# Patient Record
Sex: Male | Born: 1945
Health system: Southern US, Community
[De-identification: ages and names within clinical notes are randomized; demographics above are authoritative.]

## PROBLEM LIST (undated history)

## (undated) DIAGNOSIS — K5790 Diverticulosis of intestine, part unspecified, without perforation or abscess without bleeding: Secondary | ICD-10-CM

## (undated) DIAGNOSIS — K219 Gastro-esophageal reflux disease without esophagitis: Secondary | ICD-10-CM

## (undated) DIAGNOSIS — T7840XA Allergy, unspecified, initial encounter: Secondary | ICD-10-CM

## (undated) DIAGNOSIS — I639 Cerebral infarction, unspecified: Secondary | ICD-10-CM

## (undated) DIAGNOSIS — M720 Palmar fascial fibromatosis [Dupuytren]: Secondary | ICD-10-CM

## (undated) DIAGNOSIS — I471 Supraventricular tachycardia: Secondary | ICD-10-CM

## (undated) DIAGNOSIS — I4719 Other supraventricular tachycardia: Secondary | ICD-10-CM

## (undated) DIAGNOSIS — D369 Benign neoplasm, unspecified site: Secondary | ICD-10-CM

## (undated) DIAGNOSIS — I10 Essential (primary) hypertension: Secondary | ICD-10-CM

## (undated) DIAGNOSIS — M1990 Unspecified osteoarthritis, unspecified site: Secondary | ICD-10-CM

## (undated) HISTORY — DX: Palmar fascial fibromatosis (dupuytren): M72.0

## (undated) HISTORY — DX: Other supraventricular tachycardia: I47.19

## (undated) HISTORY — DX: Allergy, unspecified, initial encounter: T78.40XA

## (undated) HISTORY — DX: Diverticulosis of intestine, part unspecified, without perforation or abscess without bleeding: K57.90

## (undated) HISTORY — PX: POLYPECTOMY: SHX149

## (undated) HISTORY — DX: Supraventricular tachycardia: I47.1

## (undated) HISTORY — DX: Benign neoplasm, unspecified site: D36.9

## (undated) HISTORY — PX: COLON SURGERY: SHX602

## (undated) HISTORY — DX: Unspecified osteoarthritis, unspecified site: M19.90

## (undated) HISTORY — DX: Cerebral infarction, unspecified: I63.9

## (undated) HISTORY — PX: CARDIAC CATHETERIZATION: SHX172

## (undated) HISTORY — PX: BACK SURGERY: SHX140

## (undated) HISTORY — DX: Gastro-esophageal reflux disease without esophagitis: K21.9

## (undated) HISTORY — DX: Essential (primary) hypertension: I10

---

## 2000-09-04 HISTORY — PX: LASIK: SHX215

## 2003-09-24 ENCOUNTER — Encounter: Admission: RE | Admit: 2003-09-24 | Discharge: 2003-09-24 | Payer: Self-pay | Admitting: Family Medicine

## 2004-08-23 ENCOUNTER — Ambulatory Visit: Payer: Self-pay | Admitting: Family Medicine

## 2004-10-19 ENCOUNTER — Ambulatory Visit: Payer: Self-pay | Admitting: Family Medicine

## 2005-07-11 ENCOUNTER — Ambulatory Visit: Payer: Self-pay | Admitting: Family Medicine

## 2005-07-12 ENCOUNTER — Ambulatory Visit: Payer: Self-pay | Admitting: Family Medicine

## 2005-09-19 ENCOUNTER — Ambulatory Visit: Payer: Self-pay | Admitting: Family Medicine

## 2005-10-03 ENCOUNTER — Ambulatory Visit: Payer: Self-pay | Admitting: Family Medicine

## 2006-07-18 ENCOUNTER — Ambulatory Visit: Payer: Self-pay | Admitting: Family Medicine

## 2006-07-18 LAB — CONVERTED CEMR LAB: PSA: 0.68 ng/mL

## 2006-08-02 ENCOUNTER — Ambulatory Visit: Payer: Self-pay | Admitting: Family Medicine

## 2006-08-15 LAB — FECAL OCCULT BLOOD, GUAIAC: Fecal Occult Blood: NEGATIVE

## 2006-08-17 ENCOUNTER — Ambulatory Visit: Payer: Self-pay | Admitting: Family Medicine

## 2006-12-26 ENCOUNTER — Encounter: Payer: Self-pay | Admitting: Family Medicine

## 2007-01-01 DIAGNOSIS — K5790 Diverticulosis of intestine, part unspecified, without perforation or abscess without bleeding: Secondary | ICD-10-CM | POA: Insufficient documentation

## 2007-01-01 DIAGNOSIS — Z8719 Personal history of other diseases of the digestive system: Secondary | ICD-10-CM

## 2007-01-01 DIAGNOSIS — I10 Essential (primary) hypertension: Secondary | ICD-10-CM | POA: Insufficient documentation

## 2007-01-01 DIAGNOSIS — K5730 Diverticulosis of large intestine without perforation or abscess without bleeding: Secondary | ICD-10-CM | POA: Insufficient documentation

## 2007-01-01 DIAGNOSIS — J309 Allergic rhinitis, unspecified: Secondary | ICD-10-CM | POA: Insufficient documentation

## 2007-07-23 ENCOUNTER — Ambulatory Visit: Payer: Self-pay | Admitting: Family Medicine

## 2007-07-24 LAB — CONVERTED CEMR LAB
ALT: 29 units/L (ref 0–53)
AST: 30 units/L (ref 0–37)
Albumin: 4 g/dL (ref 3.5–5.2)
BUN: 13 mg/dL (ref 6–23)
Basophils Relative: 0.9 % (ref 0.0–1.0)
Bilirubin, Direct: 0.1 mg/dL (ref 0.0–0.3)
CO2: 27 meq/L (ref 19–32)
Calcium: 9.2 mg/dL (ref 8.4–10.5)
Chloride: 104 meq/L (ref 96–112)
Cholesterol: 175 mg/dL (ref 0–200)
Creatinine, Ser: 1 mg/dL (ref 0.4–1.5)
Eosinophils Absolute: 0.2 10*3/uL (ref 0.0–0.6)
Eosinophils Relative: 3.2 % (ref 0.0–5.0)
GFR calc Af Amer: 98 mL/min
GFR calc non Af Amer: 81 mL/min
Glucose, Bld: 103 mg/dL — ABNORMAL HIGH (ref 70–99)
HCT: 43.4 % (ref 39.0–52.0)
Hemoglobin: 15.3 g/dL (ref 13.0–17.0)
LDL Cholesterol: 115 mg/dL — ABNORMAL HIGH (ref 0–99)
Lymphocytes Relative: 22.9 % (ref 12.0–46.0)
MCV: 87.9 fL (ref 78.0–100.0)
Monocytes Relative: 10.1 % (ref 3.0–11.0)
Neutro Abs: 3.5 10*3/uL (ref 1.4–7.7)
Neutrophils Relative %: 62.9 % (ref 43.0–77.0)
PSA: 0.64 ng/mL (ref 0.10–4.00)
Platelets: 244 10*3/uL (ref 150–400)
Potassium: 4.4 meq/L (ref 3.5–5.1)
RBC: 4.93 M/uL (ref 4.22–5.81)
RDW: 11.8 % (ref 11.5–14.6)
Sodium: 140 meq/L (ref 135–145)
TSH: 0.52 microintl units/mL (ref 0.35–5.50)
Total Bilirubin: 0.8 mg/dL (ref 0.3–1.2)
Total CHOL/HDL Ratio: 3.5
Total Protein: 6.8 g/dL (ref 6.0–8.3)
Triglycerides: 51 mg/dL (ref 0–149)
VLDL: 10 mg/dL (ref 0–40)
WBC: 5.4 10*3/uL (ref 4.5–10.5)

## 2008-03-02 ENCOUNTER — Ambulatory Visit: Payer: Self-pay | Admitting: Gastroenterology

## 2008-03-16 ENCOUNTER — Ambulatory Visit: Payer: Self-pay | Admitting: Gastroenterology

## 2008-03-16 ENCOUNTER — Encounter: Payer: Self-pay | Admitting: Gastroenterology

## 2008-03-18 ENCOUNTER — Encounter: Payer: Self-pay | Admitting: Gastroenterology

## 2008-04-30 ENCOUNTER — Encounter: Payer: Self-pay | Admitting: Family Medicine

## 2008-07-11 ENCOUNTER — Encounter: Payer: Self-pay | Admitting: Physician Assistant

## 2008-09-04 HISTORY — PX: COLECTOMY: SHX59

## 2008-09-23 ENCOUNTER — Ambulatory Visit: Payer: Self-pay | Admitting: Family Medicine

## 2008-09-23 DIAGNOSIS — K644 Residual hemorrhoidal skin tags: Secondary | ICD-10-CM | POA: Insufficient documentation

## 2008-09-24 LAB — CONVERTED CEMR LAB
ALT: 26 units/L (ref 0–53)
AST: 23 units/L (ref 0–37)
Albumin: 4.2 g/dL (ref 3.5–5.2)
Alkaline Phosphatase: 63 units/L (ref 39–117)
BUN: 15 mg/dL (ref 6–23)
Basophils Relative: 0.8 % (ref 0.0–3.0)
Bilirubin, Direct: 0.1 mg/dL (ref 0.0–0.3)
CO2: 31 meq/L (ref 19–32)
Calcium: 9.4 mg/dL (ref 8.4–10.5)
Chloride: 106 meq/L (ref 96–112)
Cholesterol: 180 mg/dL (ref 0–200)
Creatinine, Ser: 0.9 mg/dL (ref 0.4–1.5)
Eosinophils Relative: 5 % (ref 0.0–5.0)
GFR calc Af Amer: 110 mL/min
GFR calc non Af Amer: 91 mL/min
Glucose, Bld: 97 mg/dL (ref 70–99)
HCT: 44.3 % (ref 39.0–52.0)
HDL: 48.2 mg/dL (ref >39.0)
Hemoglobin: 15.5 g/dL (ref 13.0–17.0)
LDL Cholesterol: 118 mg/dL — ABNORMAL HIGH (ref 0–99)
Lymphocytes Relative: 26.4 % (ref 12.0–46.0)
Monocytes Absolute: 0.6 10*3/uL (ref 0.1–1.0)
Monocytes Relative: 10.3 % (ref 3.0–12.0)
PSA: 0.63 ng/mL (ref 0.10–4.00)
Platelets: 229 10*3/uL (ref 150–400)
Potassium: 4.3 meq/L (ref 3.5–5.1)
RDW: 12.2 % (ref 11.5–14.6)
Sodium: 140 meq/L (ref 135–145)
Total Bilirubin: 1 mg/dL (ref 0.3–1.2)
Total CHOL/HDL Ratio: 3.7
Total Protein: 7.2 g/dL (ref 6.0–8.3)
Triglycerides: 67 mg/dL (ref 0–149)
VLDL: 13 mg/dL (ref 0–40)
WBC: 5.6 10*3/uL (ref 4.5–10.5)

## 2008-10-26 ENCOUNTER — Ambulatory Visit: Payer: Self-pay | Admitting: Gastroenterology

## 2008-10-26 ENCOUNTER — Encounter (INDEPENDENT_AMBULATORY_CARE_PROVIDER_SITE_OTHER): Payer: Self-pay | Admitting: *Deleted

## 2008-10-26 ENCOUNTER — Telehealth: Payer: Self-pay | Admitting: Gastroenterology

## 2008-10-26 DIAGNOSIS — Z860100 Personal history of colon polyps, unspecified: Secondary | ICD-10-CM | POA: Insufficient documentation

## 2008-10-26 LAB — CONVERTED CEMR LAB
Basophils Absolute: 0.3 10*3/uL — ABNORMAL HIGH (ref 0.0–0.1)
Basophils Relative: 3.2 % — ABNORMAL HIGH (ref 0.0–3.0)
Eosinophils Absolute: 0.2 10*3/uL (ref 0.0–0.7)
Eosinophils Relative: 3 % (ref 0.0–5.0)
HCT: 45.6 % (ref 39.0–52.0)
Hemoglobin: 15.9 g/dL (ref 13.0–17.0)
Lymphocytes Relative: 20.2 % (ref 12.0–46.0)
MCHC: 34.8 g/dL (ref 30.0–36.0)
MCV: 90.4 fL (ref 78.0–100.0)
Monocytes Absolute: 0.7 10*3/uL (ref 0.1–1.0)
Neutro Abs: 5.2 10*3/uL (ref 1.4–7.7)
Platelets: 225 10*3/uL (ref 150–400)
RBC: 5.04 M/uL (ref 4.22–5.81)
RDW: 12.4 % (ref 11.5–14.6)

## 2008-11-16 ENCOUNTER — Ambulatory Visit: Payer: Self-pay | Admitting: Gastroenterology

## 2009-01-04 ENCOUNTER — Ambulatory Visit: Payer: Self-pay | Admitting: Gastroenterology

## 2009-01-04 ENCOUNTER — Ambulatory Visit: Payer: Self-pay | Admitting: Cardiology

## 2009-01-04 ENCOUNTER — Telehealth: Payer: Self-pay | Admitting: Gastroenterology

## 2009-01-04 LAB — CONVERTED CEMR LAB
BUN: 12 mg/dL (ref 6–23)
Creatinine, Ser: 1 mg/dL (ref 0.4–1.5)

## 2009-01-08 ENCOUNTER — Encounter: Payer: Self-pay | Admitting: Family Medicine

## 2009-01-14 ENCOUNTER — Telehealth: Payer: Self-pay | Admitting: Gastroenterology

## 2009-02-03 ENCOUNTER — Encounter: Admission: RE | Admit: 2009-02-03 | Discharge: 2009-02-03 | Payer: Self-pay | Admitting: Family Medicine

## 2009-02-25 ENCOUNTER — Ambulatory Visit: Payer: Self-pay | Admitting: Gastroenterology

## 2009-03-31 ENCOUNTER — Encounter: Payer: Self-pay | Admitting: Gastroenterology

## 2009-04-06 ENCOUNTER — Encounter: Admission: RE | Admit: 2009-04-06 | Discharge: 2009-04-06 | Payer: Self-pay | Admitting: General Surgery

## 2009-04-20 ENCOUNTER — Encounter: Payer: Self-pay | Admitting: Family Medicine

## 2009-06-07 ENCOUNTER — Telehealth: Payer: Self-pay | Admitting: Gastroenterology

## 2009-07-06 ENCOUNTER — Encounter: Payer: Self-pay | Admitting: Family Medicine

## 2009-07-07 ENCOUNTER — Encounter: Admission: RE | Admit: 2009-07-07 | Discharge: 2009-07-07 | Payer: Self-pay | Admitting: General Surgery

## 2009-07-16 ENCOUNTER — Encounter: Payer: Self-pay | Admitting: Family Medicine

## 2009-08-03 ENCOUNTER — Encounter: Payer: Self-pay | Admitting: Gastroenterology

## 2009-08-03 ENCOUNTER — Inpatient Hospital Stay (HOSPITAL_COMMUNITY): Admission: RE | Admit: 2009-08-03 | Discharge: 2009-08-08 | Payer: Self-pay | Admitting: General Surgery

## 2009-08-03 ENCOUNTER — Encounter (INDEPENDENT_AMBULATORY_CARE_PROVIDER_SITE_OTHER): Payer: Self-pay | Admitting: General Surgery

## 2009-08-11 ENCOUNTER — Encounter: Payer: Self-pay | Admitting: Gastroenterology

## 2009-09-06 ENCOUNTER — Encounter: Payer: Self-pay | Admitting: Family Medicine

## 2009-09-24 ENCOUNTER — Ambulatory Visit: Admission: RE | Admit: 2009-09-24 | Discharge: 2009-09-24 | Payer: Self-pay | Admitting: Family Medicine

## 2009-09-24 ENCOUNTER — Ambulatory Visit: Payer: Self-pay | Admitting: Vascular Surgery

## 2009-09-24 ENCOUNTER — Encounter: Payer: Self-pay | Admitting: Family Medicine

## 2009-10-13 ENCOUNTER — Inpatient Hospital Stay (HOSPITAL_COMMUNITY): Admission: EM | Admit: 2009-10-13 | Discharge: 2009-10-17 | Payer: Self-pay | Admitting: Emergency Medicine

## 2009-10-28 ENCOUNTER — Ambulatory Visit: Payer: Self-pay | Admitting: Family Medicine

## 2009-10-28 DIAGNOSIS — N509 Disorder of male genital organs, unspecified: Secondary | ICD-10-CM | POA: Insufficient documentation

## 2009-11-04 ENCOUNTER — Encounter: Payer: Self-pay | Admitting: Family Medicine

## 2009-11-26 ENCOUNTER — Ambulatory Visit: Payer: Self-pay | Admitting: Family Medicine

## 2009-11-26 DIAGNOSIS — M79609 Pain in unspecified limb: Secondary | ICD-10-CM | POA: Insufficient documentation

## 2009-11-26 DIAGNOSIS — M25519 Pain in unspecified shoulder: Secondary | ICD-10-CM | POA: Insufficient documentation

## 2009-11-29 ENCOUNTER — Encounter: Admission: RE | Admit: 2009-11-29 | Discharge: 2009-11-29 | Payer: Self-pay | Admitting: Family Medicine

## 2009-11-29 LAB — CONVERTED CEMR LAB
ALT: 21 units/L (ref 0–53)
AST: 20 units/L (ref 0–37)
Albumin: 4.1 g/dL (ref 3.5–5.2)
Alkaline Phosphatase: 66 units/L (ref 39–117)
BUN: 14 mg/dL (ref 6–23)
Basophils Absolute: 0.1 10*3/uL (ref 0.0–0.1)
Basophils Relative: 1.3 % (ref 0.0–3.0)
Bilirubin, Direct: 0.1 mg/dL (ref 0.0–0.3)
CO2: 30 meq/L (ref 19–32)
Calcium: 9.4 mg/dL (ref 8.4–10.5)
Chloride: 108 meq/L (ref 96–112)
Cholesterol: 195 mg/dL (ref 0–200)
Creatinine, Ser: 0.9 mg/dL (ref 0.4–1.5)
Eosinophils Absolute: 0.2 10*3/uL (ref 0.0–0.7)
Eosinophils Relative: 3.9 % (ref 0.0–5.0)
GFR calc non Af Amer: 90.85 mL/min (ref >60)
Glucose, Bld: 91 mg/dL (ref 70–99)
HCT: 44.8 % (ref 39.0–52.0)
HDL: 58 mg/dL (ref >39.00)
Hemoglobin: 14.7 g/dL (ref 13.0–17.0)
LDL Cholesterol: 121 mg/dL — ABNORMAL HIGH (ref 0–99)
Lymphocytes Relative: 28.5 % (ref 12.0–46.0)
Lymphs Abs: 1.5 10*3/uL (ref 0.7–4.0)
MCHC: 32.9 g/dL (ref 30.0–36.0)
MCV: 91.6 fL (ref 78.0–100.0)
Monocytes Absolute: 0.4 10*3/uL (ref 0.1–1.0)
Monocytes Relative: 8.3 % (ref 3.0–12.0)
Neutro Abs: 3 10*3/uL (ref 1.4–7.7)
Neutrophils Relative %: 58 % (ref 43.0–77.0)
PSA: 0.91 ng/mL (ref 0.10–4.00)
Platelets: 300 10*3/uL (ref 150.0–400.0)
Potassium: 4.6 meq/L (ref 3.5–5.1)
RBC: 4.89 M/uL (ref 4.22–5.81)
RDW: 11.8 % (ref 11.5–14.6)
Sodium: 141 meq/L (ref 135–145)
TSH: 0.66 microintl units/mL (ref 0.35–5.50)
Total Bilirubin: 0.3 mg/dL (ref 0.3–1.2)
Total CHOL/HDL Ratio: 3
Total Protein: 7.1 g/dL (ref 6.0–8.3)
Triglycerides: 82 mg/dL (ref 0.0–149.0)
VLDL: 16.4 mg/dL (ref 0.0–40.0)
WBC: 5.2 10*3/uL (ref 4.5–10.5)

## 2009-11-30 ENCOUNTER — Telehealth: Payer: Self-pay | Admitting: Family Medicine

## 2009-12-06 ENCOUNTER — Encounter: Payer: Self-pay | Admitting: Family Medicine

## 2009-12-30 ENCOUNTER — Encounter: Payer: Self-pay | Admitting: Gastroenterology

## 2010-02-03 ENCOUNTER — Telehealth: Payer: Self-pay | Admitting: Family Medicine

## 2010-02-03 ENCOUNTER — Encounter: Payer: Self-pay | Admitting: Family Medicine

## 2010-02-11 ENCOUNTER — Telehealth: Payer: Self-pay | Admitting: Family Medicine

## 2010-03-09 ENCOUNTER — Ambulatory Visit: Payer: Self-pay | Admitting: Family Medicine

## 2010-03-18 ENCOUNTER — Ambulatory Visit: Payer: Self-pay | Admitting: Family Medicine

## 2010-03-18 DIAGNOSIS — L259 Unspecified contact dermatitis, unspecified cause: Secondary | ICD-10-CM | POA: Insufficient documentation

## 2010-03-23 ENCOUNTER — Telehealth: Payer: Self-pay | Admitting: Family Medicine

## 2010-10-06 NOTE — Progress Notes (Signed)
Summary: Blood pressure issues..wants  to change meds   Phone Note Call from Patient   Caller: Patient Call For: Robert Page Summary of Call: Pt called, Says he is presently on Losartan for his blood pressure. Before changing he was on Benicar. He needs a precription for Benicare, because the Losartan  is no longer  working. Pt had no b/p reading , says he has not taken his blood pressure since his last visit in the office.  Told pt the nurse would be in contact w/ him. Call back # [phone].Daine Gip  March 23, 2010 12:48 PM  Initial call taken by: Daine Gip,  March 23, 2010 12:48 PM  Follow-up for Phone Call        will go back to benicar px written on EMR for call in  Follow-up by: Robert Page,  March 23, 2010 1:36 PM  Additional Follow-up for Phone Call Additional follow up Details #1::        Advised pt, med sent to pharmacy. Additional Follow-up by: Lowella Petties CMA,  March 23, 2010 4:17 PM   New Allergies: ! * LOSARTAN New Allergies: ! * LOSARTANPrescriptions: BENICAR 20 MG TABS (OLMESARTAN MEDOXOMIL) Take one by mouth daily  #30 x 11   Entered by:   Lowella Petties CMA   Authorized by:   Robert Page   Signed by:   Lowella Petties CMA on 03/23/2010   Method used:   [facility]        [id number]       [address]       [place], [place]  [address]       Ph: [phone]       Fax: [phone]   RxID:   [id number]

## 2010-10-06 NOTE — Miscellaneous (Signed)
Summary: Losartan50mg  updated instructions on med list  Medications Added LOSARTAN POTASSIUM 50 MG TABS (LOSARTAN POTASSIUM) Take 2  tablets  by mouth once a day       Clinical Lists Changes  Medications: Changed medication from LOSARTAN POTASSIUM 50 MG TABS (LOSARTAN POTASSIUM) Take 1 tablet by mouth once a day to LOSARTAN POTASSIUM 50 MG TABS (LOSARTAN POTASSIUM) Take 2  tablets  by mouth once a day     Current Allergies: ! SULFA ! MOTRIN ! * ALTASE

## 2010-10-06 NOTE — Progress Notes (Signed)
Summary: Lower Cost Alternative Rx.  Phone Note From Pharmacy   [id number] Phone  [phone] Call For: Dr. Milinda Antis  Summary of Call: Form received asking for a lower cost alternative to Benicar 20 mg. once daily.  Pharmacy recommends Losartan Potassium 25, 50 or 100 mg.  Form in your in box.  [facility] [address]  Fax:   [phone] Initial call taken by: Delilah Shan CMA Duncan Dull),  February 03, 2010 9:02 AM  Follow-up for Phone Call        I am fine with change to losartan 50 -- only if pt wants to do it  sched nurse visit for bp check 2 wk after starting it to make sure it is working  if he is agreeable --please change this on EMR form done and in nurse in box  Follow-up by: Judith Part MD,  February 03, 2010 12:33 PM  Additional Follow-up for Phone Call Additional follow up Details #1::        Patient notified as instructed by telephone. Pt wants to try since will save money. Pt scheduled nurse visit BP check on 03/09/10 at 8:30. Pt still has 2 weeks of Benicar he will take before starting new med. completed form faxed to [phone]. Med list updated as instructed.Lewanda Rife LPN  February 04, 8656 12:49 PM

## 2010-10-06 NOTE — Assessment & Plan Note (Signed)
Summary: BP CHECK PER DR Malina Geers/RI   Nurse Visit   Vital Signs:  Patient profile:   66 year old male Weight:      202 pounds Pulse rate:   64 / minute Pulse rhythm:   regular BP sitting:   150 / 100  (right arm)  Vitals Entered By: Lowella Petties CMA (March 09, 2010 9:01 AM) CC: Nurse visit-  BP check.  Pt has been on benicar for 3 weeks.   Allergies: 1)  ! Sulfa 2)  ! Motrin 3)  ! * Altase  Orders Added: 1)  Est. Patient Level I [[address]]  Appended Document: BP CHECK PER DR Mahaley Schwering/RI noted that pt had been on benicar for 3 weeks -- did you mean off benecar and on cozaar- this visit was to check bp after switching to cozaar which is less expensive-- please update me   Appended Document: BP CHECK PER DR Rachelann Enloe/[place] Pt states he started losartan about 3 weeks ago, when he stopped benicar.  Appended Document: BP CHECK PER DR Malaquias Lenker/RI it is not working as well so up the dose to 100 mg (2 pills once daily) and re check bp in nurse visit in 1-2 weeks -- if not improved may need to go with something else (sadly sometimes the cheaper alternative just does not cut it) please note dose change on EMR thanks for the update  Appended Document: BP CHECK PER DR Oza Oberle/RI Patient notified as instructed by telephone. EMR updated with Losartan directions as instructed. Pt is outside and will call back to schedule nurse visit for BP.

## 2010-10-06 NOTE — Assessment & Plan Note (Signed)
Summary: CPX/CLE   Vital Signs:  Patient profile:   65 year old male Height:      68 inches Weight:      201 pounds BMI:     30.67 Temp:     97.9 degrees F oral Pulse rate:   64 / minute Pulse rhythm:   regular BP sitting:   120 / 74  (left arm) Cuff size:   regular  Vitals Entered By: Lewanda Rife LPN (October 28, 2009 9:24 AM)  History of Present Illness: here for health mt exam and to rev chronic med problems   wt is down 2 lb with bmi 30 started back eating - had lost 20 lb in the hospital  he missed his yearly trip to [place]   had recent hosp - colectomy for diverticulosis and then infarcted  appendix epiploica unlikely diverticulitis  is doing better now  goes back to Dr Derrell Lolling on march 4th -- may still go ahead and re image is generally feeling good today   sinuses act up if he stops his allegra - needs refil   had zoster in past  TD 05   colonosc 7/09- polyp rec f/u 5 y   psa and other labs are due  bp is good today 120/74  prostate-- no problems  some testicular pain ever since his surgery  is generally getting better  no bulge or hernia known  no trouble urinating at all  nocturia once per night maximum - sometimes none  does not want dre yet today until he is healed a bit more from surgery- rectum is still sore   is getting more active   derm visit this summer - all moles removed were ok   Allergies: 1)  ! Sulfa 2)  ! Motrin 3)  ! Lucila Maine  Past History:  Past Medical History: Last updated: 08/03/2009 Allergic rhinitis Diverticulitis, hx of Diverticulosis, colon Hypertension RLL infiltrate Dupuytren's dz- palms and soles ADENOMATOUS POLYP   derm- [facility]-- surg -  Dr Derrell Lolling  Past Surgical History: Last updated: 08/03/2009 00 flex sig 02 lasik eye sx 04 colonosc diverticulosis 1/07 zoster 7/09 colonoscopy polyp/diverticulosis  11/10 L colectomy for diverticulitis   Family History: Last updated: 10/28/2009 sister with  melanoma parents HTN mother macular degeneration uncle with CVA uncle prostate ca uncle colon ca  uncle lung ca GM colon ca cousin stomach ca Family History of Kidney Disease: Grandmother, Mother Family History of Heart Disease: Cousin, Aunt, Uncle Family History of Liver Cancer: Uncle (mets) sister's grandaughter has sarcoma   Social History: Last updated: 02/25/2009 Former Smoker-stopped 6 months ago occ cigars- not often goes on mission trips to [place] Alcohol Use - no Daily Caffeine Use-2 cups daily Illicit Drug Use - no Patient gets regular exercise.  Risk Factors: Exercise: yes (02/25/2009)  Risk Factors: Smoking Status: quit (12/26/2006)  Family History: sister with melanoma parents HTN mother macular degeneration uncle with CVA uncle prostate ca uncle colon ca  uncle lung ca GM colon ca cousin stomach ca Family History of Kidney Disease: Grandmother, Mother Family History of Heart Disease: Cousin, Aunt, Uncle Family History of Liver Cancer: Uncle (mets) sister's grandaughter has sarcoma   Review of Systems General:  Denies fatigue, fever, loss of appetite, and malaise. Eyes:  Denies blurring and eye irritation. CV:  Denies chest pain or discomfort and lightheadness. Resp:  Denies cough, shortness of breath, and wheezing. GI:  Denies abdominal pain, bloody stools, change in bowel habits, indigestion, nausea, and  vomiting. GU:  Denies discharge, dysuria, hematuria, and urinary frequency. MS:  Denies joint pain. Derm:  Denies itching, lesion(s), poor wound healing, and rash. Neuro:  Denies numbness and tingling. Psych:  mood is generally ok . Endo:  Denies cold intolerance, excessive thirst, excessive urination, and heat intolerance. Heme:  Denies abnormal bruising and bleeding.  Physical Exam  General:  Well-developed,well-nourished,in no acute distress; alert,appropriate and cooperative throughout examination Head:  normocephalic, atraumatic, and  no abnormalities observed.   Eyes:  vision grossly intact, pupils equal, pupils round, and pupils reactive to light.  no conjunctival pallor, injection or icterus  Ears:  R ear normal and L ear normal.  - scant cerumen Nose:  no nasal discharge.   Mouth:  pharynx pink and moist.   Neck:  supple with full rom and no masses or thyromegally, no JVD or carotid bruit  Chest Wall:  No deformities, masses, tenderness or gynecomastia noted. Lungs:  Normal respiratory effort, chest expands symmetrically. Lungs are clear to auscultation, no crackles or wheezes. Heart:  Normal rate and regular rhythm. S1 and S2 normal without gallop, murmur, click, rub or other extra sounds. Abdomen:  midline vertical incision - slt tender overall soft no rebound or gaurding  Rectal:  def today due to recent healing rectal incision  Genitalia:  Testes bilaterally descended without nodularity, tenderness or masses. No scrotal masses or lesions. No penis lesions or urethral discharge. no hernias noted  Skin:  Intact without suspicious lesions or rashes Cervical Nodes:  No lymphadenopathy noted Inguinal Nodes:  No significant adenopathy Psych:  normal affect, talkative and pleasant    Impression & Recommendations:  Problem # 1:  HEALTH MAINTENANCE EXAM (ICD-V70.0) Assessment Comment Only reviewed health habits including diet, exercise and skin cancer prevention reviewed health maintenance list and family history labs today  def rectal today due to recent surgery and healing Orders: Venipuncture ([address]) TLB-Lipid Panel (80061-LIPID) TLB-BMP (Basic Metabolic Panel-BMET) (80048-METABOL) TLB-CBC Platelet - w/Differential (85025-CBCD) TLB-Hepatic/Liver Function Pnl (80076-HEPATIC) TLB-TSH (Thyroid Stimulating Hormone) (84443-TSH) TLB-PSA (Prostate Specific Antigen) (84153-PSA)  Problem # 2:  HYPERTENSION (ICD-401.9) Assessment: Unchanged  bp is in good control with benicar  gradually adv exercise as tol no  change  lab today His updated medication list for this problem includes:    Benicar 20 Mg Tabs (Olmesartan medoxomil) .Marland Kitchen... Take one by mouth daily  Orders: Venipuncture ([address]) TLB-Lipid Panel (80061-LIPID) TLB-BMP (Basic Metabolic Panel-BMET) (80048-METABOL) TLB-CBC Platelet - w/Differential (85025-CBCD) TLB-Hepatic/Liver Function Pnl (80076-HEPATIC) TLB-TSH (Thyroid Stimulating Hormone) (84443-TSH) TLB-PSA (Prostate Specific Antigen) (84153-PSA)  BP today: 120/74 Prior BP: 114/68 (02/25/2009)  Labs Reviewed: K+: 4.3 (09/23/2008) Creat: : 1.0 (01/04/2009)   Chol: 180 (09/23/2008)   HDL: 48.2 (09/23/2008)   LDL: 118 (09/23/2008)   TG: 67 (09/23/2008)  Problem # 3:  DIVERTICULOSIS, COLON (ICD-562.10) Assessment: Comment Only imp now s/p colectomy - for surg f/u soon  Problem # 4:  TESTICULAR PAIN (ICD-608.9) Assessment: New ever since his GI surg -now gradually improved  no hernia noted - nl exam  suspect some referred pain - but will watch carefully  Problem # 5:  SPECIAL SCREENING MALIGNANT NEOPLASM OF PROSTATE (ICD-V76.44) Assessment: Comment Only psa today  will put off dre Orders: Venipuncture (09811) TLB-Lipid Panel (80061-LIPID) TLB-BMP (Basic Metabolic Panel-BMET) (80048-METABOL) TLB-CBC Platelet - w/Differential (85025-CBCD) TLB-Hepatic/Liver Function Pnl (80076-HEPATIC) TLB-TSH (Thyroid Stimulating Hormone) (84443-TSH) TLB-PSA (Prostate Specific Antigen) (84153-PSA)  Complete Medication List: 1)  Benicar 20 Mg Tabs (Olmesartan medoxomil) .... Take one by mouth daily 2)  Allegra 180 Mg Tabs (Fexofenadine hcl) .... Take one by mouth daily as needed 3)  Anusol-hc 2.5 % Crea (Hydrocortisone) .... Use as needed 4)  Aleve 220 Mg Tabs (Naproxen sodium) .... Otc as directed.  Patient Instructions: 1)  gradually get back to regular activity and high fiber diet and good water intake  2)  no change in medicines 3)  labs today  4)  update me if testicular pain does  not further improve in a month  Prescriptions: ALLEGRA 180 MG TABS (FEXOFENADINE HCL) Take one by mouth daily as needed  #90 x 3   Entered and Authorized by:   Judith Part MD   Signed by:   Judith Part MD on 10/28/2009   Method used:   Print then Give to Patient   RxID:   [id number] BENICAR 20 MG TABS (OLMESARTAN MEDOXOMIL) Take one by mouth daily  #90 x 3   Entered and Authorized by:   Judith Part MD   Signed by:   Judith Part MD on 10/28/2009   Method used:   Print then Give to Patient   RxID:   [id number]   Current Allergies (reviewed today): ! SULFA ! MOTRIN ! * ALTASE

## 2010-10-06 NOTE — Letter (Signed)
Summary: [facility]   Imported By: Lanelle Bal 11/16/2009 08:17:10  _____________________________________________________________________  External Attachment:    Type:   Image     Comment:   External Document

## 2010-10-06 NOTE — Letter (Signed)
Summary: [facility]   Imported By: Sherian Rein 01/11/2010 09:39:42  _____________________________________________________________________  External Attachment:    Type:   Image     Comment:   External Document

## 2010-10-06 NOTE — Assessment & Plan Note (Signed)
Summary: hand pain/nt   Vital Signs:  Patient profile:   65 year old male Height:      68 inches Weight:      205.75 pounds BMI:     31.40 Temp:     97.8 degrees F oral Pulse rate:   72 / minute Pulse rhythm:   regular BP sitting:   132 / 90  (left arm) Cuff size:   regular  Vitals Entered By: Linde Gillis CMA Duncan Dull) (November 26, 2009 11:21 AM) CC: hand pain   History of Present Illness: has always had trouble gripping with L hand  now much worse -- cannot close in ams  R is getting worse too  joints in it really hurt and are very stiff worse after driving or inactivity (and in the ams)  is also having pain in L shoulder -- after swinging over his head -- is sore and tender to the touch   no other arthritis known - but suspects it  runs in family no rhrum dz in family   has taken some aleve- does not help a whole lot -- 2 pills daily    Allergies: 1)  ! Sulfa 2)  ! Motrin 3)  ! Lucila Maine  Past History:  Past Medical History: Last updated: 08/03/2009 Allergic rhinitis Diverticulitis, hx of Diverticulosis, colon Hypertension RLL infiltrate Dupuytren's dz- palms and soles ADENOMATOUS POLYP   derm- [facility]-- surg -  Dr Derrell Lolling  Past Surgical History: Last updated: 08/03/2009 00 flex sig 02 lasik eye sx 04 colonosc diverticulosis 1/07 zoster 7/09 colonoscopy polyp/diverticulosis  11/10 L colectomy for diverticulitis   Family History: Last updated: 11/26/2009 sister with melanoma parents HTN mother macular degeneration brother - inflammatory lung and eye condtion  uncle with CVA uncle prostate ca uncle colon ca  uncle lung ca GM colon ca cousin stomach ca Family History of Kidney Disease: Grandmother, Mother Family History of Heart Disease: Cousin, Aunt, Uncle Family History of Liver Cancer: Uncle (mets) sister's grandaughter has sarcoma   Social History: Last updated: 02/25/2009 Former Smoker-stopped 6 months ago occ cigars- not  often goes on mission trips to [place] Alcohol Use - no Daily Caffeine Use-2 cups daily Illicit Drug Use - no Patient gets regular exercise.  Risk Factors: Exercise: yes (02/25/2009)  Risk Factors: Smoking Status: quit (12/26/2006)  Family History: sister with melanoma parents HTN mother macular degeneration brother - inflammatory lung and eye condtion  uncle with CVA uncle prostate ca uncle colon ca  uncle lung ca GM colon ca cousin stomach ca Family History of Kidney Disease: Grandmother, Mother Family History of Heart Disease: Cousin, Aunt, Uncle Family History of Liver Cancer: Uncle (mets) sister's grandaughter has sarcoma   Review of Systems General:  Denies chills, fatigue, fever, and malaise. Eyes:  Denies blurring and eye irritation. CV:  Denies chest pain or discomfort, lightheadness, palpitations, and shortness of breath with exertion. Resp:  Denies cough and wheezing. GI:  Denies abdominal pain and change in bowel habits. GU:  Denies discharge and dysuria. MS:  Complains of joint pain and stiffness; denies joint redness, joint swelling, muscle aches, cramps, and muscle weakness. Derm:  Denies itching, lesion(s), poor wound healing, and rash. Neuro:  Denies numbness and tingling. Heme:  Denies abnormal bruising and bleeding.  Physical Exam  General:  Well-developed,well-nourished,in no acute distress; alert,appropriate and cooperative throughout examination Head:  normocephalic, atraumatic, and no abnormalities observed.   Eyes:  vision grossly intact, pupils equal, pupils round, and pupils reactive to  light.   Mouth:  pharynx pink and moist.   Neck:  No deformities, masses, or tenderness noted. nl rom , no bony tenderness or crepitice  Chest Wall:  mildly tender L ant upper chest wall  Lungs:  Normal respiratory effort, chest expands symmetrically. Lungs are clear to auscultation, no crackles or wheezes. Heart:  Normal rate and regular rhythm. S1 and S2  normal without gallop, murmur, click, rub or other extra sounds. Msk:  L shoulder - full rom  some ant pain on hawking's and neer tests  tender acromion and deltoid areas  no swelling or deformity   hands- no deformity some tederness of mcps on L hand only nl grip with pain  no locking or triggering nl perfusion and sensation Extremities:  No clubbing, cyanosis, edema, or deformity noted with normal full range of motion of all joints.   Neurologic:  sensation intact to light touch, gait normal, and DTRs symmetrical and normal.   Skin:  Intact without suspicious lesions or rashes ruddy complexion Cervical Nodes:  No lymphadenopathy noted Psych:  normal affect, talkative and pleasant    Impression & Recommendations:  Problem # 1:  HAND PAIN, BILATERAL (ICD-729.5) Assessment New suspect osteoarthritis will try mobic 15 mg - watching closely for GI upset or bp elevation adv to take with food  disc use of warm water/ biofreeze and hand stretching  sent for x rays and then will make further plan  Orders: Radiology Referral (Radiology) Prescription Created Electronically [phone])  Problem # 2:  SHOULDER PAIN, LEFT (ICD-719.41) Assessment: New  acute after swinging an axe no impingement symptoms  recommend warm compress/ passive rom and mobic update 1 week if not imp relative rest- avoid over the head activities and heavy lifting with that arm  The following medications were removed from the medication list:    Aleve 220 Mg Tabs (Naproxen sodium) ..... Otc as directed. His updated medication list for this problem includes:    Mobic 15 Mg Tabs (Meloxicam) .Marland Kitchen... 1 by mouth once daily with food ( a meal ) as needed hand pain  Orders: Prescription Created Electronically [id number])  Complete Medication List: 1)  Benicar 20 Mg Tabs (Olmesartan medoxomil) .... Take one by mouth daily 2)  Allegra 180 Mg Tabs (Fexofenadine hcl) .... Take one by mouth daily as needed 3)  Mobic 15 Mg Tabs  (Meloxicam) .Marland Kitchen.. 1 by mouth once daily with food ( a meal ) as needed hand pain  Patient Instructions: 1)  we will send you for hand x rays at check out  2)  start mobic 15 mg daily with meal -- if any stomach upset stop it and call me  3)  use warm compress on shoulder area  4)  try warm or hot water soaks on hands when stiff 5)  biofreeze is ok  Prescriptions: MOBIC 15 MG TABS (MELOXICAM) 1 by mouth once daily with food ( a meal ) as needed hand pain  #30 x 3   Entered and Authorized by:   Judith Part MD   Signed by:   Judith Part MD on 11/26/2009   Method used:   [facility]        [facility] [address] [id number]       [address]       [place], [place]  [address]       Ph: [phone]  Fax: [phone]   RxID:   [id number]   Current Allergies (reviewed today): ! SULFA ! MOTRIN ! * ALTASE

## 2010-10-06 NOTE — Miscellaneous (Signed)
Summary: Losartan Potassium 50mg  rx updated med list  Medications Added LOSARTAN POTASSIUM 50 MG TABS (LOSARTAN POTASSIUM) Take 1 tablet by mouth once a day       Clinical Lists Changes  Medications: Added new medication of LOSARTAN POTASSIUM 50 MG TABS (LOSARTAN POTASSIUM) Take 1 tablet by mouth once a day     Current Allergies: ! SULFA ! MOTRIN ! * ALTASE

## 2010-10-06 NOTE — Assessment & Plan Note (Signed)
Summary: CONGESTION/COUGHING/DLO   Vital Signs:  Patient profile:   65 year old male Height:      68 inches Weight:      204.25 pounds BMI:     31.17 Temp:     97.9 degrees F oral Pulse rate:   64 / minute Pulse rhythm:   regular BP sitting:   154 / 94  (left arm) Cuff size:   regular  Vitals Entered By: Lewanda Rife LPN (March 18, 2010 11:46 AM) CC: BP check and head congested with pain in lt upper jaw, has sinus problems non productive cough and rash on stomach.   History of Present Illness: started getting respiratory symptoms - about 10 days ago - was in the mts  ever since then has had congestion  last week L side of teeth hurt (did see dentist )  coughing and congested  non prod  no colored nasal d/c   tried allegra D for allergies  has always taken the D and never had any problems with that with bp  changed to losartan for cost - bp is high was good on benicar   rash on his abdomen -- is better than it was itches like crazy  putting calamine on it  rash is not hurting      Allergies: 1)  ! Sulfa 2)  ! Motrin 3)  ! Lucila Maine  Past History:  Past Medical History: Last updated: 08/03/2009 Allergic rhinitis Diverticulitis, hx of Diverticulosis, colon Hypertension RLL infiltrate Dupuytren's dz- palms and soles ADENOMATOUS POLYP   derm- [facility]-- surg -  Dr Derrell Lolling  Past Surgical History: Last updated: 08/03/2009 00 flex sig 02 lasik eye sx 04 colonosc diverticulosis 1/07 zoster 7/09 colonoscopy polyp/diverticulosis  11/10 L colectomy for diverticulitis   Family History: Last updated: 11/26/2009 sister with melanoma parents HTN mother macular degeneration brother - inflammatory lung and eye condtion  uncle with CVA uncle prostate ca uncle colon ca  uncle lung ca GM colon ca cousin stomach ca Family History of Kidney Disease: Grandmother, Mother Family History of Heart Disease: Cousin, Aunt, Uncle Family History of Liver Cancer: Uncle  (mets) sister's grandaughter has sarcoma   Social History: Last updated: 02/25/2009 Former Smoker-stopped 6 months ago occ cigars- not often goes on mission trips to [place] Alcohol Use - no Daily Caffeine Use-2 cups daily Illicit Drug Use - no Patient gets regular exercise.  Risk Factors: Exercise: yes (02/25/2009)  Risk Factors: Smoking Status: quit (12/26/2006)  Review of Systems General:  Complains of fatigue; denies chills and fever. Eyes:  Denies blurring and eye irritation. ENT:  Complains of earache, nasal congestion, sinus pressure, and sore throat; denies ear discharge. CV:  Denies chest pain or discomfort and palpitations. Resp:  Complains of cough; denies wheezing. GI:  Denies abdominal pain, nausea, and vomiting. MS:  Denies cramps and muscle weakness. Derm:  Complains of itching and rash. Neuro:  Denies numbness and tingling. Endo:  Denies cold intolerance, excessive thirst, excessive urination, and heat intolerance. Heme:  Denies abnormal bruising and bleeding.  Physical Exam  General:  Well-developed,well-nourished,in no acute distress; alert,appropriate and cooperative throughout examination Head:  mild max sinus tenderness normocephalic, atraumatic, and no abnormalities observed.   Eyes:  vision grossly intact, pupils equal, pupils round, pupils reactive to light, and no injection.   Ears:  R ear normal and L ear normal.   Nose:  nares are injected and congested bilaterally  Mouth:  pharynx pink and moist, no erythema, and no  exudates.   Neck:  supple with full rom and no masses or thyromegally, no JVD or carotid bruit  Chest Wall:  No deformities, masses, tenderness or gynecomastia noted. Lungs:  harsh bs at bases no rales  few rhonchi Heart:  Normal rate and regular rhythm. S1 and S2 normal without gallop, murmur, click, rub or other extra sounds. Abdomen:  soft and non-tender.  see skin exam Msk:  No deformity or scoliosis noted of thoracic or  lumbar spine.   Extremities:  No clubbing, cyanosis, edema, or deformity noted with normal full range of motion of all joints.   Neurologic:  sensation intact to light touch, gait normal, and DTRs symmetrical and normal.   Skin:  several patches of vesicles on mid abdomen  no excoriations no pustules  Cervical Nodes:  No lymphadenopathy noted Inguinal Nodes:  No significant adenopathy Psych:  normal affect, talkative and pleasant    Impression & Recommendations:  Problem # 1:  HYPERTENSION (ICD-401.9) Assessment Deteriorated  this is worse with his change to losartan for cost will call ins to see which arb is covered and let me know ace causes cough His updated medication list for this problem includes:    Benicar 20 Mg Tabs (Olmesartan medoxomil) .Marland Kitchen... Take one by mouth daily    Losartan Potassium 50 Mg Tabs (Losartan potassium) .Marland Kitchen... Take 2  tablets  by mouth once a day  BP today: 154/94 Prior BP: 150/100 (03/09/2010)  Labs Reviewed: K+: 4.6 (10/28/2009) Creat: : 0.9 (10/28/2009)   Chol: 195 (10/28/2009)   HDL: 58.00 (10/28/2009)   LDL: 121 (10/28/2009)   TG: 82.0 (10/28/2009)  Orders: Prescription Created Electronically [id number])  Problem # 2:  BRONCHITIS- ACUTE (ICD-466.0) Assessment: New  with persistant cough and now some facial pain that could be from sinusitis  recommend sympt care- see pt instructions   zithromax - as directed and update if worse will call esp if cough/sob His updated medication list for this problem includes:    Nyquil 60-7.01-31-999 Mg/24ml Liqd (Pseudoeph-doxylamine-dm-apap) ..... Otc as directed.    Allegra-d 12 Hour 60-120 Mg Xr12h-tab (Fexofenadine-pseudoephedrine) ..... Otc as directed.    Zithromax Z-pak 250 Mg Tabs (Azithromycin) .Marland Kitchen... Take by mouth as directed  Orders: Prescription Created Electronically [phone])  Problem # 3:  CONTACT DERMATITIS&OTHER ECZEMA DUE UNSPEC CAUSE (ICD-692.9) Assessment: New  small spot on abd with itch  and help with calamine suspect poison ivy or oak given px for elicon cream update if worse or signs of infx  His updated medication list for this problem includes:    Allegra 180 Mg Tabs (Fexofenadine hcl) .Marland Kitchen... Take one by mouth daily as needed    Elocon 0.1 % Crea (Mometasone furoate) .Marland Kitchen... Apply to affected area once daily as needed (dermatitis)  Orders: Prescription Created Electronically [phone])  Complete Medication List: 1)  Benicar 20 Mg Tabs (Olmesartan medoxomil) .... Take one by mouth daily 2)  Allegra 180 Mg Tabs (Fexofenadine hcl) .... Take one by mouth daily as needed 3)  Mobic 15 Mg Tabs (Meloxicam) .Marland Kitchen.. 1 by mouth once daily with food ( a meal ) as needed hand pain 4)  Losartan Potassium 50 Mg Tabs (Losartan potassium) .... Take 2  tablets  by mouth once a day 5)  Nyquil 60-7.01-31-999 Mg/25ml Liqd (Pseudoeph-doxylamine-dm-apap) .... Otc as directed. 6)  Allegra-d 12 Hour 60-120 Mg Xr12h-tab (Fexofenadine-pseudoephedrine) .... Otc as directed. 7)  Zithromax Z-pak 250 Mg Tabs (Azithromycin) .... Take by mouth as directed 8)  Elocon 0.1 %  Crea (Mometasone furoate) .... Apply to affected area once daily as needed (dermatitis)  Patient Instructions: 1)  your losartan (generic cozaar) is not working - we need to put you back on benicar or other med like it  2)  let your insurance know that the cozaar does not work and in the past altace (ace inhibitor ) - makes you cough 3)  call me and let me know what you want me to do  4)  use elicon cream as directed  -let me know if your rash does not improve  5)  take the zithromax as directed for bronchitis and please let me know if not impoved or if worse  Prescriptions: ELOCON 0.1 % CREA (MOMETASONE FUROATE) apply to affected area once daily as needed (dermatitis)  #1 small x 0   Entered and Authorized by:   Judith Part MD   Signed by:   Judith Part MD on 03/18/2010   Method used:   Electronically to        [facility] [id number]       [address]       [place], [place]  [address]       Ph: [phone]       Fax: [phone]   RxID:   [id number] ZITHROMAX Z-PAK 250 MG TABS (AZITHROMYCIN) take by mouth as directed  #1 pack x 0   Entered and Authorized by:   Judith Part MD   Signed by:   Judith Part MD on 03/18/2010   Method used:   [facility] to        [facility]  [address] [id number]       [address]       [place], [place]  [address]       Ph: [phone]       Fax: [phone]   RxID:   [id number]   Current Allergies (reviewed today): ! SULFA ! MOTRIN ! * ALTASE

## 2010-10-06 NOTE — Letter (Signed)
Summary: [facility]   Imported By: Lanelle Bal 10/14/2009 10:45:10  _____________________________________________________________________  External Attachment:    Type:   Image     Comment:   External Document

## 2010-10-06 NOTE — Consult Note (Signed)
Summary: [facility]   Imported By: Maryln Gottron 12/13/2009 13:32:35  _____________________________________________________________________  External Attachment:    Type:   Image     Comment:   External Document

## 2010-10-06 NOTE — Progress Notes (Signed)
Summary: wants referral to hand specialist  Phone Note Call from Patient Call back at Home Phone [phone] Call back at [phone]   Caller: Patient Call For: Judith Part MD Summary of Call: Advised pt of x-rays results.  He said the mobic isnt helping at all and he would like a referral to hand specialist in [place]. Initial call taken by: Lowella Petties CMA,  November 30, 2009 12:26 PM  Follow-up for Phone Call        will do ref and route to [facility] Follow-up by: Judith Part MD,  November 30, 2009 1:25 PM  Additional Follow-up for Phone Call Additional follow up Details #1::        Appt made with Dr Betha Loa on 12/06/2009. Carlton Adam  December 02, 2009 8:23 AM  Additional Follow-up by: Carlton Adam,  December 02, 2009 8:23 AM

## 2010-10-06 NOTE — Progress Notes (Signed)
Summary: diarrhea  Phone Note Call from Patient Call back at Home Phone [phone]   Caller: Patient Call For: Judith Part MD Summary of Call: Pt compains of diarrhea since early morning.  He had nausea for a couple of days prior.  May have had slight fever last night.  He is concerned this could be coming from diverticulitis.  [facility].  He said he would go to urgent care at [place], since he has been there before. Initial call taken by: Lowella Petties CMA,  February 11, 2010 8:57 AM  Follow-up for Phone Call        I agree with recommendation Follow-up by: Judith Part MD,  February 11, 2010 9:10 AM

## 2010-11-05 ENCOUNTER — Encounter: Payer: Self-pay | Admitting: Family Medicine

## 2010-11-24 LAB — HEPATIC FUNCTION PANEL
ALT: 19 U/L (ref 0–53)
Albumin: 4 g/dL (ref 3.5–5.2)
Alkaline Phosphatase: 77 U/L (ref 39–117)
Total Protein: 7 g/dL (ref 6.0–8.3)

## 2010-11-24 LAB — URINALYSIS, ROUTINE W REFLEX MICROSCOPIC
Glucose, UA: NEGATIVE mg/dL
Hgb urine dipstick: NEGATIVE
Protein, ur: NEGATIVE mg/dL
Specific Gravity, Urine: 1.016 (ref 1.005–1.030)
pH: 7.5 (ref 5.0–8.0)

## 2010-11-24 LAB — CBC
HCT: 45.2 % (ref 39.0–52.0)
Hemoglobin: 14.4 g/dL (ref 13.0–17.0)
Hemoglobin: 15.3 g/dL (ref 13.0–17.0)
MCHC: 34.7 g/dL (ref 30.0–36.0)
MCV: 89.5 fL (ref 78.0–100.0)
MCV: 90.4 fL (ref 78.0–100.0)
Platelets: 203 10*3/uL (ref 150–400)
RBC: 4.82 MIL/uL (ref 4.22–5.81)
RDW: 12.7 % (ref 11.5–15.5)
RDW: 13.1 % (ref 11.5–15.5)
WBC: 12.3 10*3/uL — ABNORMAL HIGH (ref 4.0–10.5)
WBC: 5.6 10*3/uL (ref 4.0–10.5)
WBC: 7.4 10*3/uL (ref 4.0–10.5)

## 2010-11-24 LAB — BASIC METABOLIC PANEL
BUN: 16 mg/dL (ref 6–23)
BUN: 8 mg/dL (ref 6–23)
CO2: 31 mEq/L (ref 19–32)
Calcium: 8.8 mg/dL (ref 8.4–10.5)
Calcium: 8.9 mg/dL (ref 8.4–10.5)
Calcium: 9.2 mg/dL (ref 8.4–10.5)
Chloride: 103 mEq/L (ref 96–112)
Chloride: 104 mEq/L (ref 96–112)
Chloride: 106 mEq/L (ref 96–112)
Creatinine, Ser: 1.05 mg/dL (ref 0.4–1.5)
Creatinine, Ser: 1.14 mg/dL (ref 0.4–1.5)
GFR calc Af Amer: >60 mL/min (ref >60)
GFR calc Af Amer: >60 mL/min (ref >60)
GFR calc non Af Amer: >60 mL/min (ref >60)
GFR calc non Af Amer: >60 mL/min (ref >60)
Glucose, Bld: 113 mg/dL — ABNORMAL HIGH (ref 70–99)
Glucose, Bld: 114 mg/dL — ABNORMAL HIGH (ref 70–99)
Glucose, Bld: 122 mg/dL — ABNORMAL HIGH (ref 70–99)
Potassium: 3.7 mEq/L (ref 3.5–5.1)
Sodium: 137 mEq/L (ref 135–145)
Sodium: 138 mEq/L (ref 135–145)
Sodium: 142 mEq/L (ref 135–145)

## 2010-11-24 LAB — HEMOCCULT GUIAC POC 1CARD (OFFICE): Fecal Occult Bld: NEGATIVE

## 2010-11-24 LAB — DIFFERENTIAL
Basophils Absolute: 0 10*3/uL (ref 0.0–0.1)
Basophils Relative: 0 % (ref 0–1)
Eosinophils Absolute: 0.1 10*3/uL (ref 0.0–0.7)
Eosinophils Absolute: 0.2 10*3/uL (ref 0.0–0.7)
Eosinophils Relative: 1 % (ref 0–5)
Lymphocytes Relative: 5 % — ABNORMAL LOW (ref 12–46)
Lymphs Abs: 0.7 10*3/uL (ref 0.7–4.0)
Monocytes Absolute: 0.8 10*3/uL (ref 0.1–1.0)
Monocytes Relative: 15 % — ABNORMAL HIGH (ref 3–12)
Neutrophils Relative %: 65 % (ref 43–77)

## 2010-11-24 LAB — LIPASE, BLOOD: Lipase: 24 U/L (ref 11–59)

## 2010-12-06 LAB — CBC
HCT: 37.5 % — ABNORMAL LOW (ref 39.0–52.0)
Hemoglobin: 11.8 g/dL — ABNORMAL LOW (ref 13.0–17.0)
Hemoglobin: 12.9 g/dL — ABNORMAL LOW (ref 13.0–17.0)
MCHC: 34.1 g/dL (ref 30.0–36.0)
MCHC: 34.2 g/dL (ref 30.0–36.0)
MCV: 90.3 fL (ref 78.0–100.0)
RBC: 4.16 MIL/uL — ABNORMAL LOW (ref 4.22–5.81)
RDW: 12.4 % (ref 11.5–15.5)

## 2010-12-06 LAB — BASIC METABOLIC PANEL
CO2: 27 mEq/L (ref 19–32)
CO2: 28 mEq/L (ref 19–32)
Calcium: 8.1 mg/dL — ABNORMAL LOW (ref 8.4–10.5)
Chloride: 103 mEq/L (ref 96–112)
Creatinine, Ser: 0.84 mg/dL (ref 0.4–1.5)
GFR calc Af Amer: >60 mL/min (ref >60)
Glucose, Bld: 112 mg/dL — ABNORMAL HIGH (ref 70–99)
Sodium: 135 mEq/L (ref 135–145)

## 2010-12-07 LAB — HEMOGLOBIN AND HEMATOCRIT, BLOOD: HCT: 40.5 % (ref 39.0–52.0)

## 2010-12-07 LAB — COMPREHENSIVE METABOLIC PANEL
ALT: 25 U/L (ref 0–53)
AST: 23 U/L (ref 0–37)
Albumin: 4 g/dL (ref 3.5–5.2)
CO2: 32 mEq/L (ref 19–32)
Calcium: 9.3 mg/dL (ref 8.4–10.5)
GFR calc Af Amer: >60 mL/min (ref >60)
GFR calc non Af Amer: >60 mL/min (ref >60)
Sodium: 143 mEq/L (ref 135–145)

## 2010-12-07 LAB — URINALYSIS, ROUTINE W REFLEX MICROSCOPIC
Bilirubin Urine: NEGATIVE
Glucose, UA: NEGATIVE mg/dL
Hgb urine dipstick: NEGATIVE
Ketones, ur: NEGATIVE mg/dL
Protein, ur: NEGATIVE mg/dL

## 2010-12-07 LAB — DIFFERENTIAL
Eosinophils Absolute: 0.2 10*3/uL (ref 0.0–0.7)
Eosinophils Relative: 4 % (ref 0–5)
Lymphs Abs: 1.2 10*3/uL (ref 0.7–4.0)
Monocytes Absolute: 0.5 10*3/uL (ref 0.1–1.0)
Monocytes Relative: 10 % (ref 3–12)

## 2010-12-07 LAB — TYPE AND SCREEN

## 2010-12-07 LAB — CBC
MCHC: 34.2 g/dL (ref 30.0–36.0)
RBC: 4.94 MIL/uL (ref 4.22–5.81)
WBC: 5.3 10*3/uL (ref 4.0–10.5)

## 2010-12-07 LAB — ABO/RH: ABO/RH(D): B POS

## 2010-12-16 ENCOUNTER — Telehealth: Payer: Self-pay | Admitting: Family Medicine

## 2010-12-16 DIAGNOSIS — Z0000 Encounter for general adult medical examination without abnormal findings: Secondary | ICD-10-CM | POA: Insufficient documentation

## 2010-12-16 DIAGNOSIS — Z125 Encounter for screening for malignant neoplasm of prostate: Secondary | ICD-10-CM

## 2010-12-16 NOTE — Telephone Encounter (Signed)
Message copied by Roxy Manns on Fri Dec 16, 2010 10:27 AM ------      Message from: Melody Comas      Created: Wed Dec 14, 2010 11:18 AM      Regarding: CPX labs for Monday        Patient having cpx labs, please order future labs.

## 2010-12-19 ENCOUNTER — Other Ambulatory Visit (INDEPENDENT_AMBULATORY_CARE_PROVIDER_SITE_OTHER): Payer: 59 | Admitting: Family Medicine

## 2010-12-19 DIAGNOSIS — Z0000 Encounter for general adult medical examination without abnormal findings: Secondary | ICD-10-CM

## 2010-12-19 DIAGNOSIS — Z125 Encounter for screening for malignant neoplasm of prostate: Secondary | ICD-10-CM

## 2010-12-19 LAB — CBC WITH DIFFERENTIAL/PLATELET
Basophils Absolute: 0 10*3/uL (ref 0.0–0.1)
Eosinophils Absolute: 0.3 10*3/uL (ref 0.0–0.7)
HCT: 43.4 % (ref 39.0–52.0)
Hemoglobin: 15.1 g/dL (ref 13.0–17.0)
Lymphocytes Relative: 28 % (ref 12.0–46.0)
Lymphs Abs: 1.4 10*3/uL (ref 0.7–4.0)
MCHC: 34.8 g/dL (ref 30.0–36.0)
Neutro Abs: 2.9 10*3/uL (ref 1.4–7.7)
RDW: 13 % (ref 11.5–14.6)

## 2010-12-19 LAB — LIPID PANEL
HDL: 47.4 mg/dL (ref >39.00)
LDL Cholesterol: 116 mg/dL — ABNORMAL HIGH (ref 0–99)
Total CHOL/HDL Ratio: 4
Triglycerides: 65 mg/dL (ref 0.0–149.0)

## 2010-12-19 LAB — COMPREHENSIVE METABOLIC PANEL
ALT: 20 U/L (ref 0–53)
AST: 18 U/L (ref 0–37)
Creatinine, Ser: 1.1 mg/dL (ref 0.4–1.5)
Total Bilirubin: 0.7 mg/dL (ref 0.3–1.2)

## 2010-12-21 ENCOUNTER — Ambulatory Visit (INDEPENDENT_AMBULATORY_CARE_PROVIDER_SITE_OTHER): Payer: 59 | Admitting: Family Medicine

## 2010-12-21 ENCOUNTER — Encounter: Payer: Self-pay | Admitting: Family Medicine

## 2010-12-21 DIAGNOSIS — M722 Plantar fascial fibromatosis: Secondary | ICD-10-CM | POA: Insufficient documentation

## 2010-12-21 DIAGNOSIS — Z125 Encounter for screening for malignant neoplasm of prostate: Secondary | ICD-10-CM

## 2010-12-21 DIAGNOSIS — Z8719 Personal history of other diseases of the digestive system: Secondary | ICD-10-CM

## 2010-12-21 DIAGNOSIS — I10 Essential (primary) hypertension: Secondary | ICD-10-CM

## 2010-12-21 DIAGNOSIS — Z86010 Personal history of colonic polyps: Secondary | ICD-10-CM

## 2010-12-21 DIAGNOSIS — Z0000 Encounter for general adult medical examination without abnormal findings: Secondary | ICD-10-CM

## 2010-12-21 MED ORDER — OLMESARTAN MEDOXOMIL 20 MG PO TABS: 20.0000 mg | ORAL_TABLET | Freq: Every day | ORAL | Status: DC

## 2010-12-21 NOTE — Progress Notes (Signed)
Subjective:    Patient ID: Robert Page, male    DOB: 1946-03-03, 65 y.o.   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 Plantar fasciitis - hurts after inactivity  Has not seen Dr Charlsie Merles about it yet   Allergies are bad with the pollen   Had his eye check up -- takes drops for conjunctivitis for springtime   Td05 zostavax- does not want it -- because he had shingles in the past    colonosc 09 - adenom polyp- due 5 y Then had colon surgery- semi colectomy -- Dr Derrell Lolling -- told him to wait until next year   Nl cbc/ cmet and tsh  Psa is nl and stable Prostate symtoms-- will have nocturia if he drinks tea before bed  Usually goes once per night   Lipids are down with LDL 116 Lab Results  Component Value Date   CHOL 176 12/19/2010   CHOL 195 10/28/2009   CHOL 180 09/23/2008   Lab Results  Component Value Date   HDL 47.40 12/19/2010   HDL 30.86 10/28/2009   HDL 57.8 09/23/2008   Lab Results  Component Value Date   LDLCALC 116* 12/19/2010   LDLCALC 121* 10/28/2009   LDLCALC 118* 09/23/2008   Lab Results  Component Value Date   TRIG 65.0 12/19/2010   TRIG 82.0 10/28/2009   TRIG 67 09/23/2008   Lab Results  Component Value Date   CHOLHDL 4 12/19/2010   CHOLHDL 3 10/28/2009   CHOLHDL 3.7 CALC 09/23/2008    HTN is fairly controlled with 134/84 today No headaches or side effects    Wt is up 6 lbs -- hard to get it off  Works hard on the farm   Skin cancer screening - sees Dr Nicholas Lose yearly in the summer Has had some moles removed   Past Medical History  Diagnosis Date  . Allergy   . Hypertension   . Diverticulitis   . Diverticulosis   . Pulmonary infiltrate     RLL  . Dupuytren's disease     palms and soles  . Adenomatous polyp    Past Surgical History    Procedure Date  . Lasik 2002  . Colectomy     for diverticulitis    reports that he has been smoking Cigars.  He does not have any smokeless tobacco history on file. He reports that he does not drink alcohol or use illicit drugs. family history includes Colon cancer in some unspecified family members; Heart disease in his cousin and unspecified family members; Hypertension in his father and mother; Kidney disease in his mother and unspecified family member; Liver cancer in an unspecified family member; Lung cancer in an unspecified family member; Macular degeneration in his mother; Melanoma in his sister; Prostate cancer in an unspecified family member; Stomach cancer in his cousin; and Stroke in an unspecified family member. Allergies  Allergen Reactions  . Ibuprofen     REACTION: stomach cramps  . Sulfonamide Derivatives     REACTION: itch    History   Social History  . Marital Status: Married    Spouse Name: Kmarion Rawl    Number of Children: N/A  . Years of Education:  N/A   Occupational History  . Not on file.   Social History Main Topics  . Smoking status: Smoker, Current Status Unknown    Types: Cigars  . Smokeless tobacco: Not on file   Comment: Pt states smokes occasional cigar.  . Alcohol Use: No  . Drug Use: No  . Sexually Active: Not on file   Other Topics Concern  . Not on file   Social History Narrative   Goes on mission trips to [place], gets regular exercise.    Family History  Problem Relation Age of Onset  . Melanoma Sister   . Hypertension Mother   . Hypertension Father   . Macular degeneration Mother   . Stroke      uncle  . Prostate cancer      uncle  . Colon cancer      uncle  . Colon cancer      grandmother  . Stomach cancer Cousin   . Kidney disease Mother   . Kidney disease      grandmother  . Liver cancer      uncle (mets)  . Heart disease Cousin   . Heart disease      uncle  . Heart disease      aunt  . Lung cancer       uncle       Review of Systems  Constitutional: Negative for appetite change, fatigue and unexpected weight change.  HENT: Positive for rhinorrhea and sneezing.   Eyes: Negative for pain and visual disturbance.  Respiratory: Negative for cough and wheezing.   Cardiovascular: Negative.   Gastrointestinal: Negative for abdominal pain, diarrhea, constipation and blood in stool.  Genitourinary: Negative for urgency, frequency and decreased urine volume.  Musculoskeletal: Negative for myalgias and joint swelling.  Skin: Negative.   Neurological: Negative for weakness, light-headedness and headaches.  Hematological: Negative for adenopathy. Does not bruise/bleed easily.  Psychiatric/Behavioral: Negative for dysphoric mood. The patient is not nervous/anxious.        Objective:   Physical Exam  Constitutional: He is oriented to person, place, and time. He appears well-developed and well-nourished. No distress.  HENT:  Head: Normocephalic and atraumatic.  Right Ear: External ear normal.  Left Ear: External ear normal.  Mouth/Throat: Oropharynx is clear and moist.       Boggy pale nares  Scant cerumen   Eyes: Conjunctivae and EOM are normal. Pupils are equal, round, and reactive to light.  Neck: Normal range of motion. Neck supple. No JVD present. Carotid bruit is not present.  Cardiovascular: Normal rate, regular rhythm and normal heart sounds.   Pulmonary/Chest: Effort normal and breath sounds normal. He has no wheezes. He has no rales.  Abdominal: Soft. Bowel sounds are normal. He exhibits no mass. There is no tenderness.  Genitourinary: Prostate normal. Rectal exam shows no mass and anal tone normal. Guaiac negative stool. Prostate is not tender.  Musculoskeletal: Normal range of motion. He exhibits no edema and no tenderness.       L heel diffusely tender on plantar surface No skin change or swelling Nl rom foot  Some pain with dorsiflexion No bony tenderness Nl gait     Lymphadenopathy:    He has no cervical adenopathy.  Neurological: He is alert and oriented to person, place, and time. He has normal reflexes. Coordination normal.  Skin: Skin is warm and dry. No rash noted. No erythema. No pallor.       lentigos diffusely  Psychiatric: He has  a normal mood and affect.          Assessment & Plan:

## 2010-12-21 NOTE — Assessment & Plan Note (Signed)
Improved after partial colectomy

## 2010-12-21 NOTE — Assessment & Plan Note (Signed)
Reviewed health habits including diet and exercise and skin cancer prevention Also reviewed health mt list, fam hx and immunizations   Reviewed wellness labs in detail Pt declined zostavax  DRE done

## 2010-12-21 NOTE — Assessment & Plan Note (Signed)
This is controlled with benicar No changes (did not resp to losartan) Disc imp of healthy diet and exercise and wt loss Rev labs with pt

## 2010-12-21 NOTE — Assessment & Plan Note (Signed)
DRE unchanged today No change in urination psa is stable  Will continue to monitor

## 2010-12-21 NOTE — Assessment & Plan Note (Signed)
With heel pain in L foot after inactivity Handouts from [facility] regarding the condition given I recommended a hard soled shoe at all times (no barefoot or flip flops)  Ice bid for at least 10 min  nsaids prn if safe  Update if not imp 10 d- consider pod or sports med ref

## 2010-12-21 NOTE — Patient Instructions (Addendum)
Stick with allegra 180 once daily-- do not take the "D"  Work on healthy diet and exercise for weight loss Avoid red meat/ fried foods/ egg yolks/ fatty breakfast meats/ butter, cheese and high fat dairy/ and shellfish  -for cholesterol  If you ever change your mind about a shingles vaccine let us know For your foot / plantar fasciitis -- use ice at least twice daily for at least 10 minutes -- try rolling foot over a frozen OJ can in the mornings  Wear a hard soled shoe all the time- do not go barefoot Follow up with Dr Charlsie Merles if not improved

## 2010-12-21 NOTE — Assessment & Plan Note (Signed)
Based on recent colectomy- will prob be due next year for colonoscopy-per adv of his surgeon

## 2011-04-12 ENCOUNTER — Other Ambulatory Visit: Payer: Self-pay | Admitting: Dermatology

## 2011-09-28 ENCOUNTER — Ambulatory Visit (INDEPENDENT_AMBULATORY_CARE_PROVIDER_SITE_OTHER): Payer: 59

## 2011-09-28 DIAGNOSIS — J0100 Acute maxillary sinusitis, unspecified: Secondary | ICD-10-CM

## 2011-12-28 ENCOUNTER — Encounter: Payer: Self-pay | Admitting: Internal Medicine

## 2012-01-17 ENCOUNTER — Other Ambulatory Visit: Payer: Self-pay | Admitting: *Deleted

## 2012-01-17 DIAGNOSIS — I10 Essential (primary) hypertension: Secondary | ICD-10-CM

## 2012-01-17 MED ORDER — OLMESARTAN MEDOXOMIL 20 MG PO TABS: 20.0000 mg | ORAL_TABLET | Freq: Every day | ORAL | Status: DC

## 2012-08-31 ENCOUNTER — Ambulatory Visit (INDEPENDENT_AMBULATORY_CARE_PROVIDER_SITE_OTHER): Payer: 59 | Admitting: Family Medicine

## 2012-08-31 VITALS — BP 143/77 | HR 56 | Temp 97.8°F | Resp 16 | Ht 69.0 in | Wt 217.0 lb

## 2012-08-31 DIAGNOSIS — H6090 Unspecified otitis externa, unspecified ear: Secondary | ICD-10-CM

## 2012-08-31 DIAGNOSIS — H60399 Other infective otitis externa, unspecified ear: Secondary | ICD-10-CM

## 2012-08-31 DIAGNOSIS — H6120 Impacted cerumen, unspecified ear: Secondary | ICD-10-CM

## 2012-08-31 DIAGNOSIS — H902 Conductive hearing loss, unspecified: Secondary | ICD-10-CM

## 2012-08-31 MED ORDER — OFLOXACIN 0.3 % OT SOLN: 5.0000 [drp] | Freq: Every day | OTIC | Status: DC

## 2012-08-31 NOTE — Progress Notes (Signed)
Patient was examined also by me. He has a bit of wax in his ear. I was unable to curette this out. Also tried alligators and only got a little bit. He is mildly erythematous on the inferior portion of the canal. He is very tender just below his ear. Agree with Heather's note.

## 2012-08-31 NOTE — Progress Notes (Signed)
  Subjective:    Patient ID: Robert Page, male    DOB: June 19, 1946, 66 y.o.   MRN: [id number]  HPI 66 year old male presents with acute onset of left ear pressure, fullness, and decreased hearing.  Also admits to some otalgia that does seem to have worsened.  No history of cerumen impaction. Does admit to Q-tip use on occasion.  No nasal congestion, cough, sore throat, fever, chills, or headache.  Hx of hypertension.      Review of Systems  Constitutional: Negative for fever and chills.  HENT: Positive for ear pain. Negative for congestion, sore throat, rhinorrhea, neck pain, postnasal drip and tinnitus.   Respiratory: Negative for cough.   Gastrointestinal: Negative for vomiting and diarrhea.  Neurological: Negative for headaches.  All other systems reviewed and are negative.       Objective:   Physical Exam  Constitutional: He is oriented to person, place, and time. He appears well-developed and well-nourished.  HENT:  Head: Normocephalic and atraumatic.  Right Ear: Hearing, tympanic membrane, external ear and ear canal normal.  Left Ear: Tympanic membrane normal. There is tenderness (in canal and posterior auricle). Decreased hearing is noted.  Mouth/Throat: Uvula is midline, oropharynx is clear and moist and mucous membranes are normal.       Left cerumen impaction.  Normal TM revealed s/p irrigation, but canal still tender.   Eyes: Conjunctivae normal are normal.  Neck: Normal range of motion.  Cardiovascular: Normal rate, regular rhythm and normal heart sounds.   Pulmonary/Chest: Effort normal and breath sounds normal.  Lymphadenopathy:    He has no cervical adenopathy.  Neurological: He is alert and oriented to person, place, and time.  Psychiatric: He has a normal mood and affect. His behavior is normal. Judgment and thought content normal.          Assessment & Plan:   1. Cerumen impaction    2. Conductive hearing loss    3. Otitis externa  ofloxacin (FLOXIN  OTIC) 0.3 % otic solution   Will treat empirically with floxin otic Ear care provided Follow up if symptoms worsen or fail to improve.

## 2012-08-31 NOTE — Patient Instructions (Signed)
Discontinue use of Q-tips, hair pins, keys, etc.   To help prevent cerumen impaction: While in the washing hair in the shower, allow soapy water to run into ear canal for a few minutes and then rinse (it dissolves the wax like a dishwasher dissolves grease).   May also use half hydrogen peroxide half water in the shower 2-3 times per week to help rinse out the wax. DO NOT USE 100% HYDROGEN PEROXIDE (this can burn the skin).   Also, several drops of sweet oil can be used to help prevent itching of the canal. If this is not enough to decrease itch, you may put a small amount of OTC hydrocortisone cream on pinky finger and apply to portion of canal that can be reached with tip of finger.  Recommend OTC Debrox or Colace to prevent impaction.  

## 2012-09-06 ENCOUNTER — Encounter: Payer: Self-pay | Admitting: Internal Medicine

## 2012-09-06 DIAGNOSIS — I10 Essential (primary) hypertension: Secondary | ICD-10-CM | POA: Diagnosis not present

## 2012-09-06 DIAGNOSIS — R002 Palpitations: Secondary | ICD-10-CM | POA: Diagnosis not present

## 2012-09-10 ENCOUNTER — Encounter: Payer: Self-pay | Admitting: Internal Medicine

## 2012-09-10 DIAGNOSIS — J111 Influenza due to unidentified influenza virus with other respiratory manifestations: Secondary | ICD-10-CM | POA: Diagnosis not present

## 2012-09-10 DIAGNOSIS — R509 Fever, unspecified: Secondary | ICD-10-CM | POA: Diagnosis not present

## 2012-09-13 DIAGNOSIS — I471 Supraventricular tachycardia: Secondary | ICD-10-CM | POA: Diagnosis not present

## 2012-10-01 DIAGNOSIS — H0019 Chalazion unspecified eye, unspecified eyelid: Secondary | ICD-10-CM | POA: Diagnosis not present

## 2012-10-01 DIAGNOSIS — H109 Unspecified conjunctivitis: Secondary | ICD-10-CM | POA: Diagnosis not present

## 2012-10-24 ENCOUNTER — Encounter: Payer: Self-pay | Admitting: *Deleted

## 2012-10-24 ENCOUNTER — Ambulatory Visit (INDEPENDENT_AMBULATORY_CARE_PROVIDER_SITE_OTHER): Payer: Medicare Other | Admitting: Internal Medicine

## 2012-10-24 ENCOUNTER — Encounter: Payer: Self-pay | Admitting: Internal Medicine

## 2012-10-24 VITALS — BP 138/82 | HR 58 | Resp 18 | Ht 69.0 in | Wt 219.5 lb

## 2012-10-24 DIAGNOSIS — R002 Palpitations: Secondary | ICD-10-CM | POA: Diagnosis not present

## 2012-10-24 DIAGNOSIS — I10 Essential (primary) hypertension: Secondary | ICD-10-CM

## 2012-10-24 DIAGNOSIS — K219 Gastro-esophageal reflux disease without esophagitis: Secondary | ICD-10-CM | POA: Insufficient documentation

## 2012-10-24 NOTE — Patient Instructions (Addendum)
Your physician has requested that you have an echocardiogram. Echocardiography is a painless test that uses sound waves to create images of your heart. It provides your doctor with information about the size and shape of your heart and how well your heart's chambers and valves are working. This procedure takes approximately one hour. There are no restrictions for this procedure.  Your physician has requested that you have an exercise stress myoview. For further information please visit [email or web address] Please follow instruction sheet, as given.  Your physician has recommended that you have a sleep study. This test records several body functions during sleep, including: brain activity, eye movement, oxygen and carbon dioxide blood levels, heart rate and rhythm, breathing rate and rhythm, the flow of air through your mouth and nose, snoring, body muscle movements, and chest and belly movement.  Your physician recommends that you schedule a follow-up appointment in: 6 weeks with Dr. Johney Frame.

## 2012-10-24 NOTE — Progress Notes (Signed)
Primary Care Physician: [name] is a 67 y.o. male with a h/o HTN and palpitations who presents for EP consultation.  The patient recently presented to [name] office complaining of irregular palpitations, more prominent at night.  Episodes typically lasted several seconds but recurred frequently.  He was placed on bystolic.  He was evaluated and had an event monitor placed.  This documented nonsustained narrow complex tachycardia.  He was also found to have episodes of bradycardia and therefore his bystolic was discontinued.  He has been treated with low dose diltiazem since that time which he has tolerated.  Over the past few weeks, his palpitations have much improved.  He has therefore stopped taking diltiazem. He reports fatigue.  He snores at night and does not feel well rested in the am.  He also has decreased exercise tolerance and SOB with his fatigue.  Today, he denies symptoms of chest pain, orthopnea, PND, lower extremity edema, dizziness, presyncope, syncope, or neurologic sequela. The patient is tolerating medications without difficulties and is otherwise without complaint today.   Past Medical History  Diagnosis Date  . Allergy   . Hypertension   . Diverticulosis   . Dupuytren's disease     palms and soles  . Adenomatous polyp   . GERD (gastroesophageal reflux disease)    Past Surgical History  Procedure Laterality Date  . Lasik  2002  . Colectomy  2010    for diverticulitis    Current Outpatient Prescriptions  Medication Sig Dispense Refill  . fexofenadine (ALLEGRA) 180 MG tablet Take 180 mg by mouth daily as needed.        Marland Kitchen olmesartan (BENICAR) 20 MG tablet Take 1 tablet (20 mg total) by mouth daily.  30 tablet  0  . omeprazole (PRILOSEC) 10 MG capsule Take 10 mg by mouth daily.      . psyllium (REGULOID) 0.52 G capsule Take 0.52 g by mouth daily.       No current facility-administered medications for this visit.    Allergies  Allergen  Reactions  . Ibuprofen     REACTION: stomach cramps  . Sulfonamide Derivatives     REACTION: itch    History   Social History  . Marital Status: Married    Spouse Name: Robert Page    Number of Children: N/A  . Years of Education: N/A   Occupational History  . Not on file.   Social History Main Topics  . Smoking status: Smoker, Current Status Unknown    Types: Cigars  . Smokeless tobacco: Not on file     Comment: Pt states smokes occasional cigar.  This is typically < 1 time per month  . Alcohol Use: No  . Drug Use: No  . Sexually Active: Not on file   Other Topics Concern  . Not on file   Social History Narrative   Pt lives in [place].  Retired from [employer] and [employer].  Goes on mission trips to [place], gets regular exercise.    Family History  Problem Relation Age of Onset  . Melanoma Sister   . Hypertension Mother   . Hypertension Father   . Macular degeneration Mother   . Stroke      uncle  . Prostate cancer      uncle  . Colon cancer      uncle  . Colon cancer      grandmother  . Stomach cancer Cousin   . Kidney  disease Mother   . Kidney disease      grandmother  . Liver cancer      uncle (mets)  . Heart disease Cousin   . Heart disease      uncle  . Heart disease      aunt  . Lung cancer      uncle    ROS- All systems are reviewed and negative except as per the HPI above  Physical Exam: Filed Vitals:   10/24/12 0808  BP: 138/82  Pulse: 58  Resp: 18  Height: 5\' 9"  (1.753 m)  Weight: 219 lb 8 oz (99.565 kg)    GEN- The patient is well appearing, alert and oriented x 3 today.   Head- normocephalic, atraumatic Eyes-  Sclera clear, conjunctiva pink Ears- hearing intact Oropharynx- clear Neck- supple, no JVP Lymph- no cervical lymphadenopathy Lungs- Clear to ausculation bilaterally, normal work of breathing Heart- Regular rate and rhythm, no murmurs, rubs or gallops, PMI not laterally displaced GI- soft, NT, ND, +  BS Extremities- no clubbing, cyanosis, or edema MS- no significant deformity or atrophy Skin- no rash or lesion Psych- euthymic mood, full affect Neuro- strength and sensation are intact  EKGs and event monitor reviewed  Assessment and Plan:

## 2012-10-29 ENCOUNTER — Ambulatory Visit (HOSPITAL_COMMUNITY): Payer: Medicare Other | Attending: Cardiology | Admitting: Radiology

## 2012-10-29 VITALS — BP 143/86 | HR 56 | Ht 69.0 in | Wt 216.0 lb

## 2012-10-29 DIAGNOSIS — R0609 Other forms of dyspnea: Secondary | ICD-10-CM | POA: Diagnosis not present

## 2012-10-29 DIAGNOSIS — R5381 Other malaise: Secondary | ICD-10-CM | POA: Insufficient documentation

## 2012-10-29 DIAGNOSIS — I498 Other specified cardiac arrhythmias: Secondary | ICD-10-CM | POA: Diagnosis not present

## 2012-10-29 DIAGNOSIS — F17200 Nicotine dependence, unspecified, uncomplicated: Secondary | ICD-10-CM | POA: Insufficient documentation

## 2012-10-29 DIAGNOSIS — R0602 Shortness of breath: Secondary | ICD-10-CM

## 2012-10-29 DIAGNOSIS — E669 Obesity, unspecified: Secondary | ICD-10-CM | POA: Insufficient documentation

## 2012-10-29 DIAGNOSIS — R0989 Other specified symptoms and signs involving the circulatory and respiratory systems: Secondary | ICD-10-CM | POA: Insufficient documentation

## 2012-10-29 DIAGNOSIS — I10 Essential (primary) hypertension: Secondary | ICD-10-CM | POA: Insufficient documentation

## 2012-10-29 DIAGNOSIS — R002 Palpitations: Secondary | ICD-10-CM | POA: Diagnosis not present

## 2012-10-29 MED ORDER — TECHNETIUM TC 99M SESTAMIBI GENERIC - CARDIOLITE
30.0000 | Freq: Once | INTRAVENOUS | Status: AC | PRN
  Administered 2012-10-29: 30 via INTRAVENOUS

## 2012-10-29 MED ORDER — TECHNETIUM TC 99M SESTAMIBI GENERIC - CARDIOLITE
10.0000 | Freq: Once | INTRAVENOUS | Status: AC | PRN
  Administered 2012-10-29: 10 via INTRAVENOUS

## 2012-10-29 NOTE — Progress Notes (Signed)
[facility] SITE 3 NUCLEAR MED [address] [place], [place] [address] [phone]    Cardiology Nuclear Med Study  Robert Page is a 67 y.o. male     MRN : [id number]     DOB: [birth year]  Procedure Date: 10/29/2012  Nuclear Med Background Indication for Stress Test:  Evaluation for Ischemia History:  1990's Cath:normal per patient; h/o PSVT Cardiac Risk Factors: Hypertension, Obesity and Smoker  Symptoms:  DOE, Fatigue and Palpitations   Nuclear Pre-Procedure Caffeine/Decaff Intake:  None NPO After: 7:00pm   Lungs:  Clear. O2 Sat: 95% on room air. IV 0.9% NS with Angio Cath:  20g  IV Site: R Hand  IV Started by:  Cathlyn Parsons, RN  Chest Size (in):  44 Cup Size: n/a  Height: 5\' 9"  (1.753 m)  Weight:  216 lb (97.977 kg)  BMI:  Body mass index is 31.88 kg/(m^2). Tech Comments:  n/a    Nuclear Med Study 1 or 2 day study: 1 day  Stress Test Type:  Stress  Reading MD: Olga Millers, MD  Order Authorizing Provider:  Hillis Range, MD  Resting Radionuclide: Technetium 42m Sestamibi  Resting Radionuclide Dose: 11.0 mCi   Stress Radionuclide:  Technetium 3m Sestamibi  Stress Radionuclide Dose: 33.0 mCi           Stress Protocol Rest HR: 56 Stress HR: 142  Rest BP: 143/86 Stress BP: 217/84  Exercise Time (min): 7:01 METS: 8.1   Predicted Max HR: 155 bpm % Max HR: 91.61 bpm Rate Pressure Product: 46962   Dose of Adenosine (mg):  n/a Dose of Lexiscan: n/a mg  Dose of Atropine (mg): n/a Dose of Dobutamine: n/a mcg/kg/min (at max HR)  Stress Test Technologist: Smiley Houseman, CMA-N  Nuclear Technologist:  Doyne Keel, CNMT     Rest Procedure:  Myocardial perfusion imaging was performed at rest 45 minutes following the intravenous administration of Technetium 38m Sestamibi.  Rest ECG: Sinus bradycardia, no ST changes.  Stress Procedure:  The patient exercised on the treadmill utilizing the Bruce Protocol for 7:01 minutes. The patient stopped due to  fatigue and denied any chest pain.  Technetium 54m Sestamibi was injected at peak exercise and myocardial perfusion imaging was performed after a brief delay.  Stress ECG: Significant ST abnormalities consistent with ischemia.  QPS Raw Data Images:  Acquisition technically good; normal left ventricular size. Stress Images:  Normal homogeneous uptake in all areas of the myocardium. Rest Images:  Normal homogeneous uptake in all areas of the myocardium. Subtraction (SDS):  No evidence of ischemia. Transient Ischemic Dilatation (Normal <1.22):  0.97 Lung/Heart Ratio (Normal <0.45):  0.27  Quantitative Gated Spect Images QGS EDV:  105 ml QGS ESV:  45 ml  Impression Exercise Capacity:  Fair exercise capacity. BP Response:  Hypertensive blood pressure response. Clinical Symptoms:  There is dyspnea. ECG Impression:  Significant ST abnormalities consistent with ischemia. Comparison with Prior Nuclear Study: No images to compare  Overall Impression:  Low risk stress nuclear study with significant ST changes in the setting of hypertensive response; however, perfusion is normal.  LV Ejection Fraction: 57%.  LV Wall Motion:  NL LV Function; NL Wall Motion  Olga Millers

## 2012-10-30 ENCOUNTER — Ambulatory Visit (HOSPITAL_COMMUNITY): Payer: Medicare Other | Attending: Cardiology | Admitting: Radiology

## 2012-10-30 DIAGNOSIS — R5381 Other malaise: Secondary | ICD-10-CM | POA: Diagnosis not present

## 2012-10-30 DIAGNOSIS — I10 Essential (primary) hypertension: Secondary | ICD-10-CM | POA: Diagnosis not present

## 2012-10-30 DIAGNOSIS — I059 Rheumatic mitral valve disease, unspecified: Secondary | ICD-10-CM | POA: Insufficient documentation

## 2012-10-30 DIAGNOSIS — R002 Palpitations: Secondary | ICD-10-CM | POA: Diagnosis not present

## 2012-10-30 DIAGNOSIS — I369 Nonrheumatic tricuspid valve disorder, unspecified: Secondary | ICD-10-CM | POA: Diagnosis not present

## 2012-10-30 NOTE — Progress Notes (Signed)
Echocardiogram performed.  

## 2012-11-03 ENCOUNTER — Encounter: Payer: Self-pay | Admitting: Internal Medicine

## 2012-11-03 DIAGNOSIS — R5383 Other fatigue: Secondary | ICD-10-CM | POA: Insufficient documentation

## 2012-11-03 NOTE — Assessment & Plan Note (Addendum)
Stable No change required today  Echo as above

## 2012-11-03 NOTE — Assessment & Plan Note (Signed)
Unclear etiology I have spoken with Dr Tanya Nones who will make sure TFTs are up to date. I will order a sleep study as he snores and does not feel well rested.  Given decreased exercise tolerance, will also order a GXT myoview

## 2012-11-03 NOTE — Assessment & Plan Note (Addendum)
The patient has nonsustained SVT.  I suspect that this is atrial tachycardia. Therapeutic strategies for supraventricular tachycardia including medicine and ablation were discussed in detail with the patient today. Risk, benefits, and alternatives to EP study and radiofrequency ablation were also discussed in detail today.  He would like to continue his current regimen of as needed metoprolol at this time.  Verapamil would also be a reasonable option. We will make no changes today.  Will obtain an echo to evaluate for structural heart disease

## 2012-11-09 ENCOUNTER — Ambulatory Visit (INDEPENDENT_AMBULATORY_CARE_PROVIDER_SITE_OTHER): Payer: Medicare Other | Admitting: Family Medicine

## 2012-11-09 ENCOUNTER — Ambulatory Visit: Payer: Medicare Other

## 2012-11-09 VITALS — BP 150/90 | HR 99 | Temp 99.6°F | Resp 18 | Wt 219.0 lb

## 2012-11-09 DIAGNOSIS — R509 Fever, unspecified: Secondary | ICD-10-CM

## 2012-11-09 DIAGNOSIS — R05 Cough: Secondary | ICD-10-CM

## 2012-11-09 DIAGNOSIS — R059 Cough, unspecified: Secondary | ICD-10-CM

## 2012-11-09 LAB — POCT CBC
Granulocyte percent: 65.9 %G (ref 37–80)
HCT, POC: 46.3 % (ref 43.5–53.7)
Hemoglobin: 15.4 g/dL (ref 14.1–18.1)
MPV: 9.2 fL (ref 0–99.8)
POC Granulocyte: 2.4 (ref 2–6.9)
POC LYMPH PERCENT: 18.4 %L (ref 10–50)
RDW, POC: 13.6 %

## 2012-11-09 LAB — POCT INFLUENZA A/B
Influenza A, POC: NEGATIVE
Influenza B, POC: POSITIVE

## 2012-11-09 MED ORDER — OSELTAMIVIR PHOSPHATE 75 MG PO CAPS: 75.0000 mg | ORAL_CAPSULE | Freq: Two times a day (BID) | ORAL | Status: DC

## 2012-11-09 MED ORDER — HYDROCODONE-HOMATROPINE 5-1.5 MG/5ML PO SYRP: ORAL_SOLUTION | ORAL | Status: DC

## 2012-11-09 MED ORDER — AZITHROMYCIN 250 MG PO TABS: ORAL_TABLET | ORAL | Status: DC

## 2012-11-09 NOTE — Patient Instructions (Signed)
You have influenza, which is likely the reason for worse cough and fever since last night. Take the tamiflu as prescribed, but can also start the antibiotic for prolonged cough illness in case there is a secondary pneumonia.  mucinex dm as needed during the day, tylenol for headache/fever, and cough syrup at night if needed. Return to the clinic or go to the nearest emergency room if any of your symptoms worsen or new symptoms occur. Influenza, Adult Influenza ("the flu") is a viral infection of the respiratory tract. It occurs more often in winter months because people spend more time in close contact with one another. Influenza can make you feel very sick. Influenza easily spreads from person to person (contagious). CAUSES  Influenza is caused by a virus that infects the respiratory tract. You can catch the virus by breathing in droplets from an infected person's cough or sneeze. You can also catch the virus by touching something that was recently contaminated with the virus and then touching your mouth, nose, or eyes. SYMPTOMS  Symptoms typically last 4 to 10 days and may include:  Fever.  Chills.  Headache, body aches, and muscle aches.  Sore throat.  Chest discomfort and cough.  Poor appetite.  Weakness or feeling tired.  Dizziness.  Nausea or vomiting. DIAGNOSIS  Diagnosis of influenza is often made based on your history and a physical exam. A nose or throat swab test can be done to confirm the diagnosis. RISKS AND COMPLICATIONS You may be at risk for a more severe case of influenza if you smoke cigarettes, have diabetes, have chronic heart disease (such as heart failure) or lung disease (such as asthma), or if you have a weakened immune system. Elderly people and pregnant women are also at risk for more serious infections. The most common complication of influenza is a lung infection (pneumonia). Sometimes, this complication can require emergency medical care and may be  life-threatening. PREVENTION  An annual influenza vaccination (flu shot) is the best way to avoid getting influenza. An annual flu shot is now routinely recommended for all adults in the [place] TREATMENT  In mild cases, influenza goes away on its own. Treatment is directed at relieving symptoms. For more severe cases, your caregiver may prescribe antiviral medicines to shorten the sickness. Antibiotic medicines are not effective, because the infection is caused by a virus, not by bacteria. HOME CARE INSTRUCTIONS  Only take over-the-counter or prescription medicines for pain, discomfort, or fever as directed by your caregiver.  Use a cool mist humidifier to make breathing easier.  Get plenty of rest until your temperature returns to normal. This usually takes 3 to 4 days.  Drink enough fluids to keep your urine clear or pale yellow.  Cover your mouth and nose when coughing or sneezing, and wash your hands well to avoid spreading the virus.  Stay home from work or school until your fever has been gone for at least 1 full day. SEEK MEDICAL CARE IF:   You have chest pain or a deep cough that worsens or produces more mucus.  You have nausea, vomiting, or diarrhea. SEEK IMMEDIATE MEDICAL CARE IF:   You have difficulty breathing, shortness of breath, or your skin or nails turn bluish.  You have severe neck pain or stiffness.  You have a severe headache, facial pain, or earache.  You have a worsening or recurring fever.  You have nausea or vomiting that cannot be controlled. MAKE SURE YOU:  Understand these instructions.  Will watch  your condition.  Will get help right away if you are not doing well or get worse. Document Released: 08/18/2000 Document Revised: 02/20/2012 Document Reviewed: 11/20/2011 [facility] Patient Information 2013 [place], [place].

## 2012-11-09 NOTE — Progress Notes (Signed)
Subjective:    Patient ID: Robert Page, male    DOB: October 07, 1945, 67 y.o.   MRN: [id number]  HPI Robert Page is a 67 y.o. male  Cough. Congestion - off and of for weeks.  Has been able to work in the shop, but persistent cough.  Worse cough last night, ? Sinus drainage, headache. Hot and cold spells last night.   Saw PCP - Dr. Tanya Nones, and diagnosed with influenza about a month or so ago. Had a little sinus drainage and cough since that time.   Tx: nyquil last night, alleve.   Scheduled for sleep study tomorrow night.   Review of Systems  Constitutional: Negative for fever and chills.  HENT: Positive for congestion and postnasal drip.   Respiratory: Positive for cough.   Musculoskeletal: Positive for myalgias (slight).  Neurological: Negative for weakness.   Past Medical History  Diagnosis Date  . Allergy   . Hypertension   . Diverticulosis   . Dupuytren's disease     palms and soles  . Adenomatous polyp   . GERD (gastroesophageal reflux disease)   . Atrial tachycardia    Current Outpatient Prescriptions on File Prior to Visit  Medication Sig Dispense Refill  . fexofenadine (ALLEGRA) 180 MG tablet Take 180 mg by mouth daily as needed.        Marland Kitchen olmesartan (BENICAR) 20 MG tablet Take 1 tablet (20 mg total) by mouth daily.  30 tablet  0  . omeprazole (PRILOSEC) 10 MG capsule Take 10 mg by mouth daily.      . psyllium (REGULOID) 0.52 G capsule Take 0.52 g by mouth daily.       No current facility-administered medications on file prior to visit.   History   Social History  . Marital Status: Married    Spouse Name: Lennon Boutwell    Number of Children: N/A  . Years of Education: N/A   Occupational History  . Not on file.   Social History Main Topics  . Smoking status: Smoker, Current Status Unknown    Types: Cigars  . Smokeless tobacco: Not on file     Comment: Pt states smokes occasional cigar.  This is typically < 1 time per month  . Alcohol Use: No  .  Drug Use: No  . Sexually Active: Not on file   Other Topics Concern  . Not on file   Social History Narrative   Pt lives in [place].  Retired from [employer] and [employer].  Goes on mission trips to [place], gets regular exercise.         Objective:   Physical Exam  Constitutional: He is oriented to person, place, and time. He appears well-developed and well-nourished. No distress.  HENT:  Head: Atraumatic.  Right Ear: Tympanic membrane, external ear and ear canal normal.  Left Ear: Tympanic membrane, external ear and ear canal normal.  Nose: No rhinorrhea. Right sinus exhibits no maxillary sinus tenderness and no frontal sinus tenderness. Left sinus exhibits no maxillary sinus tenderness and no frontal sinus tenderness.  Mouth/Throat: Oropharynx is clear and moist and mucous membranes are normal. No oropharyngeal exudate or posterior oropharyngeal erythema.  Eyes: Conjunctivae are normal. Pupils are equal, round, and reactive to light.  Neck: Neck supple.  Cardiovascular: Normal rate, regular rhythm, normal heart sounds and intact distal pulses.   No murmur heard. Pulmonary/Chest: Effort normal. No accessory muscle usage. Not tachypneic. No respiratory distress. He has no decreased breath sounds. He has no wheezes.  He has rhonchi in the left lower field. He has no rales.  Abdominal: Soft. There is no tenderness.  Lymphadenopathy:    He has no cervical adenopathy.  Neurological: He is alert and oriented to person, place, and time.  Skin: Skin is warm and dry. No rash noted. He is not diaphoretic.  Psychiatric: He has a normal mood and affect. His behavior is normal.   [facility] reading (PRIMARY) by  Dr. Neva Seat: CXR: increased LLL, rll markings, and possible retrocardiac infiltrate on lateral view.  Results for orders placed in visit on 11/09/12  POCT CBC      Result Value Range   WBC 3.7 (*) 4.6 - 10.2 K/uL   Lymph, poc 0.4 (*) 0.6 - 3.4   POC LYMPH PERCENT 18.4  10 - 50 %L   MID  (cbc) 0.6  0 - 0.9   POC MID % 15.7 (*) 0 - 12 %M   POC Granulocyte 2.4  2 - 6.9   Granulocyte percent 65.9  37 - 80 %G   RBC 5.14  4.69 - 6.13 M/uL   Hemoglobin 15.4  14.1 - 18.1 g/dL   HCT, POC 40.9  81.1 - 53.7 %   MCV 90.0  80 - 97 fL   MCH, POC 30.0  27 - 31.2 pg   MCHC 33.3  31.8 - 35.4 g/dL   RDW, POC 91.4     Platelet Count, POC 224  142 - 424 K/uL   MPV 9.2  0 - 99.8 fL  POCT INFLUENZA A/B      Result Value Range   Influenza A, POC Negative     Influenza B, POC Positive         Assessment & Plan:  Robert Page is a 67 y.o. male Cough - Plan: POCT CBC, DG Chest 2 View, POCT Influenza A/B  Fever, unspecified - Plan: POCT Influenza A/B  Persistent cough, with secondary sickening last night, fever, and positive influenza B testing.  Will treat with tamiflu 75mg  BID and Z apk for possible secondary pneumonia. mucinex dm for cough, tylenol for ha. Hycodan qhs prn, and sx care below.   Advised to reschedule sleep study tomorrow.   Rtc/er precautions discussed.   Patient Instructions  You have influenza, which is likely the reason for worse cough and fever since last night. Take the tamiflu as prescribed, but can also start the antibiotic for prolonged cough illness in case there is a secondary pneumonia.  mucinex dm as needed during the day, tylenol for headache/fever, and cough syrup at night if needed. Return to the clinic or go to the nearest emergency room if any of your symptoms worsen or new symptoms occur. Influenza, Adult Influenza ("the flu") is a viral infection of the respiratory tract. It occurs more often in winter months because people spend more time in close contact with one another. Influenza can make you feel very sick. Influenza easily spreads from person to person (contagious). CAUSES  Influenza is caused by a virus that infects the respiratory tract. You can catch the virus by breathing in droplets from an infected person's cough or sneeze. You can also  catch the virus by touching something that was recently contaminated with the virus and then touching your mouth, nose, or eyes. SYMPTOMS  Symptoms typically last 4 to 10 days and may include:  Fever.  Chills.  Headache, body aches, and muscle aches.  Sore throat.  Chest discomfort and cough.  Poor appetite.  Weakness  or feeling tired.  Dizziness.  Nausea or vomiting. DIAGNOSIS  Diagnosis of influenza is often made based on your history and a physical exam. A nose or throat swab test can be done to confirm the diagnosis. RISKS AND COMPLICATIONS You may be at risk for a more severe case of influenza if you smoke cigarettes, have diabetes, have chronic heart disease (such as heart failure) or lung disease (such as asthma), or if you have a weakened immune system. Elderly people and pregnant women are also at risk for more serious infections. The most common complication of influenza is a lung infection (pneumonia). Sometimes, this complication can require emergency medical care and may be life-threatening. PREVENTION  An annual influenza vaccination (flu shot) is the best way to avoid getting influenza. An annual flu shot is now routinely recommended for all adults in the [place] TREATMENT  In mild cases, influenza goes away on its own. Treatment is directed at relieving symptoms. For more severe cases, your caregiver may prescribe antiviral medicines to shorten the sickness. Antibiotic medicines are not effective, because the infection is caused by a virus, not by bacteria. HOME CARE INSTRUCTIONS  Only take over-the-counter or prescription medicines for pain, discomfort, or fever as directed by your caregiver.  Use a cool mist humidifier to make breathing easier.  Get plenty of rest until your temperature returns to normal. This usually takes 3 to 4 days.  Drink enough fluids to keep your urine clear or pale yellow.  Cover your mouth and nose when coughing or sneezing, and wash  your hands well to avoid spreading the virus.  Stay home from work or school until your fever has been gone for at least 1 full day. SEEK MEDICAL CARE IF:   You have chest pain or a deep cough that worsens or produces more mucus.  You have nausea, vomiting, or diarrhea. SEEK IMMEDIATE MEDICAL CARE IF:   You have difficulty breathing, shortness of breath, or your skin or nails turn bluish.  You have severe neck pain or stiffness.  You have a severe headache, facial pain, or earache.  You have a worsening or recurring fever.  You have nausea or vomiting that cannot be controlled. MAKE SURE YOU:  Understand these instructions.  Will watch your condition.  Will get help right away if you are not doing well or get worse. Document Released: 08/18/2000 Document Revised: 02/20/2012 Document Reviewed: 11/20/2011 [facility] Patient Information 2013 [place], [place].

## 2012-11-10 ENCOUNTER — Ambulatory Visit (HOSPITAL_BASED_OUTPATIENT_CLINIC_OR_DEPARTMENT_OTHER): Payer: Medicare Other

## 2012-11-18 ENCOUNTER — Ambulatory Visit (HOSPITAL_BASED_OUTPATIENT_CLINIC_OR_DEPARTMENT_OTHER): Payer: Medicare Other | Attending: Internal Medicine

## 2012-11-18 VITALS — Ht 67.0 in | Wt 215.0 lb

## 2012-11-18 DIAGNOSIS — G4733 Obstructive sleep apnea (adult) (pediatric): Secondary | ICD-10-CM | POA: Diagnosis not present

## 2012-11-29 DIAGNOSIS — G4710 Hypersomnia, unspecified: Secondary | ICD-10-CM

## 2012-11-29 DIAGNOSIS — G4730 Sleep apnea, unspecified: Secondary | ICD-10-CM | POA: Diagnosis not present

## 2012-11-29 NOTE — Procedures (Signed)
Robert Page, Robert Page NO.:  [id number]  MEDICAL RECORD NO.:  [id number]          PATIENT TYPE:  OUT  LOCATION:  SLEEP CENTER                 FACILITY:  [facility]  PHYSICIAN:  Barbaraann Share, MD,FCCPDATE OF BIRTH:  09-23-47  DATE OF STUDY:  11/18/2012                           NOCTURNAL POLYSOMNOGRAM  REFERRING PHYSICIAN:  Hillis Range, MD  INDICATION FOR STUDY:  Hypersomnia with sleep apnea.  EPWORTH SLEEPINESS SCORE:  7.  MEDICATIONS:  SLEEP ARCHITECTURE:  The patient had a total sleep time of 290 minutes with very little slow-wave sleep and only 30 minutes of REM.  Sleep onset latency was mildly prolonged at 36 minutes, and REM onset was normal.  Sleep efficiency was moderately reduced at 74%.  RESPIRATORY DATA:  The patient was found to have 9 apneas and 21 obstructive hypopneas, giving him an AHI of 6 events per hour.  The events occurred in all body positions, but were more prominent during REM.  Moderate snoring noted throughout.  The patient did not meet split night protocol secondary to his small numbers of respiratory events.  OXYGEN DATA:  There was O2 desaturation transiently as low as 80% during the night.  CARDIAC DATA:  Occasional PAC and PVC noted, but no clinically significant arrhythmias were seen.  MOVEMENTS-PARASOMNIA:  The patient had no significant leg jerks or other abnormal behaviors noted.  IMPRESSION-RECOMMENDATION: 1. Minimal obstructive sleep apnea/hypopnea syndrome, with an AHI of 6     events per hour and oxygen desaturation as low as 80%.  It should     be noted the patient's events were much more prominent during REM,     and therefore may more significantly impact his quality of life.     The decision to treat this degree of sleep apnea should be based on     its impact to his quality of life, since it represents very little     risk from a cardiovascular standpoint.  Clinical correlation is     suggested.  The patient  should also be encouraged to work     aggressively on weight loss. 2. Occasional PAC and PVC noted, but no clinically significant     arrhythmia was seen.     Barbaraann Share, MD,FCCP Diplomate, American Board of Sleep Medicine    [facility]  D:  11/29/2012 08:20:24  T:  11/29/2012 23:32:00  Job:  [id number]

## 2012-12-02 ENCOUNTER — Other Ambulatory Visit: Payer: Self-pay | Admitting: Pulmonary Disease

## 2012-12-04 ENCOUNTER — Ambulatory Visit (INDEPENDENT_AMBULATORY_CARE_PROVIDER_SITE_OTHER): Payer: Medicare Other | Admitting: Internal Medicine

## 2012-12-04 ENCOUNTER — Encounter: Payer: Self-pay | Admitting: Internal Medicine

## 2012-12-04 VITALS — BP 142/84 | HR 60 | Ht 69.0 in | Wt 216.0 lb

## 2012-12-04 DIAGNOSIS — I471 Supraventricular tachycardia, unspecified: Secondary | ICD-10-CM

## 2012-12-04 DIAGNOSIS — I498 Other specified cardiac arrhythmias: Secondary | ICD-10-CM | POA: Diagnosis not present

## 2012-12-04 DIAGNOSIS — I10 Essential (primary) hypertension: Secondary | ICD-10-CM

## 2012-12-04 NOTE — Patient Instructions (Addendum)
Your physician wants you to follow-up in: 12 months with Dr Allred You will receive a reminder letter in the mail two months in advance. If you don't receive a letter, please call our office to schedule the follow-up appointment.  

## 2012-12-12 DIAGNOSIS — S92919A Unspecified fracture of unspecified toe(s), initial encounter for closed fracture: Secondary | ICD-10-CM | POA: Diagnosis not present

## 2012-12-12 DIAGNOSIS — S92309A Fracture of unspecified metatarsal bone(s), unspecified foot, initial encounter for closed fracture: Secondary | ICD-10-CM | POA: Diagnosis not present

## 2012-12-14 NOTE — Assessment & Plan Note (Signed)
Stable No change required today  

## 2012-12-14 NOTE — Progress Notes (Signed)
Primary Care Physician: [name] is a 67 y.o. male with a h/o HTN and palpitations who presents for EP follow-up.  His palpitations and fatigue are markedly improved.  Today, he denies symptoms of chest pain, orthopnea, PND, lower extremity edema, dizziness, presyncope, syncope, or neurologic sequela. The patient is tolerating medications without difficulties and is otherwise without complaint today.   Past Medical History  Diagnosis Date  . Allergy   . Hypertension   . Diverticulosis   . Dupuytren's disease     palms and soles  . Adenomatous polyp   . GERD (gastroesophageal reflux disease)   . Atrial tachycardia    Past Surgical History  Procedure Laterality Date  . Lasik  2002  . Colectomy  2010    for diverticulitis    Current Outpatient Prescriptions  Medication Sig Dispense Refill  . fexofenadine (ALLEGRA) 180 MG tablet 180 mg daily.       Marland Kitchen HYDROcodone-homatropine (HYCODAN) 5-1.5 MG/5ML syrup 69m by mouth a bedtime as needed for cough.  120 mL  0  . olmesartan (BENICAR) 20 MG tablet Take 1 tablet (20 mg total) by mouth daily.  30 tablet  0  . omeprazole (PRILOSEC) 10 MG capsule Take 10 mg by mouth daily.      . psyllium (REGULOID) 0.52 G capsule Take 0.52 g by mouth daily.       No current facility-administered medications for this visit.    Allergies  Allergen Reactions  . Ibuprofen     REACTION: stomach cramps  . Sulfonamide Derivatives     REACTION: itch    History   Social History  . Marital Status: Married    Spouse Name: Dameer Speiser    Number of Children: N/A  . Years of Education: N/A   Occupational History  . Not on file.   Social History Main Topics  . Smoking status: Smoker, Current Status Unknown    Types: Cigars  . Smokeless tobacco: Not on file     Comment: Pt states smokes occasional cigar.  This is typically < 1 time per month  . Alcohol Use: No  . Drug Use: No  . Sexually Active: Not on file   Other Topics  Concern  . Not on file   Social History Narrative   Pt lives in [place].  Retired from [employer] and [employer].  Goes on mission trips to [place], gets regular exercise.    Family History  Problem Relation Age of Onset  . Melanoma Sister   . Hypertension Mother   . Hypertension Father   . Macular degeneration Mother   . Stroke      uncle  . Prostate cancer      uncle  . Colon cancer      uncle  . Colon cancer      grandmother  . Stomach cancer Cousin   . Kidney disease Mother   . Kidney disease      grandmother  . Liver cancer      uncle (mets)  . Heart disease Cousin   . Heart disease      uncle  . Heart disease      aunt  . Lung cancer      uncle    ROS- All systems are reviewed and negative except as per the HPI above  Physical Exam: Filed Vitals:   12/04/12 0927  BP: 142/84  Pulse: 60  Height: 5\' 9"  (1.753 m)  Weight: 216 lb (  97.977 kg)    GEN- The patient is well appearing, alert and oriented x 3 today.   Head- normocephalic, atraumatic Eyes-  Sclera clear, conjunctiva pink Ears- hearing intact Oropharynx- clear Neck- supple, no JVP Lymph- no cervical lymphadenopathy Lungs- Clear to ausculation bilaterally, normal work of breathing Heart- Regular rate and rhythm, no murmurs, rubs or gallops, PMI not laterally displaced GI- soft, NT, ND, + BS Extremities- no clubbing, cyanosis, or edema MS- no significant deformity or atrophy Skin- no rash or lesion Psych- euthymic mood, full affect Neuro- strength and sensation are intact  Echo, myoview, and sleep study are reviewed  Assessment and Plan:

## 2012-12-14 NOTE — Assessment & Plan Note (Signed)
The patient has nonsustained SVT.  I suspect that this is atrial tachycardia.  This has improved clinically.  He does not desire ablation or additional medical therpay at this time.  We will therefore follow conservative measures.  He will contact my office if he wishes to make any changes

## 2012-12-19 DIAGNOSIS — S92309A Fracture of unspecified metatarsal bone(s), unspecified foot, initial encounter for closed fracture: Secondary | ICD-10-CM | POA: Diagnosis not present

## 2013-01-22 ENCOUNTER — Telehealth: Payer: Self-pay | Admitting: Family Medicine

## 2013-01-22 DIAGNOSIS — I10 Essential (primary) hypertension: Secondary | ICD-10-CM

## 2013-01-22 NOTE — Telephone Encounter (Signed)
Med was refilled on 01/16/13

## 2013-01-27 ENCOUNTER — Other Ambulatory Visit: Payer: Self-pay | Admitting: Family Medicine

## 2013-02-04 DIAGNOSIS — D2330 Other benign neoplasm of skin of unspecified part of face: Secondary | ICD-10-CM | POA: Diagnosis not present

## 2013-02-04 DIAGNOSIS — L570 Actinic keratosis: Secondary | ICD-10-CM | POA: Diagnosis not present

## 2013-02-11 ENCOUNTER — Encounter: Payer: Self-pay | Admitting: Gastroenterology

## 2013-03-11 ENCOUNTER — Ambulatory Visit
Admission: RE | Admit: 2013-03-11 | Discharge: 2013-03-11 | Disposition: A | Discharge status: Home / Self Care | Payer: Medicare Other | Source: Ambulatory Visit | Attending: Family Medicine | Admitting: Family Medicine

## 2013-03-11 ENCOUNTER — Ambulatory Visit (INDEPENDENT_AMBULATORY_CARE_PROVIDER_SITE_OTHER): Payer: 59 | Admitting: Family Medicine

## 2013-03-11 ENCOUNTER — Encounter: Payer: Self-pay | Admitting: Family Medicine

## 2013-03-11 VITALS — BP 140/80 | HR 82 | Temp 97.7°F | Resp 16 | Ht 69.5 in | Wt 214.0 lb

## 2013-03-11 DIAGNOSIS — Z0000 Encounter for general adult medical examination without abnormal findings: Secondary | ICD-10-CM

## 2013-03-11 DIAGNOSIS — M25529 Pain in unspecified elbow: Secondary | ICD-10-CM

## 2013-03-11 DIAGNOSIS — M25521 Pain in right elbow: Secondary | ICD-10-CM

## 2013-03-11 DIAGNOSIS — Z23 Encounter for immunization: Secondary | ICD-10-CM | POA: Diagnosis not present

## 2013-03-11 DIAGNOSIS — M25539 Pain in unspecified wrist: Secondary | ICD-10-CM | POA: Diagnosis not present

## 2013-03-11 LAB — LIPID PANEL
Cholesterol: 176 mg/dL (ref 0–200)
HDL: 49 mg/dL
LDL Cholesterol: 117 mg/dL — ABNORMAL HIGH (ref 0–99)
Total CHOL/HDL Ratio: 3.6 ratio
Triglycerides: 50 mg/dL
VLDL: 10 mg/dL (ref 0–40)

## 2013-03-11 LAB — CBC WITH DIFFERENTIAL/PLATELET
Basophils Relative: 1 % (ref 0–1)
Eosinophils Absolute: 0.2 10*3/uL (ref 0.0–0.7)
Eosinophils Relative: 4 % (ref 0–5)
Lymphs Abs: 1.2 10*3/uL (ref 0.7–4.0)
MCH: 30 pg (ref 26.0–34.0)
MCHC: 35 g/dL (ref 30.0–36.0)
MCV: 85.7 fL (ref 78.0–100.0)
Monocytes Relative: 10 % (ref 3–12)
Platelets: 244 10*3/uL (ref 150–400)
RBC: 5.03 MIL/uL (ref 4.22–5.81)

## 2013-03-11 LAB — COMPLETE METABOLIC PANEL WITHOUT GFR
ALT: 20 U/L (ref 0–53)
AST: 23 U/L (ref 0–37)
Albumin: 4.4 g/dL (ref 3.5–5.2)
Alkaline Phosphatase: 63 U/L (ref 39–117)
BUN: 18 mg/dL (ref 6–23)
CO2: 23 meq/L (ref 19–32)
Calcium: 9.3 mg/dL (ref 8.4–10.5)
Chloride: 107 meq/L (ref 96–112)
Creat: 1.06 mg/dL (ref 0.50–1.35)
GFR, Est African American: 85 mL/min
GFR, Est Non African American: 73 mL/min
Glucose, Bld: 103 mg/dL — ABNORMAL HIGH (ref 70–99)
Potassium: 4.6 meq/L (ref 3.5–5.3)
Sodium: 140 meq/L (ref 135–145)
Total Bilirubin: 0.5 mg/dL (ref 0.3–1.2)
Total Protein: 6.8 g/dL (ref 6.0–8.3)

## 2013-03-11 LAB — PSA, MEDICARE: PSA: 0.75 ng/mL

## 2013-03-11 MED ORDER — MELOXICAM 15 MG PO TABS: 15.0000 mg | ORAL_TABLET | Freq: Every day | ORAL | Status: DC

## 2013-03-11 NOTE — Progress Notes (Signed)
Subjective:    Patient ID: Robert Page, male    DOB: Jan 23, 1946, 67 y.o.   MRN: [id number]  HPI Patient is here today for complete physical exam.  He also has one concern. He reports 2 months of pain over his right lateral epicondyles. 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  He is very tender over the right condyle it aches and burns at night.  In that he is doing well. He has had no more palpitations in his heart. Past Medical History  Diagnosis Date  . Allergy   . Hypertension   . Diverticulosis   . Dupuytren's disease     palms and soles  . Adenomatous polyp   . GERD (gastroesophageal reflux disease)   . Atrial tachycardia    Past Surgical History  Procedure Laterality Date  . Lasik  2002  . Colectomy  2010    for diverticulitis   Current Outpatient Prescriptions on File Prior to Visit  Medication Sig Dispense Refill  . BENICAR 20 MG tablet TAKE 1 TABLET BY MOUTH DAILY  30 tablet  11  . fexofenadine (ALLEGRA) 180 MG tablet 180 mg daily.       Marland Kitchen omeprazole (PRILOSEC) 10 MG capsule Take 10 mg by mouth daily.      . psyllium (REGULOID) 0.52 G capsule Take 0.52 g by mouth daily.       No current facility-administered medications on file prior to visit.   Allergies  Allergen Reactions  . Ibuprofen     REACTION: stomach cramps  . Sulfonamide Derivatives     REACTION: itch   History   Social History  . Marital Status: Married    Spouse Name: Arun Herrod    Number of Children: N/A  . Years of Education: N/A   Occupational History  . Not on file.   Social History Main Topics  . Smoking status: Smoker, Current Status Unknown    Types: Cigars  . Smokeless tobacco: Not on file     Comment: Pt states smokes occasional cigar.  This is typically < 1 time per month  . Alcohol Use: No  . Drug Use: No  . Sexually Active: Not on file   Other Topics Concern  . Not on file   Social History Narrative   Pt lives in [place].  Retired from [employer] and [employer].  Goes on mission trips to [place], gets regular exercise.   Family History  Problem Relation Age of Onset  . Melanoma Sister   . Hypertension Mother   . Hypertension Father   . Macular degeneration Mother   . Stroke      uncle  . Prostate cancer      uncle  . Colon cancer      uncle  . Colon cancer      grandmother  . Stomach cancer Cousin   . Kidney disease Mother   . Kidney disease      grandmother  . Liver cancer      uncle (mets)  . Heart disease Cousin   . Heart disease      uncle  . Heart disease      aunt  . Lung cancer      uncle      Review of Systems  All other systems reviewed and are negative.       Objective:   Physical Exam  Vitals reviewed. Constitutional: He is oriented to person, place,  and time. He appears well-developed and well-nourished.  HENT:  Head: Normocephalic and atraumatic.  Right Ear: External ear normal.  Left Ear: External ear normal.  Nose: Nose normal.  Mouth/Throat: Oropharynx is clear and moist. No oropharyngeal exudate.  Eyes: Conjunctivae and EOM are normal. Pupils are equal, round, and reactive to light. Right eye exhibits no discharge. Left eye exhibits no discharge. No scleral icterus.  Neck: Normal range of motion. Neck supple. No JVD present. No tracheal deviation present. No thyromegaly present.  Cardiovascular: Normal rate, regular rhythm, normal heart sounds and intact distal pulses.  Exam reveals no gallop and no friction rub.   No murmur heard. Pulmonary/Chest: Effort normal and breath sounds normal. No respiratory distress. He has no wheezes. He has no rales. He exhibits no tenderness.  Abdominal: Soft. Bowel sounds are normal. He exhibits no distension and no mass. There is no tenderness. There is no rebound and no guarding.  Genitourinary: Rectum normal, prostate normal and penis normal.  Musculoskeletal: Normal range of motion. He exhibits tenderness  (Tender to palpation over the right lateral epicondyle). He exhibits no edema.  Lymphadenopathy:    He has no cervical adenopathy.  Neurological: He is alert and oriented to person, place, and time. He has normal reflexes. He displays normal reflexes. No cranial nerve deficit. He exhibits normal muscle tone. Coordination normal.  Skin: Skin is warm and dry. No rash noted. No erythema. No pallor.  Psychiatric: He has a normal mood and affect. His behavior is normal. Judgment and thought content normal.          Assessment & Plan:  1. Routine general medical examination at a health care facility Patient's last colonoscopy was in 2009. He is due. He will schedule this with his regular gastroenterologist. We discussed the Pneumovax and this was administered. We also discussed the shingles shot and he defers for now. His other health maintenance is up to date. His prostate exam was normal. I will check some baseline screening labs. His blood pressure today is borderline. I have asked the patient to check it frequently at home over the next week. If it is consistently 140 or higher I would increase his Benicar. - COMPLETE METABOLIC PANEL WITH GFR - Lipid panel - PSA, Medicare - CBC with Differential  2. Elbow joint pain, right I suspect lateral epicondylitis. I recommended an elbow strap and meloxicam 15 mg by mouth daily for 3 weeks.  If no better I would proceed with a cortisone injection. Also check an x-ray to rule out skeletal pathology. - DG Elbow Complete Right; Future

## 2013-03-19 ENCOUNTER — Encounter: Payer: Self-pay | Admitting: Internal Medicine

## 2013-03-19 ENCOUNTER — Telehealth: Payer: Self-pay | Admitting: Gastroenterology

## 2013-03-19 NOTE — Telephone Encounter (Signed)
Spoke with pt's wife to inform her of the need for an OV. She asked why a COLON was not done earlier. Explained I done know; the recall was entered for 5 years after his COLON in 2009. He then had several more bouts of diverticulitis before he had surgery. I had a hard time finding the op notes/letter as well so that may have been the problem. Informed her I have seen where other MDs here order a COLON one year after surgery. Wife will have pt call back to schedule an OV.

## 2013-03-19 NOTE — Telephone Encounter (Signed)
Pt is questioning his COLON recall. Pt had series of Diverticulitis and finally agreed to a Left Colectomy bu DR Derrell Lolling in November 2010. 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 I cannot find any further documentation of call or OV to our office for this pt. Pt's PCP, Dr Lynnea Ferrier has in his last OV that pt needs to f/u with [place]. Dr Arlyce Dice, continue with Recall and schedule for COLON at [facility]; pt with hx of Atrial Tach this year and a Sleep Study 12/01/12 which I cannot find the results? Thanks.

## 2013-03-19 NOTE — Telephone Encounter (Signed)
He should be seen in the office first.

## 2013-03-26 ENCOUNTER — Other Ambulatory Visit: Payer: Self-pay | Admitting: *Deleted

## 2013-03-26 ENCOUNTER — Ambulatory Visit (AMBULATORY_SURGERY_CENTER): Payer: Medicare Other

## 2013-03-26 ENCOUNTER — Ambulatory Visit: Payer: Medicare Other | Admitting: Gastroenterology

## 2013-03-26 VITALS — Ht 69.5 in | Wt 214.4 lb

## 2013-03-26 DIAGNOSIS — Z800 Family history of malignant neoplasm of digestive organs: Secondary | ICD-10-CM

## 2013-03-26 DIAGNOSIS — Z86010 Personal history of colonic polyps: Secondary | ICD-10-CM

## 2013-03-26 MED ORDER — NA SULFATE-K SULFATE-MG SULF 17.5-3.13-1.6 GM/177ML PO SOLN: 1.0000 | Freq: Once | ORAL | Status: DC

## 2013-03-26 NOTE — Progress Notes (Signed)
Pt received a Recall letter by mistake and came in for the appt. His wife had called on 03/19/13 about seeing [place] for a COLON, so when he received the letter, he thought that was his appt. Liborio Nixon worked him in for a PV and he was scheduled for a COLON next week. Spoke with Dr Arlyce Dice who stated since pt is here and scheduled, continue with procedure next week. Spoke with CRNA Loraine Leriche who assessed chart and he's OK FOR [facility]. Pt stated understanding.

## 2013-04-03 ENCOUNTER — Encounter: Payer: Self-pay | Admitting: Gastroenterology

## 2013-04-03 ENCOUNTER — Ambulatory Visit (AMBULATORY_SURGERY_CENTER): Payer: Medicare Other | Admitting: Gastroenterology

## 2013-04-03 VITALS — BP 146/89 | HR 56 | Temp 97.4°F | Resp 20 | Ht 69.5 in | Wt 214.0 lb

## 2013-04-03 DIAGNOSIS — D126 Benign neoplasm of colon, unspecified: Secondary | ICD-10-CM | POA: Diagnosis not present

## 2013-04-03 DIAGNOSIS — Z86010 Personal history of colonic polyps: Secondary | ICD-10-CM

## 2013-04-03 DIAGNOSIS — Z1211 Encounter for screening for malignant neoplasm of colon: Secondary | ICD-10-CM | POA: Diagnosis not present

## 2013-04-03 DIAGNOSIS — E669 Obesity, unspecified: Secondary | ICD-10-CM | POA: Diagnosis not present

## 2013-04-03 DIAGNOSIS — K5730 Diverticulosis of large intestine without perforation or abscess without bleeding: Secondary | ICD-10-CM

## 2013-04-03 DIAGNOSIS — I10 Essential (primary) hypertension: Secondary | ICD-10-CM | POA: Diagnosis not present

## 2013-04-03 HISTORY — PX: COLONOSCOPY: SHX174

## 2013-04-03 MED ORDER — SODIUM CHLORIDE 0.9 % IV SOLN: 500.0000 mL | INTRAVENOUS | Status: DC

## 2013-04-03 NOTE — Progress Notes (Signed)
Procedure ends, to recovery, report given and VSS. 

## 2013-04-03 NOTE — Progress Notes (Signed)
Patient did not experience any of the following events: a burn prior to discharge; a fall within the facility; wrong site/side/patient/procedure/implant event; or a hospital transfer or hospital admission upon discharge from the facility. (G8907) Patient did not have preoperative order for IV antibiotic SSI prophylaxis. (G8918)  

## 2013-04-03 NOTE — Progress Notes (Signed)
Called to room to assist during endoscopic procedure.  Patient ID and intended procedure confirmed with present staff. Received instructions for my participation in the procedure from the performing physician.  

## 2013-04-03 NOTE — Op Note (Signed)
[facility] [address] [place], [address]   COLONOSCOPY PROCEDURE REPORT  PATIENT: Robert Page, Robert Page  MR#: [id number] BIRTHDATE: [birth year] , 67  yrs. old GENDER: Male ENDOSCOPIST: Louis Meckel, MD REFERRED MW:[id number] Tanya Nones, M.D. PROCEDURE DATE:  04/03/2013 PROCEDURE:   Colonoscopy with snare polypectomy and Colonoscopy with cold biopsy polypectomy First Screening Colonoscopy - Avg.  risk and is 50 yrs.  old or older - No.  Prior Negative Screening - Now for repeat screening. N/A  History of Adenoma - Now for follow-up colonoscopy & has been > or = to 3 yrs.  Yes hx of adenoma.  Has been 3 or more years since last colonoscopy.  Polyps Removed Today? Yes. ASA CLASS:   Class II INDICATIONS:Patient's personal history of adenomatous colon polyps.2009.  s/p segmental colectomy for diverticulitis MEDICATIONS: MAC sedation, administered by CRNA and Propofol (Diprivan) 170 mg IV  DESCRIPTION OF PROCEDURE:   After the risks benefits and alternatives of the procedure were thoroughly explained, informed consent was obtained.  A digital rectal exam revealed no abnormalities of the rectum.   The LB [id number]  endoscope was introduced through the anus and advanced to the cecum, which was identified by both the appendix and ileocecal valve. No adverse events experienced.   The quality of the prep was Suprep good  The instrument was then slowly withdrawn as the colon was fully examined.      COLON FINDINGS: A sessile polyp measuring 2 mm in size was found in the ascending colon.  A polypectomy was performed with cold forceps.   A sessile polyp measuring 3 mm in size was found in the descending colon.  A polypectomy was performed with a cold snare. The resection was complete and the polyp tissue was completely retrieved.   Moderate diverticulosis was noted in the descending colon.   Mild diverticulosis was noted in the transverse colon and ascending colon.   Retroflexed views revealed no abnormalities. The time to cecum=1 minutes 01 seconds.  Withdrawal time=6 minutes 43 seconds.  The scope was withdrawn and the procedure completed. COMPLICATIONS: There were no complications.  ENDOSCOPIC IMPRESSION: 1.   Sessile polyp measuring 2 mm in size was found in the ascending colon; polypectomy was performed with cold forceps 2.   Sessile polyp measuring 3 mm in size was found in the descending colon; polypectomy was performed with a cold snare 3.   Moderate diverticulosis was noted in the descending colon 4.   Mild diverticulosis was noted in the transverse colon and ascending colon  RECOMMENDATIONS: If the polyp(s) removed today are proven to be adenomatous (pre-cancerous) polyps, you will need a repeat colonoscopy in 5 years.  Otherwise you should continue to follow colorectal cancer screening guidelines for "routine risk" patients with colonoscopy in 10 years.  You will receive a letter within 1-2 weeks with the results of your biopsy as well as final recommendations.  Please call my office if you have not received a letter after 3 weeks.   eSigned:  Louis Meckel, MD 04/03/2013 8:56 AM   cc:   PATIENT NAME:  Robert Page, Robert Page MR#: [id number]

## 2013-04-03 NOTE — Patient Instructions (Signed)
YOU HAD AN ENDOSCOPIC PROCEDURE TODAY AT THE  ENDOSCOPY CENTER: Refer to the procedure report that was given to you for any specific questions about what was found during the examination.  If the procedure report does not answer your questions, please call your gastroenterologist to clarify.  If you requested that your care partner not be given the details of your procedure findings, then the procedure report has been included in a sealed envelope for you to review at your convenience later.  YOU SHOULD EXPECT: Some feelings of bloating in the abdomen. Passage of more gas than usual.  Walking can help get rid of the air that was put into your GI tract during the procedure and reduce the bloating. If you had a lower endoscopy (such as a colonoscopy or flexible sigmoidoscopy) you may notice spotting of blood in your stool or on the toilet paper. If you underwent a bowel prep for your procedure, then you may not have a normal bowel movement for a few days.  DIET: Your first meal following the procedure should be a light meal and then it is ok to progress to your normal diet.  A half-sandwich or bowl of soup is an example of a good first meal.  Heavy or fried foods are harder to digest and may make you feel nauseous or bloated.  Likewise meals heavy in dairy and vegetables can cause extra gas to form and this can also increase the bloating.  Drink plenty of fluids but you should avoid alcoholic beverages for 24 hours.  ACTIVITY: Your care partner should take you home directly after the procedure.  You should plan to take it easy, moving slowly for the rest of the day.  You can resume normal activity the day after the procedure however you should NOT DRIVE or use heavy machinery for 24 hours (because of the sedation medicines used during the test).    SYMPTOMS TO REPORT IMMEDIATELY: A gastroenterologist can be reached at any hour.  During normal business hours, 8:30 AM to 5:00 PM Monday through Friday,  call [phone].  After hours and on weekends, please call the GI answering service at [phone] who will take a message and have the physician on call contact you.   Following lower endoscopy (colonoscopy or flexible sigmoidoscopy):  Excessive amounts of blood in the stool  Significant tenderness or worsening of abdominal pains  Swelling of the abdomen that is new, acute  Fever of 100F or higher    FOLLOW UP: If any biopsies were taken you will be contacted by phone or by letter within the next 1-3 weeks.  Call your gastroenterologist if you have not heard about the biopsies in 3 weeks.  Our staff will call the home number listed on your records the next business day following your procedure to check on you and address any questions or concerns that you may have at that time regarding the information given to you following your procedure. This is a courtesy call and so if there is no answer at the home number and we have not heard from you through the emergency physician on call, we will assume that you have returned to your regular daily activities without incident.  SIGNATURES/CONFIDENTIALITY: You and/or your care partner have signed paperwork which will be entered into your electronic medical record.  These signatures attest to the fact that that the information above on your After Visit Summary has been reviewed and is understood.  Full responsibility of the confidentiality   of this discharge information lies with you and/or your care-partner.     INFORMATION ON POLYPS, DIVERTICULOSIS, & HIGH FIBER DIET GIVEN TO YOU TODAY 

## 2013-04-04 ENCOUNTER — Telehealth: Payer: Self-pay | Admitting: *Deleted

## 2013-04-04 NOTE — Telephone Encounter (Signed)
  Follow up Call-  Call back number 04/03/2013  Post procedure Call Back phone  # [phone]  Permission to leave phone message Yes     Patient questions:  Do you have a fever, pain , or abdominal swelling? no Pain Score  0 *  Have you tolerated food without any problems? yes  Have you been able to return to your normal activities? yes  Do you have any questions about your discharge instructions: Diet   no Medications  no Follow up visit  no  Do you have questions or concerns about your Care? no  Actions: * If pain score is 4 or above: No action needed, pain <4.

## 2013-04-14 ENCOUNTER — Encounter: Payer: Self-pay | Admitting: Gastroenterology

## 2013-04-23 DIAGNOSIS — D1801 Hemangioma of skin and subcutaneous tissue: Secondary | ICD-10-CM | POA: Diagnosis not present

## 2013-04-23 DIAGNOSIS — D239 Other benign neoplasm of skin, unspecified: Secondary | ICD-10-CM | POA: Diagnosis not present

## 2013-04-23 DIAGNOSIS — D485 Neoplasm of uncertain behavior of skin: Secondary | ICD-10-CM | POA: Diagnosis not present

## 2013-04-23 DIAGNOSIS — B353 Tinea pedis: Secondary | ICD-10-CM | POA: Diagnosis not present

## 2013-04-23 DIAGNOSIS — L821 Other seborrheic keratosis: Secondary | ICD-10-CM | POA: Diagnosis not present

## 2013-04-23 DIAGNOSIS — L819 Disorder of pigmentation, unspecified: Secondary | ICD-10-CM | POA: Diagnosis not present

## 2013-05-03 ENCOUNTER — Other Ambulatory Visit: Payer: Self-pay | Admitting: Family Medicine

## 2013-06-09 ENCOUNTER — Other Ambulatory Visit: Payer: Self-pay | Admitting: Family Medicine

## 2013-07-10 ENCOUNTER — Other Ambulatory Visit: Payer: Self-pay

## 2013-08-08 ENCOUNTER — Ambulatory Visit (INDEPENDENT_AMBULATORY_CARE_PROVIDER_SITE_OTHER): Payer: Medicare Other | Admitting: Family Medicine

## 2013-08-08 ENCOUNTER — Encounter: Payer: Self-pay | Admitting: Family Medicine

## 2013-08-08 VITALS — BP 130/88 | HR 78 | Temp 97.2°F | Resp 18 | Wt 212.5 lb

## 2013-08-08 DIAGNOSIS — J019 Acute sinusitis, unspecified: Secondary | ICD-10-CM

## 2013-08-08 DIAGNOSIS — R220 Localized swelling, mass and lump, head: Secondary | ICD-10-CM | POA: Diagnosis not present

## 2013-08-08 DIAGNOSIS — R221 Localized swelling, mass and lump, neck: Secondary | ICD-10-CM

## 2013-08-08 DIAGNOSIS — Z23 Encounter for immunization: Secondary | ICD-10-CM

## 2013-08-08 MED ORDER — AZITHROMYCIN 250 MG PO TABS: ORAL_TABLET | ORAL | Status: DC

## 2013-08-08 NOTE — Progress Notes (Signed)
Subjective:    Patient ID: Robert Page, male    DOB: 02-18-1946, 67 y.o.   MRN: [id number]  Headache  Associated symptoms include coughing.  Cough Associated symptoms include headaches.    Patient requests his flu shot today. He reports 3 days of rhinorrhea, congestion, postnasal drip, and cough productive of clear sputum. He denies any fevers or chills or shortness of breath. He also reports a mass on his left posterior neck that has been there for approximately one year. His father was recently diagnosed with lymphoma and he is concerned. Past Medical History  Diagnosis Date  . Allergy   . Hypertension   . Diverticulosis   . Dupuytren's disease     palms and soles  . Adenomatous polyp   . GERD (gastroesophageal reflux disease)   . Atrial tachycardia    Current Outpatient Prescriptions on File Prior to Visit  Medication Sig Dispense Refill  . BENICAR 20 MG tablet TAKE 1 TABLET BY MOUTH DAILY  30 tablet  11  . fexofenadine (ALLEGRA) 180 MG tablet 180 mg daily.       . meloxicam (MOBIC) 15 MG tablet TAKE 1 TABLET (15 MG TOTAL) BY MOUTH DAILY.  30 tablet  1  . meloxicam (MOBIC) 15 MG tablet TAKE 1 TABLET (15 MG TOTAL) BY MOUTH DAILY.  30 tablet  5  . omeprazole (PRILOSEC) 10 MG capsule Take 10 mg by mouth daily.      . psyllium (REGULOID) 0.52 G capsule Take 0.52 g by mouth daily.       No current facility-administered medications on file prior to visit.   Allergies  Allergen Reactions  . Ibuprofen     REACTION: stomach cramps  . Sulfonamide Derivatives     REACTION: itch   History   Social History  . Marital Status: Married    Spouse Name: Jaylan Hinojosa    Number of Children: N/A  . Years of Education: N/A   Occupational History  . Not on file.   Social History Main Topics  . Smoking status: Smoker, Current Status Unknown    Types: Cigars  . Smokeless tobacco: Never Used     Comment: Pt states smokes occasional cigar.  This is typically < 1 time per month    . Alcohol Use: No  . Drug Use: No  . Sexual Activity: Not on file   Other Topics Concern  . Not on file   Social History Narrative   Pt lives in [place].  Retired from [employer] and [employer].  Goes on mission trips to [place], gets regular exercise.   Family History  Problem Relation Age of Onset  . Melanoma Sister   . Hypertension Mother   . Macular degeneration Mother   . Kidney disease Mother   . Hypertension Father   . Stroke      uncle  . Prostate cancer      uncle  . Colon cancer      grandmother/uncle  . Kidney disease      grandmother  . Liver cancer      uncle (mets)  . Heart disease      aunt/uncle  . Lung cancer      uncle  . Stomach cancer Cousin   . Heart disease Cousin   . Colon cancer Paternal Grandmother   . Colon cancer Paternal Uncle      Review of Systems  Respiratory: Positive for cough.   Neurological: Positive for headaches.  All other  systems reviewed and are negative.       Objective:   Physical Exam  Vitals reviewed. HENT:  Right Ear: Tympanic membrane and ear canal normal.  Left Ear: Tympanic membrane and ear canal normal.  Nose: Mucosal edema and rhinorrhea present. Right sinus exhibits no maxillary sinus tenderness and no frontal sinus tenderness. Left sinus exhibits no maxillary sinus tenderness and no frontal sinus tenderness.  Mouth/Throat: Oropharynx is clear and moist.  Eyes: Conjunctivae are normal. Pupils are equal, round, and reactive to light.  Neck: Neck supple.  Cardiovascular: Normal rate, regular rhythm and normal heart sounds.   Pulmonary/Chest: Effort normal and breath sounds normal. No respiratory distress. He has no wheezes. He has no rales.  Lymphadenopathy:    He has no cervical adenopathy.   1.5 cm subcutaneous mass in the posterior left neck consistent with a deep sebaceous cyst versus a superficial posterior cervical chain lymph node. I believe it is more likely a cyst.        Assessment & Plan:  1.  Acute rhinosinusitis I believe the patient has a viral rhinosinusitis. I recommended tincture of time. I did give the patient an antibiotic prescription for Z-Pak with strict instructions not to fill unless symptoms last more than 7 days, he develops a high fever greater than 102, or he develops a purulent cough or purulent nasal discharge - azithromycin (ZITHROMAX) 250 MG tablet; 2 tabs poqday 1, 1 tab poqday 2-5  Dispense: 6 tablet; Refill: 0  2. Mass in neck I will consult ENT for surgical excision. I believe the patient likely has a sebaceous cyst although I cannot exclude a slightly enlarged lymph node. - Ambulatory referral to ENT

## 2013-08-08 NOTE — Addendum Note (Signed)
Addended by: Legrand Rams B on: 08/08/2013 08:33 AM   Modules accepted: Orders

## 2013-08-15 DIAGNOSIS — J019 Acute sinusitis, unspecified: Secondary | ICD-10-CM | POA: Diagnosis not present

## 2013-08-15 DIAGNOSIS — R220 Localized swelling, mass and lump, head: Secondary | ICD-10-CM | POA: Diagnosis not present

## 2013-09-18 DIAGNOSIS — IMO0002 Reserved for concepts with insufficient information to code with codable children: Secondary | ICD-10-CM | POA: Diagnosis not present

## 2013-09-18 DIAGNOSIS — M545 Low back pain, unspecified: Secondary | ICD-10-CM | POA: Diagnosis not present

## 2013-09-18 DIAGNOSIS — M62838 Other muscle spasm: Secondary | ICD-10-CM | POA: Diagnosis not present

## 2013-09-30 DIAGNOSIS — M545 Low back pain, unspecified: Secondary | ICD-10-CM | POA: Diagnosis not present

## 2013-09-30 DIAGNOSIS — M519 Unspecified thoracic, thoracolumbar and lumbosacral intervertebral disc disorder: Secondary | ICD-10-CM | POA: Diagnosis not present

## 2013-09-30 DIAGNOSIS — IMO0002 Reserved for concepts with insufficient information to code with codable children: Secondary | ICD-10-CM | POA: Diagnosis not present

## 2013-10-15 DIAGNOSIS — M47817 Spondylosis without myelopathy or radiculopathy, lumbosacral region: Secondary | ICD-10-CM | POA: Diagnosis not present

## 2013-10-15 DIAGNOSIS — IMO0002 Reserved for concepts with insufficient information to code with codable children: Secondary | ICD-10-CM | POA: Diagnosis not present

## 2013-10-15 DIAGNOSIS — H0550 Retained (old) foreign body following penetrating wound of unspecified orbit: Secondary | ICD-10-CM | POA: Diagnosis not present

## 2013-10-28 DIAGNOSIS — I10 Essential (primary) hypertension: Secondary | ICD-10-CM | POA: Diagnosis not present

## 2013-10-28 DIAGNOSIS — M5126 Other intervertebral disc displacement, lumbar region: Secondary | ICD-10-CM | POA: Diagnosis not present

## 2013-10-29 DIAGNOSIS — IMO0002 Reserved for concepts with insufficient information to code with codable children: Secondary | ICD-10-CM | POA: Diagnosis not present

## 2013-10-29 DIAGNOSIS — M545 Low back pain, unspecified: Secondary | ICD-10-CM | POA: Diagnosis not present

## 2013-10-29 DIAGNOSIS — M519 Unspecified thoracic, thoracolumbar and lumbosacral intervertebral disc disorder: Secondary | ICD-10-CM | POA: Diagnosis not present

## 2013-11-06 DIAGNOSIS — M545 Low back pain, unspecified: Secondary | ICD-10-CM | POA: Diagnosis not present

## 2013-11-06 DIAGNOSIS — IMO0002 Reserved for concepts with insufficient information to code with codable children: Secondary | ICD-10-CM | POA: Diagnosis not present

## 2013-11-06 DIAGNOSIS — M5126 Other intervertebral disc displacement, lumbar region: Secondary | ICD-10-CM | POA: Diagnosis not present

## 2013-11-18 DIAGNOSIS — J019 Acute sinusitis, unspecified: Secondary | ICD-10-CM | POA: Diagnosis not present

## 2013-11-18 DIAGNOSIS — R220 Localized swelling, mass and lump, head: Secondary | ICD-10-CM | POA: Diagnosis not present

## 2013-11-18 DIAGNOSIS — R221 Localized swelling, mass and lump, neck: Secondary | ICD-10-CM | POA: Diagnosis not present

## 2013-11-18 DIAGNOSIS — J301 Allergic rhinitis due to pollen: Secondary | ICD-10-CM | POA: Diagnosis not present

## 2013-11-27 DIAGNOSIS — M545 Low back pain, unspecified: Secondary | ICD-10-CM | POA: Diagnosis not present

## 2013-11-27 DIAGNOSIS — M5126 Other intervertebral disc displacement, lumbar region: Secondary | ICD-10-CM | POA: Diagnosis not present

## 2013-11-27 DIAGNOSIS — M62838 Other muscle spasm: Secondary | ICD-10-CM | POA: Diagnosis not present

## 2013-11-27 DIAGNOSIS — M519 Unspecified thoracic, thoracolumbar and lumbosacral intervertebral disc disorder: Secondary | ICD-10-CM | POA: Diagnosis not present

## 2013-12-22 DIAGNOSIS — IMO0002 Reserved for concepts with insufficient information to code with codable children: Secondary | ICD-10-CM | POA: Diagnosis not present

## 2013-12-22 DIAGNOSIS — I10 Essential (primary) hypertension: Secondary | ICD-10-CM | POA: Diagnosis not present

## 2013-12-22 DIAGNOSIS — Z6831 Body mass index (BMI) 31.0-31.9, adult: Secondary | ICD-10-CM | POA: Diagnosis not present

## 2013-12-30 DIAGNOSIS — IMO0002 Reserved for concepts with insufficient information to code with codable children: Secondary | ICD-10-CM | POA: Diagnosis not present

## 2013-12-30 DIAGNOSIS — M5126 Other intervertebral disc displacement, lumbar region: Secondary | ICD-10-CM | POA: Diagnosis not present

## 2014-01-03 ENCOUNTER — Other Ambulatory Visit: Payer: Self-pay | Admitting: Family Medicine

## 2014-01-19 DIAGNOSIS — R39198 Other difficulties with micturition: Secondary | ICD-10-CM | POA: Diagnosis not present

## 2014-03-16 ENCOUNTER — Encounter: Payer: Self-pay | Admitting: Family Medicine

## 2014-03-16 ENCOUNTER — Ambulatory Visit (INDEPENDENT_AMBULATORY_CARE_PROVIDER_SITE_OTHER): Payer: Medicare Other | Admitting: Family Medicine

## 2014-03-16 VITALS — BP 130/76 | HR 64 | Temp 97.4°F | Resp 16 | Ht 69.5 in | Wt 211.0 lb

## 2014-03-16 DIAGNOSIS — I10 Essential (primary) hypertension: Secondary | ICD-10-CM | POA: Diagnosis not present

## 2014-03-16 DIAGNOSIS — Z79899 Other long term (current) drug therapy: Secondary | ICD-10-CM

## 2014-03-16 DIAGNOSIS — Z0000 Encounter for general adult medical examination without abnormal findings: Secondary | ICD-10-CM

## 2014-03-16 DIAGNOSIS — Z125 Encounter for screening for malignant neoplasm of prostate: Secondary | ICD-10-CM

## 2014-03-16 LAB — COMPLETE METABOLIC PANEL WITH GFR
ALK PHOS: 64 U/L (ref 39–117)
ALT: 16 U/L (ref 0–53)
AST: 17 U/L (ref 0–37)
Albumin: 4.6 g/dL (ref 3.5–5.2)
BILIRUBIN TOTAL: 0.6 mg/dL (ref 0.2–1.2)
BUN: 13 mg/dL (ref 6–23)
CO2: 29 meq/L (ref 19–32)
Calcium: 9.7 mg/dL (ref 8.4–10.5)
Chloride: 102 mEq/L (ref 96–112)
Creat: 1.05 mg/dL (ref 0.50–1.35)
GFR, Est African American: 85 mL/min
GFR, Est Non African American: 74 mL/min
Glucose, Bld: 97 mg/dL (ref 70–99)
POTASSIUM: 5.1 meq/L (ref 3.5–5.3)
SODIUM: 140 meq/L (ref 135–145)
TOTAL PROTEIN: 7 g/dL (ref 6.0–8.3)

## 2014-03-16 LAB — CBC WITH DIFFERENTIAL/PLATELET
BASOS PCT: 1 % (ref 0–1)
Basophils Absolute: 0.1 10*3/uL (ref 0.0–0.1)
EOS ABS: 0.1 10*3/uL (ref 0.0–0.7)
EOS PCT: 2 % (ref 0–5)
HEMATOCRIT: 45.6 % (ref 39.0–52.0)
HEMOGLOBIN: 16 g/dL (ref 13.0–17.0)
Lymphocytes Relative: 22 % (ref 12–46)
Lymphs Abs: 1.4 10*3/uL (ref 0.7–4.0)
MCH: 31.3 pg (ref 26.0–34.0)
MCHC: 35.1 g/dL (ref 30.0–36.0)
MCV: 89.1 fL (ref 78.0–100.0)
MONO ABS: 0.5 10*3/uL (ref 0.1–1.0)
MONOS PCT: 8 % (ref 3–12)
Neutro Abs: 4.4 10*3/uL (ref 1.7–7.7)
Neutrophils Relative %: 67 % (ref 43–77)
Platelets: 274 10*3/uL (ref 150–400)
RBC: 5.12 MIL/uL (ref 4.22–5.81)
RDW: 13.2 % (ref 11.5–15.5)
WBC: 6.5 10*3/uL (ref 4.0–10.5)

## 2014-03-16 LAB — LIPID PANEL
Cholesterol: 180 mg/dL (ref 0–200)
HDL: 54 mg/dL (ref >39)
LDL Cholesterol: 109 mg/dL — ABNORMAL HIGH (ref 0–99)
Total CHOL/HDL Ratio: 3.3 Ratio
Triglycerides: 83 mg/dL (ref <150)
VLDL: 17 mg/dL (ref 0–40)

## 2014-03-16 NOTE — Progress Notes (Signed)
Subjective:    Patient ID: Robert Page, male    DOB: Oct 28, 1945, 68 y.o.   MRN: [id number]  HPI Patient is a very pleasant 68 year old white male he comes in today for a regular physical exam. He has no medical concerns. His last colonoscopy was in 2014. He was significant for colon polyps he is recommended to have a repeat colonoscopy in 2019. Patient has had Pneumovax 23. He is due for Prevnar 13. He is also due for Zostavax. However the patient has a history of shingles on the left side of his face. His last documented tetanus was in 2005. 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 Patient believes he may have had a tetanus shot since 2005 in urgent care. He will check on that. He is due for fasting lab work. Patient's urologist in May. At that time a digital rectal exam was performed and his prostate was normal. He is overdue for PSA. Past Medical History  Diagnosis Date  . Allergy   . Hypertension   . Diverticulosis   . Dupuytren's disease     palms and soles  . Adenomatous polyp   . GERD (gastroesophageal reflux disease)   . Atrial tachycardia    Past Surgical History  Procedure Laterality Date  . Lasik  2002    Bil  . Colectomy  2010    for diverticulitis   Current Outpatient Prescriptions on File Prior to Visit  Medication Sig Dispense Refill  . BENICAR 20 MG tablet TAKE 1 TABLET BY MOUTH EVERY DAY  30 tablet  11  . fexofenadine (ALLEGRA) 180 MG tablet 180 mg daily.       Marland Kitchen omeprazole (PRILOSEC) 10 MG capsule Take 10 mg by mouth daily.       No current facility-administered medications on file prior to visit.   Allergies  Allergen Reactions  . Ibuprofen     REACTION: stomach cramps  . Sulfonamide Derivatives     REACTION: itch   History   Social History  . Marital Status: Married    Spouse Name: Wilmont Olund    Number of Children: N/A  . Years of Education: N/A   Occupational History  . Not on file.   Social History  Main Topics  . Smoking status: Smoker, Current Status Unknown    Types: Cigars  . Smokeless tobacco: Never Used     Comment: Pt states smokes occasional cigar.  This is typically < 1 time per month  . Alcohol Use: No  . Drug Use: No  . Sexual Activity: Not on file   Other Topics Concern  . Not on file   Social History Narrative   Pt lives in [place].  Retired from [employer] and [employer].  Goes on mission trips to [place], gets regular exercise.   Family History  Problem Relation Age of Onset  . Melanoma Sister   . Hypertension Mother   . Macular degeneration Mother   . Kidney disease Mother   . Hypertension Father   . Stroke      uncle  . Prostate cancer      uncle  . Colon cancer      grandmother/uncle  . Kidney disease      grandmother  . Liver cancer      uncle (mets)  . Heart disease      aunt/uncle  . Lung cancer      uncle  . Stomach cancer Cousin   .  Heart disease Cousin   . Colon cancer Paternal Grandmother   . Colon cancer Paternal Uncle      Review of Systems  All other systems reviewed and are negative.      Objective:   Physical Exam  Vitals reviewed. Constitutional: He is oriented to person, place, and time. He appears well-developed and well-nourished. No distress.  HENT:  Head: Normocephalic and atraumatic.  Right Ear: External ear normal.  Left Ear: External ear normal.  Nose: Nose normal.  Mouth/Throat: Oropharynx is clear and moist. No oropharyngeal exudate.  Eyes: Conjunctivae and EOM are normal. Pupils are equal, round, and reactive to light. Right eye exhibits no discharge. Left eye exhibits no discharge. No scleral icterus.  Neck: Normal range of motion. Neck supple. No JVD present. No tracheal deviation present. No thyromegaly present.  Cardiovascular: Normal rate, regular rhythm, normal heart sounds and intact distal pulses.  Exam reveals no gallop and no friction rub.   No murmur heard. Pulmonary/Chest: Effort normal and breath  sounds normal. No stridor. No respiratory distress. He has no wheezes. He has no rales. He exhibits no tenderness.  Abdominal: Soft. Bowel sounds are normal. He exhibits no distension and no mass. There is no tenderness. There is no rebound and no guarding.  Musculoskeletal: Normal range of motion. He exhibits no edema and no tenderness.  Lymphadenopathy:    He has no cervical adenopathy.  Neurological: He is alert and oriented to person, place, and time. He has normal reflexes. He displays normal reflexes. No cranial nerve deficit. He exhibits normal muscle tone. Coordination normal.  Skin: Skin is warm. No rash noted. He is not diaphoretic. No erythema. No pallor.  Psychiatric: He has a normal mood and affect. His behavior is normal. Judgment and thought content normal.          Assessment & Plan:  1. Encounter for long-term (current) use of other medications Patient's physical exam is completely normal. He received Prevnar 13 today in the office. His cancer screening is up to date. I will repeat a PSA. I have recommended a Zostavax. Patient will check on the date of his last tetanus shot. He can come back any time for a booster the stent site he desires. - COMPLETE METABOLIC PANEL WITH GFR - CBC with Differential - Lipid panel - PSA, Medicare  2. Prostate cancer screening - PSA, Medicare  3. Routine general medical examination at a health care facility

## 2014-03-17 LAB — PSA, MEDICARE: PSA: 0.78 ng/mL (ref <=4.00)

## 2014-04-23 DIAGNOSIS — R39198 Other difficulties with micturition: Secondary | ICD-10-CM | POA: Diagnosis not present

## 2014-05-27 DIAGNOSIS — D1801 Hemangioma of skin and subcutaneous tissue: Secondary | ICD-10-CM | POA: Diagnosis not present

## 2014-05-27 DIAGNOSIS — D235 Other benign neoplasm of skin of trunk: Secondary | ICD-10-CM | POA: Diagnosis not present

## 2014-05-27 DIAGNOSIS — L905 Scar conditions and fibrosis of skin: Secondary | ICD-10-CM | POA: Diagnosis not present

## 2014-05-27 DIAGNOSIS — D2360 Other benign neoplasm of skin of unspecified upper limb, including shoulder: Secondary | ICD-10-CM | POA: Diagnosis not present

## 2014-05-27 DIAGNOSIS — L821 Other seborrheic keratosis: Secondary | ICD-10-CM | POA: Diagnosis not present

## 2014-06-18 ENCOUNTER — Ambulatory Visit (INDEPENDENT_AMBULATORY_CARE_PROVIDER_SITE_OTHER): Payer: Medicare Other | Admitting: Family Medicine

## 2014-06-18 ENCOUNTER — Encounter: Payer: Self-pay | Admitting: Family Medicine

## 2014-06-18 VITALS — BP 128/74 | HR 82 | Temp 97.8°F | Resp 18 | Ht 69.5 in | Wt 212.0 lb

## 2014-06-18 DIAGNOSIS — S76112A Strain of left quadriceps muscle, fascia and tendon, initial encounter: Secondary | ICD-10-CM | POA: Diagnosis not present

## 2014-06-18 MED ORDER — PREDNISONE 20 MG PO TABS: ORAL_TABLET | ORAL | Status: DC

## 2014-06-18 MED ORDER — DIAZEPAM 10 MG PO TABS: 10.0000 mg | ORAL_TABLET | Freq: Two times a day (BID) | ORAL | Status: DC | PRN

## 2014-06-18 NOTE — Progress Notes (Signed)
Subjective:    Patient ID: Robert Page, male    DOB: 03/09/1946, 68 y.o.   MRN: [id number]  HPI Patient was fine until yesterday. 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 Yesterday he developed the acute onset of pain in his inferior quadriceps at the lateral attachment just superior to the knee. The area is swollen with palpable muscle spasm. He reports moderate to severe pain in this area ambulation. He reports pain with knee extension. He denies any laxity in the knee joint or locking in the knee joint. He denies any pain in the joint. He does have some tenderness to palpation in his left calf as well. He has full range of motion in his ankle. There is no evidence of Achilles rupture. There is no pitting edema in the leg to suggest a DVT. He denies any shortness of breath or chest pain. He also has no risk factors for DVT. Past Medical History  Diagnosis Date  . Allergy   . Hypertension   . Diverticulosis   . Dupuytren's disease     palms and soles  . Adenomatous polyp   . GERD (gastroesophageal reflux disease)   . Atrial tachycardia    Past Surgical History  Procedure Laterality Date  . Lasik  2002    Bil  . Colectomy  2010    for diverticulitis   Current Outpatient Prescriptions on File Prior to Visit  Medication Sig Dispense Refill  . BENICAR 20 MG tablet TAKE 1 TABLET BY MOUTH EVERY DAY  30 tablet  11  . fexofenadine (ALLEGRA) 180 MG tablet 180 mg daily.       Marland Kitchen omeprazole (PRILOSEC) 10 MG capsule Take 10 mg by mouth daily.       No current facility-administered medications on file prior to visit.   Allergies  Allergen Reactions  . Ibuprofen     REACTION: stomach cramps  . Sulfonamide Derivatives     REACTION: itch   History   Social History  . Marital Status: Married    Spouse Name: Jawara Latorre    Number of Children: N/A  . Years of Education: N/A   Occupational History  . Not on file.   Social History  Main Topics  . Smoking status: Smoker, Current Status Unknown    Types: Cigars  . Smokeless tobacco: Never Used     Comment: Pt states smokes occasional cigar.  This is typically < 1 time per month  . Alcohol Use: No  . Drug Use: No  . Sexual Activity: Not on file   Other Topics Concern  . Not on file   Social History Narrative   Pt lives in [place].  Retired from [employer] and [employer].  Goes on mission trips to [place], gets regular exercise.      Review of Systems  All other systems reviewed and are negative.      Objective:   Physical Exam  Vitals reviewed. Cardiovascular: Normal rate and regular rhythm.   Pulmonary/Chest: Effort normal and breath sounds normal.  Musculoskeletal: He exhibits tenderness.   patient has significant tenderness to palpation over the inferior lateral quadriceps in his left leg near its attachment just superior to the knee there is a palpable muscle spasm in this area. Pain is exacerbated by bearing weight and extending the knee against resistance. He does have 5 muscle strength to resisted knee extension and hip flexion. There is normal range of motion  in the knee and the ankle. He has a negative anterior sign, negative posterior drawer sign, negative Apley grind, there is no laxity in the knee joint to varus or valgus stress        Assessment & Plan:  Quadriceps strain, left, initial encounter - Plan: predniSONE (DELTASONE) 20 MG tablet, diazepam (VALIUM) 10 MG tablet  Begin Valium 10 mg every 8 hours as needed for muscle spasm. Begin prednisone taper pack for inflammation in the partial tear/strain quadricep. Recheck in 3 days or immediately if worse. I anticipate gradual improvement over the next 2-3 weeks. Recheck immediately if symptoms change or worsen.

## 2015-01-08 ENCOUNTER — Telehealth: Payer: Self-pay | Admitting: Family Medicine

## 2015-01-08 MED ORDER — OLMESARTAN MEDOXOMIL 20 MG PO TABS: 20.0000 mg | ORAL_TABLET | Freq: Every day | ORAL | Status: DC

## 2015-01-08 NOTE — Telephone Encounter (Signed)
Pt came to office with empty Benicar bottle.  Upset because his pharmacy told him they contacted [place] 6 days ago for refill and we have not responded.  Pt told we have not rec'd any request from his pharmacy and apologized for the delay in his refill.  Refills done now to hold him until his next CPE in July.  Pt made that appt on his way out.

## 2015-02-11 ENCOUNTER — Encounter: Payer: Self-pay | Admitting: Gastroenterology

## 2015-03-01 ENCOUNTER — Other Ambulatory Visit: Payer: Self-pay

## 2015-03-14 ENCOUNTER — Ambulatory Visit (INDEPENDENT_AMBULATORY_CARE_PROVIDER_SITE_OTHER): Payer: Medicare Other | Admitting: Urgent Care

## 2015-03-14 ENCOUNTER — Ambulatory Visit (INDEPENDENT_AMBULATORY_CARE_PROVIDER_SITE_OTHER): Payer: Medicare Other

## 2015-03-14 VITALS — BP 158/84 | HR 68 | Temp 97.8°F | Resp 18 | Ht 70.0 in | Wt 219.8 lb

## 2015-03-14 DIAGNOSIS — R0602 Shortness of breath: Secondary | ICD-10-CM | POA: Diagnosis not present

## 2015-03-14 DIAGNOSIS — R938 Abnormal findings on diagnostic imaging of other specified body structures: Secondary | ICD-10-CM | POA: Diagnosis not present

## 2015-03-14 DIAGNOSIS — R222 Localized swelling, mass and lump, trunk: Secondary | ICD-10-CM

## 2015-03-14 DIAGNOSIS — R5383 Other fatigue: Secondary | ICD-10-CM

## 2015-03-14 DIAGNOSIS — R05 Cough: Secondary | ICD-10-CM | POA: Diagnosis not present

## 2015-03-14 DIAGNOSIS — J22 Unspecified acute lower respiratory infection: Secondary | ICD-10-CM

## 2015-03-14 DIAGNOSIS — R918 Other nonspecific abnormal finding of lung field: Secondary | ICD-10-CM | POA: Insufficient documentation

## 2015-03-14 DIAGNOSIS — J988 Other specified respiratory disorders: Secondary | ICD-10-CM

## 2015-03-14 DIAGNOSIS — I10 Essential (primary) hypertension: Secondary | ICD-10-CM | POA: Diagnosis not present

## 2015-03-14 DIAGNOSIS — R059 Cough, unspecified: Secondary | ICD-10-CM

## 2015-03-14 DIAGNOSIS — R9389 Abnormal findings on diagnostic imaging of other specified body structures: Secondary | ICD-10-CM

## 2015-03-14 MED ORDER — AZITHROMYCIN 250 MG PO TABS: 250.0000 mg | ORAL_TABLET | Freq: Every day | ORAL | Status: AC

## 2015-03-14 MED ORDER — HYDROCODONE-HOMATROPINE 5-1.5 MG/5ML PO SYRP: 5.0000 mL | ORAL_SOLUTION | Freq: Every evening | ORAL | Status: DC | PRN

## 2015-03-14 NOTE — Addendum Note (Signed)
Addended by: Jaynee Eagles on: 03/14/2015 12:43 PM   Modules accepted: Orders

## 2015-03-14 NOTE — Patient Instructions (Signed)

## 2015-03-14 NOTE — Progress Notes (Addendum)
MRN: [id number] DOB: 03/06/1946  Subjective:   Robert Page is a 69 y.o. male presenting for chief complaint of Cough  Reports several month history of intermittent cough, worse in the past 4 days, now having coughing fits, interrupting his sleep, elicits shob, has decreased energy, malaise. Patient is scheduled to see his PCP on 03/22/2015 for annual phyhsical. He is on losartan and has had trouble with his BP medicine causing cough in the past but he does not want to make any changes now until he can discuss this with his PCP. Patient has a history of allergies and takes Allegra for this. Denies fevers, congestion, sinus pain, itchy or watery red eyes, ear pain, ear drainage, sore throat, chest pain, chest tightness, n/v, abdominal pain. Denies smoking. Denies any other aggravating or relieving factors, no other questions or concerns.  Robert Page has a current medication list which includes the following prescription(s): fexofenadine, olmesartan, and omeprazole. He is allergic to ibuprofen and sulfonamide derivatives.  Nuh  has a past medical history of Allergy; Hypertension; Diverticulosis; Dupuytren's disease; Adenomatous polyp; GERD (gastroesophageal reflux disease); and Atrial tachycardia. Also  has past surgical history that includes LASIK (2002); Colectomy (2010); and Back surgery.  ROS As in subjective.  Objective:   Vitals: BP 158/84 mmHg  Pulse 68  Temp(Src) 97.8 F (36.6 C) (Oral)  Resp 18  Ht 5\' 10"  (1.778 m)  Wt 219 lb 12.8 oz (99.701 kg)  BMI 31.54 kg/m2  SpO2 98%  Physical Exam  Constitutional: He is oriented to person, place, and time. He appears well-developed and well-nourished.  HENT:  TM's intact bilaterally, no effusions or erythema. Nares patent, nasal turbinates pink and moist. No sinus tenderness. Oropharynx clear, mucous membranes moist, dentition in good repair.  Eyes: Conjunctivae are normal. Pupils are equal, round, and reactive to light. Right eye  exhibits no discharge. Left eye exhibits no discharge. No scleral icterus.  Neck: Normal range of motion. Neck supple.  Cardiovascular: Normal rate, regular rhythm and intact distal pulses.  Exam reveals no gallop and no friction rub.   No murmur heard. Pulmonary/Chest: No stridor. No respiratory distress. He has no wheezes. He has rales (lung sounds slightly coarse at lower lung bases).  Lymphadenopathy:    He has no cervical adenopathy.  Neurological: He is alert and oriented to person, place, and time.  Skin: Skin is warm and dry. No rash noted. No erythema. No pallor.   [facility] reading (PRIMARY) by  Dr. Brigitte Pulse and PA-Reannah Totten. Chest - retrosternal density seen on lateral view (please comment), coarsening/increased markings in right field.  Assessment and Plan :   1. Cough 2. Shortness of breath 3. Other fatigue 4. Lower respiratory tract infection - Will cover for infectious process, Hycodan for symptomatic relief. However, cough may be due to Benicar, see #6 below. F/u as needed.  5. Abnormal chest x-ray - Radiologist over-read pending, f/u with PCP  6. Essential hypertension - Uncontrolled, patient refused any changes to regimen, will f/u with PCP  Jaynee Eagles, PA-C [facility] [phone] 03/14/2015 11:23 AM     UPDATE:   Dg Chest 2 View  03/14/2015   CLINICAL DATA:  Patient with several month history of intermittent cough.  EXAM: CHEST  2 VIEW  COMPARISON:  Chest radiograph 11/09/2012  FINDINGS: Normal cardiac and mediastinal contours. Tortuosity of the thoracic aorta. No consolidative pulmonary opacities. Nodular density within the retrosternal location on lateral view, nonspecific. Degenerative changes of  the mid thoracic spine.  IMPRESSION: Nonspecific nodular density in the retrosternal location on lateral view, recommend correlation with chest CT.  No acute cardiopulmonary process.   Electronically Signed   By: Lovey Newcomer M.D.    On: 03/14/2015 12:23   Discussed case with patient, he was reluctant but ended up agreeing to chest CT. Will forward results to his PCP.

## 2015-03-19 ENCOUNTER — Ambulatory Visit
Admission: RE | Admit: 2015-03-19 | Discharge: 2015-03-19 | Disposition: A | Discharge status: Home / Self Care | Payer: Medicare Other | Source: Ambulatory Visit | Attending: Urgent Care | Admitting: Urgent Care

## 2015-03-19 DIAGNOSIS — R9389 Abnormal findings on diagnostic imaging of other specified body structures: Secondary | ICD-10-CM

## 2015-03-19 DIAGNOSIS — R222 Localized swelling, mass and lump, trunk: Secondary | ICD-10-CM

## 2015-03-19 DIAGNOSIS — R918 Other nonspecific abnormal finding of lung field: Secondary | ICD-10-CM | POA: Diagnosis not present

## 2015-03-22 ENCOUNTER — Encounter: Payer: Self-pay | Admitting: Family Medicine

## 2015-03-22 ENCOUNTER — Ambulatory Visit (INDEPENDENT_AMBULATORY_CARE_PROVIDER_SITE_OTHER): Payer: Medicare Other | Admitting: Family Medicine

## 2015-03-22 VITALS — BP 136/74 | HR 64 | Temp 98.1°F | Resp 16 | Ht 69.5 in | Wt 216.0 lb

## 2015-03-22 DIAGNOSIS — Z125 Encounter for screening for malignant neoplasm of prostate: Secondary | ICD-10-CM | POA: Diagnosis not present

## 2015-03-22 DIAGNOSIS — J208 Acute bronchitis due to other specified organisms: Secondary | ICD-10-CM | POA: Diagnosis not present

## 2015-03-22 DIAGNOSIS — R911 Solitary pulmonary nodule: Secondary | ICD-10-CM

## 2015-03-22 DIAGNOSIS — Z0000 Encounter for general adult medical examination without abnormal findings: Secondary | ICD-10-CM

## 2015-03-22 DIAGNOSIS — I10 Essential (primary) hypertension: Secondary | ICD-10-CM

## 2015-03-22 LAB — LIPID PANEL
CHOLESTEROL: 184 mg/dL (ref 0–200)
HDL: 49 mg/dL (ref >=40)
LDL CALC: 116 mg/dL — AB (ref 0–99)
Total CHOL/HDL Ratio: 3.8 Ratio
Triglycerides: 94 mg/dL (ref <150)
VLDL: 19 mg/dL (ref 0–40)

## 2015-03-22 LAB — COMPLETE METABOLIC PANEL WITH GFR
ALT: 19 U/L (ref 0–53)
AST: 17 U/L (ref 0–37)
Albumin: 4 g/dL (ref 3.5–5.2)
Alkaline Phosphatase: 59 U/L (ref 39–117)
BILIRUBIN TOTAL: 0.4 mg/dL (ref 0.2–1.2)
BUN: 16 mg/dL (ref 6–23)
CO2: 24 meq/L (ref 19–32)
Calcium: 9.7 mg/dL (ref 8.4–10.5)
Chloride: 107 mEq/L (ref 96–112)
Creat: 0.99 mg/dL (ref 0.50–1.35)
GFR, EST NON AFRICAN AMERICAN: 78 mL/min
GLUCOSE: 106 mg/dL — AB (ref 70–99)
POTASSIUM: 4.4 meq/L (ref 3.5–5.3)
Sodium: 141 mEq/L (ref 135–145)
TOTAL PROTEIN: 6.9 g/dL (ref 6.0–8.3)

## 2015-03-22 LAB — CBC WITH DIFFERENTIAL/PLATELET
BASOS ABS: 0.1 10*3/uL (ref 0.0–0.1)
Basophils Relative: 1 % (ref 0–1)
EOS PCT: 4 % (ref 0–5)
Eosinophils Absolute: 0.2 10*3/uL (ref 0.0–0.7)
HCT: 45.9 % (ref 39.0–52.0)
Hemoglobin: 15.4 g/dL (ref 13.0–17.0)
Lymphocytes Relative: 21 % (ref 12–46)
Lymphs Abs: 1.3 10*3/uL (ref 0.7–4.0)
MCH: 29.7 pg (ref 26.0–34.0)
MCHC: 33.6 g/dL (ref 30.0–36.0)
MCV: 88.6 fL (ref 78.0–100.0)
MPV: 9.8 fL (ref 8.6–12.4)
Monocytes Absolute: 0.4 10*3/uL (ref 0.1–1.0)
Monocytes Relative: 7 % (ref 3–12)
NEUTROS ABS: 4 10*3/uL (ref 1.7–7.7)
NEUTROS PCT: 67 % (ref 43–77)
Platelets: 248 10*3/uL (ref 150–400)
RBC: 5.18 MIL/uL (ref 4.22–5.81)
RDW: 13.6 % (ref 11.5–15.5)
WBC: 6 10*3/uL (ref 4.0–10.5)

## 2015-03-22 MED ORDER — PREDNISONE 20 MG PO TABS: ORAL_TABLET | ORAL | Status: DC

## 2015-03-22 MED ORDER — ALBUTEROL SULFATE HFA 108 (90 BASE) MCG/ACT IN AERS: 2.0000 | INHALATION_SPRAY | Freq: Four times a day (QID) | RESPIRATORY_TRACT | Status: DC | PRN

## 2015-03-22 MED ORDER — LOSARTAN POTASSIUM 100 MG PO TABS: 100.0000 mg | ORAL_TABLET | Freq: Every day | ORAL | Status: DC

## 2015-03-22 NOTE — Progress Notes (Signed)
Subjective:    Patient ID: Robert Page, male    DOB: 13-Oct-1945, 69 y.o.   MRN: [id number]  HPI Here for CPE.  Last colonoscopy 2014, significant for polyps, next colonoscopy is due in 2019.  Patient has had pneumovax in 2014.  Patient did not receive Prevnar 13. He is due for that this year. However the patient reports a cough that has been present for 6 months. Recently it has gotten worse. He went to an urgent care and was given Z-Pak. An x-ray was obtained there which was normal. Patient had a CT scan of the chest which revealed 25 mm pulmonary nodules in the right loading but no evidence of pneumonia. Given his smoking history he will be due for repeat CT scan of the chest in 6 months. However there is no explanation for this  chronic cough. 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 He does report some wheezing with the cough. The cough comes in spells. He denies any exposure to TB. He is unsure of any exposure to whooping cough. He does report severe daily acid reflux. He denies any hemoptysis. Past Medical History  Diagnosis Date  . Allergy   . Hypertension   . Diverticulosis   . Dupuytren's disease     palms and soles  . Adenomatous polyp   . GERD (gastroesophageal reflux disease)   . Atrial tachycardia    Past Surgical History  Procedure Laterality Date  . Lasik  2002    Bil  . Colectomy  2010    for diverticulitis  . Back surgery      12/2013   Current Outpatient Prescriptions on File Prior to Visit  Medication Sig Dispense Refill  . fexofenadine (ALLEGRA) 180 MG tablet 180 mg daily.     Marland Kitchen HYDROcodone-homatropine (HYCODAN) 5-1.5 MG/5ML syrup Take 5 mLs by mouth at bedtime as needed. 120 mL 0  . olmesartan (BENICAR) 20 MG tablet Take 1 tablet (20 mg total) by mouth daily. 30 tablet 3  . omeprazole (PRILOSEC) 10 MG capsule Take 10 mg by mouth daily.     No current facility-administered medications on file prior to visit.   Allergies   Allergen Reactions  . Ibuprofen     REACTION: stomach cramps  . Sulfonamide Derivatives     REACTION: itch   History   Social History  . Marital Status: Married    Spouse Name: Rainey Kahrs  . Number of Children: N/A  . Years of Education: N/A   Occupational History  . Not on file.   Social History Main Topics  . Smoking status: Light Tobacco Smoker    Types: Cigars  . Smokeless tobacco: Never Used     Comment: Pt states smokes occasional cigar.  This is typically < 1 time per month  . Alcohol Use: No  . Drug Use: No  . Sexual Activity: Not on file   Other Topics Concern  . Not on file   Social History Narrative   Pt lives in [place].  Retired from [employer] and [employer].  Goes on mission trips to [place], gets regular exercise.   Family History  Problem Relation Age of Onset  . Melanoma Sister   . Hypertension Mother   . Macular degeneration Mother   . Kidney disease Mother   . Hypertension Father   . Stroke      uncle  . Prostate cancer      uncle  .  Colon cancer      grandmother/uncle  . Kidney disease      grandmother  . Liver cancer      uncle (mets)  . Heart disease      aunt/uncle  . Lung cancer      uncle  . Stomach cancer Cousin   . Heart disease Cousin   . Colon cancer Paternal Grandmother   . Colon cancer Paternal Uncle      Review of Systems  All other systems reviewed and are negative.      Objective:   Physical Exam  Constitutional: He is oriented to person, place, and time. He appears well-developed and well-nourished. No distress.  HENT:  Head: Normocephalic and atraumatic.  Right Ear: External ear normal.  Left Ear: External ear normal.  Nose: Nose normal.  Mouth/Throat: Oropharynx is clear and moist. No oropharyngeal exudate.  Eyes: Conjunctivae and EOM are normal. Pupils are equal, round, and reactive to light. Right eye exhibits no discharge. Left eye exhibits no discharge. No scleral icterus.  Neck: Normal range of  motion. Neck supple. No JVD present. No tracheal deviation present. No thyromegaly present.  Cardiovascular: Normal rate, regular rhythm, normal heart sounds and intact distal pulses.  Exam reveals no gallop and no friction rub.   No murmur heard. Pulmonary/Chest: Effort normal and breath sounds normal. No stridor. No respiratory distress. He has no wheezes. He has no rales. He exhibits no tenderness.  Abdominal: Soft. Bowel sounds are normal. He exhibits no distension and no mass. There is no tenderness. There is no rebound and no guarding.  Musculoskeletal: Normal range of motion. He exhibits no edema or tenderness.  Lymphadenopathy:    He has no cervical adenopathy.  Neurological: He is alert and oriented to person, place, and time. He has normal reflexes. He displays normal reflexes. No cranial nerve deficit. He exhibits normal muscle tone. Coordination normal.  Skin: Skin is warm. No rash noted. He is not diaphoretic. No erythema. No pallor.  Psychiatric: He has a normal mood and affect. His behavior is normal. Judgment and thought content normal.  Vitals reviewed.         Assessment & Plan:  Routine general medical examination at a health care facility - Plan: CBC with Differential/Platelet, COMPLETE METABOLIC PANEL WITH GFR, Lipid panel, PSA, Medicare  Benign essential HTN - Plan: CBC with Differential/Platelet, COMPLETE METABOLIC PANEL WITH GFR, Lipid panel  Prostate cancer screening - Plan: PSA, Medicare  Acute bronchitis due to other specified organisms - Plan: predniSONE (DELTASONE) 20 MG tablet, albuterol (PROVENTIL HFA;VENTOLIN HFA) 108 (90 BASE) MCG/ACT inhaler  Patient's physical exam is normal. I will check a CBC, CMP, fasting lipid panel, and a PSA. Patient will have his prostate checked at his urologist appointment. We will defer Prevnar 13 today as I'm going to give the patient prednisone for presumed asthmatic bronchitis. I will also give him albuterol 2 puffs inhaled  every 6 hours. I would like to recheck his cough in 2 weeks. Consider screening the patient for whooping cough no better. Also given his acid reflux, I have had the patient stop omeprazole and replace it with dexilant 60 mg poqday.

## 2015-03-23 ENCOUNTER — Telehealth: Payer: Self-pay | Admitting: Urgent Care

## 2015-03-23 ENCOUNTER — Encounter: Payer: Self-pay | Admitting: Family Medicine

## 2015-03-23 LAB — PSA, MEDICARE: PSA: 0.55 ng/mL (ref <=4.00)

## 2015-03-23 NOTE — Telephone Encounter (Signed)
Patient had his CPE yesterday. He is to continue follow up with his PCP. Given his smoking history patient will be due for repeat CT scan in 6 months.

## 2015-03-24 ENCOUNTER — Telehealth: Payer: Self-pay

## 2015-03-24 NOTE — Telephone Encounter (Signed)
CT scan results do not have any notes on it. Please review.

## 2015-03-24 NOTE — Telephone Encounter (Signed)
Pt is calling back to get his ct results

## 2015-03-25 NOTE — Telephone Encounter (Signed)
Please let patient know that his CT showed 2 nodules 66mm in size each in his right upper and lower lung. His PCP has access to this and should continue to monitor the nodules. Dr. Cletus Gash can make the decision as to when to repeat his CT in 6-12 months. Thank You!

## 2015-03-25 NOTE — Telephone Encounter (Signed)
I'll call him. No worries.

## 2015-03-25 NOTE — Telephone Encounter (Signed)
Freida Busman can you call pt. He wants to speak to you directly. I spoke with him to apologize about the situation. He stated he is very unhappy that nobody called him back but when he called I did not see any notes on the report so I did not feel comfortable with telling him. I expressed my apologies.

## 2015-03-25 NOTE — Telephone Encounter (Signed)
Spoke with patient, he was very amiable. Discussed results as below. He is okay with this plan.

## 2015-04-14 ENCOUNTER — Ambulatory Visit (INDEPENDENT_AMBULATORY_CARE_PROVIDER_SITE_OTHER): Payer: Medicare Other | Admitting: Family Medicine

## 2015-04-14 ENCOUNTER — Encounter: Payer: Self-pay | Admitting: Family Medicine

## 2015-04-14 VITALS — BP 130/90 | HR 74 | Temp 98.2°F | Resp 16 | Ht 69.5 in | Wt 217.0 lb

## 2015-04-14 DIAGNOSIS — M7661 Achilles tendinitis, right leg: Secondary | ICD-10-CM

## 2015-04-14 DIAGNOSIS — R053 Chronic cough: Secondary | ICD-10-CM

## 2015-04-14 DIAGNOSIS — R05 Cough: Secondary | ICD-10-CM

## 2015-04-14 LAB — CBC WITH DIFFERENTIAL/PLATELET
BASOS ABS: 0.1 10*3/uL (ref 0.0–0.1)
BASOS PCT: 1 % (ref 0–1)
EOS ABS: 0.2 10*3/uL (ref 0.0–0.7)
EOS PCT: 4 % (ref 0–5)
HEMATOCRIT: 45.3 % (ref 39.0–52.0)
HEMOGLOBIN: 15.9 g/dL (ref 13.0–17.0)
Lymphocytes Relative: 18 % (ref 12–46)
Lymphs Abs: 1 10*3/uL (ref 0.7–4.0)
MCH: 30.2 pg (ref 26.0–34.0)
MCHC: 35.1 g/dL (ref 30.0–36.0)
MCV: 86 fL (ref 78.0–100.0)
MPV: 9.9 fL (ref 8.6–12.4)
Monocytes Absolute: 0.6 10*3/uL (ref 0.1–1.0)
Monocytes Relative: 10 % (ref 3–12)
Neutro Abs: 3.9 10*3/uL (ref 1.7–7.7)
Neutrophils Relative %: 67 % (ref 43–77)
PLATELETS: 242 10*3/uL (ref 150–400)
RBC: 5.27 MIL/uL (ref 4.22–5.81)
RDW: 13.7 % (ref 11.5–15.5)
WBC: 5.8 10*3/uL (ref 4.0–10.5)

## 2015-04-14 MED ORDER — TRAMADOL HCL 50 MG PO TABS: 50.0000 mg | ORAL_TABLET | Freq: Four times a day (QID) | ORAL | Status: DC | PRN

## 2015-04-14 NOTE — Progress Notes (Signed)
Subjective:    Patient ID: Robert Page, male    DOB: [birth year], 69 y.o.   MRN: [id number]  HPI 03/22/15 Here for CPE.  Last colonoscopy 2014, significant for polyps, next colonoscopy is due in 2019.  Patient has had pneumovax in 2014.  Patient did not receive Prevnar 13. He is due for that this year. However the patient reports a cough that has been present for 6 months. Recently it has gotten worse. He went to an urgent care and was given Z-Pak. An x-ray was obtained there which was normal. Patient had a CT scan of the chest which revealed 25 mm pulmonary nodules in the right loading but no evidence of pneumonia. Given his smoking history he will be due for repeat CT scan of the chest in 6 months. However there is no explanation for this  chronic cough. 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 He does report some wheezing with the cough. The cough comes in spells. He denies any exposure to TB. He is unsure of any exposure to whooping cough. He does report severe daily acid reflux. He denies any hemoptysis.  At that time, my plan was: Patient's physical exam is normal. I will check a CBC, CMP, fasting lipid panel, and a PSA. Patient will have his prostate checked at his urologist appointment. We will defer Prevnar 13 today as I'm going to give the patient prednisone for presumed asthmatic bronchitis. I will also give him albuterol 2 puffs inhaled every 6 hours. I would like to recheck his cough in 2 weeks. Consider screening the patient for whooping cough no better. Also given his acid reflux, I have had the patient stop omeprazole and replace it with dexilant 60 mg poqday.  04/14/15 Patient states that his cough is no better. He did try switching to dexilant without any benefit. He reports 1-2 episodes of breakthrough acid reflux per week. He also took prednisone as well as albuterol for possible asthmatic bronchitis given his exposure [place]  but without benefit.  He is concerned and he may be inhaling some type of mold spores due to his work as a Psychologist, sport and exercise. He denies any fevers or chills however he does have frequent severe coughing spells. He states that he will be fine 1 minute and the next minute he will be coughing uncontrollably. He denies any hemoptysis. He also has reports 3 weeks of pain in his right ankle. This occurred after he twisted his ankle on a rock in the field. He reports chronic issues with the same ankle ever since the 70s when he sprained his ankle severely. However today on examination he has pain with plantar flexion.  He has tenderness to palpation along the Achilles tendon. He has negative Thompson's test. He still has full range of motion in the ankle. There is no tenderness to palpation over the medial malleolus, the lateral malleolus, as the navicular bone, with the fifth metatarsal. There is no effusion, swelling, or ecchymosis Past Medical History  Diagnosis Date  . Allergy   . Hypertension   . Diverticulosis   . Dupuytren's disease     palms and soles  . Adenomatous polyp   . GERD (gastroesophageal reflux disease)   . Atrial tachycardia    Past Surgical History  Procedure Laterality Date  . Lasik  2002    Bil  . Colectomy  2010    for diverticulitis  .  Back surgery      12/2013   Current Outpatient Prescriptions on File Prior to Visit  Medication Sig Dispense Refill  . albuterol (PROVENTIL HFA;VENTOLIN HFA) 108 (90 BASE) MCG/ACT inhaler Inhale 2 puffs into the lungs every 6 (six) hours as needed for wheezing or shortness of breath. 1 Inhaler 0  . fexofenadine (ALLEGRA) 180 MG tablet 180 mg daily.     Marland Kitchen HYDROcodone-homatropine (HYCODAN) 5-1.5 MG/5ML syrup Take 5 mLs by mouth at bedtime as needed. 120 mL 0  . losartan (COZAAR) 100 MG tablet Take 1 tablet (100 mg total) by mouth daily. 90 tablet 3  . olmesartan (BENICAR) 20 MG tablet Take 1 tablet (20 mg total) by mouth daily. 30 tablet 3  . omeprazole  (PRILOSEC) 10 MG capsule Take 10 mg by mouth daily.     No current facility-administered medications on file prior to visit.   Allergies  Allergen Reactions  . Ibuprofen     REACTION: stomach cramps  . Sulfonamide Derivatives     REACTION: itch   Social History   Social History  . Marital Status: Married    Spouse Name: Joanthony Hamza  . Number of Children: N/A  . Years of Education: N/A   Occupational History  . Not on file.   Social History Main Topics  . Smoking status: Light Tobacco Smoker    Types: Cigars  . Smokeless tobacco: Never Used     Comment: Pt states smokes occasional cigar.  This is typically < 1 time per month  . Alcohol Use: No  . Drug Use: No  . Sexual Activity: Not on file   Other Topics Concern  . Not on file   Social History Narrative   Pt lives in [place].  Retired from [employer] and [employer].  Goes on mission trips to [place], gets regular exercise.   Family History  Problem Relation Age of Onset  . Melanoma Sister   . Hypertension Mother   . Macular degeneration Mother   . Kidney disease Mother   . Hypertension Father   . Stroke      uncle  . Prostate cancer      uncle  . Colon cancer      grandmother/uncle  . Kidney disease      grandmother  . Liver cancer      uncle (mets)  . Heart disease      aunt/uncle  . Lung cancer      uncle  . Stomach cancer Cousin   . Heart disease Cousin   . Colon cancer Paternal Grandmother   . Colon cancer Paternal Uncle      Review of Systems  All other systems reviewed and are negative.      Objective:   Physical Exam  Constitutional: He is oriented to person, place, and time. He appears well-developed and well-nourished. No distress.  HENT:  Head: Normocephalic and atraumatic.  Right Ear: External ear normal.  Left Ear: External ear normal.  Nose: Nose normal.  Mouth/Throat: Oropharynx is clear and moist. No oropharyngeal exudate.  Eyes: Conjunctivae and EOM are normal. Pupils are  equal, round, and reactive to light. Right eye exhibits no discharge. Left eye exhibits no discharge. No scleral icterus.  Neck: Normal range of motion. Neck supple. No JVD present. No tracheal deviation present. No thyromegaly present.  Cardiovascular: Normal rate, regular rhythm, normal heart sounds and intact distal pulses.  Exam reveals no gallop and no friction rub.   No murmur heard. Pulmonary/Chest:  Effort normal and breath sounds normal. No stridor. No respiratory distress. He has no wheezes. He has no rales. He exhibits no tenderness.  Abdominal: Soft. Bowel sounds are normal. He exhibits no distension and no mass. There is no tenderness. There is no rebound and no guarding.  Musculoskeletal: Normal range of motion. He exhibits no edema or tenderness.  Lymphadenopathy:    He has no cervical adenopathy.  Neurological: He is alert and oriented to person, place, and time. He has normal reflexes. No cranial nerve deficit. He exhibits normal muscle tone. Coordination normal.  Skin: Skin is warm. No rash noted. He is not diaphoretic. No erythema. No pallor.  Psychiatric: He has a normal mood and affect. His behavior is normal. Judgment and thought content normal.  Vitals reviewed.         Assessment & Plan:  Chronic coughing - Plan: CBC with Differential/Platelet, IGE, Bordetella pertussis Ab IgG, IgA, traMADol (ULTRAM) 50 MG tablet, Angiotensin converting enzyme  Tendonitis, Achilles, right  I believe the patient has a severe case of Achilles tendinitis. I recommended that he use a Cam Walker and be weightbearing as tolerated for the next 2 weeks and then recheck. I believe his chronic coughing is likely due to an element of allergies and also uncontrolled laryngo-esophageal reflux. I will check antibodies to evaluate for possible pertussis exposure, I will check an IgE level as well as a CBC with differential to evaluate for eosinophilia to see if there is possible fungal spore  pneumonitis such as aspergillosis. I will also check alevel to evaluate for possible signs of sarcoidosis and I'll refer the patient to a pulmonologist. Meanwhile start him on tramadol 50 mg every 6 hours as needed for cough in case this is nerve irritation in the airways

## 2015-04-15 LAB — ANGIOTENSIN CONVERTING ENZYME: Angiotensin-Converting Enzyme: 25 U/L (ref 8–52)

## 2015-04-15 LAB — IGE: IGE (IMMUNOGLOBULIN E), SERUM: 97 kU/L (ref <115)

## 2015-04-19 ENCOUNTER — Other Ambulatory Visit: Payer: Medicare Other

## 2015-04-19 ENCOUNTER — Encounter: Payer: Self-pay | Admitting: Family Medicine

## 2015-04-19 ENCOUNTER — Ambulatory Visit (INDEPENDENT_AMBULATORY_CARE_PROVIDER_SITE_OTHER): Payer: Medicare Other | Admitting: Internal Medicine

## 2015-04-19 ENCOUNTER — Encounter: Payer: Self-pay | Admitting: Internal Medicine

## 2015-04-19 VITALS — BP 158/92 | HR 62 | Ht 69.5 in | Wt 220.8 lb

## 2015-04-19 DIAGNOSIS — R059 Cough, unspecified: Secondary | ICD-10-CM

## 2015-04-19 DIAGNOSIS — R05 Cough: Secondary | ICD-10-CM

## 2015-04-19 DIAGNOSIS — F17210 Nicotine dependence, cigarettes, uncomplicated: Secondary | ICD-10-CM

## 2015-04-19 DIAGNOSIS — Z720 Tobacco use: Secondary | ICD-10-CM | POA: Diagnosis not present

## 2015-04-19 DIAGNOSIS — I10 Essential (primary) hypertension: Secondary | ICD-10-CM | POA: Diagnosis not present

## 2015-04-19 DIAGNOSIS — R918 Other nonspecific abnormal finding of lung field: Secondary | ICD-10-CM | POA: Diagnosis not present

## 2015-04-19 LAB — BORDETELLA PERTUSSIS AB IGG,IGA
FHA IGA: 4 [IU]/mL
FHA IgG: 17 IU/mL
PT IgA: 7 IU/mL
PT IgG: 26 IU/mL

## 2015-04-19 MED ORDER — PANTOPRAZOLE SODIUM 40 MG PO TBEC: 40.0000 mg | DELAYED_RELEASE_TABLET | Freq: Every day | ORAL | Status: DC

## 2015-04-19 NOTE — Assessment & Plan Note (Signed)
The most common causes of chronic cough in immunocompetent adults include the following: upper airway cough syndrome (UACS), previously referred to as postnasal drip syndrome (PNDS), which is caused by variety of rhinosinus conditions; (2) asthma; (3) GERD; (4) chronic bronchitis from cigarette smoking or other inhaled environmental irritants; (5) nonasthmatic eosinophilic bronchitis; and (6) bronchiectasis.   These conditions, singly or in combination, have accounted for up to 94% of the causes of chronic cough in prospective studies.   Other conditions have constituted no >6% of the causes in prospective studies These have included bronchogenic carcinoma, chronic interstitial pneumonia, sarcoidosis, left ventricular failure, ACEI-induced cough, and aspiration from a condition associated with pharyngeal dysfunction.    Chronic cough is often simultaneously caused by more than one condition. A single cause has been found from 38 to 82% of the time, multiple causes from 18 to 62%. Multiply caused cough has been the result of three diseases up to 42% of the time.       Based on hx and exam, this is most likely:  Classic Upper airway cough syndrome, so named because it's frequently impossible to sort out how much is  CR/sinusitis with freq throat clearing (which can be related to primary GERD)   vs  causing  secondary (" extra esophageal")  GERD from wide swings in gastric pressure that occur with throat clearing, often  promoting self use of mint and menthol lozenges that reduce the lower esophageal sphincter tone and exacerbate the problem further in a cyclical fashion.   These are the same pts (now being labeled as having "irritable larynx syndrome" by some cough centers) who not infrequently have a history of having failed to tolerate ace inhibitors,  dry powder inhalers or biphosphonates or report having atypical reflux symptoms that don't respond to standard doses of PPI , and are easily confused as  having aecopd or asthma flares by even experienced allergists/ pulmonologists.   The first step is to maximize acid suppression/gerd diet  and eliminate cyclical coughing then regroup if the cough persists.  I had an extended discussion with the patient reviewing all relevant studies completed to date and  lasting 35 min  1) Explained: The standardized cough guidelines published in Chest by Lissa Morales in 2006 are still the best available and consist of a multiple step process (up to 12!) , not a single office visit,  and are intended  to address this problem logically,  with an alogrithm dependent on response to empiric treatment at  each progressive step  to determine a specific diagnosis with  minimal addtional testing needed. Therefore if adherence is an issue or can't be accurately verified,  it's very unlikely the standard evaluation and treatment will be successful here.    Furthermore, response to therapy (other than acute cough suppression, which should only be used short term with avoidance of narcotic containing cough syrups if possible), can be a gradual process for which the patient may perceive immediate benefit.  Unlike going to an eye doctor where the best perscription is almost always the first one and is immediately effective, this is almost never the case in the management of chronic cough syndromes. Therefore the patient needs to commit up front to consistently adhere to recommendations  for up to 6 weeks of therapy directed at the likely underlying problem(s) before the response can be reasonably evaluated.     2) Each maintenance medication was reviewed in detail including most importantly the difference between maintenance and prns and  under what circumstances the prns are to be triggered using an action plan format that is not reflected in the computer generated alphabetically organized AVS.    Please see instructions for details which were reviewed in writing and the  patient given a copy highlighting the part that I personally wrote and discussed at today's ov.   See instructions for specific recommendations which were reviewed directly with the patient who was given a copy with highlighter outlining the key components.

## 2015-04-19 NOTE — Progress Notes (Addendum)
Subjective:    Patient ID: Robert Page, male    DOB: 29-May-1946,   MRN: [id number]  HPI  37 yowm quit smoking cigars x 2015 "always had hasal allergies"  Fall > spring on allegra daily with new onset cough 2015 comes and goes s pattern so referred to pulmonary clinic 04/19/2015 by DR Margaretmary Eddy   04/19/2015 [facility] office visit/ Robert Page   Chief Complaint  Patient presents with  . Pulmonary Consult    Pt referred by Dr. [name] for chronic cough. Pt c/o chronic dry cough x 6 months. Pt also c/o lack in energy, DOE. Pt denies CP/tightness.   cough is sporadic x one year p indolent onset/ can be gone for a week/ only sob when coughing/ better with mints transiently/ more predominant daytime than noct  Did not resp so far to dexilant or pred or albuterol     Kouffman Reflux v Neurogenic Cough Differentiator Reflux Comments  Do you awaken from a sound sleep coughing violently?                            With trouble breathing? no   Do you have choking episodes when you cannot  Get enough air, gasping for air ?              no   Do you usually cough when you lie down into  The bed, or when you just lie down to rest ?                          no   Do you usually cough after meals or eating?         no   Do you cough when (or after) you bend over?    no   GERD SCORE     Kouffman Reflux v Neurogenic Cough Differentiator Neurogenic   Do you more-or-less cough all day long? no   Does change of temperature make you cough? yes   Does laughing or chuckling cause you to cough? no   Do fumes (perfume, automobile fumes, burned  Toast, etc.,) cause you to cough ?      no   Does speaking, singing, or talking on the phone cause you to cough   ?               no   Neurogenic/Airway score          Review of Systems  Constitutional: Positive for fatigue. Negative for fever and unexpected weight change.  HENT: Negative for congestion, dental problem, ear pain, nosebleeds,  postnasal drip, rhinorrhea, sinus pressure, sneezing, sore throat and trouble swallowing.   Eyes: Negative for redness and itching.  Respiratory: Positive for cough and shortness of breath. Negative for chest tightness and wheezing.   Cardiovascular: Negative for palpitations and leg swelling.  Gastrointestinal: Negative for nausea and vomiting.  Genitourinary: Negative for dysuria.  Musculoskeletal: Negative for joint swelling.  Skin: Negative for rash.  Neurological: Negative for headaches.  Hematological: Does not bruise/bleed easily.  Psychiatric/Behavioral: Negative for dysphoric mood. The patient is not nervous/anxious.        Objective:   Physical Exam   amb wm nad  Wt Readings from Last 3 Encounters:  04/19/15 220 lb 12.8 oz (100.154 kg)  04/14/15 217 lb (98.431 kg)  03/22/15 216 lb (97.977 kg)    Vital signs reviewed  HEENT: nl dentition, turbinates, and orophanx. Nl external ear canals without cough reflex   NECK :  without JVD/Nodes/TM/ nl carotid upstrokes bilaterally   LUNGS: no acc muscle use, clear to A and P bilaterally without cough on insp or exp maneuvers   CV:  RRR  no s3 or murmur or increase in P2, no edema   ABD:  soft and nontender with nl excursion in the supine position. No bruits or organomegaly, bowel sounds nl  MS:  warm without deformities, calf tenderness, cyanosis or clubbing  SKIN: warm and dry without lesions    NEURO:  alert, approp, no deficits    Labs ordered: cbc with diff/ RAST profile  CT chest s contrast 03/19/15 5 mm right lower lobe and right upper lobe pulmonary nodules     Assessment & Plan:

## 2015-04-19 NOTE — Patient Instructions (Addendum)
Pantoprazole (protonix) 40 mg   Take  30-60 min before first meal of the day and Pepcid ac (over the counter = famotidine)  20 mg one @  bedtime until return to office - this is the best way to tell whether stomach acid is contributing to your problem.    GERD (REFLUX)  is an extremely common cause of respiratory symptoms just like yours , many times with no obvious heartburn at all.    It can be treated with medication, but also with lifestyle changes including elevation of the head of your bed (ideally with 6 inch  bed blocks),  Smoking cessation, avoidance of late meals, excessive alcohol, and avoid fatty foods, chocolate, peppermint, colas, red wine, and acidic juices such as orange juice.  NO MINT OR MENTHOL PRODUCTS SO NO COUGH DROPS  USE SUGARLESS CANDY INSTEAD (Jolley ranchers or Stover's or Life Savers) or even ice chips will also do - the key is to swallow to prevent all throat clearing. NO OIL BASED VITAMINS - use powdered substitutes.   Take delsym two tsp every 12 hours and supplement if needed with  tramadol 50 mg up to 1 every 4 hours to suppress the urge to cough. Swallowing water or using ice chips/non mint and menthol containing candies (such as lifesavers or sugarless jolly ranchers) are also effective.  You should rest your voice and avoid activities that you know make you cough.  Once you have eliminated the cough for 3 straight days try reducing the tramadol first,  then the delsym as tolerated.  Prednisone x 2 pills until you only have 3 left then take one daily x 3 days and stop   Please remember to go to the lab department downstairs for your tests - we will call you with the results when they are available.  Please schedule a follow up office visit in 2  weeks, sooner if needed

## 2015-04-20 ENCOUNTER — Encounter: Payer: Self-pay | Admitting: Internal Medicine

## 2015-04-20 ENCOUNTER — Encounter: Payer: Self-pay | Admitting: Family Medicine

## 2015-04-20 DIAGNOSIS — F17210 Nicotine dependence, cigarettes, uncomplicated: Secondary | ICD-10-CM | POA: Insufficient documentation

## 2015-04-20 LAB — ALLERGY FULL PROFILE
Allergen, D pternoyssinus,d7: <0.1 kU/L
Allergen,Goose feathers, e70: <0.1 kU/L
Alternaria Alternata: <0.1 kU/L
Aspergillus fumigatus, m3: <0.1 kU/L
BOX ELDER: 1.29 kU/L — AB
Bahia Grass: <0.1 kU/L
Bermuda Grass: <0.1 kU/L
Common Ragweed: <0.1 kU/L
Curvularia lunata: <0.1 kU/L
D. farinae: <0.1 kU/L
Elm IgE: <0.1 kU/L
Fescue: <0.1 kU/L
G005 Rye, Perennial: <0.1 kU/L
G009 Red Top: <0.1 kU/L
Helminthosporium halodes: <0.1 kU/L
House Dust Hollister: <0.1 kU/L
IgE (Immunoglobulin E), Serum: 90 kU/L (ref <115)
Lamb's Quarters: <0.1 kU/L
Oak: <0.1 kU/L
Sycamore Tree: <0.1 kU/L
Timothy Grass: <0.1 kU/L

## 2015-04-20 NOTE — Assessment & Plan Note (Addendum)
Not Adequate control on present rx, reviewed > no change in rx needed  For now rec avoid salt and recheck with primary in 2 weeks  He tells me his cough was just as bad on benicar. Note: For reasons that may related to vascular permability and nitric oxide pathways but not elevated  bradykinin levels (as seen with  ACEi use) losartan in the generic form has been reported now from mulitple sources  to cause a similar pattern of non-specific  upper airway symptoms as seen with acei.   This has not been reported with exposure to the other ARB's to date, so it may be necessary to use an alternative class altogether eg bystolic trial x 4 weeks bur for now I don't rec change rx  See:  Ann Allergy Asthma Immunol  2008: 101: p [phone]

## 2015-04-20 NOTE — Assessment & Plan Note (Signed)

## 2015-04-20 NOTE — Assessment & Plan Note (Signed)
CT results reviewed with pt >>> Too small for PET or bx, not suspicious enough for excisional bx > really only option for now is follow the Fleischner society guidelines as rec by radiology> = 1 year f/u at a minimum though 6 m is reasonable since "light smoker"   Appears that he already has this addressed so we'll just place in our tickle file to be sure it's been done

## 2015-04-30 ENCOUNTER — Ambulatory Visit (INDEPENDENT_AMBULATORY_CARE_PROVIDER_SITE_OTHER): Payer: Medicare Other | Admitting: Internal Medicine

## 2015-04-30 ENCOUNTER — Encounter: Payer: Self-pay | Admitting: Internal Medicine

## 2015-04-30 VITALS — BP 116/80 | HR 79 | Ht 67.5 in | Wt 213.0 lb

## 2015-04-30 DIAGNOSIS — R05 Cough: Secondary | ICD-10-CM

## 2015-04-30 DIAGNOSIS — R059 Cough, unspecified: Secondary | ICD-10-CM

## 2015-04-30 NOTE — Patient Instructions (Signed)
For drainage ok try  chlortrimeton (chlorpheniramine) 4 mg every 4 hours available over the counter (may cause drowsiness) instead of allegra   Once we have the first frost try taper off the acid suppression and see if cough flares    If you are satisfied with your treatment plan,  let your doctor know and he/she can either refill your medications or you can return here when your prescription runs out.     If in any way you are not 100% satisfied,  please tell us.  If 100% better, tell your friends!  Pulmonary follow up is as needed

## 2015-04-30 NOTE — Progress Notes (Signed)
Subjective:    Patient ID: Robert Page, male    DOB: 07-13-1946,   MRN: [id number]     Brief patient profile:  69 yowm quit smoking cigars x 2015 "always had hasal allergies" = itching> sneezing but not coughing  Fall > spring on allegra daily with new onset cough 2015 comes and goes s pattern so referred to pulmonary clinic 04/19/2015 by DR Margaretmary Eddy    History of Present Illness  04/19/2015 1st [facility] office visit/ [facility]   Chief Complaint  Patient presents with  . Pulmonary Consult    Pt referred by Dr. [name] for chronic cough. Pt c/o chronic dry cough x 6 months. Pt also c/o lack in energy, DOE. Pt denies CP/tightness.   cough is never productive/ sporadic x one year p indolent onset/ can be gone for a week/ only sob when coughing/ better with mints transiently/ more predominant daytime than noct  Did not resp so far to dexilant or pred or albuterol     Kouffman Reflux v Neurogenic Cough Differentiator Reflux Comments  Do you awaken from a sound sleep coughing violently?                            With trouble breathing? no   Do you have choking episodes when you cannot  Get enough air, gasping for air ?              no   Do you usually cough when you lie down into  The bed, or when you just lie down to rest ?                          no   Do you usually cough after meals or eating?         no   Do you cough when (or after) you bend over?    no   GERD SCORE     Kouffman Reflux v Neurogenic Cough Differentiator Neurogenic   Do you more-or-less cough all day long? no   Does change of temperature make you cough? yes   Does laughing or chuckling cause you to cough? no   Do fumes (perfume, automobile fumes, burned  Toast, etc.,) cause you to cough ?      no   Does speaking, singing, or talking on the phone cause you to cough   ?               no   Neurogenic/Airway score     rec Pantoprazole (protonix) 40 mg   Take  30-60 min before first meal of the day and  Pepcid ac (over the counter = famotidine)  20 mg one @  bedtime  GERD diet  Take delsym two tsp every 12 hours and supplement if needed with  tramadol 50 mg up to 1 every 4 hours   Once you have eliminated the cough for 3 straight days try reducing the tramadol first,  then the delsym as tolerated. Prednisone x 2 pills until you only have 3 left then take one daily x 3 days and stop   04/30/2015 f/u ov/Wert re: new onset cough 2015 Chief Complaint  Patient presents with  . Follow-up    Pt states that his cough has improved some, but has not completely resolved.   cough remains dry/ day > noct but much better / still some sense of  pnds not  Better with allegra   No obvious day to day or daytime variability or assoc sob or cp or chest tightness, subjective wheeze or overt sinus or hb symptoms. No unusual exp hx or h/o childhood pna/ asthma or knowledge of premature birth.  Sleeping ok without nocturnal  or early am exacerbation  of respiratory  c/o's or need for noct saba. Also denies any obvious fluctuation of symptoms with weather or environmental changes or other aggravating or alleviating factors except as outlined above   Current Medications, Allergies, Complete Past Medical History, Past Surgical History, Family History, and Social History were reviewed in [employer] record.  ROS  The following are not active complaints unless bolded sore throat, dysphagia, dental problems, itching, sneezing,  nasal congestion or excess/ purulent secretions, ear ache,   fever, chills, sweats, unintended wt loss, classically pleuritic or exertional cp, hemoptysis,  orthopnea pnd or leg swelling, presyncope, palpitations, abdominal pain, anorexia, nausea, vomiting, diarrhea  or change in bowel or bladder habits, change in stools or urine, dysuria,hematuria,  rash, arthralgias, visual complaints, headache, numbness, weakness or ataxia or problems with walking or coordination,  change in  mood/affect or memory.            Objective:   Physical Exam   amb wm nad  04/30/2015       213 Wt Readings from Last 3 Encounters:  04/19/15 220 lb 12.8 oz (100.154 kg)  04/14/15 217 lb (98.431 kg)  03/22/15 216 lb (97.977 kg)    Vital signs reviewed    HEENT: nl dentition, turbinates, and orophanx. Nl external ear canals without cough reflex   NECK :  without JVD/Nodes/TM/ nl carotid upstrokes bilaterally   LUNGS: no acc muscle use, clear to A and P bilaterally without cough on insp or exp maneuvers   CV:  RRR  no s3 or murmur or increase in P2, no edema   ABD:  soft and nontender with nl excursion in the supine position. No bruits or organomegaly, bowel sounds nl  MS:  warm without deformities, calf tenderness, cyanosis or clubbing  SKIN: warm and dry without lesions    NEURO:  alert, approp, no deficits         CT chest s contrast 03/19/15 5 mm right lower lobe and right upper lobe pulmonary nodules     Assessment & Plan:

## 2015-05-01 NOTE — Assessment & Plan Note (Addendum)
Allergy profile  04/19/15 IgE 90 with POS RAST Box elder/  Eos 0.2   Note his cough does not have a pattern that would suggest a seasonal component  so I doubt the Pos RAST explains any of his coughing.  I still strongly favor upper airway cough syndrome. He has clearly improved with the regimen that was recommended on the last visit and all I would add now is that he change to first generation antihistamines if he can tolerate the drowsiness effects. If not, Zyrtec may be worth considering.   I had an extended summary final  discussion with the patient reviewing all relevant studies completed to date and  lasting 15 to 20 minutes of a 25 minute visit    Ok to taper off gerd rx p 3 months to establish benefit then taper off.  Each maintenance medication was reviewed in detail including most importantly the difference between maintenance and prns and under what circumstances the prns are to be triggered using an action plan format that is not reflected in the computer generated alphabetically organized AVS.    Please see instructions for details which were reviewed in writing and the patient given a copy highlighting the part that I personally wrote and discussed at today's ov.

## 2015-05-17 ENCOUNTER — Ambulatory Visit (INDEPENDENT_AMBULATORY_CARE_PROVIDER_SITE_OTHER): Payer: Medicare Other | Admitting: Family Medicine

## 2015-05-17 ENCOUNTER — Encounter: Payer: Self-pay | Admitting: Family Medicine

## 2015-05-17 VITALS — BP 130/72 | HR 68 | Temp 98.2°F | Resp 18 | Ht 69.5 in | Wt 218.0 lb

## 2015-05-17 DIAGNOSIS — M7661 Achilles tendinitis, right leg: Secondary | ICD-10-CM

## 2015-05-17 DIAGNOSIS — R053 Chronic cough: Secondary | ICD-10-CM

## 2015-05-17 DIAGNOSIS — R05 Cough: Secondary | ICD-10-CM

## 2015-05-17 DIAGNOSIS — Z23 Encounter for immunization: Secondary | ICD-10-CM | POA: Diagnosis not present

## 2015-05-17 MED ORDER — TRAMADOL HCL 50 MG PO TABS: 50.0000 mg | ORAL_TABLET | Freq: Three times a day (TID) | ORAL | Status: DC | PRN

## 2015-05-17 NOTE — Addendum Note (Signed)
Addended by: Shary Decamp B on: 05/17/2015 11:03 AM   Modules accepted: Orders

## 2015-05-17 NOTE — Progress Notes (Signed)
Subjective:    Patient ID: Robert Page, male    DOB: 1946-08-25, 69 y.o.   MRN: [id number]  HPI 03/22/15 Here for CPE.  Last colonoscopy 2014, significant for polyps, next colonoscopy is due in 2019.  Patient has had pneumovax in 2014.  Patient did not receive Prevnar 13. He is due for that this year. However the patient reports a cough that has been present for 6 months. Recently it has gotten worse. He went to an urgent care and was given Z-Pak. An x-ray was obtained there which was normal. Patient had a CT scan of the chest which revealed 25 mm pulmonary nodules in the right loading but no evidence of pneumonia. Given his smoking history he will be due for repeat CT scan of the chest in 6 months. However there is no explanation for this  chronic cough. 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 He does report some wheezing with the cough. The cough comes in spells. He denies any exposure to TB. He is unsure of any exposure to whooping cough. He does report severe daily acid reflux. He denies any hemoptysis.  At that time, my plan was: Patient's physical exam is normal. I will check a CBC, CMP, fasting lipid panel, and a PSA. Patient will have his prostate checked at his urologist appointment. We will defer Prevnar 13 today as I'm going to give the patient prednisone for presumed asthmatic bronchitis. I will also give him albuterol 2 puffs inhaled every 6 hours. I would like to recheck his cough in 2 weeks. Consider screening the patient for whooping cough no better. Also given his acid reflux, I have had the patient stop omeprazole and replace it with dexilant 60 mg poqday.  04/14/15 Patient states that his cough is no better. He did try switching to dexilant without any benefit. He reports 1-2 episodes of breakthrough acid reflux per week. He also took prednisone as well as albuterol for possible asthmatic bronchitis given his exposure [place]  but without benefit.  He is concerned and he may be inhaling some type of mold spores due to his work as a Psychologist, sport and exercise. He denies any fevers or chills however he does have frequent severe coughing spells. He states that he will be fine 1 minute and the next minute he will be coughing uncontrollably. He denies any hemoptysis. He also has reports 3 weeks of pain in his right ankle. This occurred after he twisted his ankle on a rock in the field. He reports chronic issues with the same ankle ever since the 70s when he sprained his ankle severely. However today on examination he has pain with plantar flexion.  He has tenderness to palpation along the Achilles tendon. He has negative Thompson's test. He still has full range of motion in the ankle. There is no tenderness to palpation over the medial malleolus, the lateral malleolus, as the navicular bone, with the fifth metatarsal. There is no effusion, swelling, or ecchymosis.  At that time, my plan was: I believe the patient has a severe case of Achilles tendinitis. I recommended that he use a Cam Walker and be weightbearing as tolerated for the next 2 weeks and then recheck. I believe his chronic coughing is likely due to an element of allergies and also uncontrolled laryngo-esophageal reflux. I will check antibodies to evaluate for possible pertussis exposure, I will check an IgE level as well as  a CBC with differential to evaluate for eosinophilia to see if there is possible fungal spore pneumonitis such as aspergillosis. I will also check alevel to evaluate for possible signs of sarcoidosis and I'll refer the patient to a pulmonologist. Meanwhile start him on tramadol 50 mg every 6 hours as needed for cough in case this is nerve irritation in the airways.  05/17/15 Here for follow up.  Thankfully, since seen Dr. Melvyn Novas, patient's cough is a proximally 90% better. He still needs tramadol prior to going to church to prevent the coughing spasms. His biggest concern is that  the medication interferes with his bowels. He is trying to wean himself away from that cough medicine. However he is doing much better than he was previously. He is taking a proton pump inhibitor plus Pepcid to help prevent the cough. He has been diagnosed with upper airway cough syndrome triggered by acid reflux. Regarding his Achilles tendinitis, the pain in his right Achilles tendon is approximately 90% better as well. He is wean himself away from the [facility] and is now using am ASO.   Past Medical History  Diagnosis Date  . Allergy   . Hypertension   . Diverticulosis   . Dupuytren's disease     palms and soles  . Adenomatous polyp   . GERD (gastroesophageal reflux disease)   . Atrial tachycardia    Past Surgical History  Procedure Laterality Date  . Lasik  2002    Bil  . Colectomy  2010    for diverticulitis  . Back surgery      12/2013   Current Outpatient Prescriptions on File Prior to Visit  Medication Sig Dispense Refill  . albuterol (PROVENTIL HFA;VENTOLIN HFA) 108 (90 BASE) MCG/ACT inhaler Inhale 2 puffs into the lungs every 6 (six) hours as needed for wheezing or shortness of breath. 1 Inhaler 0  . famotidine (PEPCID) 20 MG tablet Take 20 mg by mouth at bedtime.    . fexofenadine (ALLEGRA) 180 MG tablet 180 mg daily.     Marland Kitchen HYDROcodone-homatropine (HYCODAN) 5-1.5 MG/5ML syrup Take 5 mLs by mouth at bedtime as needed. 120 mL 0  . losartan (COZAAR) 100 MG tablet Take 1 tablet (100 mg total) by mouth daily. 90 tablet 3  . pantoprazole (PROTONIX) 40 MG tablet Take 1 tablet (40 mg total) by mouth daily. Take 30-60 min before first meal of the day 30 tablet 2   No current facility-administered medications on file prior to visit.   Allergies  Allergen Reactions  . Ibuprofen     REACTION: stomach cramps  . Sulfonamide Derivatives     REACTION: itch   Social History   Social History  . Marital Status: Married    Spouse Name: Ziare Cryder  . Number of Children: N/A    . Years of Education: N/A   Occupational History  . retired    Social History Main Topics  . Smoking status: Light Tobacco Smoker    Types: Cigars  . Smokeless tobacco: Never Used     Comment: Pt states smokes occasional cigar.  This is typically < 1 time per month  . Alcohol Use: No  . Drug Use: No  . Sexual Activity: Not on file   Other Topics Concern  . Not on file   Social History Narrative   Pt lives in [place].  Retired from [employer] and [employer].  Goes on mission trips to [place], gets regular exercise.   Family History  Problem Relation  Age of Onset  . Melanoma Sister   . Hypertension Mother   . Macular degeneration Mother   . Kidney disease Mother   . Hypertension Father   . Stroke      uncle  . Prostate cancer      uncle  . Colon cancer      grandmother/uncle  . Kidney disease      grandmother  . Liver cancer      uncle (mets)  . Heart disease      aunt/uncle  . Lung cancer      uncle  . Stomach cancer Cousin   . Heart disease Cousin   . Colon cancer Paternal Grandmother   . Colon cancer Paternal Uncle      Review of Systems  All other systems reviewed and are negative.      Objective:   Physical Exam  Constitutional: He appears well-developed and well-nourished. No distress.  Neck: Normal range of motion. Neck supple. No JVD present. No thyromegaly present.  Cardiovascular: Normal rate, regular rhythm, normal heart sounds and intact distal pulses.  Exam reveals no gallop and no friction rub.   No murmur heard. Pulmonary/Chest: Effort normal and breath sounds normal. No stridor. No respiratory distress. He has no wheezes. He has no rales. He exhibits no tenderness.  Abdominal: Soft. Bowel sounds are normal. He exhibits no distension and no mass. There is no tenderness. There is no rebound and no guarding.  Lymphadenopathy:    He has no cervical adenopathy.  Skin: Skin is warm. No rash noted. He is not diaphoretic. No erythema.  Vitals  reviewed.         Assessment & Plan:  Chronic coughing - Plan: traMADol (ULTRAM) 50 MG tablet  Tendonitis, Achilles, right  Overall the patient is doing much better. I appreciate Dr. Marlene Lard help. Continue Pepcid and the proton pump inhibitor. Use tramadol as needed for cough. Use MiraLAX on days that he takes tramadol. Try to wean himself away from the tramadol as much as possible. Achilles tendinitis is almost resolved. 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 However discontinued ankle brace when he is performing mild activities to prevent restrictions in his range of motion and flexibility. I did give the patient some samples of Levitra 20 mg at his request for a trial to see if this will help with erectile dysfunction

## 2015-07-28 ENCOUNTER — Ambulatory Visit (INDEPENDENT_AMBULATORY_CARE_PROVIDER_SITE_OTHER): Payer: Medicare Other | Admitting: Family Medicine

## 2015-07-28 ENCOUNTER — Encounter: Payer: Self-pay | Admitting: Family Medicine

## 2015-07-28 VITALS — BP 150/72 | HR 86 | Temp 98.0°F | Resp 16 | Ht 69.5 in | Wt 216.0 lb

## 2015-07-28 DIAGNOSIS — L929 Granulomatous disorder of the skin and subcutaneous tissue, unspecified: Secondary | ICD-10-CM | POA: Diagnosis not present

## 2015-07-28 NOTE — Progress Notes (Signed)
Subjective:    Patient ID: Robert Page, male    DOB: 25-Mar-1946, 69 y.o.   MRN: [id number]  HPI 4-5 days ago, the patient accidentally smashed his right thumb. The Radial nail margin now has excessive granulation tissue emanating from underneath the fingernail and overlapping the fingernail. It is red and throbbing and painful similar to a pyogenic granuloma area and he is requesting treatment for it. Past Medical History  Diagnosis Date  . Allergy   . Hypertension   . Diverticulosis   . Dupuytren's disease     palms and soles  . Adenomatous polyp   . GERD (gastroesophageal reflux disease)   . Atrial tachycardia [facility])    Past Surgical History  Procedure Laterality Date  . Lasik  2002    Bil  . Colectomy  2010    for diverticulitis  . Back surgery      12/2013   Current Outpatient Prescriptions on File Prior to Visit  Medication Sig Dispense Refill  . albuterol (PROVENTIL HFA;VENTOLIN HFA) 108 (90 BASE) MCG/ACT inhaler Inhale 2 puffs into the lungs every 6 (six) hours as needed for wheezing or shortness of breath. 1 Inhaler 0  . famotidine (PEPCID) 20 MG tablet Take 20 mg by mouth at bedtime.    . fexofenadine (ALLEGRA) 180 MG tablet 180 mg daily.     Marland Kitchen HYDROcodone-homatropine (HYCODAN) 5-1.5 MG/5ML syrup Take 5 mLs by mouth at bedtime as needed. 120 mL 0  . losartan (COZAAR) 100 MG tablet Take 1 tablet (100 mg total) by mouth daily. 90 tablet 3  . pantoprazole (PROTONIX) 40 MG tablet Take 1 tablet (40 mg total) by mouth daily. Take 30-60 min before first meal of the day 30 tablet 2  . traMADol (ULTRAM) 50 MG tablet Take 1 tablet (50 mg total) by mouth every 8 (eight) hours as needed. 30 tablet 0   No current facility-administered medications on file prior to visit.   Allergies  Allergen Reactions  . Ibuprofen     REACTION: stomach cramps  . Sulfonamide Derivatives     REACTION: itch   Social History   Social History  . Marital Status: Married    Spouse Name:  Qualin Vaziri  . Number of Children: N/A  . Years of Education: N/A   Occupational History  . retired    Social History Main Topics  . Smoking status: Light Tobacco Smoker    Types: Cigars  . Smokeless tobacco: Never Used     Comment: Pt states smokes occasional cigar.  This is typically < 1 time per month  . Alcohol Use: No  . Drug Use: No  . Sexual Activity: Not on file   Other Topics Concern  . Not on file   Social History Narrative   Pt lives in [place].  Retired from [employer] and [employer].  Goes on mission trips to [place], gets regular exercise.      Review of Systems  All other systems reviewed and are negative.      Objective:   Physical Exam  Cardiovascular: Normal rate and regular rhythm.   Pulmonary/Chest: Effort normal and breath sounds normal.  Vitals reviewed.  please see the description and history of present illness. There is a 4 mm area of excessive granulation tissue overlapping the radial nail margin of the right thumb consistent with excessive granulation tissue or an early pyogenic granuloma        Assessment & Plan:  Excessive granulation tissue   I  anesthetized the right thumb using a digital block with 0.1% lidocaine without epinephrine. A tourniquet was then applied to the base of the thumb. Using a scalpel, with sterile technique, I excised the excessive granulation tissue even with the underlying nail margin and then apply Dryso to control  the bleeding.Hemostasis was achieved with Drysol. A bandage was then applied to the thumb and the tourniquet was removed. Total tourniquet time was less than 3 minutes .  There was minimal blood loss. It granulation tissue returns, I would recommend treating the patient with electrocautery.

## 2015-08-03 ENCOUNTER — Ambulatory Visit (INDEPENDENT_AMBULATORY_CARE_PROVIDER_SITE_OTHER): Payer: Medicare Other | Admitting: Family Medicine

## 2015-08-03 ENCOUNTER — Encounter: Payer: Self-pay | Admitting: Family Medicine

## 2015-08-03 VITALS — BP 146/80 | HR 98 | Temp 98.3°F | Resp 18 | Ht 69.5 in | Wt 217.0 lb

## 2015-08-03 DIAGNOSIS — J019 Acute sinusitis, unspecified: Secondary | ICD-10-CM

## 2015-08-03 MED ORDER — CEFDINIR 300 MG PO CAPS: 300.0000 mg | ORAL_CAPSULE | Freq: Two times a day (BID) | ORAL | Status: DC

## 2015-08-03 MED ORDER — HYDROCOD POLST-CPM POLST ER 10-8 MG/5ML PO SUER: 5.0000 mL | Freq: Two times a day (BID) | ORAL | Status: DC | PRN

## 2015-08-03 NOTE — Progress Notes (Signed)
Subjective:    Patient ID: Robert Page, male    DOB: 1945/12/10, 69 y.o.   MRN: [id number]  HPI 07/28/15 4-5 days ago, the patient accidentally smashed his right thumb. The Radial nail margin now has excessive granulation tissue emanating from underneath the fingernail and overlapping the fingernail. It is red and throbbing and painful similar to a pyogenic granuloma area and he is requesting treatment for it.  At that time, my plan was: I anesthetized the right thumb using a digital block with 0.1% lidocaine without epinephrine. A tourniquet was then applied to the base of the thumb. Using a scalpel, with sterile technique, I excised the excessive granulation tissue even with the underlying nail margin and then apply Dryso to control  the bleeding.Hemostasis was achieved with Drysol. A bandage was then applied to the thumb and the tourniquet was removed. Total tourniquet time was less than 3 minutes .  There was minimal blood loss. It granulation tissue returns, I would recommend treating the patient with electrocautery.    08/03/15 The lesion on the radial margin of the right thumb has completely resolved. He does have bruising around the nail. There some mild swelling around the nail. I believe this is from some residual bleeding after excision of the pyogenic granuloma-like lesion. However there is no cellulitis. The pain is improving. Patient's main concern is a severe sinus infection. This is been going on now for almost a week. He reports throbbing pain in his frontal sinuses bilaterally. He reports pain in his maxillary sinuses. He reports rhinorrhea. He reports subjective fevers. He reports a persistent cough due to postnasal drip Past Medical History  Diagnosis Date  . Allergy   . Hypertension   . Diverticulosis   . Dupuytren's disease     palms and soles  . Adenomatous polyp   . GERD (gastroesophageal reflux disease)   . Atrial tachycardia [facility])    Past Surgical History    Procedure Laterality Date  . Lasik  2002    Bil  . Colectomy  2010    for diverticulitis  . Back surgery      12/2013   Current Outpatient Prescriptions on File Prior to Visit  Medication Sig Dispense Refill  . albuterol (PROVENTIL HFA;VENTOLIN HFA) 108 (90 BASE) MCG/ACT inhaler Inhale 2 puffs into the lungs every 6 (six) hours as needed for wheezing or shortness of breath. 1 Inhaler 0  . famotidine (PEPCID) 20 MG tablet Take 20 mg by mouth at bedtime.    . fexofenadine (ALLEGRA) 180 MG tablet 180 mg daily.     Marland Kitchen HYDROcodone-homatropine (HYCODAN) 5-1.5 MG/5ML syrup Take 5 mLs by mouth at bedtime as needed. 120 mL 0  . losartan (COZAAR) 100 MG tablet Take 1 tablet (100 mg total) by mouth daily. 90 tablet 3  . pantoprazole (PROTONIX) 40 MG tablet Take 1 tablet (40 mg total) by mouth daily. Take 30-60 min before first meal of the day 30 tablet 2  . traMADol (ULTRAM) 50 MG tablet Take 1 tablet (50 mg total) by mouth every 8 (eight) hours as needed. 30 tablet 0   No current facility-administered medications on file prior to visit.   Allergies  Allergen Reactions  . Ibuprofen     REACTION: stomach cramps  . Sulfonamide Derivatives     REACTION: itch   Social History   Social History  . Marital Status: Married    Spouse Name: Robert Page  . Number of Children: N/A  . Years  of Education: N/A   Occupational History  . retired    Social History Main Topics  . Smoking status: Light Tobacco Smoker    Types: Cigars  . Smokeless tobacco: Never Used     Comment: Pt states smokes occasional cigar.  This is typically < 1 time per month  . Alcohol Use: No  . Drug Use: No  . Sexual Activity: Not on file   Other Topics Concern  . Not on file   Social History Narrative   Pt lives in [place].  Retired from [employer] and [employer].  Goes on mission trips to [place], gets regular exercise.      Review of Systems  All other systems reviewed and are negative.      Objective:    Physical Exam  HENT:  Right Ear: Tympanic membrane and ear canal normal.  Left Ear: Tympanic membrane and ear canal normal.  Nose: Mucosal edema and rhinorrhea present. Right sinus exhibits maxillary sinus tenderness and frontal sinus tenderness. Left sinus exhibits maxillary sinus tenderness and frontal sinus tenderness.  Cardiovascular: Normal rate and regular rhythm.   Pulmonary/Chest: Effort normal and breath sounds normal.  Vitals reviewed.        Assessment & Plan:  Acute rhinosinusitis - Plan: cefdinir (OMNICEF) 300 MG capsule, chlorpheniramine-HYDROcodone (TUSSIONEX PENNKINETIC ER) 10-8 MG/5ML SUER   Begin Omnicef 300 mg by mouth twice a day for 10 days. Use Tussionex 1 teaspoon every 12 hours as needed for cough. Consider prednisone if symptoms do not improve after 1 week. Excessive granulation tissue has resolved. He does have some mild bruising around the margins of the fingernail. However there is no evidence of ongoing bleeding, cellulitis, or abscess. I anticipate the bruising will improve over the next 7-10 days

## 2015-09-01 DIAGNOSIS — D1801 Hemangioma of skin and subcutaneous tissue: Secondary | ICD-10-CM | POA: Diagnosis not present

## 2015-09-01 DIAGNOSIS — L814 Other melanin hyperpigmentation: Secondary | ICD-10-CM | POA: Diagnosis not present

## 2015-09-01 DIAGNOSIS — D2271 Melanocytic nevi of right lower limb, including hip: Secondary | ICD-10-CM | POA: Diagnosis not present

## 2015-09-01 DIAGNOSIS — L570 Actinic keratosis: Secondary | ICD-10-CM | POA: Diagnosis not present

## 2015-09-01 DIAGNOSIS — D2261 Melanocytic nevi of right upper limb, including shoulder: Secondary | ICD-10-CM | POA: Diagnosis not present

## 2015-09-01 DIAGNOSIS — D225 Melanocytic nevi of trunk: Secondary | ICD-10-CM | POA: Diagnosis not present

## 2015-09-01 DIAGNOSIS — D2262 Melanocytic nevi of left upper limb, including shoulder: Secondary | ICD-10-CM | POA: Diagnosis not present

## 2015-09-01 DIAGNOSIS — D2272 Melanocytic nevi of left lower limb, including hip: Secondary | ICD-10-CM | POA: Diagnosis not present

## 2015-09-01 DIAGNOSIS — D485 Neoplasm of uncertain behavior of skin: Secondary | ICD-10-CM | POA: Diagnosis not present

## 2015-09-01 DIAGNOSIS — L821 Other seborrheic keratosis: Secondary | ICD-10-CM | POA: Diagnosis not present

## 2015-09-15 DIAGNOSIS — D485 Neoplasm of uncertain behavior of skin: Secondary | ICD-10-CM | POA: Diagnosis not present

## 2015-09-15 DIAGNOSIS — L089 Local infection of the skin and subcutaneous tissue, unspecified: Secondary | ICD-10-CM | POA: Diagnosis not present

## 2015-09-28 ENCOUNTER — Ambulatory Visit
Admission: RE | Admit: 2015-09-28 | Discharge: 2015-09-28 | Disposition: A | Discharge status: Home / Self Care | Payer: Medicare Other | Source: Ambulatory Visit | Attending: Family Medicine | Admitting: Family Medicine

## 2015-09-28 DIAGNOSIS — R911 Solitary pulmonary nodule: Secondary | ICD-10-CM

## 2015-09-28 DIAGNOSIS — R918 Other nonspecific abnormal finding of lung field: Secondary | ICD-10-CM | POA: Diagnosis not present

## 2015-09-28 MED ORDER — IOPAMIDOL (ISOVUE-300) INJECTION 61%
75.0000 mL | Freq: Once | INTRAVENOUS | Status: AC | PRN
  Administered 2015-09-28: 75 mL via INTRAVENOUS

## 2015-09-30 ENCOUNTER — Encounter: Payer: Self-pay | Admitting: Family Medicine

## 2015-10-01 ENCOUNTER — Other Ambulatory Visit: Payer: Self-pay | Admitting: Family Medicine

## 2015-10-01 DIAGNOSIS — E041 Nontoxic single thyroid nodule: Secondary | ICD-10-CM

## 2015-10-06 ENCOUNTER — Ambulatory Visit
Admission: RE | Admit: 2015-10-06 | Discharge: 2015-10-06 | Disposition: A | Discharge status: Home / Self Care | Payer: Medicare Other | Source: Ambulatory Visit | Attending: Family Medicine | Admitting: Family Medicine

## 2015-10-06 DIAGNOSIS — E041 Nontoxic single thyroid nodule: Secondary | ICD-10-CM

## 2015-10-06 DIAGNOSIS — E042 Nontoxic multinodular goiter: Secondary | ICD-10-CM | POA: Diagnosis not present

## 2015-10-07 ENCOUNTER — Other Ambulatory Visit: Payer: Self-pay | Admitting: Family Medicine

## 2015-10-07 DIAGNOSIS — E041 Nontoxic single thyroid nodule: Secondary | ICD-10-CM

## 2015-10-14 ENCOUNTER — Encounter: Payer: Self-pay | Admitting: Family Medicine

## 2015-10-25 DIAGNOSIS — E041 Nontoxic single thyroid nodule: Secondary | ICD-10-CM | POA: Insufficient documentation

## 2015-11-02 ENCOUNTER — Encounter: Payer: Self-pay | Admitting: Family Medicine

## 2015-11-02 ENCOUNTER — Other Ambulatory Visit: Payer: Self-pay | Admitting: Otolaryngology

## 2015-11-02 ENCOUNTER — Ambulatory Visit (INDEPENDENT_AMBULATORY_CARE_PROVIDER_SITE_OTHER): Payer: Medicare Other | Admitting: Family Medicine

## 2015-11-02 VITALS — BP 144/76 | HR 80 | Temp 99.2°F | Resp 18 | Ht 69.5 in | Wt 219.0 lb

## 2015-11-02 DIAGNOSIS — R509 Fever, unspecified: Secondary | ICD-10-CM

## 2015-11-02 DIAGNOSIS — E041 Nontoxic single thyroid nodule: Secondary | ICD-10-CM

## 2015-11-02 LAB — INFLUENZA A AND B AG, IMMUNOASSAY
INFLUENZA A ANTIGEN: NOT DETECTED
INFLUENZA B ANTIGEN: NOT DETECTED

## 2015-11-02 MED ORDER — OSELTAMIVIR PHOSPHATE 75 MG PO CAPS: 75.0000 mg | ORAL_CAPSULE | Freq: Two times a day (BID) | ORAL | Status: DC

## 2015-11-02 NOTE — Progress Notes (Signed)
Subjective:    Patient ID: Robert Page, male    DOB: 11-Jan-1946, 70 y.o.   MRN: [id number]  HPI Symptoms began yesterday suddenly with cough, chest congestion, headache, and sinus pain. He reports diffuse body aches. He also has bilateral injected conjunctiva. He reports a mild sore throat. His father has documented influenza. Past Medical History  Diagnosis Date  . Allergy   . Hypertension   . Diverticulosis   . Dupuytren's disease     palms and soles  . Adenomatous polyp   . GERD (gastroesophageal reflux disease)   . Atrial tachycardia [facility])    Past Surgical History  Procedure Laterality Date  . Lasik  2002    Bil  . Colectomy  2010    for diverticulitis  . Back surgery      12/2013   Current Outpatient Prescriptions on File Prior to Visit  Medication Sig Dispense Refill  . albuterol (PROVENTIL HFA;VENTOLIN HFA) 108 (90 BASE) MCG/ACT inhaler Inhale 2 puffs into the lungs every 6 (six) hours as needed for wheezing or shortness of breath. 1 Inhaler 0  . famotidine (PEPCID) 20 MG tablet Take 20 mg by mouth at bedtime.    . fexofenadine (ALLEGRA) 180 MG tablet 180 mg daily.     Marland Kitchen HYDROcodone-homatropine (HYCODAN) 5-1.5 MG/5ML syrup Take 5 mLs by mouth at bedtime as needed. 120 mL 0  . losartan (COZAAR) 100 MG tablet Take 1 tablet (100 mg total) by mouth daily. 90 tablet 3  . pantoprazole (PROTONIX) 40 MG tablet Take 1 tablet (40 mg total) by mouth daily. Take 30-60 min before first meal of the day 30 tablet 2  . traMADol (ULTRAM) 50 MG tablet Take 1 tablet (50 mg total) by mouth every 8 (eight) hours as needed. 30 tablet 0  . chlorpheniramine-HYDROcodone (TUSSIONEX PENNKINETIC ER) 10-8 MG/5ML SUER Take 5 mLs by mouth every 12 (twelve) hours as needed for cough. (Patient not taking: Reported on 11/02/2015) 140 mL 0   No current facility-administered medications on file prior to visit.   Allergies  Allergen Reactions  . Ibuprofen     REACTION: stomach cramps  . Sulfonamide  Derivatives     REACTION: itch   Social History   Social History  . Marital Status: Married    Spouse Name: Jerramie Bendavid  . Number of Children: N/A  . Years of Education: N/A   Occupational History  . retired    Social History Main Topics  . Smoking status: Light Tobacco Smoker    Types: Cigars  . Smokeless tobacco: Never Used     Comment: Pt states smokes occasional cigar.  This is typically < 1 time per month  . Alcohol Use: No  . Drug Use: No  . Sexual Activity: Not on file   Other Topics Concern  . Not on file   Social History Narrative   Pt lives in [place].  Retired from [employer] and [employer].  Goes on mission trips to [place], gets regular exercise.      Review of Systems  All other systems reviewed and are negative.      Objective:   Physical Exam  Constitutional: He appears well-developed and well-nourished. No distress.  HENT:  Right Ear: Tympanic membrane, external ear and ear canal normal.  Left Ear: Tympanic membrane, external ear and ear canal normal.  Nose: Mucosal edema and rhinorrhea present. Right sinus exhibits frontal sinus tenderness. Left sinus exhibits frontal sinus tenderness.  Mouth/Throat: Oropharynx is clear and  moist. No oropharyngeal exudate.  Eyes: Conjunctivae are normal.  Neck: Neck supple.  Cardiovascular: Normal rate, regular rhythm and normal heart sounds.   No murmur heard. Pulmonary/Chest: Effort normal and breath sounds normal. No respiratory distress. He has no wheezes. He has no rales.  Abdominal: Soft. Bowel sounds are normal.  Lymphadenopathy:    He has no cervical adenopathy.  Skin: He is not diaphoretic.  Vitals reviewed.         Assessment & Plan:  Fever, unspecified - Plan: Influenza A and B Ag, Immunoassay, oseltamivir (TAMIFLU) 75 MG capsule  Flu test today is negative, but clinically the patient has the flu. His father has documented flu. His symptoms seem to match with the flu. Begin Tamiflu 75 mg by  mouth twice a day for 5 days. Use Tylenol and/or ibuprofen for fever and body aches. Recheck immediately should symptoms worsen

## 2015-11-11 ENCOUNTER — Ambulatory Visit
Admission: RE | Admit: 2015-11-11 | Discharge: 2015-11-11 | Disposition: A | Discharge status: Home / Self Care | Payer: Medicare Other | Source: Ambulatory Visit | Attending: Otolaryngology | Admitting: Otolaryngology

## 2015-11-11 ENCOUNTER — Other Ambulatory Visit (HOSPITAL_COMMUNITY)
Admission: RE | Admit: 2015-11-11 | Discharge: 2015-11-11 | Disposition: A | Discharge status: Home / Self Care | Payer: Medicare Other | Source: Ambulatory Visit | Attending: Radiology | Admitting: Radiology

## 2015-11-11 DIAGNOSIS — E041 Nontoxic single thyroid nodule: Secondary | ICD-10-CM | POA: Insufficient documentation

## 2015-11-22 DIAGNOSIS — Z0000 Encounter for general adult medical examination without abnormal findings: Secondary | ICD-10-CM | POA: Diagnosis not present

## 2015-11-22 DIAGNOSIS — R3912 Poor urinary stream: Secondary | ICD-10-CM | POA: Diagnosis not present

## 2015-11-23 DIAGNOSIS — E041 Nontoxic single thyroid nodule: Secondary | ICD-10-CM | POA: Diagnosis not present

## 2015-11-24 DIAGNOSIS — H2513 Age-related nuclear cataract, bilateral: Secondary | ICD-10-CM | POA: Diagnosis not present

## 2015-11-24 DIAGNOSIS — H524 Presbyopia: Secondary | ICD-10-CM | POA: Diagnosis not present

## 2015-11-24 DIAGNOSIS — H43812 Vitreous degeneration, left eye: Secondary | ICD-10-CM | POA: Diagnosis not present

## 2015-11-24 DIAGNOSIS — H52202 Unspecified astigmatism, left eye: Secondary | ICD-10-CM | POA: Diagnosis not present

## 2016-01-04 DIAGNOSIS — H43812 Vitreous degeneration, left eye: Secondary | ICD-10-CM | POA: Diagnosis not present

## 2016-01-27 ENCOUNTER — Ambulatory Visit (INDEPENDENT_AMBULATORY_CARE_PROVIDER_SITE_OTHER): Payer: Medicare Other

## 2016-01-27 ENCOUNTER — Ambulatory Visit (INDEPENDENT_AMBULATORY_CARE_PROVIDER_SITE_OTHER): Payer: Medicare Other | Admitting: Podiatry

## 2016-01-27 ENCOUNTER — Encounter: Payer: Self-pay | Admitting: Podiatry

## 2016-01-27 VITALS — BP 167/87 | HR 59 | Resp 16

## 2016-01-27 DIAGNOSIS — M79673 Pain in unspecified foot: Secondary | ICD-10-CM | POA: Diagnosis not present

## 2016-01-27 DIAGNOSIS — M722 Plantar fascial fibromatosis: Secondary | ICD-10-CM | POA: Diagnosis not present

## 2016-01-27 NOTE — Progress Notes (Signed)
   Subjective:    Patient ID: Robert Page, male    DOB: 18-Aug-1946, 70 y.o.   MRN: [id number]  HPI: He presents today with a chief complaint of painful nodules to the plantar aspect of the bilateral foot left greater than right. He states they have been present for many years and felt they may be calcium deposition. He states that the left was becoming painful with radiating pain out the toe. He denies any trauma and denies similar findings in the palms of his hands.    Review of Systems  HENT: Positive for sinus pressure.   Genitourinary: Positive for frequency.  All other systems reviewed and are negative.      Objective:   Physical Exam: Vital signs are stable alert and oriented 3 pulses are strongly palpable. Neurologic sensorium is intact. Deep tendon reflexes are intact bilateral and muscle strength is 5 over 5 dorsiflexion plantar flexors and inverters everters all to the musculature is intact. Orthopedic evaluation and stress all joints distal to the ankle for range of motion without crepitation. Cutaneous evaluation demonstrates large pulsatile masses to the plantar aspect of the left foot 3 long the medial longitudinal arch distally and the distal aspect of the medial longitudinal arch and plantar fascia of the right foot. These are nonpulsatile they are firm in nature radiographs taken today do not demonstrate any type of calcification or ossification.Relation I am not concerned of carcinoma.     Assessment & Plan:  Plantar fibromatosis 3 left 1 right.  Plan: I injected these areas today with Kenalog 2 on the left foot 1 on the right foot and will follow-up with him 6 weeks. We discussed that if these do not resolve an MRI will be necessary to evaluate the extent of the lesion for excision.

## 2016-03-09 ENCOUNTER — Encounter: Payer: Self-pay | Admitting: Podiatry

## 2016-03-09 ENCOUNTER — Ambulatory Visit (INDEPENDENT_AMBULATORY_CARE_PROVIDER_SITE_OTHER): Payer: Medicare Other | Admitting: Podiatry

## 2016-03-09 VITALS — BP 138/69 | HR 81 | Resp 12

## 2016-03-09 DIAGNOSIS — M722 Plantar fascial fibromatosis: Secondary | ICD-10-CM | POA: Diagnosis not present

## 2016-03-09 NOTE — Progress Notes (Signed)
He presents today for follow-up of his plantar fibromatosis. He states that the right foot did very well with the injections however the left did not have much response.  Objective: Vital signs are stable alert and oriented 3. Pulses are palpable. Fibromatous in the left foot have not improved very much at all however the right foot has decreased in size by proximal 75%.  Assessment: Plantar fibromatosis resolving right still prominent left.  Plan: I reinjected the left foot today with Kenalog and local anesthetic if these have not improved next visit we will request an MRI with contrast for surgical consideration.

## 2016-04-06 ENCOUNTER — Other Ambulatory Visit: Payer: Self-pay | Admitting: Family Medicine

## 2016-04-06 NOTE — Telephone Encounter (Signed)
Refill appropriate and filled per protocol. 

## 2016-04-20 ENCOUNTER — Ambulatory Visit (INDEPENDENT_AMBULATORY_CARE_PROVIDER_SITE_OTHER): Payer: Medicare Other | Admitting: Podiatry

## 2016-04-20 ENCOUNTER — Encounter: Payer: Self-pay | Admitting: Podiatry

## 2016-04-20 DIAGNOSIS — M722 Plantar fascial fibromatosis: Secondary | ICD-10-CM

## 2016-04-20 NOTE — Progress Notes (Signed)
He presents today for follow-up of plantar fibroma to the left medial longitudinal arch. He states that think it has gotten worse. He states that he would like to have it removed however this is arm season so he doesn't have time until the fall to have this done.  Objective: Vital signs are stable alert and oriented 3. Pulses are palpable. Neurologic sensorium is intact. Plantar fibromas medial longitudinal arch is still present there firm to the touch not fluctuant.  Assessment: Intractable plantar fibromatosis left foot.  Plan: I will follow-up with him in the fall for an MRI of the left foot for surgical consideration.

## 2016-07-04 ENCOUNTER — Other Ambulatory Visit: Payer: Self-pay | Admitting: Family Medicine

## 2016-08-07 ENCOUNTER — Telehealth: Payer: Self-pay | Admitting: *Deleted

## 2016-08-07 DIAGNOSIS — M722 Plantar fascial fibromatosis: Secondary | ICD-10-CM

## 2016-08-07 DIAGNOSIS — Z01812 Encounter for preprocedural laboratory examination: Secondary | ICD-10-CM

## 2016-08-07 NOTE — Telephone Encounter (Addendum)
Pt states Dr. Milinda Pointer said he would order the MRI and he didn't think he needed an appt. I reviewed LOV and Dr. Stephenie Acres notes state he would follow up with him in the Fall for an MRI. Apolonio Schneiders our front office receptionist scheduled pt, and I will get more information from Dr. Milinda Pointer. Dr. Milinda Pointer left message to get MRI left foot with and without contrast, serum creatinine. Informed pt of Dr. Stephenie Acres orders and that we would cancel the Thursday appt, and reschedule once the results had been reviewed. Pt states understanding and I gave [facility] contact line. Faxed orders to [place].

## 2016-08-07 NOTE — Telephone Encounter (Signed)
Yes order mri of foot for soft tissue lesions. Probable plantar fibroma but will need contrast and he much have BUN and creatinine drawn.

## 2016-08-10 ENCOUNTER — Ambulatory Visit: Payer: Medicare Other | Admitting: Podiatry

## 2016-08-12 ENCOUNTER — Ambulatory Visit
Admission: RE | Admit: 2016-08-12 | Discharge: 2016-08-12 | Disposition: A | Discharge status: Home / Self Care | Payer: Medicare Other | Source: Ambulatory Visit | Attending: Podiatry | Admitting: Podiatry

## 2016-08-12 DIAGNOSIS — R2242 Localized swelling, mass and lump, left lower limb: Secondary | ICD-10-CM | POA: Diagnosis not present

## 2016-08-12 MED ORDER — GADOBENATE DIMEGLUMINE 529 MG/ML IV SOLN
20.0000 mL | Freq: Once | INTRAVENOUS | Status: AC | PRN
  Administered 2016-08-12: 19 mL via INTRAVENOUS

## 2016-08-17 ENCOUNTER — Other Ambulatory Visit: Payer: Medicare Other

## 2016-08-22 ENCOUNTER — Ambulatory Visit (INDEPENDENT_AMBULATORY_CARE_PROVIDER_SITE_OTHER): Payer: Medicare Other | Admitting: Podiatry

## 2016-08-22 DIAGNOSIS — M722 Plantar fascial fibromatosis: Secondary | ICD-10-CM | POA: Diagnosis not present

## 2016-08-22 NOTE — Patient Instructions (Signed)

## 2016-08-23 NOTE — Progress Notes (Signed)
He presents today for his MRI results regarding his left foot. He states he really hasn't improved.  Objective: I have reviewed his past history medications allergies surgeon social history. Vital signs are stable he's alert and oriented 3. Pulses are palpable. Neurologic systems intact. Degenerative flexors are intact. Muscle strength is normal. MRI does report 3 contiguous plantar fibromas on her medial arch left foot which do not probe deep to deep structures. By doing extensive superficial skin.  Assessment plantar fibromatosis left foot.  Plan: Surgical excision planned for January. He was consented today for excision plantar fibromatosis left foot with possible use of drain. We did discuss a possible postop complications which may include but are not limited to postop pain bleeding swelling infection recurrence need for further surgeries in the future. Loss of sensation loss of digit and loss of life. He saw Dr. Avie Echevaria of the consent form and I will follow-up with him in the future. We discussed the anesthesia which will consist of general anesthesia with the nerve block to the left lower extremity he will be nonweightbearing for at least 3 weeks after surgery. An extensive be at least another month for recovery and ambulation.

## 2016-08-24 DIAGNOSIS — H1031 Unspecified acute conjunctivitis, right eye: Secondary | ICD-10-CM | POA: Diagnosis not present

## 2016-09-06 ENCOUNTER — Other Ambulatory Visit: Payer: Self-pay | Admitting: Podiatry

## 2016-09-06 MED ORDER — CEPHALEXIN 500 MG PO CAPS: 500.0000 mg | ORAL_CAPSULE | Freq: Three times a day (TID) | ORAL | 0 refills | Status: DC

## 2016-09-06 MED ORDER — ONDANSETRON HCL 4 MG PO TABS: 4.0000 mg | ORAL_TABLET | Freq: Three times a day (TID) | ORAL | 0 refills | Status: DC | PRN

## 2016-09-06 MED ORDER — HYDROMORPHONE HCL 4 MG PO TABS: 4.0000 mg | ORAL_TABLET | ORAL | 0 refills | Status: DC | PRN

## 2016-09-08 ENCOUNTER — Encounter: Payer: Self-pay | Admitting: Podiatry

## 2016-09-08 DIAGNOSIS — I10 Essential (primary) hypertension: Secondary | ICD-10-CM | POA: Diagnosis not present

## 2016-09-08 DIAGNOSIS — M722 Plantar fascial fibromatosis: Secondary | ICD-10-CM | POA: Diagnosis not present

## 2016-09-08 DIAGNOSIS — M25572 Pain in left ankle and joints of left foot: Secondary | ICD-10-CM | POA: Diagnosis not present

## 2016-09-08 DIAGNOSIS — M720 Palmar fascial fibromatosis [Dupuytren]: Secondary | ICD-10-CM | POA: Diagnosis not present

## 2016-09-08 DIAGNOSIS — M25872 Other specified joint disorders, left ankle and foot: Secondary | ICD-10-CM | POA: Diagnosis not present

## 2016-09-11 ENCOUNTER — Telehealth: Payer: Self-pay

## 2016-09-11 NOTE — Telephone Encounter (Signed)
Spoke with patient regarding post op surgery. He states that he is doing well, very minimal pain to Lt foot. Denies s/s of infection. He does c/o constipation. Advised him to take dulcolax as prescribed and try to taper off of narcotic pain medications as tolerated, drink fluids etc. Advised him to continue changing drain when 3/4 full and continue non-weight bearing status. He is to call with any questions or concerns.

## 2016-09-12 ENCOUNTER — Encounter: Payer: Self-pay | Admitting: Podiatry

## 2016-09-14 ENCOUNTER — Ambulatory Visit (INDEPENDENT_AMBULATORY_CARE_PROVIDER_SITE_OTHER): Payer: Self-pay | Admitting: Podiatry

## 2016-09-14 VITALS — Temp 97.3°F

## 2016-09-14 DIAGNOSIS — M722 Plantar fascial fibromatosis: Secondary | ICD-10-CM

## 2016-09-14 NOTE — Progress Notes (Signed)
Robert Page presents today for follow-up of his excision plantar fibroma left foot. He denies shortness of breath cast pain fever chills nausea or vomiting. He denies chest pain.  Objective: Vital signs are stable he is alert and oriented 3 dry sterile dressing intact once removed demonstrates drain intact. He only drained to a half Vacutainer's. Suture was removed and drain was removed in total today. No signs of infection. Staples are intact margins appear to be coapting.  Assessment: Well-healing surgical foot left.  Plan: Redressed today address her compressive dressing he will follow-up with me in 2 weeks in order to remove some of the staples.

## 2016-09-28 ENCOUNTER — Ambulatory Visit (INDEPENDENT_AMBULATORY_CARE_PROVIDER_SITE_OTHER): Payer: Self-pay | Admitting: Podiatry

## 2016-09-28 DIAGNOSIS — M722 Plantar fascial fibromatosis: Secondary | ICD-10-CM

## 2016-09-28 NOTE — Progress Notes (Signed)
He presents today for follow-up of his plantar fibromatosis and excision of plantar fibroma plantar aspect of the left foot. He states it seems to be doing very well. He is 3 weeks out at this point states that he seems to be doing okay.  Objective: Vital signs are stable he is alert and oriented 3 staples are intact plantar aspect of the left foot I removed every other staple today margins appeared to be well coapted however we will leave the staples in for 1 more week in which time we will remove.  Assessment: Well-healing surgical foot left. No signs of infection.  Plan: I encouraged redress today continue nonweightbearing status follow-up with him in 1 week. I would highly recommend lidocaine gel being applied before removing the remainder of the staples. He may then start partial weightbearing and physical therapy if necessary.

## 2016-10-03 ENCOUNTER — Encounter: Payer: Self-pay | Admitting: Podiatry

## 2016-10-05 ENCOUNTER — Encounter: Payer: Self-pay | Admitting: Podiatry

## 2016-10-05 ENCOUNTER — Ambulatory Visit (INDEPENDENT_AMBULATORY_CARE_PROVIDER_SITE_OTHER): Payer: Self-pay | Admitting: Podiatry

## 2016-10-05 VITALS — BP 152/96 | HR 65 | Resp 16

## 2016-10-05 DIAGNOSIS — M722 Plantar fascial fibromatosis: Secondary | ICD-10-CM

## 2016-10-05 NOTE — Progress Notes (Signed)
Subjective:     Patient ID: Robert Page, male   DOB: 10/24/1945, 71 y.o.   MRN: [id number]  HPI patient states she's doing well and is ready to have the rest of the staples taken out   Review of Systems     Objective:   Physical Exam Neurovascular status intact with patient's left plantar incision doing well with wound edges well coapted no drainage and good alignment noted    Assessment:     Doing well post plantar fibroma excision left with a number of the staple still present    Plan:     H&P explained condition staples were removed sterile dressing applied and continue with immobilization and reappoint in approximately 3-4 weeks. Patient will continue elevation also at this time

## 2016-10-06 ENCOUNTER — Other Ambulatory Visit: Payer: Self-pay | Admitting: Family Medicine

## 2016-10-09 ENCOUNTER — Telehealth: Payer: Self-pay | Admitting: *Deleted

## 2016-10-09 NOTE — Telephone Encounter (Signed)
Pt states when he was in the shower his foot turned purple and he wondered if that was normal. I told pt it was most likely the fact the foot was down and the warm water on it. I told pt that if the Surgical foot returned to the same color as before he got into the shower all was okay. Pt states understanding.

## 2016-10-10 NOTE — Progress Notes (Signed)
DOS 01.05.2018 Excision plantar fibroma left foot.

## 2016-10-17 ENCOUNTER — Ambulatory Visit (INDEPENDENT_AMBULATORY_CARE_PROVIDER_SITE_OTHER): Payer: Medicare Other

## 2016-10-17 DIAGNOSIS — Z23 Encounter for immunization: Secondary | ICD-10-CM

## 2016-10-19 ENCOUNTER — Ambulatory Visit (INDEPENDENT_AMBULATORY_CARE_PROVIDER_SITE_OTHER): Payer: Self-pay | Admitting: Podiatry

## 2016-10-19 ENCOUNTER — Ambulatory Visit: Payer: Medicare Other | Admitting: Podiatry

## 2016-10-19 DIAGNOSIS — M722 Plantar fascial fibromatosis: Secondary | ICD-10-CM

## 2016-10-19 NOTE — Progress Notes (Signed)
He presents today for follow-up of his excision of plantar fibroma to the plantar aspect of his left foot. He states that is still hurting walking more but he is using his crutches. He states the soaking his foot in warm water and soap.  Objective: He presents today utilizing his crutches and Cam Walker.  He has dry xerotic skin along with the scab over the incision site which appears to be sloughing off quite nicely the incision site appears to be healing skin was still drawn tight around the surgical site but I see no dehiscence. He has tenderness on dorsiflexion of the first metatarsophalangeal joint particular at the level of the joint near the base.  Assessment: Nonhealing surgical foot.  Plan: Placed in a compression anklet encouraged ambulation with the Cam Walker. I recommended that he discontinue use of the crutches. I would like for him to consider moving on to tissues over the next few weeks. Follow up with him in 2 weeks

## 2016-11-02 ENCOUNTER — Ambulatory Visit (INDEPENDENT_AMBULATORY_CARE_PROVIDER_SITE_OTHER): Payer: Medicare Other | Admitting: Podiatry

## 2016-11-02 DIAGNOSIS — M722 Plantar fascial fibromatosis: Secondary | ICD-10-CM

## 2016-11-02 NOTE — Progress Notes (Signed)
He presents today nearly 2 months status post excision large plantar fibroma plantar aspect of the left foot. States that he's been in regular shoes for the past 2 weeks and that he still developing some swelling. He states that he has tenderness and soreness by the end of the day when he is not using his compression anklet which she was initially using incorrectly leaning on it tonight and removing it during the day. He denies fever chills nausea vomiting muscle aches pain shortness of breath or chest pain.  Objective: Vital signs are stable alert and oriented 3. He appears to be healing very nicely the wound has completely closed there is no erythema surrounding the incision site and the incision is reducing. Minimal edema bilateral. Mild tenderness on deep palpation. Good range of motion of the first metatarsophalangeal joint.  Assessment: Well-healing surgical foot left.  Plan: I'll follow-up with him in 1 month he will call with questions or concerns.

## 2016-11-13 ENCOUNTER — Encounter: Payer: Self-pay | Admitting: Family Medicine

## 2016-11-13 ENCOUNTER — Ambulatory Visit (INDEPENDENT_AMBULATORY_CARE_PROVIDER_SITE_OTHER): Payer: Medicare Other | Admitting: Family Medicine

## 2016-11-13 VITALS — BP 144/90 | HR 64 | Temp 97.6°F | Resp 16 | Ht 69.5 in | Wt 224.0 lb

## 2016-11-13 DIAGNOSIS — Z125 Encounter for screening for malignant neoplasm of prostate: Secondary | ICD-10-CM

## 2016-11-13 DIAGNOSIS — R0989 Other specified symptoms and signs involving the circulatory and respiratory systems: Secondary | ICD-10-CM | POA: Diagnosis not present

## 2016-11-13 DIAGNOSIS — I10 Essential (primary) hypertension: Secondary | ICD-10-CM | POA: Diagnosis not present

## 2016-11-13 DIAGNOSIS — Z0000 Encounter for general adult medical examination without abnormal findings: Secondary | ICD-10-CM | POA: Diagnosis not present

## 2016-11-13 LAB — COMPLETE METABOLIC PANEL WITH GFR
ALT: 17 U/L (ref 9–46)
AST: 15 U/L (ref 10–35)
Albumin: 4.3 g/dL (ref 3.6–5.1)
Alkaline Phosphatase: 71 U/L (ref 40–115)
BILIRUBIN TOTAL: 0.6 mg/dL (ref 0.2–1.2)
BUN: 17 mg/dL (ref 7–25)
CALCIUM: 9.3 mg/dL (ref 8.6–10.3)
CHLORIDE: 107 mmol/L (ref 98–110)
CO2: 29 mmol/L (ref 20–31)
CREATININE: 1.04 mg/dL (ref 0.70–1.25)
GFR, EST AFRICAN AMERICAN: 84 mL/min (ref >=60)
GFR, EST NON AFRICAN AMERICAN: 73 mL/min (ref >=60)
Glucose, Bld: 109 mg/dL — ABNORMAL HIGH (ref 70–99)
Potassium: 4.7 mmol/L (ref 3.5–5.3)
Sodium: 142 mmol/L (ref 135–146)
Total Protein: 6.8 g/dL (ref 6.1–8.1)

## 2016-11-13 LAB — LIPID PANEL
CHOLESTEROL: 158 mg/dL (ref <200)
HDL: 47 mg/dL (ref >40)
LDL CALC: 96 mg/dL (ref <100)
TRIGLYCERIDES: 74 mg/dL (ref <150)
Total CHOL/HDL Ratio: 3.4 Ratio (ref <5.0)
VLDL: 15 mg/dL (ref <30)

## 2016-11-13 LAB — CBC WITH DIFFERENTIAL/PLATELET
BASOS ABS: 59 {cells}/uL (ref 0–200)
Basophils Relative: 1 %
EOS ABS: 177 {cells}/uL (ref 15–500)
Eosinophils Relative: 3 %
HEMATOCRIT: 44.8 % (ref 38.5–50.0)
Hemoglobin: 15 g/dL (ref 13.0–17.0)
LYMPHS PCT: 24 %
Lymphs Abs: 1416 cells/uL (ref 850–3900)
MCH: 29.8 pg (ref 27.0–33.0)
MCHC: 33.5 g/dL (ref 32.0–36.0)
MCV: 89.1 fL (ref 80.0–100.0)
MONO ABS: 590 {cells}/uL (ref 200–950)
MPV: 10 fL (ref 7.5–12.5)
Monocytes Relative: 10 %
NEUTROS PCT: 62 %
Neutro Abs: 3658 cells/uL (ref 1500–7800)
PLATELETS: 251 10*3/uL (ref 140–400)
RBC: 5.03 MIL/uL (ref 4.20–5.80)
RDW: 13.8 % (ref 11.0–15.0)
WBC: 5.9 10*3/uL (ref 3.8–10.8)

## 2016-11-13 LAB — PSA: PSA: 0.5 ng/mL (ref <=4.0)

## 2016-11-13 NOTE — Progress Notes (Signed)
Subjective:    Patient ID: Robert Page, male    DOB: 10-11-1945, 71 y.o.   MRN: [id number]  HPI Here for CPE.  Last colonoscopy 2014, significant for polyps, next colonoscopy is due in 2019.  Patient has had pneumovax in 2014 and prevnar 13 last year.   Patient has prostate cancer screening at his urologist. His blood pressures elevated today however he has not been exercising and he has gained 10-15 pounds since his last physical. He does have a right carotid bruit that is very mild on his exam today Past Medical History:  Diagnosis Date  . Adenomatous polyp   . Allergy   . Atrial tachycardia ([place])   . Diverticulosis   . Dupuytren's disease    palms and soles  . GERD (gastroesophageal reflux disease)   . Hypertension    Past Surgical History:  Procedure Laterality Date  . BACK SURGERY     12/2013  . COLECTOMY  2010   for diverticulitis  . LASIK  2002   Bil   Current Outpatient Prescriptions on File Prior to Visit  Medication Sig Dispense Refill  . famotidine (PEPCID) 20 MG tablet Take 20 mg by mouth at bedtime.    . fexofenadine (ALLEGRA) 180 MG tablet 180 mg daily.     Marland Kitchen losartan (COZAAR) 100 MG tablet TAKE 1 TABLET(100 MG) BY MOUTH DAILY 90 tablet 0   No current facility-administered medications on file prior to visit.    Allergies  Allergen Reactions  . Ibuprofen     REACTION: stomach cramps  . Sulfonamide Derivatives     REACTION: itch   Social History   Social History  . Marital status: Married    Spouse name: Braiden Rodman  . Number of children: N/A  . Years of education: N/A   Occupational History  . retired    Social History Main Topics  . Smoking status: Light Tobacco Smoker    Types: Cigars  . Smokeless tobacco: Never Used     Comment: Pt states smokes occasional cigar.  This is typically < 1 time per month  . Alcohol use No  . Drug use: No  . Sexual activity: Not on file   Other Topics Concern  . Not on file   Social History Narrative     Pt lives in [place].  Retired from [employer] and [employer].  Goes on mission trips to [place], gets regular exercise.   Family History  Problem Relation Age of Onset  . Hypertension Mother   . Macular degeneration Mother   . Kidney disease Mother   . Hypertension Father   . Melanoma Sister   . Stroke      uncle  . Prostate cancer      uncle  . Colon cancer      grandmother/uncle  . Kidney disease      grandmother  . Liver cancer      uncle (mets)  . Heart disease      aunt/uncle  . Lung cancer      uncle  . Stomach cancer Cousin   . Heart disease Cousin   . Colon cancer Paternal Grandmother   . Colon cancer Paternal Uncle      Review of Systems  All other systems reviewed and are negative.      Objective:   Physical Exam  Constitutional: He is oriented to person, place, and time. He appears well-developed and well-nourished. No distress.  HENT:  Head: Normocephalic and atraumatic.  Right Ear: External ear normal.  Left Ear: External ear normal.  Nose: Nose normal.  Mouth/Throat: Oropharynx is clear and moist. No oropharyngeal exudate.  Eyes: Conjunctivae and EOM are normal. Pupils are equal, round, and reactive to light. Right eye exhibits no discharge. Left eye exhibits no discharge. No scleral icterus.  Neck: Normal range of motion. Neck supple. No JVD present. No tracheal deviation present. No thyromegaly present.  Cardiovascular: Normal rate, regular rhythm, normal heart sounds and intact distal pulses.  Exam reveals no gallop and no friction rub.   No murmur heard. Pulmonary/Chest: Effort normal and breath sounds normal. No stridor. No respiratory distress. He has no wheezes. He has no rales. He exhibits no tenderness.  Abdominal: Soft. Bowel sounds are normal. He exhibits no distension and no mass. There is no tenderness. There is no rebound and no guarding.  Musculoskeletal: Normal range of motion. He exhibits no edema or tenderness.  Lymphadenopathy:     He has no cervical adenopathy.  Neurological: He is alert and oriented to person, place, and time. He has normal reflexes. No cranial nerve deficit. He exhibits normal muscle tone. Coordination normal.  Skin: Skin is warm. No rash noted. He is not diaphoretic. No erythema. No pallor.  Psychiatric: He has a normal mood and affect. His behavior is normal. Judgment and thought content normal.  Vitals reviewed.         Assessment & Plan:  General medical exam - Plan: CBC with Differential/Platelet, COMPLETE METABOLIC PANEL WITH GFR, Lipid panel, PSA  Prostate cancer screening - Plan: PSA  Benign essential HTN - Plan: CBC with Differential/Platelet, COMPLETE METABOLIC PANEL WITH GFR, Lipid panel  Right carotid bruit - Plan: US Carotid Duplex Bilateral  Patient's physical exam is normal. I will check a CBC, CMP, fasting lipid panel, and a PSA. Patient will have his prostate checked at his urologist appointment. Immunizations are up-to-date. I will schedule the patient for carotid Doppler. He would like to try to lose 10-15 pounds straight address his elevated blood pressure. Start aspirin 81 mg a day for right carotid bruit

## 2016-11-14 ENCOUNTER — Encounter: Payer: Self-pay | Admitting: Family Medicine

## 2016-11-17 DIAGNOSIS — D1801 Hemangioma of skin and subcutaneous tissue: Secondary | ICD-10-CM | POA: Diagnosis not present

## 2016-11-17 DIAGNOSIS — L821 Other seborrheic keratosis: Secondary | ICD-10-CM | POA: Diagnosis not present

## 2016-11-17 DIAGNOSIS — L853 Xerosis cutis: Secondary | ICD-10-CM | POA: Diagnosis not present

## 2016-11-17 DIAGNOSIS — L918 Other hypertrophic disorders of the skin: Secondary | ICD-10-CM | POA: Diagnosis not present

## 2016-11-17 DIAGNOSIS — L814 Other melanin hyperpigmentation: Secondary | ICD-10-CM | POA: Diagnosis not present

## 2016-11-17 DIAGNOSIS — D225 Melanocytic nevi of trunk: Secondary | ICD-10-CM | POA: Diagnosis not present

## 2016-11-17 DIAGNOSIS — L570 Actinic keratosis: Secondary | ICD-10-CM | POA: Diagnosis not present

## 2016-11-17 DIAGNOSIS — L308 Other specified dermatitis: Secondary | ICD-10-CM | POA: Diagnosis not present

## 2016-11-23 ENCOUNTER — Other Ambulatory Visit: Payer: Medicare Other

## 2016-11-27 ENCOUNTER — Ambulatory Visit
Admission: RE | Admit: 2016-11-27 | Discharge: 2016-11-27 | Disposition: A | Discharge status: Home / Self Care | Payer: Medicare Other | Source: Ambulatory Visit | Attending: Family Medicine | Admitting: Family Medicine

## 2016-11-27 DIAGNOSIS — R0989 Other specified symptoms and signs involving the circulatory and respiratory systems: Secondary | ICD-10-CM

## 2016-11-27 DIAGNOSIS — I6523 Occlusion and stenosis of bilateral carotid arteries: Secondary | ICD-10-CM | POA: Diagnosis not present

## 2016-11-29 ENCOUNTER — Encounter: Payer: Self-pay | Admitting: Family Medicine

## 2016-11-29 DIAGNOSIS — R3912 Poor urinary stream: Secondary | ICD-10-CM | POA: Diagnosis not present

## 2016-11-30 ENCOUNTER — Ambulatory Visit (INDEPENDENT_AMBULATORY_CARE_PROVIDER_SITE_OTHER): Payer: Medicare Other | Admitting: Podiatry

## 2016-11-30 ENCOUNTER — Encounter: Payer: Self-pay | Admitting: Podiatry

## 2016-11-30 DIAGNOSIS — M722 Plantar fascial fibromatosis: Secondary | ICD-10-CM

## 2016-11-30 NOTE — Progress Notes (Signed)
He presents today for follow-up of his plantar fibroma left foot. States that hurts when available on it all day but otherwise seems to be doing very good. Feels much better than it did prior to surgery.  Objective: Vital signs are stable alert and oriented 3 mild tenderness on palpation of plantar scar but overall it seems to be doing very well. No open lesions no open wounds no ulcerations.  Assessment: Well-healing surgical foot left.  Plan: I feel that we need to get into a pair of orthotics. He was scanned today for some orthotics will follow up with him in the near future.

## 2016-12-21 ENCOUNTER — Encounter: Payer: Self-pay | Admitting: Podiatry

## 2016-12-21 ENCOUNTER — Ambulatory Visit: Payer: Medicare Other | Admitting: Podiatry

## 2016-12-21 ENCOUNTER — Ambulatory Visit (INDEPENDENT_AMBULATORY_CARE_PROVIDER_SITE_OTHER): Payer: Medicare Other | Admitting: Podiatry

## 2016-12-21 ENCOUNTER — Other Ambulatory Visit: Payer: Medicare Other

## 2016-12-21 DIAGNOSIS — M722 Plantar fascial fibromatosis: Secondary | ICD-10-CM

## 2016-12-21 NOTE — Progress Notes (Signed)
He presents today status post excision plantar fibroma 09/08/2016. He states his a little sore and swells occasionally but all in all is doing very well.  Objective: Vital signs are stable he is alert and oriented 3. Pulses are palpable. Neurologic systems intact. Deep tendon reflexes are intact. Muscle strength is 5 over 5 dorsiflexion plantar flexors and inverters everters all his musculatures intact. Scar tissue appears to be healing very well.  Assessment: Well-healing surgical foot.  Plan: His orthotics were dispensed today and I'll follow-up with him on an as-needed basis.

## 2017-01-07 ENCOUNTER — Other Ambulatory Visit: Payer: Self-pay | Admitting: Family Medicine

## 2017-01-15 DIAGNOSIS — H2513 Age-related nuclear cataract, bilateral: Secondary | ICD-10-CM | POA: Diagnosis not present

## 2017-01-15 DIAGNOSIS — H43812 Vitreous degeneration, left eye: Secondary | ICD-10-CM | POA: Diagnosis not present

## 2017-01-15 DIAGNOSIS — H524 Presbyopia: Secondary | ICD-10-CM | POA: Diagnosis not present

## 2017-01-18 ENCOUNTER — Ambulatory Visit: Payer: Medicare Other | Admitting: Podiatry

## 2017-02-01 ENCOUNTER — Ambulatory Visit (INDEPENDENT_AMBULATORY_CARE_PROVIDER_SITE_OTHER): Payer: Medicare Other | Admitting: Podiatry

## 2017-02-01 ENCOUNTER — Encounter: Payer: Self-pay | Admitting: Podiatry

## 2017-02-01 DIAGNOSIS — M722 Plantar fascial fibromatosis: Secondary | ICD-10-CM | POA: Diagnosis not present

## 2017-02-01 NOTE — Progress Notes (Signed)
He presents today for follow-up of his plantar fibroma. He states that he has its moments but all in all is doing much better surgical excision of plantar fibroma 09/08/2016.  Objective: No erythema edema cellulitis drainage or odor and is on a hill 100% no recurrence fibromas are present.  Assessment: 1 nonsurgical foot left.  Plan: Follow up with him on an as-needed basis.

## 2017-09-11 ENCOUNTER — Ambulatory Visit (INDEPENDENT_AMBULATORY_CARE_PROVIDER_SITE_OTHER): Payer: Medicare Other | Admitting: Podiatry

## 2017-09-11 ENCOUNTER — Telehealth: Payer: Self-pay | Admitting: *Deleted

## 2017-09-11 DIAGNOSIS — M7671 Peroneal tendinitis, right leg: Secondary | ICD-10-CM

## 2017-09-11 NOTE — Progress Notes (Signed)
He presents today for chief complaint of pain to the lateral aspect of the right foot.  He states that his continues to get larger and bother me as he refers to the fifth metatarsal base.  He states that it hurts right here as well as he points to the fifth metatarsal base and peroneal tendons.  Objective: Vital signs are stable he is alert and oriented x3.  Pulses are palpable.  He has pain on palpation of fifth metatarsal base with overlying mild erythema.  Peroneal tendons are also involved with erythema and tenderness.  Assessment: Cannot rule out bony tumor nor can I rule out a peroneal tendon tear with surrounding inflammation.  Plan: At this point due to failure of conservative therapy I feel that is necessary for MRI to establish a tear of the peroneal tendon and surgical reconstruction.

## 2017-09-11 NOTE — Telephone Encounter (Signed)
-----  Message from [place] sent at 09/11/2017  8:43 AM EST ----- Regarding: MRI MRI right rearfoot - 5th met base pain and peroneal tendon pain, possible tear right

## 2017-09-11 NOTE — Telephone Encounter (Signed)
Orders faxed to [facility]. 

## 2017-09-19 ENCOUNTER — Ambulatory Visit
Admission: RE | Admit: 2017-09-19 | Discharge: 2017-09-19 | Disposition: A | Discharge status: Home / Self Care | Payer: Medicare Other | Source: Ambulatory Visit | Attending: Podiatry | Admitting: Podiatry

## 2017-09-19 DIAGNOSIS — M7671 Peroneal tendinitis, right leg: Secondary | ICD-10-CM

## 2017-09-19 DIAGNOSIS — M25471 Effusion, right ankle: Secondary | ICD-10-CM | POA: Diagnosis not present

## 2017-09-20 ENCOUNTER — Telehealth: Payer: Self-pay | Admitting: *Deleted

## 2017-09-20 NOTE — Telephone Encounter (Signed)
I told pt Happy Birthday and that Dr. Milinda Pointer had reviewed the MRI and request copy of MRI disc be sent to a radiology specialist for indepth review for treatment planning. Pt states understanding. Mailed disc copy MRI to [facility].

## 2017-09-20 NOTE — Telephone Encounter (Signed)
-----  Message from [place] sent at 09/11/2017  8:43 AM EST ----- Regarding: MRI MRI right rearfoot - 5th met base pain and peroneal tendon pain, possible tear right

## 2017-10-01 ENCOUNTER — Encounter: Payer: Self-pay | Admitting: Podiatry

## 2017-10-02 NOTE — Progress Notes (Signed)
Left vm to schedule apt 10/02/17 @ 922am CS

## 2017-10-09 ENCOUNTER — Encounter: Payer: Self-pay | Admitting: Podiatry

## 2017-10-09 ENCOUNTER — Ambulatory Visit (INDEPENDENT_AMBULATORY_CARE_PROVIDER_SITE_OTHER): Payer: Medicare Other | Admitting: Podiatry

## 2017-10-09 DIAGNOSIS — M722 Plantar fascial fibromatosis: Secondary | ICD-10-CM

## 2017-10-09 MED ORDER — METHYLPREDNISOLONE 4 MG PO TBPK: ORAL_TABLET | ORAL | 0 refills | Status: DC

## 2017-10-09 NOTE — Progress Notes (Signed)
He presents today for a follow-up of his pain to the fifth metatarsal base of the right foot.  He states that really has not improved at all.  Objective: Vital signs are stable alert and oriented x3.  Pulses are palpable.  Neurologic sensorium is intact deep tendon reflexes are intact.  MRI read does demonstrate acute on chronic plantar fasciitis which was not demonstrated previously.  It also demonstrates hypertrophy of the fifth metatarsal base with soft tissue swelling and edema.  There is also a tear of the [place] brevis tendon around the lateral malleolus extending to the fifth met base.  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  Plan: Discussed etiology pathology conservative versus surgical therapies.  Started him on a Medrol Dosepak basement a plantar fascial brace and a night splint.  Discussed appropriate shoe gear stretching exercises ice therapy and shoe gear modification.  After sterile Betadine skin prep and verbal consent we injected his right heel with 20 mg Kenalog 5 mg Marcaine to the point of maximal tenderness medial approach.  He tolerated procedure well without complications and will follow up with him in 1 month.

## 2017-10-29 ENCOUNTER — Encounter: Payer: Self-pay | Admitting: Family Medicine

## 2017-10-29 ENCOUNTER — Ambulatory Visit (INDEPENDENT_AMBULATORY_CARE_PROVIDER_SITE_OTHER): Payer: Medicare Other | Admitting: Family Medicine

## 2017-10-29 ENCOUNTER — Ambulatory Visit
Admission: RE | Admit: 2017-10-29 | Discharge: 2017-10-29 | Disposition: A | Discharge status: Home / Self Care | Payer: Medicare Other | Source: Ambulatory Visit | Attending: Family Medicine | Admitting: Family Medicine

## 2017-10-29 VITALS — BP 158/92 | HR 88 | Temp 98.7°F | Resp 16 | Ht 69.5 in | Wt 222.0 lb

## 2017-10-29 DIAGNOSIS — R61 Generalized hyperhidrosis: Secondary | ICD-10-CM

## 2017-10-29 DIAGNOSIS — R0609 Other forms of dyspnea: Secondary | ICD-10-CM | POA: Diagnosis not present

## 2017-10-29 DIAGNOSIS — R0602 Shortness of breath: Secondary | ICD-10-CM | POA: Diagnosis not present

## 2017-10-29 DIAGNOSIS — R079 Chest pain, unspecified: Secondary | ICD-10-CM

## 2017-10-29 DIAGNOSIS — I10 Essential (primary) hypertension: Secondary | ICD-10-CM

## 2017-10-29 MED ORDER — HYDROCHLOROTHIAZIDE 25 MG PO TABS: 25.0000 mg | ORAL_TABLET | Freq: Every day | ORAL | 3 refills | Status: DC

## 2017-10-29 NOTE — Progress Notes (Signed)
Subjective:    Patient ID: Robert Page, male    DOB: Nov 02, 1945, 72 y.o.   MRN: [id number]  HPI Patient presents today with atypical left chest wall pain.  Pain is located at the upper left sternal border.  It is been there for 2 weeks off and on.  There is no exacerbating or alleviating factors.  In fact he feels some tenderness and pain there now however I am able to reproduce the pain by palpation in the upper left sternal border.  He is tender in that area.  The pain began after he was hanging from his left arm building a barn.  He believes he may have strained a chest wall muscle while doing this.  He denies any chest pain with activity.  He denies any classic angina.  He had a negative stress test in 2014.  His EKG is abnormal on a chronic basis with ST downsloping depression in the inferior and lateral leads.  This is been present all the way back to 2014 and shows no significant change today.  He also reports however dyspnea on exertion.  This is a new finding.  He also complains of night sweats.  He states that he will wake up soaking wet for no reason.  He denies any fevers or chills.  He does report fatigue. EKG today shows nonspecific downsloping ST depression in leads II, 3, aVF, and in the precordial leads V4, V5 and V6.  These are chronic findings. Past Medical History:  Diagnosis Date  . Adenomatous polyp   . Allergy   . Atrial tachycardia ([place])   . Diverticulosis   . Dupuytren's disease    palms and soles  . GERD (gastroesophageal reflux disease)   . Hypertension    Past Surgical History:  Procedure Laterality Date  . BACK SURGERY     12/2013  . COLECTOMY  2010   for diverticulitis  . LASIK  2002   [place]   Current Outpatient Medications on File Prior to Visit  Medication Sig Dispense Refill  . aspirin EC 81 MG tablet Take 81 mg by mouth daily.    . famotidine (PEPCID) 20 MG tablet Take 20 mg by mouth as needed.     . fexofenadine (ALLEGRA) 180 MG tablet 180 mg daily.      Marland Kitchen losartan (COZAAR) 100 MG tablet TAKE 1 TABLET(100 MG) BY MOUTH DAILY 90 tablet 3  . omeprazole (PRILOSEC) 20 MG capsule Take 20 mg by mouth daily.     No current facility-administered medications on file prior to visit.    Allergies  Allergen Reactions  . Ibuprofen     REACTION: stomach cramps  . Sulfonamide Derivatives     REACTION: itch   Social History   Socioeconomic History  . Marital status: Married    Spouse name: Tajuan Dufault  . Number of children: Not on file  . Years of education: Not on file  . Highest education level: Not on file  Social Needs  . Financial resource strain: Not on file  . Food insecurity - worry: Not on file  . Food insecurity - inability: Not on file  . Transportation needs - medical: Not on file  . Transportation needs - non-medical: Not on file  Occupational History  . Occupation: retired  Tobacco Use  . Smoking status: Light Tobacco Smoker    Types: Cigars  . Smokeless tobacco: Never Used  . Tobacco comment: Pt states smokes occasional cigar.  This is typically <  1 time per month  Substance and Sexual Activity  . Alcohol use: No    Alcohol/week: 0.0 oz  . Drug use: No  . Sexual activity: Not on file  Other Topics Concern  . Not on file  Social History Narrative   Pt lives in [place].  Retired from [employer] and [employer].  Goes on mission trips to [place], gets regular exercise.      Review of Systems  All other systems reviewed and are negative.      Objective:   Physical Exam  Constitutional: He appears well-developed and well-nourished. No distress.  Neck: Neck supple. No JVD present.  Cardiovascular: Normal rate, regular rhythm, normal heart sounds and intact distal pulses. Exam reveals no gallop and no friction rub.  No murmur heard. Pulmonary/Chest: Effort normal and breath sounds normal. No respiratory distress. He has no wheezes. He has no rales. He exhibits tenderness. Left breast exhibits tenderness.     Abdominal: Soft. Bowel sounds are normal. He exhibits no distension and no mass. There is no tenderness. There is no rebound and no guarding.  Musculoskeletal: He exhibits tenderness. He exhibits no edema.  Lymphadenopathy:    He has no cervical adenopathy.  Skin: He is not diaphoretic.  Vitals reviewed.         Assessment & Plan:  Chest pain, unspecified type - Plan: EKG 12-Lead, DG Chest 2 View, CBC with Differential/Platelet, COMPLETE METABOLIC PANEL WITH GFR, TSH, Sedimentation rate, Testosterone, Ambulatory referral to Cardiology  Night sweats - Plan: DG Chest 2 View, CBC with Differential/Platelet, COMPLETE METABOLIC PANEL WITH GFR, TSH, Sedimentation rate, Testosterone  Benign essential HTN - Plan: hydrochlorothiazide (HYDRODIURIL) 25 MG tablet  DOE (dyspnea on exertion) - Plan: Ambulatory referral to Cardiology  Patient's chest pain is atypical and unrelated to exertion and reproducible by palpation in the left upper sternal border.  I suspect costochondritis versus a chest wall muscle strain.  However given his age and dyspnea on exertion, I believe a cardiology evaluation is warranted.  If the patient's pain returns or intensifies, I instructed the patient to go to the hospital immediately.  Given his night sweats, I will complete a metabolic workup including a CBC, CMP, TSH, sed rate, and testosterone level.  I will treat his hypertension with 25 mg of hydrochlorothiazide a day in addition to losartan.  Given his history of forming in his exposure to birds and mold, I will also obtain a chest x-ray

## 2017-10-30 ENCOUNTER — Encounter: Payer: Self-pay | Admitting: Family Medicine

## 2017-10-30 LAB — CBC WITH DIFFERENTIAL/PLATELET
BASOS ABS: 72 {cells}/uL (ref 0–200)
Basophils Relative: 0.9 %
EOS PCT: 2.1 %
Eosinophils Absolute: 168 cells/uL (ref 15–500)
HEMATOCRIT: 47.3 % (ref 38.5–50.0)
HEMOGLOBIN: 16 g/dL (ref 13.2–17.1)
LYMPHS ABS: 1600 {cells}/uL (ref 850–3900)
MCH: 29.3 pg (ref 27.0–33.0)
MCHC: 33.8 g/dL (ref 32.0–36.0)
MCV: 86.6 fL (ref 80.0–100.0)
MONOS PCT: 8.7 %
MPV: 10.8 fL (ref 7.5–12.5)
NEUTROS ABS: 5464 {cells}/uL (ref 1500–7800)
Neutrophils Relative %: 68.3 %
Platelets: 269 10*3/uL (ref 140–400)
RBC: 5.46 10*6/uL (ref 4.20–5.80)
RDW: 12.9 % (ref 11.0–15.0)
Total Lymphocyte: 20 %
WBC mixed population: 696 cells/uL (ref 200–950)
WBC: 8 10*3/uL (ref 3.8–10.8)

## 2017-10-30 LAB — COMPLETE METABOLIC PANEL WITH GFR
AG Ratio: 1.9 (calc) (ref 1.0–2.5)
ALBUMIN MSPROF: 4.6 g/dL (ref 3.6–5.1)
ALT: 17 U/L (ref 9–46)
AST: 15 U/L (ref 10–35)
Alkaline phosphatase (APISO): 72 U/L (ref 40–115)
BUN: 19 mg/dL (ref 7–25)
CALCIUM: 10 mg/dL (ref 8.6–10.3)
CO2: 31 mmol/L (ref 20–32)
CREATININE: 1.1 mg/dL (ref 0.70–1.18)
Chloride: 104 mmol/L (ref 98–110)
GFR, EST NON AFRICAN AMERICAN: 68 mL/min/{1.73_m2} (ref >=60)
GFR, Est African American: 78 mL/min/{1.73_m2} (ref >=60)
GLUCOSE: 91 mg/dL (ref 65–99)
Globulin: 2.4 g/dL (calc) (ref 1.9–3.7)
Potassium: 5.2 mmol/L (ref 3.5–5.3)
Sodium: 142 mmol/L (ref 135–146)
Total Bilirubin: 0.4 mg/dL (ref 0.2–1.2)
Total Protein: 7 g/dL (ref 6.1–8.1)

## 2017-10-30 LAB — TSH: TSH: 1.11 m[IU]/L (ref 0.40–4.50)

## 2017-10-30 LAB — SEDIMENTATION RATE: SED RATE: 6 mm/h (ref 0–20)

## 2017-10-30 LAB — TESTOSTERONE: Testosterone: 313 ng/dL (ref 250–827)

## 2017-11-01 ENCOUNTER — Ambulatory Visit (INDEPENDENT_AMBULATORY_CARE_PROVIDER_SITE_OTHER): Payer: Medicare Other | Admitting: Podiatry

## 2017-11-01 ENCOUNTER — Encounter: Payer: Self-pay | Admitting: Podiatry

## 2017-11-01 DIAGNOSIS — M722 Plantar fascial fibromatosis: Secondary | ICD-10-CM

## 2017-11-01 NOTE — Progress Notes (Signed)
He presents today stating that the lateral aspect of the fifth metatarsal base is doing much better however he has pain right here as he points to the plantar fibroma distal portion of the medial longitudinal arch.  Objective: Vital signs stable alert and oriented x3.  Pulses were palp.  Neurologic sensorium is intact.  A 1 cm x 1 cm nodular nonpulsatile mass along the medial band of the plantar fascia distally consistent with plantar fibroma is painful on palpation.  Assessment: Fibroma right foot resolving plantar fasciitis right heel.  Plan: Discussed etiology pathology conservative therapies at this point time after sterile Betadine skin prep I injected 20 mg Kenalog 5 mg Marcaine point maximal tenderness within the lesion itself.  Follow-up with him in about 6 weeks.

## 2017-11-06 ENCOUNTER — Ambulatory Visit (INDEPENDENT_AMBULATORY_CARE_PROVIDER_SITE_OTHER): Payer: Medicare Other | Admitting: Family Medicine

## 2017-11-06 ENCOUNTER — Other Ambulatory Visit: Payer: Self-pay

## 2017-11-06 ENCOUNTER — Encounter: Payer: Self-pay | Admitting: Family Medicine

## 2017-11-06 VITALS — BP 142/78 | HR 93 | Temp 97.9°F | Resp 16 | Wt 219.0 lb

## 2017-11-06 DIAGNOSIS — J209 Acute bronchitis, unspecified: Secondary | ICD-10-CM

## 2017-11-06 MED ORDER — HYDROCODONE-HOMATROPINE 5-1.5 MG/5ML PO SYRP: 5.0000 mL | ORAL_SOLUTION | Freq: Three times a day (TID) | ORAL | 0 refills | Status: DC | PRN

## 2017-11-06 MED ORDER — AZITHROMYCIN 250 MG PO TABS: ORAL_TABLET | ORAL | 0 refills | Status: DC

## 2017-11-06 NOTE — Progress Notes (Signed)
Subjective:    Patient ID: Robert Page, male    DOB: July 30, 1946, 72 y.o.   MRN: [id number]  HPI Symptoms began 1 week ago with cough and fever.  Cough steadily worsened.  No with yellow green sputum, increasing sob, and faint expiratory wheezing.  Denies pleurisy.  Does have rales throughout and exp wheezing.   Past Medical History:  Diagnosis Date  . Adenomatous polyp   . Allergy   . Atrial tachycardia ([place])   . Diverticulosis   . Dupuytren's disease    palms and soles  . GERD (gastroesophageal reflux disease)   . Hypertension    Past Surgical History:  Procedure Laterality Date  . BACK SURGERY     12/2013  . COLECTOMY  2010   for diverticulitis  . LASIK  2002   [place]   Current Outpatient Medications on File Prior to Visit  Medication Sig Dispense Refill  . aspirin EC 81 MG tablet Take 81 mg by mouth daily.    . famotidine (PEPCID) 20 MG tablet Take 20 mg by mouth as needed.     . fexofenadine (ALLEGRA) 180 MG tablet 180 mg daily.     . hydrochlorothiazide (HYDRODIURIL) 25 MG tablet Take 1 tablet (25 mg total) by mouth daily. 90 tablet 3  . losartan (COZAAR) 100 MG tablet TAKE 1 TABLET(100 MG) BY MOUTH DAILY 90 tablet 3  . omeprazole (PRILOSEC) 20 MG capsule Take 20 mg by mouth daily.     No current facility-administered medications on file prior to visit.    Allergies  Allergen Reactions  . Ibuprofen     REACTION: stomach cramps  . Sulfonamide Derivatives     REACTION: itch   Social History   Socioeconomic History  . Marital status: Married    Spouse name: Jahkai Yandell  . Number of children: Not on file  . Years of education: Not on file  . Highest education level: Not on file  Social Needs  . Financial resource strain: Not on file  . Food insecurity - worry: Not on file  . Food insecurity - inability: Not on file  . Transportation needs - medical: Not on file  . Transportation needs - non-medical: Not on file  Occupational History  . Occupation:  retired  Tobacco Use  . Smoking status: Light Tobacco Smoker    Types: Cigars  . Smokeless tobacco: Never Used  . Tobacco comment: Pt states smokes occasional cigar.  This is typically < 1 time per month  Substance and Sexual Activity  . Alcohol use: No    Alcohol/week: 0.0 oz  . Drug use: No  . Sexual activity: Not on file  Other Topics Concern  . Not on file  Social History Narrative   Pt lives in [place].  Retired from [employer] and [employer].  Goes on mission trips to [place], gets regular exercise.      Review of Systems  All other systems reviewed and are negative.      Objective:   Physical Exam  Constitutional: He appears well-developed and well-nourished. No distress.  HENT:  Right Ear: External ear normal.  Left Ear: External ear normal.  Nose: Nose normal.  Mouth/Throat: Oropharynx is clear and moist. No oropharyngeal exudate.  Eyes: Conjunctivae are normal.  Neck: Neck supple.  Cardiovascular: Normal rate, regular rhythm and normal heart sounds.  Pulmonary/Chest: Effort normal. He has wheezes. He has rales.  Lymphadenopathy:    He has no cervical adenopathy.  Skin: He is  not diaphoretic.  Vitals reviewed.  Coughing non stop       Assessment & Plan:  Acute bronchitis, unspecified organism - Plan: HYDROcodone-homatropine (HYCODAN) 5-1.5 MG/5ML syrup, azithromycin (ZITHROMAX) 250 MG tablet  Begin zpack.  USe mucinex dm as mucolytic.  Take hycodan 1 tsp po q 6 hrs prn cough.  Reassess in 1 week.

## 2017-11-09 DIAGNOSIS — R3912 Poor urinary stream: Secondary | ICD-10-CM | POA: Diagnosis not present

## 2017-11-09 DIAGNOSIS — Z125 Encounter for screening for malignant neoplasm of prostate: Secondary | ICD-10-CM | POA: Diagnosis not present

## 2017-11-09 DIAGNOSIS — R948 Abnormal results of function studies of other organs and systems: Secondary | ICD-10-CM | POA: Diagnosis not present

## 2017-11-09 DIAGNOSIS — N401 Enlarged prostate with lower urinary tract symptoms: Secondary | ICD-10-CM | POA: Diagnosis not present

## 2017-11-09 DIAGNOSIS — N5201 Erectile dysfunction due to arterial insufficiency: Secondary | ICD-10-CM | POA: Diagnosis not present

## 2017-11-19 ENCOUNTER — Ambulatory Visit (INDEPENDENT_AMBULATORY_CARE_PROVIDER_SITE_OTHER): Payer: Medicare Other | Admitting: Family Medicine

## 2017-11-19 ENCOUNTER — Encounter: Payer: Self-pay | Admitting: Family Medicine

## 2017-11-19 VITALS — BP 140/78 | HR 88 | Temp 97.8°F | Resp 16 | Ht 69.5 in | Wt 223.0 lb

## 2017-11-19 DIAGNOSIS — Z0000 Encounter for general adult medical examination without abnormal findings: Secondary | ICD-10-CM | POA: Diagnosis not present

## 2017-11-19 DIAGNOSIS — I10 Essential (primary) hypertension: Secondary | ICD-10-CM

## 2017-11-19 LAB — LIPID PANEL
CHOLESTEROL: 167 mg/dL (ref <200)
HDL: 52 mg/dL (ref >40)
LDL CHOLESTEROL (CALC): 100 mg/dL — AB
Non-HDL Cholesterol (Calc): 115 mg/dL (calc) (ref <130)
TRIGLYCERIDES: 63 mg/dL (ref <150)
Total CHOL/HDL Ratio: 3.2 (calc) (ref <5.0)

## 2017-11-19 NOTE — Progress Notes (Signed)
Subjective:    Patient ID: Robert Page, male    DOB: 30-Sep-1945, 72 y.o.   MRN: [id number]  HPI Here for CPE.  Continues to have left-sided chest pain.  However it is movement related and seems to be either from cervical radiculopathy or possibly a muscle in the chest wall.  He continues to endorse dyspnea on exertion and therefore has a cardiology appointment scheduled for later this month however I have a low index of suspicion that this has anything to do with his heart.  I believe is most likely musculoskeletal. His Last colonoscopy 2014, significant for polyps, next colonoscopy is due this year.  Patient has had pneumovax in 2014 and prevnar 13 in 2018.  Patient has prostate cancer screening at his urologist.  Immunization History  Administered Date(s) Administered  . H1N1 09/23/2008  . Influenza Split 06/04/2014  . Influenza,inj,Quad PF,6+ Mos 08/08/2013, 05/17/2015, 10/17/2016  . Pneumococcal Conjugate-13 10/17/2016  . Pneumococcal Polysaccharide-23 03/11/2013  . Td 03/04/2004  . Tdap 10/17/2016    Past Medical History:  Diagnosis Date  . Adenomatous polyp   . Allergy   . Atrial tachycardia ([place])   . Diverticulosis   . Dupuytren's disease    palms and soles  . GERD (gastroesophageal reflux disease)   . Hypertension    Past Surgical History:  Procedure Laterality Date  . BACK SURGERY     12/2013  . COLECTOMY  2010   for diverticulitis  . LASIK  2002   [place]il   Current Outpatient Medications on File Prior to Visit  Medication Sig Dispense Refill  . aspirin EC 81 MG tablet Take 81 mg by mouth daily.    Marland Kitchen azithromycin (ZITHROMAX) 250 MG tablet 2 tabs poqday1, 1 tab poqday 2-5 6 tablet 0  . famotidine (PEPCID) 20 MG tablet Take 20 mg by mouth as needed.     . fexofenadine (ALLEGRA) 180 MG tablet 180 mg daily.     . hydrochlorothiazide (HYDRODIURIL) 25 MG tablet Take 1 tablet (25 mg total) by mouth daily. 90 tablet 3  . HYDROcodone-homatropine (HYCODAN) 5-1.5 MG/5ML  syrup Take 5 mLs by mouth every 8 (eight) hours as needed for cough. 120 mL 0  . losartan (COZAAR) 100 MG tablet TAKE 1 TABLET(100 MG) BY MOUTH DAILY 90 tablet 3  . omeprazole (PRILOSEC) 20 MG capsule Take 20 mg by mouth daily.     No current facility-administered medications on file prior to visit.    Allergies  Allergen Reactions  . Ibuprofen     REACTION: stomach cramps  . Sulfonamide Derivatives     REACTION: itch   Social History   Socioeconomic History  . Marital status: Married    Spouse name: Gustabo Gordillo  . Number of children: Not on file  . Years of education: Not on file  . Highest education level: Not on file  Social Needs  . Financial resource strain: Not on file  . Food insecurity - worry: Not on file  . Food insecurity - inability: Not on file  . Transportation needs - medical: Not on file  . Transportation needs - non-medical: Not on file  Occupational History  . Occupation: retired  Tobacco Use  . Smoking status: Light Tobacco Smoker    Types: Cigars  . Smokeless tobacco: Never Used  . Tobacco comment: Pt states smokes occasional cigar.  This is typically < 1 time per month  Substance and Sexual Activity  . Alcohol use: No    Alcohol/week:  0.0 oz  . Drug use: No  . Sexual activity: Not on file  Other Topics Concern  . Not on file  Social History Narrative   Pt lives in [place].  Retired from [employer] and [employer].  Goes on mission trips to [place], gets regular exercise.   Family History  Problem Relation Age of Onset  . Hypertension Mother   . Macular degeneration Mother   . Kidney disease Mother   . Hypertension Father   . Melanoma Sister   . Stroke Unknown        uncle  . Prostate cancer Unknown        uncle  . Colon cancer Unknown        grandmother/uncle  . Kidney disease Unknown        grandmother  . Liver cancer Unknown        uncle (mets)  . Heart disease Unknown        aunt/uncle  . Lung cancer Unknown        uncle  .  Stomach cancer Cousin   . Heart disease Cousin   . Colon cancer Paternal Grandmother   . Colon cancer Paternal Uncle      Review of Systems  All other systems reviewed and are negative.      Objective:   Physical Exam  Constitutional: He is oriented to person, place, and time. He appears well-developed and well-nourished. No distress.  HENT:  Head: Normocephalic and atraumatic.  Right Ear: External ear normal.  Left Ear: External ear normal.  Nose: Nose normal.  Mouth/Throat: Oropharynx is clear and moist. No oropharyngeal exudate.  Eyes: Conjunctivae and EOM are normal. Pupils are equal, round, and reactive to light. Right eye exhibits no discharge. Left eye exhibits no discharge. No scleral icterus.  Neck: Normal range of motion. Neck supple. No JVD present. No tracheal deviation present. No thyromegaly present.  Cardiovascular: Normal rate, regular rhythm, normal heart sounds and intact distal pulses. Exam reveals no gallop and no friction rub.  No murmur heard. Pulmonary/Chest: Effort normal and breath sounds normal. No stridor. No respiratory distress. He has no wheezes. He has no rales. He exhibits no tenderness.  Abdominal: Soft. Bowel sounds are normal. He exhibits no distension and no mass. There is no tenderness. There is no rebound and no guarding.  Musculoskeletal: Normal range of motion. He exhibits no edema or tenderness.  Lymphadenopathy:    He has no cervical adenopathy.  Neurological: He is alert and oriented to person, place, and time. He has normal reflexes. No cranial nerve deficit. He exhibits normal muscle tone. Coordination normal.  Skin: Skin is warm. No rash noted. He is not diaphoretic. No erythema. No pallor.  Psychiatric: He has a normal mood and affect. His behavior is normal. Judgment and thought content normal.  Vitals reviewed.         Assessment & Plan:  Benign essential HTN - Plan: Lipid panel  General medical exam  Patient's physical  exam is normal.  Patient already had a CBC, CMP checked in February that were normal.  His PSA was recently checked by his urologist.  I will obtain a fasting lipid panel today.  Immunizations are up-to-date.  Patient's cancer screening is up-to-date except for his colonoscopy which is due this year.  Patient states that he will schedule this.  He declines hepatitis C screening

## 2017-11-20 ENCOUNTER — Encounter: Payer: Self-pay | Admitting: Family Medicine

## 2017-11-27 ENCOUNTER — Encounter: Payer: Self-pay | Admitting: Cardiology

## 2017-11-27 ENCOUNTER — Ambulatory Visit (INDEPENDENT_AMBULATORY_CARE_PROVIDER_SITE_OTHER): Payer: Medicare Other | Admitting: Cardiology

## 2017-11-27 VITALS — BP 142/98 | HR 62 | Ht 69.5 in | Wt 222.0 lb

## 2017-11-27 DIAGNOSIS — R0789 Other chest pain: Secondary | ICD-10-CM | POA: Insufficient documentation

## 2017-11-27 DIAGNOSIS — R0609 Other forms of dyspnea: Secondary | ICD-10-CM | POA: Diagnosis not present

## 2017-11-27 DIAGNOSIS — I10 Essential (primary) hypertension: Secondary | ICD-10-CM | POA: Diagnosis not present

## 2017-11-27 DIAGNOSIS — I471 Supraventricular tachycardia: Secondary | ICD-10-CM | POA: Diagnosis not present

## 2017-11-27 MED ORDER — AMLODIPINE BESYLATE 5 MG PO TABS: 5.0000 mg | ORAL_TABLET | Freq: Every day | ORAL | 1 refills | Status: DC

## 2017-11-27 NOTE — Progress Notes (Addendum)
Cardiology Office Note:    Date:  11/27/2017   ID:  Robert Page, DOB 05/25/1946, MRN [id number]  PCP:  Susy Frizzle, MD  Cardiologist:  Larae Grooms, MD- [facility]   Referring MD: Susy Frizzle, MD   Chief Complaint  Patient presents with  . Chest Pain  . Shortness of Breath    History of Present Illness:    Robert Page is a 72 y.o. male who is being seen today for the evaluation of dyspnea on exertion at the request of Susy Frizzle, MD.   The patient has a past medical history significant for GERD, hypertension, atrial tachycardia.  Upon review of primary care provider notes, the patient has been complaining of left-sided chest pain that is movement related and suspected to be from either cervical radiculopathy or possibly a muscle in the chest wall.  The patient has been complaining of dyspnea on exertion.  Dr. [name] has a low index of suspicion for cardiac origin.  The patient reports that he has always had indigestion. Currently he is having dull pain over left clavicle that runs to his left neck, occurs usually while seated, is present now. Movement of his arm worsens the pain and he is mildly tender to palpation of the clavicle area. He denies recent injury. 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 He complaints of a decrease in energy and mild DOE with walking a distance, walking up hill and up stairs progressively worsening over the last year. He has had night sweats for about 6 months. No chest discomfort or dyspnea when he wakes up wet with sweat. No palpitations, lightheadedness, edema, syncope.   The patient saw Dr. Rayann Heman in 2014 for nonsustained SVT suspected to be atrial tachycardia.  As of 12/2012 his symptoms had markedly improved.  He was treated briefly with diltiazem but with improvement in his symptoms he stopped that.  An echocardiogram on 10/30/2012 showed normal LV systolic function with a EF  65%, bilateral atrial dilatation.  He also had a low risk Myoview.  He declined consideration of ablation or addition of medications. He has had no significant palpitations since then.   He used to smoke cigars 2-3 per week, but has not had any in 4 years. No alcohol use.   PAD Screen 11/27/2017  Previous PAD dx? No  Previous surgical procedure? No  Pain with walking? No  Feet/toe relief with dangling? No  Painful, non-healing ulcers? No  Extremities discolored? No     Past Medical History:  Diagnosis Date  . Adenomatous polyp   . Allergy   . Atrial tachycardia ([place])   . Diverticulosis   . Dupuytren's disease    palms and soles  . GERD (gastroesophageal reflux disease)   . Hypertension     Past Surgical History:  Procedure Laterality Date  . BACK SURGERY     12/2013  . COLECTOMY  2010   for diverticulitis  . LASIK  2002   Bil    Current Medications: Current Meds  Medication Sig  . aspirin EC 81 MG tablet Take 81 mg by mouth daily.  . famotidine (PEPCID) 20 MG tablet Take 20 mg by mouth as needed.   . fexofenadine (ALLEGRA) 180 MG tablet Take 180 mg by mouth daily.   . hydrochlorothiazide (HYDRODIURIL) 25 MG tablet Take 1 tablet (25 mg total) by mouth daily.  Marland Kitchen losartan (COZAAR) 100 MG tablet TAKE  1 TABLET(100 MG) BY MOUTH DAILY  . omeprazole (PRILOSEC) 20 MG capsule Take 20 mg by mouth daily.     Allergies:   Ibuprofen and Sulfonamide derivatives   Social History   Socioeconomic History  . Marital status: Married    Spouse name: Neizan Debruhl  . Number of children: Not on file  . Years of education: Not on file  . Highest education level: Not on file  Occupational History  . Occupation: retired  Scientific laboratory technician  . Financial resource strain: Not on file  . Food insecurity:    Worry: Not on file    Inability: Not on file  . Transportation needs:    Medical: Not on file    Non-medical: Not on file  Tobacco Use  . Smoking status: Former Smoker    Types:  Cigars    Last attempt to quit: 2015    Years since quitting: 4.2  . Smokeless tobacco: Never Used  . Tobacco comment: Pt states smokes occasional cigar.  This is typically < 1 time per month  Substance and Sexual Activity  . Alcohol use: No    Alcohol/week: 0.0 oz  . Drug use: No  . Sexual activity: Not on file  Lifestyle  . Physical activity:    Days per week: Not on file    Minutes per session: Not on file  . Stress: Not on file  Relationships  . Social connections:    Talks on phone: Not on file    Gets together: Not on file    Attends religious service: Not on file    Active member of club or organization: Not on file    Attends meetings of clubs or organizations: Not on file    Relationship status: Not on file  Other Topics Concern  . Not on file  Social History Narrative   Pt lives in [place].  Retired from [employer] and [employer].  Goes on mission trips to [place], gets regular exercise.     Family History: The patient's family history includes Arthritis in his brother; Colon cancer in his paternal grandmother, paternal uncle, and unknown relative; Dementia in his mother; Heart disease in his cousin and unknown relative; Hypertension in his brother, father, and mother; Kidney disease in his mother and unknown relative; Liver cancer in his unknown relative; Lung cancer in his unknown relative; Macular degeneration in his mother; Melanoma in his sister; Prostate cancer in his unknown relative; Stomach cancer in his cousin; Stroke in his unknown relative. ROS:   Please see the history of present illness.     All other systems reviewed and are negative.  EKGs/Labs/Other Studies Reviewed:    The following studies were reviewed today:  Echocardiogram 10/30/2012 Study Conclusions  - Left ventricle: The cavity size was mildly dilated. Wall thickness was normal. The estimated ejection fraction was 65%. Wall motion was normal; there were no regional wall motion  abnormalities. - Left atrium: The atrium was mildly to moderately dilated. - Right atrium: The atrium was mildly dilated.  Exercise Myoview 10/29/12 Impression Exercise Capacity:  Fair exercise capacity. BP Response:  Hypertensive blood pressure response. Clinical Symptoms:  There is dyspnea. ECG Impression:  Significant ST abnormalities consistent with ischemia. Comparison with Prior Nuclear Study: No images to compare Overall Impression:  Low risk stress nuclear study with significant ST changes in the setting of hypertensive response; however, perfusion is normal. LV Ejection Fraction: 57%.  LV Wall Motion:  NL LV Function; NL Wall Motion  EKG:  EKG is ordered today.  The ekg ordered today demonstrates sinus bradycardia, 57 bpm, with non-specific ST changes.   Recent Labs: 10/29/2017: ALT 17; BUN 19; Creat 1.10; Hemoglobin 16.0; Platelets 269; Potassium 5.2; Sodium 142; TSH 1.11   Recent Lipid Panel    Component Value Date/Time   CHOL 167 11/19/2017 0853   TRIG 63 11/19/2017 0853   HDL 52 11/19/2017 0853   CHOLHDL 3.2 11/19/2017 0853   VLDL 15 11/13/2016 0854   LDLCALC 100 (H) 11/19/2017 0853    Physical Exam:    VS:  BP (!) 142/98   Pulse 62   Ht 5' 9.5" (1.765 m)   Wt 222 lb (100.7 kg)   SpO2 95%   BMI 32.31 kg/m     Wt Readings from Last 3 Encounters:  11/27/17 222 lb (100.7 kg)  11/19/17 223 lb (101.2 kg)  11/06/17 219 lb (99.3 kg)     GEN:  Well nourished, well developed in no acute distress HEENT: Normal NECK: No JVD; No carotid bruits LYMPHATICS: No lymphadenopathy CARDIAC: RRR, no murmurs, rubs, gallops RESPIRATORY:  Clear to auscultation without rales, wheezing or rhonchi  ABDOMEN: Soft, non-tender, non-distended MUSCULOSKELETAL:  No edema; No deformity  SKIN: Warm and dry NEUROLOGIC:  Alert and oriented x 3 PSYCHIATRIC:  Normal affect   ASSESSMENT:    1. DOE (dyspnea on exertion)   2. Chest pain, atypical   3. Essential hypertension   4.  SVT (supraventricular tachycardia) (HCC)    PLAN:    Pt was seen and examined by myself and Dr Irish Lack in clinic today. This patient's case was discussed in depth with Dr. Irish Lack. The plan below was formulated per our discussion.  In order of problems listed above:  Dyspnea on exertion and atypical chest pain: CVD risk factors include HTN and age. His pain is under his clavicle and radiates to his neck and is reproducible with palpation and movement. He has no exertional chest discomfort. He has had some progressive DOE with higher intensity activities over the last year. 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 His BP has been running high and may be contributing to his symptoms. Will assess echocardiogram for LV function, wall motion and valves.   Hypertension: On Losartan 100 mg daily. Hydrochlorothiazide 25 mg was added about 6 weeks ago for elevated BP. BP continues to be elevated.  Will add amlodipine 5 mg daily. Advised on dietary sodium restriction, 2 gm and DASH diet.   History of atrial tachycardia: Seen by Dr. Rayann Heman in 2014.  No recent palpitations.     Medication Adjustments/Labs and Tests Ordered: Current medicines are reviewed at length with the patient today.  Concerns regarding medicines are outlined above. Labs and tests ordered and medication changes are outlined in the patient instructions below:  Patient Instructions  Medication Instructions:  1) START Amlodipine 5mg  once daily  Labwork: None  Testing/Procedures: Your physician has requested that you have an echocardiogram. Echocardiography is a painless test that uses sound waves to create images of your heart. It provides your doctor with information about the size and shape of your heart and how well your heart's chambers and valves are working. This procedure takes approximately one hour. There are no restrictions for this procedure.   Follow-Up: Your physician wants you to follow-up in: 6 months with Dr.  Irish Lack.  You will receive a reminder letter in the mail two months in advance. If you don't receive a letter, please call our  office to schedule the follow-up appointment.   Any Other Special Instructions Will Be Listed Below (If Applicable).   DASH Eating Plan DASH stands for "Dietary Approaches to Stop Hypertension." The DASH eating plan is a healthy eating plan that has been shown to reduce high blood pressure (hypertension). It may also reduce your risk for type 2 diabetes, heart disease, and stroke. The DASH eating plan may also help with weight loss. What are tips for following this plan? General guidelines  Avoid eating more than 2,300 mg (milligrams) of salt (sodium) a day. If you have hypertension, you may need to reduce your sodium intake to 1,500 mg a day.  Limit alcohol intake to no more than 1 drink a day for nonpregnant women and 2 drinks a day for men. One drink equals 12 oz of beer, 5 oz of wine, or 1 oz of hard liquor.  Work with your health care provider to maintain a healthy body weight or to lose weight. Ask what an ideal weight is for you.  Get at least 30 minutes of exercise that causes your heart to beat faster (aerobic exercise) most days of the week. Activities may include walking, swimming, or biking.  Work with your health care provider or diet and nutrition specialist (dietitian) to adjust your eating plan to your individual calorie needs. Reading food labels  Check food labels for the amount of sodium per serving. Choose foods with less than 5 percent of the Daily Value of sodium. Generally, foods with less than 300 mg of sodium per serving fit into this eating plan.  To find whole grains, look for the word "whole" as the first word in the ingredient list. Shopping  Buy products labeled as "low-sodium" or "no salt added."  Buy fresh foods. Avoid canned foods and premade or frozen meals. Cooking  Avoid adding salt when cooking. Use salt-free seasonings  or herbs instead of table salt or sea salt. Check with your health care provider or pharmacist before using salt substitutes.  Do not fry foods. Cook foods using healthy methods such as baking, boiling, grilling, and broiling instead.  Cook with heart-healthy oils, such as olive, canola, soybean, or sunflower oil. Meal planning   Eat a balanced diet that includes: ? 5 or more servings of fruits and vegetables each day. At each meal, try to fill half of your plate with fruits and vegetables. ? Up to 6-8 servings of whole grains each day. ? Less than 6 oz of lean meat, poultry, or fish each day. A 3-oz serving of meat is about the same size as a deck of cards. One egg equals 1 oz. ? 2 servings of low-fat dairy each day. ? A serving of nuts, seeds, or beans 5 times each week. ? Heart-healthy fats. Healthy fats called Omega-3 fatty acids are found in foods such as flaxseeds and coldwater fish, like sardines, salmon, and mackerel.  Limit how much you eat of the following: ? Canned or prepackaged foods. ? Food that is high in trans fat, such as fried foods. ? Food that is high in saturated fat, such as fatty meat. ? Sweets, desserts, sugary drinks, and other foods with added sugar. ? Full-fat dairy products.  Do not salt foods before eating.  Try to eat at least 2 vegetarian meals each week.  Eat more home-cooked food and less restaurant, buffet, and fast food.  When eating at a restaurant, ask that your food be prepared with less salt or no salt, if  possible. What foods are recommended? The items listed may not be a complete list. Talk with your dietitian about what dietary choices are best for you. Grains Whole-grain or whole-wheat bread. Whole-grain or whole-wheat pasta. Brown rice. [place]. Bulgur. Whole-grain and low-sodium cereals. Pita bread. Low-fat, low-sodium crackers. Whole-wheat flour tortillas. Vegetables Fresh or frozen vegetables (raw, steamed, roasted, or  grilled). Low-sodium or reduced-sodium tomato and vegetable juice. Low-sodium or reduced-sodium tomato sauce and tomato paste. Low-sodium or reduced-sodium canned vegetables. Fruits All fresh, dried, or frozen fruit. Canned fruit in natural juice (without added sugar). Meat and other protein foods Skinless chicken or [place]. Ground chicken or [place]. Pork with fat trimmed off. Fish and seafood. Egg whites. Dried beans, peas, or lentils. Unsalted nuts, nut butters, and seeds. Unsalted canned beans. Lean cuts of beef with fat trimmed off. Low-sodium, lean deli meat. Dairy Low-fat (1%) or fat-free (skim) milk. Fat-free, low-fat, or reduced-fat cheeses. Nonfat, low-sodium ricotta or cottage cheese. Low-fat or nonfat yogurt. Low-fat, low-sodium cheese. Fats and oils Soft margarine without trans fats. Vegetable oil. Low-fat, reduced-fat, or light mayonnaise and salad dressings (reduced-sodium). Canola, safflower, olive, soybean, and sunflower oils. Avocado. Seasoning and other foods Herbs. Spices. Seasoning mixes without salt. Unsalted popcorn and pretzels. Fat-free sweets. What foods are not recommended? The items listed may not be a complete list. Talk with your dietitian about what dietary choices are best for you. Grains Baked goods made with fat, such as croissants, muffins, or some breads. Dry pasta or rice meal packs. Vegetables Creamed or fried vegetables. Vegetables in a cheese sauce. Regular canned vegetables (not low-sodium or reduced-sodium). Regular canned tomato sauce and paste (not low-sodium or reduced-sodium). Regular tomato and vegetable juice (not low-sodium or reduced-sodium). Angie Fava. Olives. Fruits Canned fruit in a light or heavy syrup. Fried fruit. Fruit in cream or butter sauce. Meat and other protein foods Fatty cuts of meat. Ribs. Fried meat. Berniece Salines. Sausage. Bologna and other processed lunch meats. Salami. Fatback. Hotdogs. Bratwurst. Salted nuts and seeds. Canned beans with  added salt. Canned or smoked fish. Whole eggs or egg yolks. Chicken or [place] with skin. Dairy Whole or 2% milk, cream, and half-and-half. Whole or full-fat cream cheese. Whole-fat or sweetened yogurt. Full-fat cheese. Nondairy creamers. Whipped toppings. Processed cheese and cheese spreads. Fats and oils Butter. Stick margarine. Lard. Shortening. Ghee. Bacon fat. Tropical oils, such as coconut, palm kernel, or palm oil. Seasoning and other foods Salted popcorn and pretzels. Onion salt, garlic salt, seasoned salt, table salt, and sea salt. Worcestershire sauce. Tartar sauce. Barbecue sauce. Teriyaki sauce. Soy sauce, including reduced-sodium. Steak sauce. Canned and packaged gravies. Fish sauce. Oyster sauce. Cocktail sauce. Horseradish that you find on the shelf. Ketchup. Mustard. Meat flavorings and tenderizers. Bouillon cubes. Hot sauce and Tabasco sauce. Premade or packaged marinades. Premade or packaged taco seasonings. Relishes. Regular salad dressings. Where to find more information:  National Heart, Lung, and [place]: https://wilson-eaton.com/  American Heart Association: www.heart.org Summary  The DASH eating plan is a healthy eating plan that has been shown to reduce high blood pressure (hypertension). It may also reduce your risk for type 2 diabetes, heart disease, and stroke.  With the DASH eating plan, you should limit salt (sodium) intake to 2,300 mg a day. If you have hypertension, you may need to reduce your sodium intake to 1,500 mg a day.  When on the DASH eating plan, aim to eat more fresh fruits and vegetables, whole grains, lean proteins, low-fat dairy, and heart-healthy fats.  Work with your health care provider or diet and nutrition specialist (dietitian) to adjust your eating plan to your individual calorie needs. This information is not intended to replace advice given to you by your health care provider. Make sure you discuss any questions you have with your health care  provider. Document Released: 08/10/2011 Document Revised: 08/14/2016 Document Reviewed: 08/14/2016 Elsevier Interactive Patient Education  [employer].    If you need a refill on your cardiac medications before your next appointment, please call your pharmacy.      Signed, Daune Perch, NP  11/27/2017 11:54 AM    [facility]   I have examined the patient and reviewed assessment and plan and discussed with patient.  Agree with above as stated.  Musculoskeletal pain-reproducible to palpation.  Will check echo given ECG changes.  Larae Grooms

## 2017-11-27 NOTE — Patient Instructions (Addendum)
Medication Instructions:  1) START Amlodipine 5mg  once daily  Labwork: None  Testing/Procedures: Your physician has requested that you have an echocardiogram. Echocardiography is a painless test that uses sound waves to create images of your heart. It provides your doctor with information about the size and shape of your heart and how well your heart's chambers and valves are working. This procedure takes approximately one hour. There are no restrictions for this procedure.   Follow-Up: Your physician wants you to follow-up in: 6 months with Dr. Irish Lack.  You will receive a reminder letter in the mail two months in advance. If you don't receive a letter, please call our office to schedule the follow-up appointment.   Any Other Special Instructions Will Be Listed Below (If Applicable).   DASH Eating Plan DASH stands for "Dietary Approaches to Stop Hypertension." The DASH eating plan is a healthy eating plan that has been shown to reduce high blood pressure (hypertension). It may also reduce your risk for type 2 diabetes, heart disease, and stroke. The DASH eating plan may also help with weight loss. What are tips for following this plan? General guidelines  Avoid eating more than 2,300 mg (milligrams) of salt (sodium) a day. If you have hypertension, you may need to reduce your sodium intake to 1,500 mg a day.  Limit alcohol intake to no more than 1 drink a day for nonpregnant women and 2 drinks a day for men. One drink equals 12 oz of beer, 5 oz of wine, or 1 oz of hard liquor.  Work with your health care provider to maintain a healthy body weight or to lose weight. Ask what an ideal weight is for you.  Get at least 30 minutes of exercise that causes your heart to beat faster (aerobic exercise) most days of the week. Activities may include walking, swimming, or biking.  Work with your health care provider or diet and nutrition specialist (dietitian) to adjust your eating plan to your  individual calorie needs. Reading food labels  Check food labels for the amount of sodium per serving. Choose foods with less than 5 percent of the Daily Value of sodium. Generally, foods with less than 300 mg of sodium per serving fit into this eating plan.  To find whole grains, look for the word "whole" as the first word in the ingredient list. Shopping  Buy products labeled as "low-sodium" or "no salt added."  Buy fresh foods. Avoid canned foods and premade or frozen meals. Cooking  Avoid adding salt when cooking. Use salt-free seasonings or herbs instead of table salt or sea salt. Check with your health care provider or pharmacist before using salt substitutes.  Do not fry foods. Cook foods using healthy methods such as baking, boiling, grilling, and broiling instead.  Cook with heart-healthy oils, such as olive, canola, soybean, or sunflower oil. Meal planning   Eat a balanced diet that includes: ? 5 or more servings of fruits and vegetables each day. At each meal, try to fill half of your plate with fruits and vegetables. ? Up to 6-8 servings of whole grains each day. ? Less than 6 oz of lean meat, poultry, or fish each day. A 3-oz serving of meat is about the same size as a deck of cards. One egg equals 1 oz. ? 2 servings of low-fat dairy each day. ? A serving of nuts, seeds, or beans 5 times each week. ? Heart-healthy fats. Healthy fats called Omega-3 fatty acids are found in foods  such as flaxseeds and coldwater fish, like sardines, salmon, and mackerel.  Limit how much you eat of the following: ? Canned or prepackaged foods. ? Food that is high in trans fat, such as fried foods. ? Food that is high in saturated fat, such as fatty meat. ? Sweets, desserts, sugary drinks, and other foods with added sugar. ? Full-fat dairy products.  Do not salt foods before eating.  Try to eat at least 2 vegetarian meals each week.  Eat more home-cooked food and less restaurant,  buffet, and fast food.  When eating at a restaurant, ask that your food be prepared with less salt or no salt, if possible. What foods are recommended? The items listed may not be a complete list. Talk with your dietitian about what dietary choices are best for you. Grains Whole-grain or whole-wheat bread. Whole-grain or whole-wheat pasta. Brown rice. [place]. Bulgur. Whole-grain and low-sodium cereals. Pita bread. Low-fat, low-sodium crackers. Whole-wheat flour tortillas. Vegetables Fresh or frozen vegetables (raw, steamed, roasted, or grilled). Low-sodium or reduced-sodium tomato and vegetable juice. Low-sodium or reduced-sodium tomato sauce and tomato paste. Low-sodium or reduced-sodium canned vegetables. Fruits All fresh, dried, or frozen fruit. Canned fruit in natural juice (without added sugar). Meat and other protein foods Skinless chicken or [place]. Ground chicken or [place]. Pork with fat trimmed off. Fish and seafood. Egg whites. Dried beans, peas, or lentils. Unsalted nuts, nut butters, and seeds. Unsalted canned beans. Lean cuts of beef with fat trimmed off. Low-sodium, lean deli meat. Dairy Low-fat (1%) or fat-free (skim) milk. Fat-free, low-fat, or reduced-fat cheeses. Nonfat, low-sodium ricotta or cottage cheese. Low-fat or nonfat yogurt. Low-fat, low-sodium cheese. Fats and oils Soft margarine without trans fats. Vegetable oil. Low-fat, reduced-fat, or light mayonnaise and salad dressings (reduced-sodium). Canola, safflower, olive, soybean, and sunflower oils. Avocado. Seasoning and other foods Herbs. Spices. Seasoning mixes without salt. Unsalted popcorn and pretzels. Fat-free sweets. What foods are not recommended? The items listed may not be a complete list. Talk with your dietitian about what dietary choices are best for you. Grains Baked goods made with fat, such as croissants, muffins, or some breads. Dry pasta or rice meal packs. Vegetables Creamed or fried  vegetables. Vegetables in a cheese sauce. Regular canned vegetables (not low-sodium or reduced-sodium). Regular canned tomato sauce and paste (not low-sodium or reduced-sodium). Regular tomato and vegetable juice (not low-sodium or reduced-sodium). Angie Fava. Olives. Fruits Canned fruit in a light or heavy syrup. Fried fruit. Fruit in cream or butter sauce. Meat and other protein foods Fatty cuts of meat. Ribs. Fried meat. Berniece Salines. Sausage. Bologna and other processed lunch meats. Salami. Fatback. Hotdogs. Bratwurst. Salted nuts and seeds. Canned beans with added salt. Canned or smoked fish. Whole eggs or egg yolks. Chicken or [place] with skin. Dairy Whole or 2% milk, cream, and half-and-half. Whole or full-fat cream cheese. Whole-fat or sweetened yogurt. Full-fat cheese. Nondairy creamers. Whipped toppings. Processed cheese and cheese spreads. Fats and oils Butter. Stick margarine. Lard. Shortening. Ghee. Bacon fat. Tropical oils, such as coconut, palm kernel, or palm oil. Seasoning and other foods Salted popcorn and pretzels. Onion salt, garlic salt, seasoned salt, table salt, and sea salt. Worcestershire sauce. Tartar sauce. Barbecue sauce. Teriyaki sauce. Soy sauce, including reduced-sodium. Steak sauce. Canned and packaged gravies. Fish sauce. Oyster sauce. Cocktail sauce. Horseradish that you find on the shelf. Ketchup. Mustard. Meat flavorings and tenderizers. Bouillon cubes. Hot sauce and Tabasco sauce. Premade or packaged marinades. Premade or packaged taco seasonings. Relishes. Regular salad  dressings. Where to find more information:  National Heart, Lung, and [place]: https://wilson-eaton.com/  American Heart Association: www.heart.org Summary  The DASH eating plan is a healthy eating plan that has been shown to reduce high blood pressure (hypertension). It may also reduce your risk for type 2 diabetes, heart disease, and stroke.  With the DASH eating plan, you should limit salt (sodium)  intake to 2,300 mg a day. If you have hypertension, you may need to reduce your sodium intake to 1,500 mg a day.  When on the DASH eating plan, aim to eat more fresh fruits and vegetables, whole grains, lean proteins, low-fat dairy, and heart-healthy fats.  Work with your health care provider or diet and nutrition specialist (dietitian) to adjust your eating plan to your individual calorie needs. This information is not intended to replace advice given to you by your health care provider. Make sure you discuss any questions you have with your health care provider. Document Released: 08/10/2011 Document Revised: 08/14/2016 Document Reviewed: 08/14/2016 Elsevier Interactive Patient Education  [employer].    If you need a refill on your cardiac medications before your next appointment, please call your pharmacy.

## 2017-12-03 ENCOUNTER — Encounter: Payer: Self-pay | Admitting: Family Medicine

## 2017-12-03 ENCOUNTER — Ambulatory Visit (INDEPENDENT_AMBULATORY_CARE_PROVIDER_SITE_OTHER): Payer: Medicare Other | Admitting: Family Medicine

## 2017-12-03 VITALS — BP 128/74 | HR 78 | Temp 97.5°F | Resp 16 | Ht 69.5 in | Wt 223.0 lb

## 2017-12-03 DIAGNOSIS — N401 Enlarged prostate with lower urinary tract symptoms: Secondary | ICD-10-CM | POA: Diagnosis not present

## 2017-12-03 DIAGNOSIS — R079 Chest pain, unspecified: Secondary | ICD-10-CM | POA: Diagnosis not present

## 2017-12-03 DIAGNOSIS — M5412 Radiculopathy, cervical region: Secondary | ICD-10-CM | POA: Diagnosis not present

## 2017-12-03 DIAGNOSIS — R351 Nocturia: Secondary | ICD-10-CM | POA: Diagnosis not present

## 2017-12-03 MED ORDER — PREDNISONE 20 MG PO TABS: ORAL_TABLET | ORAL | 0 refills | Status: DC

## 2017-12-03 NOTE — Progress Notes (Signed)
Subjective:    Patient ID: Robert Page, male    DOB: 1946-03-31, 72 y.o.   MRN: [id number]  HPI  10/29/17 Patient presents today with atypical left chest wall pain.  Pain is located at the upper left sternal border.  It is been there for 2 weeks off and on.  There is no exacerbating or alleviating factors.  In fact he feels some tenderness and pain there now however I am able to reproduce the pain by palpation in the upper left sternal border.  He is tender in that area.  The pain began after he was hanging from his left arm building a barn.  He believes he may have strained a chest wall muscle while doing this.  He denies any chest pain with activity.  He denies any classic angina.  He had a negative stress test in 2014.  His EKG is abnormal on a chronic basis with ST downsloping depression in the inferior and lateral leads.  This is been present all the way back to 2014 and shows no significant change today.  He also reports however dyspnea on exertion.  This is a new finding.  He also complains of night sweats.  He states that he will wake up soaking wet for no reason.  He denies any fevers or chills.  He does report fatigue. EKG today shows nonspecific downsloping ST depression in leads II, 3, aVF, and in the precordial leads V4, V5 and V6.  These are chronic findings.  At that time, my plan was:  Patient's chest pain is atypical and unrelated to exertion and reproducible by palpation in the left upper sternal border.  I suspect costochondritis versus a chest wall muscle strain.  However given his age and dyspnea on exertion, I believe a cardiology evaluation is warranted.  If the patient's pain returns or intensifies, I instructed the patient to go to the hospital immediately.  Given his night sweats, I will complete a metabolic workup including a CBC, CMP, TSH, sed rate, and testosterone level.  I will treat his hypertension with 25 mg of hydrochlorothiazide a day in addition to losartan.  Given  his history of forming in his exposure to birds and mold, I will also obtain a chest x-ray  12/03/17 For the last 6 weeks, patient continues to battle pain that radiates from his left neck down his neck into his left shoulder, occasionally into his left forearm and also into his left chest adjacent to the sternum.  Made worse by hyperextension of the neck.  He denies any weakness in his left hand.  He denies any weakness in his left arm.  He does report occasional numbness and tingling in his left forearm.  He reports shooting pains coming from his neck into his left chest.  This seems to be related to range of motion in the neck. Past Medical History:  Diagnosis Date  . Adenomatous polyp   . Allergy   . Atrial tachycardia ([place])   . Diverticulosis   . Dupuytren's disease    palms and soles  . GERD (gastroesophageal reflux disease)   . Hypertension    Past Surgical History:  Procedure Laterality Date  . BACK SURGERY     12/2013  . COLECTOMY  2010   for diverticulitis  . LASIK  2002   [facility]   Current Outpatient Medications on File Prior to Visit  Medication Sig Dispense Refill  . amLODipine (NORVASC) 5 MG tablet Take 1 tablet (5 mg  total) by mouth daily. 90 tablet 1  . aspirin EC 81 MG tablet Take 81 mg by mouth daily.    . famotidine (PEPCID) 20 MG tablet Take 20 mg by mouth as needed.     . fexofenadine (ALLEGRA) 180 MG tablet Take 180 mg by mouth daily.     . hydrochlorothiazide (HYDRODIURIL) 25 MG tablet Take 1 tablet (25 mg total) by mouth daily. 90 tablet 3  . losartan (COZAAR) 100 MG tablet TAKE 1 TABLET(100 MG) BY MOUTH DAILY 90 tablet 3  . omeprazole (PRILOSEC) 20 MG capsule Take 20 mg by mouth daily.     No current facility-administered medications on file prior to visit.    Allergies  Allergen Reactions  . Ibuprofen     REACTION: stomach cramps  . Sulfonamide Derivatives     REACTION: itch   Social History   Socioeconomic History  . Marital status: Married    Spouse  name: Shafer Swamy  . Number of children: Not on file  . Years of education: Not on file  . Highest education level: Not on file  Occupational History  . Occupation: retired  Scientific laboratory technician  . Financial resource strain: Not on file  . Food insecurity:    Worry: Not on file    Inability: Not on file  . Transportation needs:    Medical: Not on file    Non-medical: Not on file  Tobacco Use  . Smoking status: Former Smoker    Types: Cigars    Last attempt to quit: 2015    Years since quitting: 4.2  . Smokeless tobacco: Never Used  . Tobacco comment: Pt states smokes occasional cigar.  This is typically < 1 time per month  Substance and Sexual Activity  . Alcohol use: No    Alcohol/week: 0.0 oz  . Drug use: No  . Sexual activity: Not on file  Lifestyle  . Physical activity:    Days per week: Not on file    Minutes per session: Not on file  . Stress: Not on file  Relationships  . Social connections:    Talks on phone: Not on file    Gets together: Not on file    Attends religious service: Not on file    Active member of club or organization: Not on file    Attends meetings of clubs or organizations: Not on file    Relationship status: Not on file  . Intimate partner violence:    Fear of current or ex partner: Not on file    Emotionally abused: Not on file    Physically abused: Not on file    Forced sexual activity: Not on file  Other Topics Concern  . Not on file  Social History Narrative   Pt lives in [place].  Retired from [employer] and [employer].  Goes on mission trips to [place], gets regular exercise.      Review of Systems  All other systems reviewed and are negative.      Objective:   Physical Exam  Constitutional: He appears well-developed and well-nourished. No distress.  Neck: Neck supple. No JVD present.  Cardiovascular: Normal rate, regular rhythm, normal heart sounds and intact distal pulses. Exam reveals no gallop and no friction rub.  No murmur  heard. Pulmonary/Chest: Effort normal and breath sounds normal. No respiratory distress. He has no wheezes. He has no rales. He exhibits tenderness. Left breast exhibits tenderness.    Musculoskeletal: He exhibits tenderness. He exhibits no edema.  Left shoulder: He exhibits tenderness and pain.       Cervical back: He exhibits decreased range of motion, tenderness and pain. He exhibits no bony tenderness and no spasm.       Back:       Arms: Lymphadenopathy:    He has no cervical adenopathy.  Skin: He is not diaphoretic.  Vitals reviewed.         Assessment & Plan:  Cervical radiculopathy - Plan: MR Cervical Spine Wo Contrast, predniSONE (DELTASONE) 20 MG tablet  Chest pain, unspecified type - Plan: MR Cervical Spine Wo Contrast I suspect that the patient has cervical radiculopathy.  Begin by obtaining an MRI of the cervical spine.  He is failed 6 weeks of conservative therapy including rest and anti-inflammatories.  Meanwhile try prednisone taper pack as a suspect this could be a herniated disc.  Patient believes he injured his neck initially while hanging from a roof working on a barn and hyperextending his neck while working on the roof

## 2017-12-07 ENCOUNTER — Other Ambulatory Visit: Payer: Self-pay

## 2017-12-07 ENCOUNTER — Ambulatory Visit (HOSPITAL_COMMUNITY): Payer: Medicare Other | Attending: Cardiovascular Disease

## 2017-12-07 DIAGNOSIS — I071 Rheumatic tricuspid insufficiency: Secondary | ICD-10-CM | POA: Insufficient documentation

## 2017-12-07 DIAGNOSIS — R0609 Other forms of dyspnea: Secondary | ICD-10-CM | POA: Insufficient documentation

## 2017-12-07 DIAGNOSIS — I119 Hypertensive heart disease without heart failure: Secondary | ICD-10-CM | POA: Insufficient documentation

## 2017-12-07 DIAGNOSIS — I77810 Thoracic aortic ectasia: Secondary | ICD-10-CM | POA: Insufficient documentation

## 2017-12-10 ENCOUNTER — Telehealth: Payer: Self-pay | Admitting: Cardiology

## 2017-12-10 ENCOUNTER — Ambulatory Visit
Admission: RE | Admit: 2017-12-10 | Discharge: 2017-12-10 | Disposition: A | Discharge status: Home / Self Care | Payer: Medicare Other | Source: Ambulatory Visit | Attending: Family Medicine | Admitting: Family Medicine

## 2017-12-10 DIAGNOSIS — M50222 Other cervical disc displacement at C5-C6 level: Secondary | ICD-10-CM | POA: Diagnosis not present

## 2017-12-10 DIAGNOSIS — N401 Enlarged prostate with lower urinary tract symptoms: Secondary | ICD-10-CM | POA: Diagnosis not present

## 2017-12-10 DIAGNOSIS — I77810 Thoracic aortic ectasia: Secondary | ICD-10-CM

## 2017-12-10 DIAGNOSIS — R3912 Poor urinary stream: Secondary | ICD-10-CM | POA: Diagnosis not present

## 2017-12-10 DIAGNOSIS — M5412 Radiculopathy, cervical region: Secondary | ICD-10-CM

## 2017-12-10 DIAGNOSIS — R079 Chest pain, unspecified: Secondary | ICD-10-CM

## 2017-12-10 NOTE — Telephone Encounter (Signed)
New Message  Patient is returning call in reference to echo results. Please call to discuss.  

## 2017-12-10 NOTE — Telephone Encounter (Signed)
Left message to call back  

## 2017-12-11 ENCOUNTER — Other Ambulatory Visit: Payer: Self-pay | Admitting: Family Medicine

## 2017-12-11 ENCOUNTER — Encounter (INDEPENDENT_AMBULATORY_CARE_PROVIDER_SITE_OTHER): Payer: Self-pay

## 2017-12-11 DIAGNOSIS — M5412 Radiculopathy, cervical region: Secondary | ICD-10-CM

## 2017-12-11 NOTE — Telephone Encounter (Signed)
-----   Message from Daune Perch, NP sent at 12/10/2017  2:01 PM EDT ----- Please let pt know that his echo showed normal pumping function of the heart and no evidence of prior heart damage. There was a mildly dilated ascending aorta. I don't see that this has been identified on prior CT scans. We should obtain CT angiography to fully assess the thoracic aorta. Once we get this information we will probably be able to follow annually to assess for any changes. It will be important for the patient to have good BP control.  Please arrange for CTA aorta at patient's convenience.   Routed to PCP and Dr. Kieth Brightly, NP

## 2017-12-11 NOTE — Telephone Encounter (Signed)
LM for pt to call back.

## 2017-12-11 NOTE — Telephone Encounter (Signed)
Follow up    Patient returning call to nurse. Nurse not available. Please call patient at [phone].

## 2017-12-11 NOTE — Telephone Encounter (Signed)
Called and made patient aware that his echo showed normal heart pumping function and that there is not any evidence of prior heart damage. Made patient aware that there was a mildly dilated ascending aorta. Made patient aware that we would like to get a CTA of the aorta to better assess his thoracic aorta. Made patient aware that after this we will likely monitor it annually. Also made patient aware that it is important to maintain good BP control. Patient verbalizes understanding and is in agreement with this plan. BMET ordered and lab appointment made for 4/10. CTA aorta ordered. Patient understands that he will receive a call to schedule this appointment.

## 2017-12-12 ENCOUNTER — Telehealth: Payer: Self-pay | Admitting: Family Medicine

## 2017-12-12 ENCOUNTER — Other Ambulatory Visit: Payer: Medicare Other | Admitting: *Deleted

## 2017-12-12 ENCOUNTER — Other Ambulatory Visit: Payer: Self-pay | Admitting: Cardiology

## 2017-12-12 DIAGNOSIS — I77810 Thoracic aortic ectasia: Secondary | ICD-10-CM | POA: Diagnosis not present

## 2017-12-12 NOTE — Telephone Encounter (Signed)
Patient walked into office this morning to discuss his Orthopedic referral. Patient states that [facility] contacted him and he held off on the appointment because he feels that he can just go back and see Dr. Ellene Route at [facility]. Patient states that if he has to have injections to his back or surgery to fix the problem he would rather go back and see them. He states he does not want to be wasting money going to see several different specialist. Please advise?

## 2017-12-13 LAB — BASIC METABOLIC PANEL
BUN / CREAT RATIO: 20 (ref 10–24)
BUN: 24 mg/dL (ref 8–27)
CO2: 25 mmol/L (ref 20–29)
CREATININE: 1.19 mg/dL (ref 0.76–1.27)
Calcium: 9.3 mg/dL (ref 8.6–10.2)
Chloride: 103 mmol/L (ref 96–106)
GFR calc non Af Amer: 62 mL/min/{1.73_m2} (ref >59)
GFR, EST AFRICAN AMERICAN: 71 mL/min/{1.73_m2} (ref >59)
Glucose: 98 mg/dL (ref 65–99)
Potassium: 4.1 mmol/L (ref 3.5–5.2)
SODIUM: 142 mmol/L (ref 134–144)

## 2017-12-13 NOTE — Telephone Encounter (Signed)
Noted  

## 2017-12-13 NOTE — Telephone Encounter (Signed)
As stated in the results of the MRI, there is nothing on the mri that would benefit from surgery or injections in his neck.  I do not think Dr. Ellene Route can help him and would recommend orthopedics.  However, if he wants to discuss this with Dr. Ellene Route first, that is his decision.

## 2017-12-13 NOTE — Telephone Encounter (Signed)
Spoke with patient and informed him per Dr.[name]- As stated with MRI, there is nothing on the MRI that would benefit from surgery or injections in the neck. Dr. [name] does not think that Dr.Elsner can help and would recommend Orthopedics. Patient verbalized understanding and stated that he still believes that he needs to see Dr.Elsner or a PA at his office for their opinion. If they recommend orthopedics he will call the orthopedic office back that we sent him to and schedule.

## 2017-12-14 NOTE — Telephone Encounter (Signed)
FYI Patient called back and stated that he talked with Dr.Elsner office and they also recommended that he see Orthopedics. [place] and got patient scheduled for 12/18/17 to see [facility].

## 2017-12-17 NOTE — Telephone Encounter (Signed)
Thanks

## 2017-12-18 DIAGNOSIS — M5412 Radiculopathy, cervical region: Secondary | ICD-10-CM | POA: Diagnosis not present

## 2017-12-18 DIAGNOSIS — M542 Cervicalgia: Secondary | ICD-10-CM | POA: Diagnosis not present

## 2017-12-25 ENCOUNTER — Ambulatory Visit (INDEPENDENT_AMBULATORY_CARE_PROVIDER_SITE_OTHER)
Admission: RE | Admit: 2017-12-25 | Discharge: 2017-12-25 | Disposition: A | Discharge status: Home / Self Care | Payer: Medicare Other | Source: Ambulatory Visit | Attending: Cardiology | Admitting: Cardiology

## 2017-12-25 DIAGNOSIS — I77810 Thoracic aortic ectasia: Secondary | ICD-10-CM

## 2017-12-25 DIAGNOSIS — R911 Solitary pulmonary nodule: Secondary | ICD-10-CM | POA: Diagnosis not present

## 2017-12-25 MED ORDER — IOPAMIDOL (ISOVUE-370) INJECTION 76%
100.0000 mL | Freq: Once | INTRAVENOUS | Status: AC | PRN
  Administered 2017-12-25: 100 mL via INTRAVENOUS

## 2017-12-28 ENCOUNTER — Telehealth: Payer: Self-pay | Admitting: Interventional Cardiology

## 2017-12-28 NOTE — Telephone Encounter (Signed)
Daune Perch, NP  P Cv Div Ch St Triage        Please let the patient know that his CT showed that his thoracic aorta without any aneurysm. No need to follow.  He does have some evidence of plaque in the left coronary artery. He should work on reducing his risk factors including blood pressure control, cholesterol control, heart healthy diet and exercise.  No need for cardiology follow up at this time but please call us if needed.   Please send copy to Susy Frizzle, MD  Daune Perch, NP     Notified the pt of his CT results and recommendations per Daune Perch NP. Pt verbalized understanding and agrees with this plan.

## 2017-12-28 NOTE — Telephone Encounter (Signed)
Pt calling and stated he is returning a call from nurse

## 2018-02-08 ENCOUNTER — Ambulatory Visit (INDEPENDENT_AMBULATORY_CARE_PROVIDER_SITE_OTHER): Payer: Medicare Other | Admitting: Family Medicine

## 2018-02-08 ENCOUNTER — Encounter: Payer: Self-pay | Admitting: Family Medicine

## 2018-02-08 ENCOUNTER — Other Ambulatory Visit: Payer: Self-pay | Admitting: Family Medicine

## 2018-02-08 VITALS — BP 146/82 | HR 70 | Temp 98.5°F | Resp 16 | Ht 69.0 in | Wt 212.0 lb

## 2018-02-08 DIAGNOSIS — S01312A Laceration without foreign body of left ear, initial encounter: Secondary | ICD-10-CM

## 2018-02-08 NOTE — Progress Notes (Signed)
   Subjective:    Patient ID: Robert Page, male    DOB: 06/23/1946, 72 y.o.   MRN: [id number]  HPI Patient was working on his tractor when he struck his left ear on a piece of the equipment.  He sustained a vertical very superficial laceration to the superior helix as diagrammed.  Laceration is essentially a skin tear.  There is no active bleeding.  Skin edges are closely approximated without any intervention.  Tetanus is up-to-date.  Patient is here today for evaluation.  He denies any loss of consciousness.  He denies any head injury.  He denies any hearing loss.  He denies any tinnitus or dizziness. Past Medical History:  Diagnosis Date  . Adenomatous polyp   . Allergy   . Atrial tachycardia ([place])   . Diverticulosis   . Dupuytren's disease    palms and soles  . GERD (gastroesophageal reflux disease)   . Hypertension    Past Surgical History:  Procedure Laterality Date  . BACK SURGERY     12/2013  . COLECTOMY  2010   for diverticulitis  . LASIK  2002   Bil   Current Outpatient Medications on File Prior to Visit  Medication Sig Dispense Refill  . amLODipine (NORVASC) 5 MG tablet Take 1 tablet (5 mg total) by mouth daily. 90 tablet 1  . aspirin EC 81 MG tablet Take 81 mg by mouth daily.    . famotidine (PEPCID) 20 MG tablet Take 20 mg by mouth as needed.     . fexofenadine (ALLEGRA) 180 MG tablet Take 180 mg by mouth daily.     . hydrochlorothiazide (HYDRODIURIL) 25 MG tablet Take 1 tablet (25 mg total) by mouth daily. 90 tablet 3  . losartan (COZAAR) 100 MG tablet TAKE 1 TABLET(100 MG) BY MOUTH DAILY 90 tablet 2  . omeprazole (PRILOSEC) 20 MG capsule Take 20 mg by mouth daily.     No current facility-administered medications on file prior to visit.    Allergies  Allergen Reactions  . Ibuprofen     REACTION: stomach cramps  . Sulfonamide Derivatives     REACTION: itch      Review of Systems  All other systems reviewed and are negative.      Objective:   Physical  Exam  Constitutional: He appears well-developed and well-nourished. No distress.  HENT:  Left Ear: Hearing, tympanic membrane and ear canal normal. Left ear exhibits lacerations.  Ears:  Cardiovascular: Normal rate and regular rhythm.  Pulmonary/Chest: Effort normal and breath sounds normal.  Skin: He is not diaphoretic.  Vitals reviewed.         Assessment & Plan:  Laceration of helix of left ear, initial encounter  Area was cleaned thoroughly with hydrogen peroxide and rubbing alcohol.  After the skin dried, the wound skin edges were reinforced with Steri-Strips and adhesive.  Wound care was discussed.  Skin edges were already approximated well naturally.  The wound is so superficial, I do not believe that sutures are necessary.  Follow-up as needed.

## 2018-02-12 ENCOUNTER — Other Ambulatory Visit: Payer: Self-pay | Admitting: Family Medicine

## 2018-02-12 MED ORDER — LOSARTAN POTASSIUM 100 MG PO TABS: ORAL_TABLET | ORAL | 3 refills | Status: DC

## 2018-02-21 ENCOUNTER — Encounter: Payer: Self-pay | Admitting: Gastroenterology

## 2018-03-11 ENCOUNTER — Ambulatory Visit (INDEPENDENT_AMBULATORY_CARE_PROVIDER_SITE_OTHER): Payer: Medicare Other | Admitting: Family Medicine

## 2018-03-11 ENCOUNTER — Encounter: Payer: Self-pay | Admitting: Family Medicine

## 2018-03-11 ENCOUNTER — Other Ambulatory Visit: Payer: Self-pay

## 2018-03-11 VITALS — BP 132/82 | HR 78 | Temp 98.1°F | Resp 14 | Ht 69.0 in | Wt 213.0 lb

## 2018-03-11 DIAGNOSIS — T1592XA Foreign body on external eye, part unspecified, left eye, initial encounter: Secondary | ICD-10-CM

## 2018-03-11 MED ORDER — POLYMYXIN B-TRIMETHOPRIM 10000-0.1 UNIT/ML-% OP SOLN: 2.0000 [drp] | OPHTHALMIC | 0 refills | Status: DC

## 2018-03-11 NOTE — Progress Notes (Signed)
Subjective:    Patient ID: Robert Page, male    DOB: Oct 04, 1945, 72 y.o.   MRN: [id number]  HPI Patient was cutting lumbar building a barn when he felt sawdust and would fly into his left eye.  This occurred earlier this morning.  Ever since that moment he has had a foreign body sensation in his left eye.  His left eye is erythematous and irritated and watering.  He denies any blurry vision.  He denies any double vision.  Eyelids were everted and swept and a small 1 mm granular piece of debris was seen floating along the surface of the cornea afterwards.  This was removed with a Q-tip.  Fluorescein exam was then performed of the eyeball using a Woods lamp.  There was no visible corneal abrasion or ulceration.  Seidel sign was negative. Immunization History  Administered Date(s) Administered  . H1N1 09/23/2008  . Influenza Split 06/04/2014  . Influenza,inj,Quad PF,6+ Mos 08/08/2013, 05/17/2015, 10/17/2016  . Pneumococcal Conjugate-13 10/17/2016  . Pneumococcal Polysaccharide-23 03/11/2013  . Td 03/04/2004  . Tdap 10/17/2016    Past Medical History:  Diagnosis Date  . Adenomatous polyp   . Allergy   . Atrial tachycardia ([place])   . Diverticulosis   . Dupuytren's disease    palms and soles  . GERD (gastroesophageal reflux disease)   . Hypertension    Past Surgical History:  Procedure Laterality Date  . BACK SURGERY     12/2013  . COLECTOMY  2010   for diverticulitis  . LASIK  2002   Bil   Current Outpatient Medications on File Prior to Visit  Medication Sig Dispense Refill  . amLODipine (NORVASC) 5 MG tablet Take 1 tablet (5 mg total) by mouth daily. 90 tablet 1  . aspirin EC 81 MG tablet Take 81 mg by mouth daily.    . famotidine (PEPCID) 20 MG tablet Take 20 mg by mouth as needed.     . fexofenadine (ALLEGRA) 180 MG tablet Take 180 mg by mouth daily.     . hydrochlorothiazide (HYDRODIURIL) 25 MG tablet Take 1 tablet (25 mg total) by mouth daily. 90 tablet 3  . losartan  (COZAAR) 100 MG tablet TAKE 1 TABLET(100 MG) BY MOUTH DAILY 90 tablet 3  . omeprazole (PRILOSEC) 20 MG capsule Take 20 mg by mouth daily.     No current facility-administered medications on file prior to visit.    Allergies  Allergen Reactions  . Ibuprofen     REACTION: stomach cramps  . Sulfonamide Derivatives     REACTION: itch   Social History   Socioeconomic History  . Marital status: Married    Spouse name: Romano Stigger  . Number of children: Not on file  . Years of education: Not on file  . Highest education level: Not on file  Occupational History  . Occupation: retired  Scientific laboratory technician  . Financial resource strain: Not on file  . Food insecurity:    Worry: Not on file    Inability: Not on file  . Transportation needs:    Medical: Not on file    Non-medical: Not on file  Tobacco Use  . Smoking status: Former Smoker    Types: Cigars    Last attempt to quit: 2015    Years since quitting: 4.5  . Smokeless tobacco: Never Used  . Tobacco comment: Pt states smokes occasional cigar.  This is typically < 1 time per month  Substance and Sexual Activity  .  Alcohol use: No    Alcohol/week: 0.0 oz  . Drug use: No  . Sexual activity: Not on file  Lifestyle  . Physical activity:    Days per week: Not on file    Minutes per session: Not on file  . Stress: Not on file  Relationships  . Social connections:    Talks on phone: Not on file    Gets together: Not on file    Attends religious service: Not on file    Active member of club or organization: Not on file    Attends meetings of clubs or organizations: Not on file    Relationship status: Not on file  . Intimate partner violence:    Fear of current or ex partner: Not on file    Emotionally abused: Not on file    Physically abused: Not on file    Forced sexual activity: Not on file  Other Topics Concern  . Not on file  Social History Narrative   Pt lives in [place].  Retired from [employer] and [employer].  Goes  on mission trips to [place], gets regular exercise.   Family History  Problem Relation Age of Onset  . Hypertension Mother   . Macular degeneration Mother   . Kidney disease Mother   . Dementia Mother   . Hypertension Father   . Melanoma Sister   . Stroke Unknown        uncle  . Prostate cancer Unknown        uncle  . Colon cancer Unknown        grandmother/uncle  . Kidney disease Unknown        grandmother  . Liver cancer Unknown        uncle (mets)  . Heart disease Unknown        aunt/uncle  . Lung cancer Unknown        uncle  . Stomach cancer Cousin   . Heart disease Cousin   . Colon cancer Paternal Grandmother   . Colon cancer Paternal Uncle   . Arthritis Brother   . Hypertension Brother      Review of Systems  All other systems reviewed and are negative.      Objective:   Physical Exam  Constitutional: He appears well-developed and well-nourished. No distress.  Eyes: Pupils are equal, round, and reactive to light. EOM are normal. Foreign body present in the left eye. Left conjunctiva is injected.  Cardiovascular: Normal rate, regular rhythm, normal heart sounds and intact distal pulses. Exam reveals no gallop and no friction rub.  No murmur heard. Pulmonary/Chest: Effort normal and breath sounds normal. No respiratory distress. He has no wheezes. He has no rales. He exhibits no tenderness.  Skin: He is not diaphoretic.  Vitals reviewed.         Assessment & Plan:  Foreign body of left eye, initial encounter  Foreign body was removed from the surface of the left eyeball gently with a Q-tip.  I was flushed thoroughly after being anesthetized.  Floor seen exam was normal.  Begin Polytrim eyedrops, 2 drops every 2 hours for 2 days then every 4 hours for 2 days and then twice a day until gone.  Recheck in 48 hours if no better or sooner if worsening

## 2018-03-12 ENCOUNTER — Ambulatory Visit: Payer: Self-pay | Admitting: Family Medicine

## 2018-03-14 ENCOUNTER — Encounter: Payer: Self-pay | Admitting: Family Medicine

## 2018-03-18 DIAGNOSIS — T1512XA Foreign body in conjunctival sac, left eye, initial encounter: Secondary | ICD-10-CM | POA: Diagnosis not present

## 2018-03-18 DIAGNOSIS — H2513 Age-related nuclear cataract, bilateral: Secondary | ICD-10-CM | POA: Diagnosis not present

## 2018-03-18 DIAGNOSIS — H524 Presbyopia: Secondary | ICD-10-CM | POA: Diagnosis not present

## 2018-03-18 DIAGNOSIS — H52203 Unspecified astigmatism, bilateral: Secondary | ICD-10-CM | POA: Diagnosis not present

## 2018-03-21 ENCOUNTER — Telehealth: Payer: Self-pay | Admitting: Family Medicine

## 2018-03-21 NOTE — Telephone Encounter (Signed)
Patient called in after receiving a letter stating he had missed an appointment. He wanted me to look and see if he actually did miss an appointment he didn't feel that he had missed an appointment. After reviewing his appointment desk and chart I saw that he had two appointments scheduled for 03/11/18. Patient had presented in clinic on 03/11/18 for an eye injury he was placed on the nurses scheduled and later put on Dr. Samella Parr schedule. The front staff didn't arrive the appointment for Dr. [name] since they weren't aware that he had been put on his schedule. I have changed the EOD status from no show to complete and apologized to the patient that he had received the letter in error. We have also voided the letter that went out in error to patient. I explained to the patient that it was in error and that I was sorry that he had received the letter.  CB# [phone]

## 2018-03-26 ENCOUNTER — Encounter: Payer: Self-pay | Admitting: Gastroenterology

## 2018-05-15 ENCOUNTER — Ambulatory Visit (AMBULATORY_SURGERY_CENTER): Payer: Self-pay | Admitting: *Deleted

## 2018-05-15 VITALS — Ht 69.5 in | Wt 217.0 lb

## 2018-05-15 DIAGNOSIS — Z86010 Personal history of colonic polyps: Secondary | ICD-10-CM

## 2018-05-15 MED ORDER — NA SULFATE-K SULFATE-MG SULF 17.5-3.13-1.6 GM/177ML PO SOLN: ORAL | 0 refills | Status: DC

## 2018-05-15 NOTE — Progress Notes (Signed)
Patient denies any allergies to eggs or soy. Patient denies any problems with anesthesia/sedation. Patient denies any oxygen use at home. Patient denies taking any diet/weight loss medications or blood thinners. EMMI education offered, pt declined.  

## 2018-05-29 ENCOUNTER — Encounter: Payer: Self-pay | Admitting: Gastroenterology

## 2018-05-29 ENCOUNTER — Ambulatory Visit (AMBULATORY_SURGERY_CENTER): Payer: Medicare Other | Admitting: Gastroenterology

## 2018-05-29 ENCOUNTER — Other Ambulatory Visit: Payer: Self-pay | Admitting: Family Medicine

## 2018-05-29 VITALS — BP 125/72 | HR 51 | Temp 98.2°F | Resp 11 | Ht 69.5 in | Wt 217.0 lb

## 2018-05-29 DIAGNOSIS — K621 Rectal polyp: Secondary | ICD-10-CM

## 2018-05-29 DIAGNOSIS — D128 Benign neoplasm of rectum: Secondary | ICD-10-CM | POA: Diagnosis not present

## 2018-05-29 DIAGNOSIS — D127 Benign neoplasm of rectosigmoid junction: Secondary | ICD-10-CM | POA: Diagnosis not present

## 2018-05-29 DIAGNOSIS — D122 Benign neoplasm of ascending colon: Secondary | ICD-10-CM

## 2018-05-29 DIAGNOSIS — Z86010 Personal history of colonic polyps: Secondary | ICD-10-CM

## 2018-05-29 DIAGNOSIS — D129 Benign neoplasm of anus and anal canal: Secondary | ICD-10-CM

## 2018-05-29 MED ORDER — SODIUM CHLORIDE 0.9 % IV SOLN: 500.0000 mL | Freq: Once | INTRAVENOUS | Status: DC

## 2018-05-29 NOTE — Patient Instructions (Signed)
YOU HAD AN ENDOSCOPIC PROCEDURE TODAY AT [facility]:   Refer to the procedure report that was given to you for any specific questions about what was found during the examination.  If the procedure report does not answer your questions, please call your gastroenterologist to clarify.  If you requested that your care partner not be given the details of your procedure findings, then the procedure report has been included in a sealed envelope for you to review at your convenience later.  YOU SHOULD EXPECT: Some feelings of bloating in the abdomen. Passage of more gas than usual.  Walking can help get rid of the air that was put into your GI tract during the procedure and reduce the bloating. If you had a lower endoscopy (such as a colonoscopy or flexible sigmoidoscopy) you may notice spotting of blood in your stool or on the toilet paper. If you underwent a bowel prep for your procedure, you may not have a normal bowel movement for a few days.  Please Note:  You might notice some irritation and congestion in your nose or some drainage.  This is from the oxygen used during your procedure.  There is no need for concern and it should clear up in a day or so.  SYMPTOMS TO REPORT IMMEDIATELY:   Following lower endoscopy (colonoscopy or flexible sigmoidoscopy):  Excessive amounts of blood in the stool  Significant tenderness or worsening of abdominal pains  Swelling of the abdomen that is new, acute  Fever of 100F or higher   For urgent or emergent issues, a gastroenterologist can be reached at any hour by calling [phone].   DIET:  We do recommend a small meal at first, but then you may proceed to your regular diet.  Drink plenty of fluids but you should avoid alcoholic beverages for 24 hours.  ACTIVITY:  You should plan to take it easy for the rest of today and you should NOT DRIVE or use heavy machinery until tomorrow (because of the sedation medicines used during the test).     FOLLOW UP: Our staff will call the number listed on your records the next business day following your procedure to check on you and address any questions or concerns that you may have regarding the information given to you following your procedure. If we do not reach you, we will leave a message.  However, if you are feeling well and you are not experiencing any problems, there is no need to return our call.  We will assume that you have returned to your regular daily activities without incident.  If any biopsies were taken you will be contacted by phone or by letter within the next 1-3 weeks.  Please call us at [phone] if you have not heard about the biopsies in 3 weeks.    SIGNATURES/CONFIDENTIALITY: You and/or your care partner have signed paperwork which will be entered into your electronic medical record.  These signatures attest to the fact that that the information above on your After Visit Summary has been reviewed and is understood.  Full responsibility of the confidentiality of this discharge information lies with you and/or your care-partner.  Polyps, diverticulosis, and hemorrrhoid information given.

## 2018-05-29 NOTE — Progress Notes (Signed)
error 

## 2018-05-29 NOTE — Op Note (Signed)
[facility] Patient Name: Robert Page Procedure Date: 05/29/2018 8:00 AM MRN: [id number] Endoscopist: Remo Lipps P. Havery Moros , MD Age: 72 Referring MD:  Date of Birth: June 24, 1946 Gender: Male Account #: [id number] Procedure:                Colonoscopy Indications:              Surveillance: Personal history of adenomatous                            polyps on last colonoscopy 5 years ago Medicines:                Monitored Anesthesia Care Procedure:                Pre-Anesthesia Assessment:                           - Prior to the procedure, a History and Physical                            was performed, and patient medications and                            allergies were reviewed. The patient's tolerance of                            previous anesthesia was also reviewed. The risks                            and benefits of the procedure and the sedation                            options and risks were discussed with the patient.                            All questions were answered, and informed consent                            was obtained. Prior Anticoagulants: The patient has                            taken no previous anticoagulant or antiplatelet                            agents. ASA Grade Assessment: II - A patient with                            mild systemic disease. After reviewing the risks                            and benefits, the patient was deemed in                            satisfactory condition to undergo the procedure.  After obtaining informed consent, the colonoscope                            was passed under direct vision. Throughout the                            procedure, the patient's blood pressure, pulse, and                            oxygen saturations were monitored continuously. The                            Colonoscope was introduced through the anus and                            advanced to the the  cecum, identified by                            appendiceal orifice and ileocecal valve. The                            colonoscopy was performed without difficulty. The                            patient tolerated the procedure well. The quality                            of the bowel preparation was adequate. The                            ileocecal valve, appendiceal orifice, and rectum                            were photographed. Scope In: 8:07:45 AM Scope Out: 8:21:56 AM Scope Withdrawal Time: 0 hours 12 minutes 15 seconds  Total Procedure Duration: 0 hours 14 minutes 11 seconds  Findings:                 The perianal and digital rectal examinations were                            normal.                           A 5 mm polyp was found in the ascending colon. The                            polyp was sessile. The polyp was removed with a                            cold snare. Resection and retrieval were complete.                           A 3 mm polyp was found in the recto-sigmoid colon.  The polyp was sessile. The polyp was removed with a                            cold snare. Resection and retrieval were complete.                           A 4 mm polyp was found in the rectum. The polyp was                            sessile. The polyp was removed with a cold snare.                            Resection and retrieval were complete.                           There was evidence of a prior end-to-end                            colo-colonic anastomosis in the sigmoid colon. This                            was patent and was characterized by healthy                            appearing mucosa.                           Many medium-mouthed diverticula were found in the                            entire colon.                           Internal hemorrhoids were found during retroflexion.                           The exam was otherwise without  abnormality. Complications:            No immediate complications. Estimated blood loss:                            Minimal. Estimated Blood Loss:     Estimated blood loss was minimal. Impression:               - One 5 mm polyp in the ascending colon, removed                            with a cold snare. Resected and retrieved.                           - One 3 mm polyp at the recto-sigmoid colon,                            removed with a cold snare. Resected and retrieved.                           -  One 4 mm polyp in the rectum, removed with a cold                            snare. Resected and retrieved.                           - Patent end-to-end colo-colonic anastomosis,                            characterized by healthy appearing mucosa.                           - Diverticulosis in the entire examined colon.                           - Internal hemorrhoids.                           - The examination was otherwise normal. Recommendation:           - Patient has a contact number available for                            emergencies. The signs and symptoms of potential                            delayed complications were discussed with the                            patient. Return to normal activities tomorrow.                            Written discharge instructions were provided to the                            patient.                           - Resume previous diet.                           - Continue present medications.                           - Await pathology results.                           - Repeat colonoscopy for surveillance based on                            pathology results. Remo Lipps P. Havery Moros, MD 05/29/2018 8:26:21 AM This report has been signed electronically.

## 2018-05-29 NOTE — Progress Notes (Signed)
Pt's states no medical or surgical changes since previsit or office visit. 

## 2018-05-29 NOTE — Progress Notes (Signed)
Entered in error

## 2018-05-29 NOTE — Progress Notes (Signed)
Alert and oriented x 3, pleased with MAC, report to RN

## 2018-05-29 NOTE — Progress Notes (Signed)
Called to room to assist during endoscopic procedure.  Patient ID and intended procedure confirmed with present staff. Received instructions for my participation in the procedure from the performing physician.  

## 2018-05-30 ENCOUNTER — Telehealth: Payer: Self-pay

## 2018-05-30 NOTE — Telephone Encounter (Signed)
  Follow up Call-  Call back number 05/29/2018  Post procedure Call Back phone  # [phone]  Permission to leave phone message Yes  Some recent data might be hidden     Patient questions:  Do you have a fever, pain , or abdominal swelling? No. Pain Score  0 *  Have you tolerated food without any problems? Yes.    Have you been able to return to your normal activities? Yes.    Do you have any questions about your discharge instructions: Diet   No. Medications  No. Follow up visit  No.  Do you have questions or concerns about your Care? No.  Actions: * If pain score is 4 or above: No action needed, pain <4.

## 2018-06-30 NOTE — Progress Notes (Signed)
Cardiology Office Note   Date:  07/03/2018   ID:  [name], DOB November 07, 1945, MRN [id number]  PCP:  Susy Frizzle, MD    No chief complaint on file.  SVT/DOE  Wt Readings from Last 3 Encounters:  07/03/18 216 lb 12.8 oz (98.3 kg)  05/29/18 217 lb (98.4 kg)  05/15/18 217 lb (98.4 kg)       History of Present Illness: Robert Page is a 72 y.o. male  Who has a past medical history significant for GERD, hypertension, atrial tachycardia.  The patient saw Dr. Rayann Heman in 2014 for nonsustained SVT suspected to be atrial tachycardia.  As of 12/2012 his symptoms had markedly improved.  He was treated briefly with diltiazem but with improvement in his symptoms he stopped that.  An echocardiogram on 10/30/2012 showed normal LV systolic function with a EF 65%, bilateral atrial dilatation.  He also had a low risk Myoview.  He declined consideration of ablation or addition of medications. He has had no significant palpitations since then.   He quit smoking cigars.  Echo in 4/19 for DOE showed: Left ventricle: The cavity size was normal. There was mild   concentric hypertrophy. Systolic function was normal. The   estimated ejection fraction was in the range of 60% to 65%. Wall   motion was normal; there were no regional wall motion   abnormalities. Left ventricular diastolic function parameters   were normal. - Aortic valve: Transvalvular velocity was within the normal range.   There was no stenosis. There was no regurgitation. - Aorta: Ascending aortic diameter: 37 mm (S). - Ascending aorta: The ascending aorta was mildly dilated. - Mitral valve: Transvalvular velocity was within the normal range.   There was no evidence for stenosis. There was trivial   regurgitation. - Right ventricle: The cavity size was normal. Wall thickness was   normal. Systolic function was normal. - Atrial septum: No defect or patent foramen ovale was identified   by color flow Doppler. - Tricuspid  valve: There was mild regurgitation. - Pulmonary arteries: Systolic pressure was within the normal   range. PA peak pressure: 25 mm Hg (S).  CT aorta in 4/19: 1. Normal caliber thoracic aorta without evidence of aneurysmal disease. Maximal caliber of the aorta is 3.5 cm at the level of the tubular ascending aorta. Mild atherosclerosis present at the level of the aortic arch. 2. Calcified plaque in the distribution of the LAD. 3. Stable 5 mm subpleural right lower lobe lung nodule. This nodule has been stable for nearly 3 years and is therefore benign.  Since the last visit, he has had perhaps 2 episodes of tachycardia.  THey last up to 5 seconds.  He is not bothered by it at all.    Denies : Chest pain. Dizziness. Leg edema. Nitroglycerin use. Orthopnea. Palpitations. Paroxysmal nocturnal dyspnea. Shortness of breath. Syncope.   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  He does a lot of lifting.  He has a nordic-trac, but has not been using it.  Foot pain is a limitation.   Past Medical History:  Diagnosis Date  . Adenomatous polyp   . Allergy   . Atrial tachycardia ([place])   . Diverticulosis   . Dupuytren's disease    palms and soles  . GERD (gastroesophageal reflux disease)   . Hypertension     Past Surgical History:  Procedure Laterality Date  . BACK SURGERY     12/2013  . CARDIAC CATHETERIZATION  1990's  . COLECTOMY  2010   for diverticulitis  . COLONOSCOPY  04/03/2013  . LASIK  2002   Bil  . POLYPECTOMY       Current Outpatient Medications  Medication Sig Dispense Refill  . amLODipine (NORVASC) 5 MG tablet TAKE 1 TABLET BY MOUTH EVERY DAY 90 tablet 1  . aspirin EC 81 MG tablet Take 81 mg by mouth daily.    . famotidine (PEPCID) 20 MG tablet Take 20 mg by mouth as needed.     . fexofenadine (ALLEGRA) 180 MG tablet Take 180 mg by mouth daily.     . hydrochlorothiazide (HYDRODIURIL) 25 MG tablet Take 1 tablet (25 mg total) by mouth daily. 90 tablet 3  . losartan  (COZAAR) 100 MG tablet TAKE 1 TABLET(100 MG) BY MOUTH DAILY 90 tablet 3  . omeprazole (PRILOSEC) 20 MG capsule Take 20 mg by mouth daily.     No current facility-administered medications for this visit.     Allergies:   Sulfamethoxazole; Sulfonamide derivatives; and Ibuprofen    Social History:  The patient  reports that he quit smoking about 4 years ago. His smoking use included cigars. He has never used smokeless tobacco. He reports that he does not drink alcohol or use drugs.   Family History:  The patient's family history includes Arthritis in his brother; Colon cancer in his paternal grandmother, paternal uncle, and unknown relative; Dementia in his mother; Heart disease in his cousin and unknown relative; Hypertension in his brother, father, and mother; Kidney disease in his mother and unknown relative; Liver cancer in his unknown relative; Lung cancer in his unknown relative; Macular degeneration in his mother; Melanoma in his sister; Prostate cancer in his paternal uncle and unknown relative; Stomach cancer in his cousin; Stroke in his unknown relative.    ROS:  Please see the history of present illness.   Otherwise, review of systems are positive for foot pain.   All other systems are reviewed and negative.    PHYSICAL EXAM: VS:  BP 138/72   Pulse 62   Ht 5' 9.5" (1.765 m)   Wt 216 lb 12.8 oz (98.3 kg)   SpO2 97%   BMI 31.56 kg/m  , BMI Body mass index is 31.56 kg/m. GEN: Well nourished, well developed, in no acute distress  HEENT: normal  Neck: no JVD, carotid bruits, or masses Cardiac: RRR; no murmurs, rubs, or gallops,no edema  Respiratory:  clear to auscultation bilaterally, normal work of breathing GI: soft, nontender, nondistended, + BS MS: no deformity or atrophy  Skin: warm and dry, no rash Neuro:  Strength and sensation are intact Psych: euthymic mood, full affect   EKG:   The ekg ordered in 3/19 demonstrates NSR, inferior ST changes   Recent  Labs: 10/29/2017: ALT 17; Hemoglobin 16.0; Platelets 269; TSH 1.11 12/12/2017: BUN 24; Creatinine, Ser 1.19; Potassium 4.1; Sodium 142   Lipid Panel    Component Value Date/Time   CHOL 167 11/19/2017 0853   TRIG 63 11/19/2017 0853   HDL 52 11/19/2017 0853   CHOLHDL 3.2 11/19/2017 0853   VLDL 15 11/13/2016 0854   LDLCALC 100 (H) 11/19/2017 0853     Other studies Reviewed: Additional studies/ records that were reviewed today with results demonstrating: labs reviewed .   ASSESSMENT AND PLAN:  1. DOE: Significantly better than the prior. Try to increase exercise regularly. 2. Coronary calcification: No anginal sx. COntinue aggressive secondary prevention. LDL 100 in 3/19. 3. SVT: Sx well  managed with medicines.  Sx are very mild, not interested in invasive management. 4. Carotid artery disease: Mild in 2018.  We spoke about avoiding processed foods and eating more of a plant based diet.    Current medicines are reviewed at length with the patient today.  The patient concerns regarding his medicines were addressed.  The following changes have been made:  No change  Labs/ tests ordered today include:  No orders of the defined types were placed in this encounter.   Recommend 150 minutes/week of aerobic exercise Low fat, low carb, high fiber diet recommended  Disposition:   FU in 1 year   Signed, Larae Grooms, MD  07/03/2018 8:17 AM    [facility] [address], [place], [place]  [address] Phone: [phone]; Fax: [phone]

## 2018-07-03 ENCOUNTER — Ambulatory Visit (INDEPENDENT_AMBULATORY_CARE_PROVIDER_SITE_OTHER): Payer: Medicare Other | Admitting: Interventional Cardiology

## 2018-07-03 ENCOUNTER — Encounter: Payer: Self-pay | Admitting: Interventional Cardiology

## 2018-07-03 VITALS — BP 138/72 | HR 62 | Ht 69.5 in | Wt 216.8 lb

## 2018-07-03 DIAGNOSIS — I2510 Atherosclerotic heart disease of native coronary artery without angina pectoris: Secondary | ICD-10-CM

## 2018-07-03 DIAGNOSIS — R0609 Other forms of dyspnea: Secondary | ICD-10-CM

## 2018-07-03 DIAGNOSIS — I6523 Occlusion and stenosis of bilateral carotid arteries: Secondary | ICD-10-CM

## 2018-07-03 DIAGNOSIS — I471 Supraventricular tachycardia: Secondary | ICD-10-CM

## 2018-07-03 NOTE — Patient Instructions (Signed)

## 2018-07-08 ENCOUNTER — Encounter (HOSPITAL_COMMUNITY): Payer: Self-pay | Admitting: *Deleted

## 2018-07-08 ENCOUNTER — Encounter (HOSPITAL_COMMUNITY): Payer: Self-pay | Admitting: Emergency Medicine

## 2018-07-08 ENCOUNTER — Ambulatory Visit (INDEPENDENT_AMBULATORY_CARE_PROVIDER_SITE_OTHER)
Admission: EM | Admit: 2018-07-08 | Discharge: 2018-07-08 | Disposition: A | Discharge status: Home / Self Care | Payer: Medicare Other | Source: Home / Self Care

## 2018-07-08 ENCOUNTER — Emergency Department (HOSPITAL_COMMUNITY)
Admission: EM | Admit: 2018-07-08 | Discharge: 2018-07-08 | Disposition: A | Discharge status: Home / Self Care | Payer: Medicare Other | Attending: Emergency Medicine | Admitting: Emergency Medicine

## 2018-07-08 ENCOUNTER — Other Ambulatory Visit: Payer: Self-pay

## 2018-07-08 ENCOUNTER — Ambulatory Visit (INDEPENDENT_AMBULATORY_CARE_PROVIDER_SITE_OTHER): Payer: Medicare Other

## 2018-07-08 DIAGNOSIS — Z7982 Long term (current) use of aspirin: Secondary | ICD-10-CM | POA: Insufficient documentation

## 2018-07-08 DIAGNOSIS — S59901A Unspecified injury of right elbow, initial encounter: Secondary | ICD-10-CM | POA: Diagnosis not present

## 2018-07-08 DIAGNOSIS — S4991XA Unspecified injury of right shoulder and upper arm, initial encounter: Secondary | ICD-10-CM

## 2018-07-08 DIAGNOSIS — I10 Essential (primary) hypertension: Secondary | ICD-10-CM | POA: Insufficient documentation

## 2018-07-08 DIAGNOSIS — Z87891 Personal history of nicotine dependence: Secondary | ICD-10-CM | POA: Diagnosis not present

## 2018-07-08 DIAGNOSIS — M79601 Pain in right arm: Secondary | ICD-10-CM | POA: Diagnosis not present

## 2018-07-08 DIAGNOSIS — Z79899 Other long term (current) drug therapy: Secondary | ICD-10-CM | POA: Diagnosis not present

## 2018-07-08 MED ORDER — NAPROXEN 500 MG PO TABS
500.0000 mg | ORAL_TABLET | Freq: Once | ORAL | Status: AC
  Administered 2018-07-08: 500 mg via ORAL
  Filled 2018-07-08: qty 1

## 2018-07-08 MED ORDER — HYDROCODONE-ACETAMINOPHEN 5-325 MG PO TABS: 2.0000 | ORAL_TABLET | ORAL | 0 refills | Status: AC | PRN

## 2018-07-08 MED ORDER — ACETAMINOPHEN 325 MG PO TABS
650.0000 mg | ORAL_TABLET | Freq: Once | ORAL | Status: DC
  Filled 2018-07-08: qty 2

## 2018-07-08 NOTE — ED Provider Notes (Addendum)
[place]    CSN: [id number] Arrival date & time: 07/08/18  1249     History   Chief Complaint Chief Complaint  Patient presents with  . Arm Injury    HPI Robert Page is a 72 y.o. male.   Robert Page presents with complaints of right arm pain. 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 In reaching, the arm of his hoodie he was wearing got caught on the spinning shaft's bolts, causing the hoodie to rapidly spin and tighten around the shaft, tightening like a tourniquet around his arm. No direct trauma or blow to his arm. His hoodie essentially tightened around his arm, primarily around elbow/antecubital space. He was able to cut his hoodie off to release it. He has had progressive pain since this to his inner arm- distal humerus and proximal forearm. Has not looked at his arm, wasn't sure if had any skin breakdown or bleeding. States too painful to even pull his shirt up his arm, requests it to be cut. He states he feels numbness and tingling to the palm of his hand. Pain 7/10. Not on any blood thinner. In moving his wrist it is painful up his forearm. No specific elbow or shoulder pain. He is right handed. Hx of htn, gerd, atrial tachycardia.     ROS per HPI.      Past Medical History:  Diagnosis Date  . Adenomatous polyp   . Allergy   . Atrial tachycardia ([place])   . Diverticulosis   . Dupuytren's disease    palms and soles  . GERD (gastroesophageal reflux disease)   . Hypertension     Patient Active Problem List   Diagnosis Date Noted  . Chest pain, atypical 11/27/2017  . Thyroid nodule 10/25/2015  . Cigarette smoker 04/20/2015  . Essential hypertension 03/14/2015  . Cough 03/14/2015  . Multiple pulmonary nodules 03/14/2015  . Lower respiratory tract infection 03/14/2015  . SVT (supraventricular tachycardia) ([place]) 11/03/2012  . Fatigue 11/03/2012  . GERD  (gastroesophageal reflux disease) 10/24/2012  . Plantar fasciitis 12/21/2010  . Routine general medical examination at a health care facility 12/16/2010  . Prostate cancer screening 12/16/2010  . SHOULDER PAIN, LEFT 11/26/2009  . HAND PAIN, BILATERAL 11/26/2009  . PERSONAL HX COLONIC POLYPS 10/26/2008  . EXTERNAL HEMORRHOIDS 09/23/2008  . HYPERTENSION 01/01/2007  . ALLERGIC RHINITIS 01/01/2007  . DIVERTICULOSIS, COLON 01/01/2007  . DIVERTICULITIS, HX OF 01/01/2007    Past Surgical History:  Procedure Laterality Date  . BACK SURGERY     12/2013  . CARDIAC CATHETERIZATION  1990's  . COLECTOMY  2010   for diverticulitis  . COLONOSCOPY  04/03/2013  . LASIK  2002   Bil  . POLYPECTOMY         Home Medications    Prior to Admission medications   Medication Sig Start Date End Date Taking? Authorizing Provider  amLODipine (NORVASC) 5 MG tablet TAKE 1 TABLET BY MOUTH EVERY DAY 05/29/18   Susy Frizzle, MD  aspirin EC 81 MG tablet Take 81 mg by mouth daily.    [provider]  famotidine (PEPCID) 20 MG tablet Take 20 mg by mouth as needed.     [provider]  fexofenadine (ALLEGRA) 180 MG tablet Take 180 mg by mouth daily.     [provider]  hydrochlorothiazide (HYDRODIURIL) 25 MG tablet Take 1 tablet (25 mg total) by mouth daily. 10/29/17  Susy Frizzle, MD  losartan (COZAAR) 100 MG tablet TAKE 1 TABLET(100 MG) BY MOUTH DAILY 02/12/18   Susy Frizzle, MD  omeprazole (PRILOSEC) 20 MG capsule Take 20 mg by mouth daily.    [provider]    Family History Family History  Problem Relation Age of Onset  . Hypertension Mother   . Macular degeneration Mother   . Kidney disease Mother   . Dementia Mother   . Hypertension Father   . Melanoma Sister   . Stroke Unknown        uncle  . Prostate cancer Unknown        uncle  . Colon cancer Unknown        grandmother/uncle  . Kidney disease Unknown        grandmother  . Liver cancer  Unknown        uncle (mets)  . Heart disease Unknown        aunt/uncle  . Lung cancer Unknown        uncle  . Stomach cancer Cousin   . Heart disease Cousin   . Colon cancer Paternal Grandmother   . Colon cancer Paternal Uncle   . Prostate cancer Paternal Uncle   . Arthritis Brother   . Hypertension Brother   . Esophageal cancer Neg Hx   . Rectal cancer Neg Hx     Social History Social History   Tobacco Use  . Smoking status: Former Smoker    Types: Cigars    Last attempt to quit: 2015    Years since quitting: 4.8  . Smokeless tobacco: Never Used  . Tobacco comment: Pt states smokes occasional cigar.  This is typically < 1 time per month  Substance Use Topics  . Alcohol use: No    Alcohol/week: 0.0 standard drinks  . Drug use: No     Allergies   Sulfamethoxazole; Sulfonamide derivatives; and Ibuprofen   Review of Systems Review of Systems   Physical Exam Triage Vital Signs ED Triage Vitals  Enc Vitals Group     BP 07/08/18 1407 (!) 152/75     Pulse Rate 07/08/18 1407 (!) 57     Resp 07/08/18 1407 18     Temp 07/08/18 1407 97.6 F (36.4 C)     Temp Source 07/08/18 1407 Oral     SpO2 07/08/18 1407 97 %     Weight --      Height --      Head Circumference --      Peak Flow --      Pain Score 07/08/18 1409 7     Pain Loc --      Pain Edu? --      Excl. in [place]? --    No data found.  Updated Vital Signs BP (!) 152/75 (BP Location: Left Arm)   Pulse (!) 57   Temp 97.6 F (36.4 C) (Oral)   Resp 18   SpO2 97%    Physical Exam  Constitutional: He is oriented to person, place, and time. He appears well-developed and well-nourished.  Cardiovascular: Regular rhythm.  Pulmonary/Chest: Effort normal and breath sounds normal.  Musculoskeletal:       Right elbow: He exhibits decreased range of motion and swelling. He exhibits no effusion, no deformity and no laceration. Tenderness found.       Arms: Right antecubital with large amount of swelling and soft  tissue swelling, skin abrasions, no lacerations; no elbow bony tenderness; tender to proximal  forearm and distal bicep; strong radial pulse; slight decrease in cap refill to left fingers of hand but gross sensation intact; states decreased sensation to palm of hand, not to dorsum of hand  Neurological: He is alert and oriented to person, place, and time.  Skin: Skin is warm and dry.     UC Treatments / Results  Labs (all labs ordered are listed, but only abnormal results are displayed) Labs Reviewed - No data to display  EKG None  Radiology Dg Elbow Complete Right  Result Date: 07/08/2018 CLINICAL DATA:  Right elbow injury EXAM: RIGHT ELBOW - COMPLETE 3+ VIEW COMPARISON:  03/11/2013 FINDINGS: Negative for fracture. Normal joint space. Mild calcaneal spurring. IMPRESSION: Negative. Electronically Signed   By: Franchot Gallo M.D.   On: 07/08/2018 14:55    Procedures Procedures (including critical care time)  Medications Ordered in UC Medications - No data to display  Initial Impression / Assessment and Plan / UC Course  I have reviewed the triage vital signs and the nursing notes.  Pertinent labs & imaging results that were available during my care of the patient were reviewed by me and considered in my medical decision making (see chart for details).     No bony findings. Appears soft tissue related injury. Tourniquet strangulation type injury to right arm with progressive pain and decrease in sensation. Concern about potential compartment syndrome. Discussed this with patient. Recommended further evaluation in the ER at this time. Sling and ice provided prior to departure, safe for self transport. Patient verbalized understanding and agreeable to plan.   Final Clinical Impressions(s) / UC Diagnoses   Final diagnoses:  Arm injury, right, initial encounter     Discharge Instructions     I am concerned about possible compartment syndrome due to progression of pain to your arm  with numbness sensation to your hand.  I think it is safest to be evaluated in the Er.     ED Prescriptions    None     Controlled Substance Prescriptions [place] Controlled Substance Registry consulted? Not Applicable   Zigmund Gottron, NP 07/08/18 1508    Zigmund Gottron, NP 07/08/18 [phone]

## 2018-07-08 NOTE — Discharge Instructions (Signed)
I have provided medication to help with your symptoms, please take for severe pain. If you experience any worsening in symptoms, pain out of proportion of numbness to your right arm please return to the ED.Otherwise follow up with PCP as needed.

## 2018-07-08 NOTE — ED Triage Notes (Addendum)
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 Pt has arm in sling with ice pack from UC.  Pt eating [employer] while in lobby

## 2018-07-08 NOTE — ED Provider Notes (Signed)
[facility] DEPT Provider Note   CSN: [id number] Arrival date & time: 07/08/18  1522     History   Chief Complaint Chief Complaint  Patient presents with  . Arm Injury    HPI Robert Page is a 72 y.o. male.  72 y.o male with a PMH of Atrial tachycardia, HTN, Dupuytrens disease on palms and soles presents to the ED sent by UC with a chief complaint of right arm injury. 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 He reports pain along the elbow region worse with extension and flexion. Patient was seen at the Urgent care and sent in for further evaluation. He taking any therapy for his pain. He denies an other injury or complaints.      Past Medical History:  Diagnosis Date  . Adenomatous polyp   . Allergy   . Atrial tachycardia ([place])   . Diverticulosis   . Dupuytren's disease    palms and soles  . GERD (gastroesophageal reflux disease)   . Hypertension     Patient Active Problem List   Diagnosis Date Noted  . Chest pain, atypical 11/27/2017  . Thyroid nodule 10/25/2015  . Cigarette smoker 04/20/2015  . Essential hypertension 03/14/2015  . Cough 03/14/2015  . Multiple pulmonary nodules 03/14/2015  . Lower respiratory tract infection 03/14/2015  . SVT (supraventricular tachycardia) ([place]) 11/03/2012  . Fatigue 11/03/2012  . GERD (gastroesophageal reflux disease) 10/24/2012  . Plantar fasciitis 12/21/2010  . Routine general medical examination at a health care facility 12/16/2010  . Prostate cancer screening 12/16/2010  . SHOULDER PAIN, LEFT 11/26/2009  . HAND PAIN, BILATERAL 11/26/2009  . PERSONAL HX COLONIC POLYPS 10/26/2008  . EXTERNAL HEMORRHOIDS 09/23/2008  . HYPERTENSION 01/01/2007  . ALLERGIC RHINITIS 01/01/2007  . DIVERTICULOSIS, COLON 01/01/2007  . DIVERTICULITIS, HX OF 01/01/2007    Past Surgical History:  Procedure Laterality Date  . BACK SURGERY     12/2013  . CARDIAC  CATHETERIZATION  1990's  . COLECTOMY  2010   for diverticulitis  . COLONOSCOPY  04/03/2013  . LASIK  2002   Bil  . POLYPECTOMY          Home Medications    Prior to Admission medications   Medication Sig Start Date End Date Taking? Authorizing Provider  amLODipine (NORVASC) 5 MG tablet TAKE 1 TABLET BY MOUTH EVERY DAY 05/29/18  Yes Susy Frizzle, MD  aspirin EC 81 MG tablet Take 81 mg by mouth daily.   Yes [provider]  fexofenadine (ALLEGRA) 180 MG tablet Take 180 mg by mouth daily.    Yes [provider]  hydrochlorothiazide (HYDRODIURIL) 25 MG tablet Take 1 tablet (25 mg total) by mouth daily. 10/29/17  Yes Susy Frizzle, MD  losartan (COZAAR) 100 MG tablet TAKE 1 TABLET(100 MG) BY MOUTH DAILY 02/12/18  Yes Susy Frizzle, MD  omeprazole (PRILOSEC) 20 MG capsule Take 20 mg by mouth daily.   Yes [provider]  famotidine (PEPCID) 20 MG tablet Take 20 mg by mouth daily as needed for heartburn.     [provider]  HYDROcodone-acetaminophen (NORCO/VICODIN) 5-325 MG tablet Take 2 tablets by mouth every 4 (four) hours as needed for up to 3 days. 07/08/18 07/11/18  Janeece Fitting, PA-C    Family History Family History  Problem Relation Age of Onset  . Hypertension Mother   . Macular degeneration Mother   . Kidney disease Mother   .  Dementia Mother   . Hypertension Father   . Melanoma Sister   . Stroke Unknown        uncle  . Prostate cancer Unknown        uncle  . Colon cancer Unknown        grandmother/uncle  . Kidney disease Unknown        grandmother  . Liver cancer Unknown        uncle (mets)  . Heart disease Unknown        aunt/uncle  . Lung cancer Unknown        uncle  . Stomach cancer Cousin   . Heart disease Cousin   . Colon cancer Paternal Grandmother   . Colon cancer Paternal Uncle   . Prostate cancer Paternal Uncle   . Arthritis Brother   . Hypertension Brother   . Esophageal cancer Neg Hx   . Rectal cancer  Neg Hx     Social History Social History   Tobacco Use  . Smoking status: Former Smoker    Types: Cigars    Last attempt to quit: 2015    Years since quitting: 4.8  . Smokeless tobacco: Never Used  . Tobacco comment: Pt states smokes occasional cigar.  This is typically < 1 time per month  Substance Use Topics  . Alcohol use: No    Alcohol/week: 0.0 standard drinks  . Drug use: No     Allergies   Sulfamethoxazole; Sulfonamide derivatives; and Ibuprofen   Review of Systems Review of Systems  Constitutional: Negative for chills and fever.  Respiratory: Negative for shortness of breath.   Cardiovascular: Negative for chest pain.  Gastrointestinal: Negative for abdominal pain.  Genitourinary: Negative for flank pain.  Musculoskeletal: Positive for arthralgias and myalgias. Negative for joint swelling.  Neurological: Negative for headaches.     Physical Exam Updated Vital Signs BP (!) 154/87 (BP Location: Left Arm)   Pulse (!) 55   Temp 97.8 F (36.6 C) (Oral)   Resp 18   SpO2 98%   Physical Exam  Constitutional: He is oriented to person, place, and time. He appears well-developed and well-nourished.  HENT:  Head: Normocephalic and atraumatic.  Neck: Normal range of motion. Neck supple.  Cardiovascular: Normal heart sounds.  Pulmonary/Chest: Breath sounds normal.  Abdominal: Soft.  Musculoskeletal: He exhibits edema and tenderness. He exhibits no deformity.       Right elbow: He exhibits swelling. He exhibits normal range of motion, no effusion, no deformity and no laceration. Tenderness found. No medial epicondyle tenderness noted.       Arms: Neurological: He is alert and oriented to person, place, and time.  Skin: Skin is warm and dry.  Nursing note and vitals reviewed.    ED Treatments / Results  Labs (all labs ordered are listed, but only abnormal results are displayed) Labs Reviewed - No data to display  EKG None  Radiology Dg Elbow Complete  Right  Result Date: 07/08/2018 CLINICAL DATA:  Right elbow injury EXAM: RIGHT ELBOW - COMPLETE 3+ VIEW COMPARISON:  03/11/2013 FINDINGS: Negative for fracture. Normal joint space. Mild calcaneal spurring. IMPRESSION: Negative. Electronically Signed   By: Franchot Gallo M.D.   On: 07/08/2018 14:55    Procedures Procedures (including critical care time)  Medications Ordered in ED Medications  naproxen (NAPROSYN) tablet 500 mg (500 mg Oral Given 07/08/18 1944)     Initial Impression / Assessment and Plan / ED Course  I have reviewed the triage vital  signs and the nursing notes.  Pertinent labs & imaging results that were available during my care of the patient were reviewed by me and considered in my medical decision making (see chart for details).    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 Xray done while in the UC was negative for fracture or dislocation.  During examination patient wearing a sling and ice pack to arm, there is swelling along with bruising but no obvious deformity. His compartments are soft, pulses are intact and good capillary refill.  I have discussed this patient by Dr. Wilson Singer who has also evaluated this patient.  At this time we will send patient home with pain medication and follow-up with PCP in 1 week.  Suspicion for compartment syndrome as we have apartments are soft, the swelling is localized to the right elbow on the antecubital region.  Vitals stable during ED visit, patient stable for discharge.  Final Clinical Impressions(s) / ED Diagnoses   Final diagnoses:  Right arm pain    ED Discharge Orders         Ordered    HYDROcodone-acetaminophen (NORCO/VICODIN) 5-325 MG tablet  Every 4 hours PRN     07/08/18 1946           Janeece Fitting, Hershal Coria 07/08/18 1948    Virgel Manifold, MD 07/18/18 [phone]

## 2018-07-08 NOTE — ED Triage Notes (Signed)
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

## 2018-07-08 NOTE — Discharge Instructions (Signed)
I am concerned about possible compartment syndrome due to progression of pain to your arm with numbness sensation to your hand.  I think it is safest to be evaluated in the Er.

## 2018-07-16 DIAGNOSIS — S46291A Other injury of muscle, fascia and tendon of other parts of biceps, right arm, initial encounter: Secondary | ICD-10-CM | POA: Diagnosis not present

## 2018-07-16 DIAGNOSIS — M25521 Pain in right elbow: Secondary | ICD-10-CM | POA: Diagnosis not present

## 2018-07-26 DIAGNOSIS — M25521 Pain in right elbow: Secondary | ICD-10-CM | POA: Diagnosis not present

## 2018-07-31 ENCOUNTER — Encounter: Payer: Self-pay | Admitting: Family Medicine

## 2018-07-31 DIAGNOSIS — D2272 Melanocytic nevi of left lower limb, including hip: Secondary | ICD-10-CM | POA: Diagnosis not present

## 2018-07-31 DIAGNOSIS — L918 Other hypertrophic disorders of the skin: Secondary | ICD-10-CM | POA: Diagnosis not present

## 2018-07-31 DIAGNOSIS — D2261 Melanocytic nevi of right upper limb, including shoulder: Secondary | ICD-10-CM | POA: Diagnosis not present

## 2018-07-31 DIAGNOSIS — D2239 Melanocytic nevi of other parts of face: Secondary | ICD-10-CM | POA: Diagnosis not present

## 2018-07-31 DIAGNOSIS — D225 Melanocytic nevi of trunk: Secondary | ICD-10-CM | POA: Diagnosis not present

## 2018-07-31 DIAGNOSIS — D485 Neoplasm of uncertain behavior of skin: Secondary | ICD-10-CM | POA: Diagnosis not present

## 2018-07-31 DIAGNOSIS — C44311 Basal cell carcinoma of skin of nose: Secondary | ICD-10-CM | POA: Diagnosis not present

## 2018-07-31 DIAGNOSIS — D2271 Melanocytic nevi of right lower limb, including hip: Secondary | ICD-10-CM | POA: Diagnosis not present

## 2018-07-31 DIAGNOSIS — D1801 Hemangioma of skin and subcutaneous tissue: Secondary | ICD-10-CM | POA: Diagnosis not present

## 2018-07-31 DIAGNOSIS — L308 Other specified dermatitis: Secondary | ICD-10-CM | POA: Diagnosis not present

## 2018-07-31 DIAGNOSIS — L821 Other seborrheic keratosis: Secondary | ICD-10-CM | POA: Diagnosis not present

## 2018-07-31 DIAGNOSIS — D2262 Melanocytic nevi of left upper limb, including shoulder: Secondary | ICD-10-CM | POA: Diagnosis not present

## 2018-07-31 DIAGNOSIS — L910 Hypertrophic scar: Secondary | ICD-10-CM | POA: Diagnosis not present

## 2018-08-06 DIAGNOSIS — M25521 Pain in right elbow: Secondary | ICD-10-CM | POA: Diagnosis not present

## 2018-08-12 DIAGNOSIS — N4341 Spermatocele of epididymis, single: Secondary | ICD-10-CM | POA: Diagnosis not present

## 2018-08-26 DIAGNOSIS — N4341 Spermatocele of epididymis, single: Secondary | ICD-10-CM | POA: Diagnosis not present

## 2018-09-17 DIAGNOSIS — M25521 Pain in right elbow: Secondary | ICD-10-CM | POA: Diagnosis not present

## 2018-09-17 DIAGNOSIS — S5001XD Contusion of right elbow, subsequent encounter: Secondary | ICD-10-CM | POA: Diagnosis not present

## 2018-09-19 ENCOUNTER — Encounter: Payer: Self-pay | Admitting: Family Medicine

## 2018-09-19 ENCOUNTER — Ambulatory Visit (INDEPENDENT_AMBULATORY_CARE_PROVIDER_SITE_OTHER): Payer: Medicare Other | Admitting: Family Medicine

## 2018-09-19 VITALS — BP 140/70 | HR 80 | Temp 97.8°F | Resp 14 | Ht 69.5 in | Wt 230.0 lb

## 2018-09-19 DIAGNOSIS — R6889 Other general symptoms and signs: Secondary | ICD-10-CM

## 2018-09-19 DIAGNOSIS — J069 Acute upper respiratory infection, unspecified: Secondary | ICD-10-CM

## 2018-09-19 LAB — INFLUENZA A AND B AG, IMMUNOASSAY
INFLUENZA A ANTIGEN: NOT DETECTED
INFLUENZA B ANTIGEN: NOT DETECTED

## 2018-09-19 MED ORDER — HYDROCODONE-HOMATROPINE 5-1.5 MG/5ML PO SYRP: 5.0000 mL | ORAL_SOLUTION | Freq: Three times a day (TID) | ORAL | 0 refills | Status: DC | PRN

## 2018-09-19 NOTE — Progress Notes (Signed)
Subjective:    Patient ID: Robert Page, male    DOB: 07/14/1946, 73 y.o.   MRN: [id number]  HPI  Patient got sick yesterday.  Symptoms include sore throat, headache, and a nonproductive cough.  Sick contacts include some of his grandchildren.  He denies any fever.  He denies any sinus pain.  He denies any purulent sputum or hemoptysis. Past Medical History:  Diagnosis Date  . Adenomatous polyp   . Allergy   . Atrial tachycardia ([place])   . Diverticulosis   . Dupuytren's disease    palms and soles  . GERD (gastroesophageal reflux disease)   . Hypertension    Past Surgical History:  Procedure Laterality Date  . BACK SURGERY     12/2013  . CARDIAC CATHETERIZATION  1990's  . COLECTOMY  2010   for diverticulitis  . COLONOSCOPY  04/03/2013  . LASIK  2002   Bil  . POLYPECTOMY     Current Outpatient Medications on File Prior to Visit  Medication Sig Dispense Refill  . amLODipine (NORVASC) 5 MG tablet TAKE 1 TABLET BY MOUTH EVERY DAY 90 tablet 1  . aspirin EC 81 MG tablet Take 81 mg by mouth daily.    . famotidine (PEPCID) 20 MG tablet Take 20 mg by mouth daily as needed for heartburn.     . fexofenadine (ALLEGRA) 180 MG tablet Take 180 mg by mouth daily.     . hydrochlorothiazide (HYDRODIURIL) 25 MG tablet Take 1 tablet (25 mg total) by mouth daily. 90 tablet 3  . losartan (COZAAR) 100 MG tablet TAKE 1 TABLET(100 MG) BY MOUTH DAILY 90 tablet 3  . meloxicam (MOBIC) 7.5 MG tablet Take 7.5 mg by mouth 2 (two) times daily.    Marland Kitchen omeprazole (PRILOSEC) 20 MG capsule Take 20 mg by mouth daily.     No current facility-administered medications on file prior to visit.    Allergies  Allergen Reactions  . Sulfamethoxazole Other (See Comments)    other  . Sulfonamide Derivatives     REACTION: itch  . Ibuprofen Other (See Comments)    REACTION: stomach cramps other   Social History   Socioeconomic History  . Marital status: Married    Spouse name: Darlene Bartelt  . Number of  children: Not on file  . Years of education: Not on file  . Highest education level: Not on file  Occupational History  . Occupation: retired  Scientific laboratory technician  . Financial resource strain: Not on file  . Food insecurity:    Worry: Not on file    Inability: Not on file  . Transportation needs:    Medical: Not on file    Non-medical: Not on file  Tobacco Use  . Smoking status: Former Smoker    Types: Cigars    Last attempt to quit: 2015    Years since quitting: 5.0  . Smokeless tobacco: Never Used  . Tobacco comment: Pt states smokes occasional cigar.  This is typically < 1 time per month  Substance and Sexual Activity  . Alcohol use: No    Alcohol/week: 0.0 standard drinks  . Drug use: No  . Sexual activity: Not on file  Lifestyle  . Physical activity:    Days per week: Not on file    Minutes per session: Not on file  . Stress: Not on file  Relationships  . Social connections:    Talks on phone: Not on file    Gets together: Not on file  Attends religious service: Not on file    Active member of club or organization: Not on file    Attends meetings of clubs or organizations: Not on file    Relationship status: Not on file  . Intimate partner violence:    Fear of current or ex partner: Not on file    Emotionally abused: Not on file    Physically abused: Not on file    Forced sexual activity: Not on file  Other Topics Concern  . Not on file  Social History Narrative   Pt lives in [place].  Retired from [employer] and [employer].  Goes on mission trips to [place], gets regular exercise.     Review of Systems  All other systems reviewed and are negative.      Objective:   Physical Exam Vitals signs reviewed.  Constitutional:      General: He is not in acute distress.    Appearance: Normal appearance. He is normal weight. He is not ill-appearing, toxic-appearing or diaphoretic.  HENT:     Head: Normocephalic and atraumatic.     Right Ear: Tympanic membrane and ear  canal normal.     Left Ear: Tympanic membrane and ear canal normal.     Nose: Congestion and rhinorrhea present.     Mouth/Throat:     Pharynx: No oropharyngeal exudate or posterior oropharyngeal erythema.  Eyes:     Conjunctiva/sclera: Conjunctivae normal.  Cardiovascular:     Rate and Rhythm: Normal rate and regular rhythm.     Heart sounds: Normal heart sounds. No murmur.  Pulmonary:     Effort: Pulmonary effort is normal.     Breath sounds: Normal breath sounds. No wheezing, rhonchi or rales.  Abdominal:     General: Bowel sounds are normal.     Palpations: Abdomen is soft.     Tenderness: There is no abdominal tenderness.  Lymphadenopathy:     Cervical: No cervical adenopathy.  Neurological:     Mental Status: He is alert.           Assessment & Plan:  URI, acute - Plan: HYDROcodone-homatropine (HYCODAN) 5-1.5 MG/5ML syrup, CANCELED: Influenza A+B Ag, EIA  Flu-like symptoms - Plan: Influenza A and B Ag, Immunoassay, HYDROcodone-homatropine (HYCODAN) 5-1.5 MG/5ML syrup  Patient symptoms are consistent with a viral upper respiratory infection.  His flu test is negative.  For believe this is a severe head cold.  I have recommended symptomatic therapy only.  He can use Coricidin HBP over-the-counter for head congestion.  He can use Mucinex for cough and chest congestion.  I will give him Hycodan 1 teaspoon every 8 hours as needed for severe cough.  He can use Chloraseptic and ibuprofen for sore throat.

## 2018-09-20 DIAGNOSIS — C44311 Basal cell carcinoma of skin of nose: Secondary | ICD-10-CM | POA: Diagnosis not present

## 2018-11-15 ENCOUNTER — Other Ambulatory Visit: Payer: Self-pay | Admitting: *Deleted

## 2018-11-15 MED ORDER — AMLODIPINE BESYLATE 5 MG PO TABS: 5.0000 mg | ORAL_TABLET | Freq: Every day | ORAL | 1 refills | Status: DC

## 2018-11-20 ENCOUNTER — Other Ambulatory Visit: Payer: Self-pay | Admitting: Family Medicine

## 2018-11-20 DIAGNOSIS — I10 Essential (primary) hypertension: Secondary | ICD-10-CM

## 2018-12-19 ENCOUNTER — Encounter: Payer: Self-pay | Admitting: Family Medicine

## 2018-12-19 DIAGNOSIS — C4442 Squamous cell carcinoma of skin of scalp and neck: Secondary | ICD-10-CM | POA: Diagnosis not present

## 2018-12-19 DIAGNOSIS — L84 Corns and callosities: Secondary | ICD-10-CM | POA: Diagnosis not present

## 2018-12-19 DIAGNOSIS — D485 Neoplasm of uncertain behavior of skin: Secondary | ICD-10-CM | POA: Diagnosis not present

## 2018-12-19 DIAGNOSIS — Z85828 Personal history of other malignant neoplasm of skin: Secondary | ICD-10-CM | POA: Diagnosis not present

## 2019-01-21 DIAGNOSIS — N401 Enlarged prostate with lower urinary tract symptoms: Secondary | ICD-10-CM | POA: Diagnosis not present

## 2019-01-21 DIAGNOSIS — N3943 Post-void dribbling: Secondary | ICD-10-CM | POA: Diagnosis not present

## 2019-01-21 DIAGNOSIS — N5201 Erectile dysfunction due to arterial insufficiency: Secondary | ICD-10-CM | POA: Diagnosis not present

## 2019-02-07 ENCOUNTER — Other Ambulatory Visit: Payer: Self-pay | Admitting: Family Medicine

## 2019-02-11 ENCOUNTER — Other Ambulatory Visit: Payer: Self-pay | Admitting: Family Medicine

## 2019-03-04 ENCOUNTER — Other Ambulatory Visit: Payer: Self-pay | Admitting: Family Medicine

## 2019-03-17 ENCOUNTER — Ambulatory Visit (INDEPENDENT_AMBULATORY_CARE_PROVIDER_SITE_OTHER): Payer: Medicare Other | Admitting: Family Medicine

## 2019-03-17 ENCOUNTER — Encounter: Payer: Self-pay | Admitting: Family Medicine

## 2019-03-17 ENCOUNTER — Other Ambulatory Visit: Payer: Self-pay

## 2019-03-17 VITALS — BP 142/72 | HR 62 | Temp 98.0°F | Resp 16 | Ht 69.5 in | Wt 214.0 lb

## 2019-03-17 DIAGNOSIS — G609 Hereditary and idiopathic neuropathy, unspecified: Secondary | ICD-10-CM

## 2019-03-17 DIAGNOSIS — R7309 Other abnormal glucose: Secondary | ICD-10-CM | POA: Diagnosis not present

## 2019-03-17 MED ORDER — GABAPENTIN 300 MG PO CAPS: 300.0000 mg | ORAL_CAPSULE | Freq: Three times a day (TID) | ORAL | 3 refills | Status: DC

## 2019-03-17 NOTE — Progress Notes (Signed)
Subjective:    Patient ID: Robert Page, male    DOB: [birth year], 73 y.o.   MRN: [id number]  HPI Patient reports pain in both feet.  He describes the pain as a burning sensation.  It encompasses the midfoot and toes on both feet.  They always feel hot.  They ache and throb for no reason.  He also reports numbness and decreased sensation.  He has normal sensation to 10 g monofilament although he is more sensitive in his left foot than right foot.  He has normal sensation to cold.  He has normal sensation to vibration.  Reflexes are 2/4 equal and symmetric at the knee as well as at the ankle.  Muscle strength is 5/5 equal and symmetric in both legs.  He does have a history of surgery to remove soft tissue polyps from the side of his left foot.  He also has a hard bony process developing on the lateral aspect of his fifth metatarsal on both feet that is tender to palpation.  He has an appointment to see a podiatrist later this month to discuss this  Past Medical History:  Diagnosis Date  . Adenomatous polyp   . Allergy   . Atrial tachycardia ([place])   . Diverticulosis   . Dupuytren's disease    palms and soles  . GERD (gastroesophageal reflux disease)   . Hypertension    Past Surgical History:  Procedure Laterality Date  . BACK SURGERY     12/2013  . CARDIAC CATHETERIZATION  1990's  . COLECTOMY  2010   for diverticulitis  . COLONOSCOPY  04/03/2013  . LASIK  2002   [place]  . POLYPECTOMY     Current Outpatient Medications on File Prior to Visit  Medication Sig Dispense Refill  . amLODipine (NORVASC) 5 MG tablet TAKE 1 TABLET BY MOUTH EVERY DAY 90 tablet 1  . aspirin EC 81 MG tablet Take 81 mg by mouth daily.    . famotidine (PEPCID) 20 MG tablet Take 20 mg by mouth daily as needed for heartburn.     . fexofenadine (ALLEGRA) 180 MG tablet Take 180 mg by mouth daily.     . hydrochlorothiazide (HYDRODIURIL) 25 MG tablet TAKE 1 TABLET BY MOUTH EVERY DAY 90 tablet 3  .  HYDROcodone-homatropine (HYCODAN) 5-1.5 MG/5ML syrup Take 5 mLs by mouth every 8 (eight) hours as needed for cough. 120 mL 0  . losartan (COZAAR) 100 MG tablet TAKE 1 TABLET BY MOUTH DAILY 30 tablet 0  . meloxicam (MOBIC) 7.5 MG tablet Take 7.5 mg by mouth 2 (two) times daily.    Marland Kitchen omeprazole (PRILOSEC) 20 MG capsule Take 20 mg by mouth daily.     No current facility-administered medications on file prior to visit.    Allergies  Allergen Reactions  . Sulfamethoxazole Other (See Comments)    other  . Sulfonamide Derivatives     REACTION: itch  . Ibuprofen Other (See Comments)    REACTION: stomach cramps other   Social History   Socioeconomic History  . Marital status: Married    Spouse name: Grey Rakestraw  . Number of children: Not on file  . Years of education: Not on file  . Highest education level: Not on file  Occupational History  . Occupation: retired  Scientific laboratory technician  . Financial resource strain: Not on file  . Food insecurity    Worry: Not on file    Inability: Not on file  . Transportation needs  Medical: Not on file    Non-medical: Not on file  Tobacco Use  . Smoking status: Former Smoker    Types: Cigars    Quit date: 2015    Years since quitting: 5.5  . Smokeless tobacco: Never Used  . Tobacco comment: Pt states smokes occasional cigar.  This is typically < 1 time per month  Substance and Sexual Activity  . Alcohol use: No    Alcohol/week: 0.0 standard drinks  . Drug use: No  . Sexual activity: Not on file  Lifestyle  . Physical activity    Days per week: Not on file    Minutes per session: Not on file  . Stress: Not on file  Relationships  . Social Herbalist on phone: Not on file    Gets together: Not on file    Attends religious service: Not on file    Active member of club or organization: Not on file    Attends meetings of clubs or organizations: Not on file    Relationship status: Not on file  . Intimate partner violence    Fear  of current or ex partner: Not on file    Emotionally abused: Not on file    Physically abused: Not on file    Forced sexual activity: Not on file  Other Topics Concern  . Not on file  Social History Narrative   Pt lives in [place].  Retired from [employer] and [employer].  Goes on mission trips to [place], gets regular exercise.     Review of Systems  All other systems reviewed and are negative.      Objective:   Physical Exam Vitals signs reviewed.  Constitutional:      General: He is not in acute distress.    Appearance: Normal appearance. He is normal weight. He is not ill-appearing, toxic-appearing or diaphoretic.  HENT:     Head: Normocephalic and atraumatic.     Mouth/Throat:     Pharynx: No oropharyngeal exudate or posterior oropharyngeal erythema.  Eyes:     Conjunctiva/sclera: Conjunctivae normal.  Cardiovascular:     Rate and Rhythm: Normal rate and regular rhythm.     Heart sounds: Normal heart sounds. No murmur.  Pulmonary:     Effort: Pulmonary effort is normal.     Breath sounds: Normal breath sounds. No wheezing, rhonchi or rales.  Abdominal:     General: Bowel sounds are normal.     Palpations: Abdomen is soft.     Tenderness: There is no abdominal tenderness.  Musculoskeletal:     Left foot: Normal range of motion. Tenderness, bony tenderness and deformity present.       Feet:  Lymphadenopathy:     Cervical: No cervical adenopathy.  Neurological:     Mental Status: He is alert.     Sensory: Sensation is intact.     Motor: Motor function is intact.     Coordination: Coordination is intact.     Gait: Gait is intact.     Deep Tendon Reflexes: Reflexes are normal and symmetric.           Assessment & Plan:  1. Idiopathic peripheral neuropathy I suspect a large component of the patient's pain is due to peripheral neuropathy.  Check a TSH, vitamin B12 level, and a BMP.  If lab work is normal, try the patient on gabapentin 300 mg p.o. 3 times daily  as needed pain and reassess in 2 weeks.  Recommended the  patient gradually up titrate the medication to 3 times daily to avoid side effects of the medication such as dizziness and sedation - BASIC METABOLIC PANEL WITH GFR - Vitamin B12 - TSH

## 2019-03-19 LAB — BASIC METABOLIC PANEL WITH GFR
BUN: 17 mg/dL (ref 7–25)
CO2: 28 mmol/L (ref 20–32)
Calcium: 9.4 mg/dL (ref 8.6–10.3)
Chloride: 103 mmol/L (ref 98–110)
Creat: 1.05 mg/dL (ref 0.70–1.18)
GFR, Est African American: 82 mL/min/{1.73_m2} (ref >=60)
GFR, Est Non African American: 71 mL/min/{1.73_m2} (ref >=60)
Glucose, Bld: 132 mg/dL — ABNORMAL HIGH (ref 65–99)
Potassium: 3.7 mmol/L (ref 3.5–5.3)
Sodium: 138 mmol/L (ref 135–146)

## 2019-03-19 LAB — TSH: TSH: 0.68 mIU/L (ref 0.40–4.50)

## 2019-03-19 LAB — TEST AUTHORIZATION

## 2019-03-19 LAB — VITAMIN B12: Vitamin B-12: 328 pg/mL (ref 200–1100)

## 2019-03-19 LAB — HEMOGLOBIN A1C W/OUT EAG

## 2019-03-20 ENCOUNTER — Other Ambulatory Visit: Payer: Self-pay | Admitting: Family Medicine

## 2019-03-25 DIAGNOSIS — H04123 Dry eye syndrome of bilateral lacrimal glands: Secondary | ICD-10-CM | POA: Diagnosis not present

## 2019-03-25 DIAGNOSIS — H52203 Unspecified astigmatism, bilateral: Secondary | ICD-10-CM | POA: Diagnosis not present

## 2019-03-25 DIAGNOSIS — H2513 Age-related nuclear cataract, bilateral: Secondary | ICD-10-CM | POA: Diagnosis not present

## 2019-03-28 ENCOUNTER — Telehealth: Payer: Self-pay | Admitting: Podiatry

## 2019-03-28 NOTE — Telephone Encounter (Signed)
Called pt to see if he wanted to pick up his requested medical records or did he want them faxed. Pt stated either was okay, and I told him I could fax them through our EMR system. Pt asked they be faxed today.

## 2019-04-02 DIAGNOSIS — M79672 Pain in left foot: Secondary | ICD-10-CM | POA: Diagnosis not present

## 2019-04-02 DIAGNOSIS — M79671 Pain in right foot: Secondary | ICD-10-CM | POA: Diagnosis not present

## 2019-04-02 DIAGNOSIS — G609 Hereditary and idiopathic neuropathy, unspecified: Secondary | ICD-10-CM | POA: Diagnosis not present

## 2019-04-02 DIAGNOSIS — M722 Plantar fascial fibromatosis: Secondary | ICD-10-CM | POA: Diagnosis not present

## 2019-04-03 ENCOUNTER — Other Ambulatory Visit: Payer: Self-pay

## 2019-04-11 ENCOUNTER — Ambulatory Visit (INDEPENDENT_AMBULATORY_CARE_PROVIDER_SITE_OTHER): Payer: Medicare Other | Admitting: Family Medicine

## 2019-04-11 ENCOUNTER — Other Ambulatory Visit: Payer: Self-pay

## 2019-04-11 ENCOUNTER — Encounter: Payer: Self-pay | Admitting: Family Medicine

## 2019-04-11 VITALS — BP 130/78 | HR 60 | Temp 97.8°F | Resp 16 | Ht 69.5 in | Wt 216.0 lb

## 2019-04-11 DIAGNOSIS — S60222A Contusion of left hand, initial encounter: Secondary | ICD-10-CM

## 2019-04-11 MED ORDER — CEPHALEXIN 500 MG PO CAPS: 500.0000 mg | ORAL_CAPSULE | Freq: Three times a day (TID) | ORAL | 0 refills | Status: DC

## 2019-04-11 NOTE — Progress Notes (Signed)
Subjective:    Patient ID: Robert Page, male    DOB: June 20, 1946, 73 y.o.   MRN: [id number]  HPI Patient suffered a contusion to the dorsum of his left hand earlier this week.  There is a swollen indurated lump on the dorsum of the hand roughly the diameter of a quarter where the contusion occurred.  There is some bruising around this.  The patient is concerned that the bruising may represent an infection.  There is no erythema.  There is no warmth.  He has full range of motion in his hand.  He has normal sensation in his hand.  He has normal grip strength Past Medical History:  Diagnosis Date  . Adenomatous polyp   . Allergy   . Atrial tachycardia ([place])   . Diverticulosis   . Dupuytren's disease    palms and soles  . GERD (gastroesophageal reflux disease)   . Hypertension    Past Surgical History:  Procedure Laterality Date  . BACK SURGERY     12/2013  . CARDIAC CATHETERIZATION  1990's  . COLECTOMY  2010   for diverticulitis  . COLONOSCOPY  04/03/2013  . LASIK  2002   [place]  . POLYPECTOMY     Current Outpatient Medications on File Prior to Visit  Medication Sig Dispense Refill  . amLODipine (NORVASC) 5 MG tablet TAKE 1 TABLET BY MOUTH EVERY DAY 90 tablet 1  . aspirin EC 81 MG tablet Take 81 mg by mouth daily.    . famotidine (PEPCID) 20 MG tablet Take 20 mg by mouth daily as needed for heartburn.     . fexofenadine (ALLEGRA) 180 MG tablet Take 180 mg by mouth daily.     Marland Kitchen gabapentin (NEURONTIN) 300 MG capsule Take 1 capsule (300 mg total) by mouth 3 (three) times daily. 90 capsule 3  . hydrochlorothiazide (HYDRODIURIL) 25 MG tablet TAKE 1 TABLET BY MOUTH EVERY DAY 90 tablet 3  . losartan (COZAAR) 100 MG tablet TAKE 1 TABLET BY MOUTH EVERY DAY 30 tablet 30  . omeprazole (PRILOSEC) 20 MG capsule Take 20 mg by mouth daily.     No current facility-administered medications on file prior to visit.    Allergies  Allergen Reactions  . Sulfamethoxazole Other (See Comments)   other  . Sulfonamide Derivatives     REACTION: itch  . Ibuprofen Other (See Comments)    REACTION: stomach cramps other   Social History   Socioeconomic History  . Marital status: Married    Spouse name: Murray Durrell  . Number of children: Not on file  . Years of education: Not on file  . Highest education level: Not on file  Occupational History  . Occupation: retired  Scientific laboratory technician  . Financial resource strain: Not on file  . Food insecurity    Worry: Not on file    Inability: Not on file  . Transportation needs    Medical: Not on file    Non-medical: Not on file  Tobacco Use  . Smoking status: Former Smoker    Types: Cigars    Quit date: 2015    Years since quitting: 5.6  . Smokeless tobacco: Never Used  . Tobacco comment: Pt states smokes occasional cigar.  This is typically < 1 time per month  Substance and Sexual Activity  . Alcohol use: No    Alcohol/week: 0.0 standard drinks  . Drug use: No  . Sexual activity: Not on file  Lifestyle  . Physical activity  Days per week: Not on file    Minutes per session: Not on file  . Stress: Not on file  Relationships  . Social Herbalist on phone: Not on file    Gets together: Not on file    Attends religious service: Not on file    Active member of club or organization: Not on file    Attends meetings of clubs or organizations: Not on file    Relationship status: Not on file  . Intimate partner violence    Fear of current or ex partner: Not on file    Emotionally abused: Not on file    Physically abused: Not on file    Forced sexual activity: Not on file  Other Topics Concern  . Not on file  Social History Narrative   Pt lives in [place].  Retired from [employer] and [employer].  Goes on mission trips to [place], gets regular exercise.     Review of Systems  All other systems reviewed and are negative.      Objective:   Physical Exam Vitals signs reviewed.  Constitutional:      General: He  is not in acute distress.    Appearance: Normal appearance. He is normal weight. He is not ill-appearing, toxic-appearing or diaphoretic.  HENT:     Head: Normocephalic and atraumatic.     Mouth/Throat:     Pharynx: No oropharyngeal exudate or posterior oropharyngeal erythema.  Eyes:     Conjunctiva/sclera: Conjunctivae normal.  Cardiovascular:     Rate and Rhythm: Normal rate and regular rhythm.     Heart sounds: Normal heart sounds. No murmur.  Pulmonary:     Effort: Pulmonary effort is normal.     Breath sounds: Normal breath sounds. No wheezing, rhonchi or rales.  Musculoskeletal:     Left hand: He exhibits tenderness, bony tenderness and swelling. He exhibits normal range of motion. Normal sensation noted. Normal strength noted.       Hands:  Lymphadenopathy:     Cervical: No cervical adenopathy.           Assessment & Plan:  The encounter diagnosis was Contusion of left hand, initial encounter. Recommended ice and tincture of time.  Contusion should gradually resolve and reabsorb over the next 1 to 2 weeks.  If he develops erythema warmth and pain he is to let me know immediately as that could be a sign of cellulitis however I see no evidence of cellulitis today

## 2019-05-05 ENCOUNTER — Other Ambulatory Visit: Payer: Self-pay

## 2019-05-05 DIAGNOSIS — R6889 Other general symptoms and signs: Secondary | ICD-10-CM | POA: Diagnosis not present

## 2019-05-05 DIAGNOSIS — Z20822 Contact with and (suspected) exposure to covid-19: Secondary | ICD-10-CM

## 2019-05-06 ENCOUNTER — Ambulatory Visit (INDEPENDENT_AMBULATORY_CARE_PROVIDER_SITE_OTHER): Payer: Medicare Other | Admitting: Family Medicine

## 2019-05-06 ENCOUNTER — Encounter: Payer: Self-pay | Admitting: Family Medicine

## 2019-05-06 VITALS — BP 110/70 | HR 68 | Temp 97.9°F | Resp 16 | Ht 69.5 in | Wt 218.0 lb

## 2019-05-06 DIAGNOSIS — G609 Hereditary and idiopathic neuropathy, unspecified: Secondary | ICD-10-CM | POA: Diagnosis not present

## 2019-05-06 DIAGNOSIS — M79672 Pain in left foot: Secondary | ICD-10-CM

## 2019-05-06 MED ORDER — DICLOFENAC SODIUM 75 MG PO TBEC: 75.0000 mg | DELAYED_RELEASE_TABLET | Freq: Two times a day (BID) | ORAL | 0 refills | Status: DC

## 2019-05-06 NOTE — Progress Notes (Signed)
Subjective:    Patient ID: Robert Page, male    DOB: 12-13-1945, 73 y.o.   MRN: [id number]  HPI  03/17/19 Patient reports pain in both feet.  He describes the pain as a burning sensation.  It encompasses the midfoot and toes on both feet.  They always feel hot.  They ache and throb for no reason.  He also reports numbness and decreased sensation.  He has normal sensation to 10 g monofilament although he is more sensitive in his left foot than right foot.  He has normal sensation to cold.  He has normal sensation to vibration.  Reflexes are 2/4 equal and symmetric at the knee as well as at the ankle.  Muscle strength is 5/5 equal and symmetric in both legs.  He does have a history of surgery to remove soft tissue polyps from the side of his left foot.  He also has a hard bony process developing on the lateral aspect of his fifth metatarsal on both feet that is tender to palpation.  He has an appointment to see a podiatrist later this month to discuss this.  At that time, my plan was: 1. Idiopathic peripheral neuropathy I suspect a large component of the patient's pain is due to peripheral neuropathy.  Check a TSH, vitamin B12 level, and a BMP.  If lab work is normal, try the patient on gabapentin 300 mg p.o. 3 times daily as needed pain and reassess in 2 weeks.  Recommended the patient gradually up titrate the medication to 3 times daily to avoid side effects of the medication such as dizziness and sedation - BASIC METABOLIC PANEL WITH GFR - Vitamin B12 - TSH  Office Visit on 03/17/2019  Component Date Value Ref Range Status  . Glucose, Bld 03/17/2019 132* 65 - 99 mg/dL Final   Comment: .            Fasting reference interval . For someone without known diabetes, a glucose value >125 mg/dL indicates that they may have diabetes and this should be confirmed with a follow-up test. .   . BUN 03/17/2019 17  7 - 25 mg/dL Final  . Creat 03/17/2019 1.05  0.70 - 1.18 mg/dL Final   Comment: For  patients >83 years of age, the reference limit for Creatinine is approximately 13% higher for people identified as African-American. .   . GFR, Est Non African American 03/17/2019 71  > OR = 60 mL/min/1.26m2 Final  . GFR, Est African American 03/17/2019 82  > OR = 60 mL/min/1.81m2 Final  . BUN/Creatinine Ratio [id number] NOT APPLICABLE  6 - 22 (calc) Final  . Sodium 03/17/2019 138  135 - 146 mmol/L Final  . Potassium 03/17/2019 3.7  3.5 - 5.3 mmol/L Final  . Chloride 03/17/2019 103  98 - 110 mmol/L Final  . CO2 03/17/2019 28  20 - 32 mmol/L Final  . Calcium 03/17/2019 9.4  8.6 - 10.3 mg/dL Final  . Vitamin B-12 03/17/2019 328  200 - 1,100 pg/mL Final   Comment: . Please Note: Although the reference range for vitamin B12 is [phone] pg/mL, it has been reported that between 5 and 10% of patients with values between 200 and 400 pg/mL may experience neuropsychiatric and hematologic abnormalities due to occult B12 deficiency; less than 1% of patients with values above 400 pg/mL will have symptoms. .   . TSH 03/17/2019 0.68  0.40 - 4.50 mIU/L Final  . Hgb A1c MFr Bld 03/17/2019 CANCELED   Final  Comment: TEST NOT PERFORMED . No lavender-top tube received.  Result canceled by the ancillary.   . TEST NAME: 03/17/2019 HEMOGLOBIN A1c   Final  . TEST CODE: 03/17/2019 [id number]   Final  . CLIENT CONTACT: 03/17/2019 Learta Codding   Final  . REPORT ALWAYS MESSAGE SIGNATURE 03/17/2019    Final   Comment: . The laboratory testing on this patient was verbally requested or confirmed by the ordering physician or his or her authorized representative after contact with an employee of [employer]. Federal regulations require that we maintain on file written authorization for all laboratory testing.  Accordingly we are asking that the ordering physician or his or her authorized representative sign a copy of this report and promptly return it to the client service representative. . .  Signature:____________________________________________________ . Please fax this signed page to [phone] or return it via your [employer] courier.     05/06/19 Patient states that the gabapentin has not helped at all.  He is taking 300 mg 3 times a day with no relief.  Although he does still experience burning and stinging and pins-and-needles, he is primarily now complaining of stiffness.  He states that he has pain and stiffness in his ankle.  He reports decreased range of motion and limited dorsiflexion of his ankle left greater than right.  He also complains of pain in the midfoot and in the arch.  He also has pain at the insertion of the plantar fascia on the left calcaneus.  He also complains of pain and stiffness in the second third and fourth metatarsals on the dorsal surface of his foot.  The pain is a constant aching pain and is made worse by prolonged standing and walking.  This sounds less neuropathic and more arthritic.  He is not taking any NSAIDs.  Past Medical History:  Diagnosis Date  . Adenomatous polyp   . Allergy   . Atrial tachycardia ([employer])   . Diverticulosis   . Dupuytren's disease    palms and soles  . GERD (gastroesophageal reflux disease)   . Hypertension    Past Surgical History:  Procedure Laterality Date  . BACK SURGERY     12/2013  . CARDIAC CATHETERIZATION  1990's  . COLECTOMY  2010   for diverticulitis  . COLONOSCOPY  04/03/2013  . LASIK  2002   Bil  . POLYPECTOMY     Current Outpatient Medications on File Prior to Visit  Medication Sig Dispense Refill  . amLODipine (NORVASC) 5 MG tablet TAKE 1 TABLET BY MOUTH EVERY DAY 90 tablet 1  . aspirin EC 81 MG tablet Take 81 mg by mouth daily.    . cephALEXin (KEFLEX) 500 MG capsule Take 1 capsule (500 mg total) by mouth 3 (three) times daily. 21 capsule 0  . famotidine (PEPCID) 20 MG tablet Take 20 mg by mouth daily as needed for heartburn.     . fexofenadine (ALLEGRA) 180 MG tablet Take 180 mg  by mouth daily.     Marland Kitchen gabapentin (NEURONTIN) 300 MG capsule Take 1 capsule (300 mg total) by mouth 3 (three) times daily. 90 capsule 3  . hydrochlorothiazide (HYDRODIURIL) 25 MG tablet TAKE 1 TABLET BY MOUTH EVERY DAY 90 tablet 3  . losartan (COZAAR) 100 MG tablet TAKE 1 TABLET BY MOUTH EVERY DAY 30 tablet 30  . omeprazole (PRILOSEC) 20 MG capsule Take 20 mg by mouth daily.     No current facility-administered medications on file prior to visit.  Allergies  Allergen Reactions  . Sulfamethoxazole Other (See Comments)    other  . Sulfonamide Derivatives     REACTION: itch  . Ibuprofen Other (See Comments)    REACTION: stomach cramps other   Social History   Socioeconomic History  . Marital status: Married    Spouse name: Correy Bourcier  . Number of children: Not on file  . Years of education: Not on file  . Highest education level: Not on file  Occupational History  . Occupation: retired  Scientific laboratory technician  . Financial resource strain: Not on file  . Food insecurity    Worry: Not on file    Inability: Not on file  . Transportation needs    Medical: Not on file    Non-medical: Not on file  Tobacco Use  . Smoking status: Former Smoker    Types: Cigars    Quit date: 2015    Years since quitting: 5.6  . Smokeless tobacco: Never Used  . Tobacco comment: Pt states smokes occasional cigar.  This is typically < 1 time per month  Substance and Sexual Activity  . Alcohol use: No    Alcohol/week: 0.0 standard drinks  . Drug use: No  . Sexual activity: Not on file  Lifestyle  . Physical activity    Days per week: Not on file    Minutes per session: Not on file  . Stress: Not on file  Relationships  . Social Herbalist on phone: Not on file    Gets together: Not on file    Attends religious service: Not on file    Active member of club or organization: Not on file    Attends meetings of clubs or organizations: Not on file    Relationship status: Not on file  .  Intimate partner violence    Fear of current or ex partner: Not on file    Emotionally abused: Not on file    Physically abused: Not on file    Forced sexual activity: Not on file  Other Topics Concern  . Not on file  Social History Narrative   Pt lives in [place].  Retired from [employer] and [employer].  Goes on mission trips to [place], gets regular exercise.     Review of Systems  All other systems reviewed and are negative.      Objective:   Physical Exam Vitals signs reviewed.  Constitutional:      General: He is not in acute distress.    Appearance: Normal appearance. He is normal weight. He is not ill-appearing, toxic-appearing or diaphoretic.  HENT:     Head: Normocephalic and atraumatic.     Mouth/Throat:     Pharynx: No oropharyngeal exudate or posterior oropharyngeal erythema.  Eyes:     Conjunctiva/sclera: Conjunctivae normal.  Cardiovascular:     Rate and Rhythm: Normal rate and regular rhythm.     Heart sounds: Normal heart sounds. No murmur.  Pulmonary:     Effort: Pulmonary effort is normal.     Breath sounds: Normal breath sounds. No wheezing, rhonchi or rales.  Abdominal:     General: Bowel sounds are normal.     Palpations: Abdomen is soft.     Tenderness: There is no abdominal tenderness.  Musculoskeletal:     Left foot: Normal range of motion. Tenderness, bony tenderness and deformity present.       Feet:  Lymphadenopathy:     Cervical: No cervical adenopathy.  Neurological:  Mental Status: He is alert.     Sensory: Sensation is intact.     Motor: Motor function is intact.     Coordination: Coordination is intact.     Gait: Gait is intact.     Deep Tendon Reflexes: Reflexes are normal and symmetric.           Assessment & Plan:  Idiopathic peripheral neuropathy  Left foot pain - Plan: DG Foot Complete Left, diclofenac (VOLTAREN) 75 MG EC tablet  I still believe the patient has peripheral neuropathy but a lot of his pain today  sounds more arthritic.  I recommended an x-ray of the left foot given the fact that this is the worst regarding pain.  The pain is located in the calcaneus near the insertion of the plantar fascia, and the left ankle, on the dorsum of the left midfoot, and over the arch.  I believe the pain is too diffuse to be 1 specific source however I suspect osteoarthritis.  I recommended trying diclofenac 75 mg twice daily and then reassess via telephone in 2 weeks

## 2019-05-07 LAB — NOVEL CORONAVIRUS, NAA: SARS-CoV-2, NAA: NOT DETECTED

## 2019-05-17 ENCOUNTER — Other Ambulatory Visit: Payer: Self-pay | Admitting: Family Medicine

## 2019-05-17 DIAGNOSIS — M79672 Pain in left foot: Secondary | ICD-10-CM

## 2019-05-30 ENCOUNTER — Other Ambulatory Visit: Payer: Self-pay | Admitting: Family Medicine

## 2019-06-30 DIAGNOSIS — M79672 Pain in left foot: Secondary | ICD-10-CM | POA: Diagnosis not present

## 2019-06-30 DIAGNOSIS — G7249 Other inflammatory and immune myopathies, not elsewhere classified: Secondary | ICD-10-CM | POA: Diagnosis not present

## 2019-06-30 DIAGNOSIS — M79671 Pain in right foot: Secondary | ICD-10-CM | POA: Diagnosis not present

## 2019-07-07 ENCOUNTER — Other Ambulatory Visit: Payer: Self-pay

## 2019-07-07 ENCOUNTER — Ambulatory Visit (INDEPENDENT_AMBULATORY_CARE_PROVIDER_SITE_OTHER): Payer: Medicare Other | Admitting: Family Medicine

## 2019-07-07 ENCOUNTER — Telehealth: Payer: Self-pay | Admitting: Interventional Cardiology

## 2019-07-07 DIAGNOSIS — Z20828 Contact with and (suspected) exposure to other viral communicable diseases: Secondary | ICD-10-CM | POA: Diagnosis not present

## 2019-07-07 DIAGNOSIS — J069 Acute upper respiratory infection, unspecified: Secondary | ICD-10-CM

## 2019-07-07 DIAGNOSIS — Z20822 Contact with and (suspected) exposure to covid-19: Secondary | ICD-10-CM

## 2019-07-07 NOTE — Telephone Encounter (Signed)
Patient c/o Palpitations:  High priority if patient c/o lightheadedness, shortness of breath, or chest pain  1) How long have you had palpitations/irregular HR/ Afib? Are you having the symptoms now? Flutter for about 3 weeks to a month  2) Are you currently experiencing lightheadedness, SOB or CP? no  3) Do you have a history of afib (atrial fibrillation) or irregular heart rhythm? yes  4) Have you checked your BP or HR? (document readings if available): no. He does not have means to check it   5) Are you experiencing any other symptoms? Fatigue, nausea   Patient is having some fluttering in his throat. He has had this feeling before, but he does not know what is causing this feeling. This has been going on for the past 3 weeks or so. He thinks he may need to wear a heart monitor short term again. He does not want to do a virtual visit. He was due to see Dr. Irish Lack in October.  Patient is on his way to get tested for COVID now

## 2019-07-07 NOTE — Telephone Encounter (Signed)
First thing is lets see what COVID test result is.  If positive, that may explain some symptoms.

## 2019-07-07 NOTE — Telephone Encounter (Signed)
I spoke with patient who was about to be [place] tested.  He will call us when he returns home.

## 2019-07-07 NOTE — Telephone Encounter (Signed)
I spoke to the patient who just returned home from being [place] tested.    He saw Dr Irish Lack 1 year ago and was due in October for a 1 year f/u.  He recently developed a "fluttering" in his throat and lacks strength/stamina.  He can walk flat surfaces, but struggles with inclines.  He denies CP or SOB, but has concerns with Afib rhythm and was seeking an appointment.  Please advise, thank you.

## 2019-07-07 NOTE — Progress Notes (Signed)
Subjective:    Patient ID: Robert Page, male    DOB: August 22, 1946, 73 y.o.   MRN: [id number]  HPI  Patient is being seen today as a telephone visit.  Phone call began at 840.  Phone call concluded at 850.  Patient consents to be seen by telephone.  He went on a fishing trip last week down to [place].  However he was only around his family.  None of them are sick.  The water was rough and he did get some seasickness.  However over the last 2 to 3 days he has been nauseated.  He denies any vomiting.  He denies any fever.  However he does have a body aches.  He does have cough.  However he states the cough is not severe.  It is not productive.  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  However he also has some shortness of breath.  This is new.  He is also having congestion.  He denies fever, chest pain, pleurisy, hemoptysis. Past Medical History:  Diagnosis Date  . Adenomatous polyp   . Allergy   . Atrial tachycardia ([place])   . Diverticulosis   . Dupuytren's disease    palms and soles  . GERD (gastroesophageal reflux disease)   . Hypertension    Past Surgical History:  Procedure Laterality Date  . BACK SURGERY     12/2013  . CARDIAC CATHETERIZATION  1990's  . COLECTOMY  2010   for diverticulitis  . COLONOSCOPY  04/03/2013  . LASIK  2002   Bil  . POLYPECTOMY     Current Outpatient Medications on File Prior to Visit  Medication Sig Dispense Refill  . amLODipine (NORVASC) 5 MG tablet TAKE 1 TABLET BY MOUTH EVERY DAY 90 tablet 3  . aspirin EC 81 MG tablet Take 81 mg by mouth daily.    . diclofenac (VOLTAREN) 75 MG EC tablet TAKE 1 TABLET BY MOUTH TWICE A DAY 30 tablet 3  . famotidine (PEPCID) 20 MG tablet Take 20 mg by mouth daily as needed for heartburn.     . fexofenadine (ALLEGRA) 180 MG tablet Take 180 mg by mouth daily.     Marland Kitchen gabapentin (NEURONTIN) 300  MG capsule Take 1 capsule (300 mg total) by mouth 3 (three) times daily. 90 capsule 3  . hydrochlorothiazide (HYDRODIURIL) 25 MG tablet TAKE 1 TABLET BY MOUTH EVERY DAY 90 tablet 3  . losartan (COZAAR) 100 MG tablet TAKE 1 TABLET BY MOUTH EVERY DAY 30 tablet 30  . omeprazole (PRILOSEC) 20 MG capsule Take 20 mg by mouth daily.     No current facility-administered medications on file prior to visit.    Allergies  Allergen Reactions  . Sulfamethoxazole Other (See Comments)    other  . Sulfonamide Derivatives     REACTION: itch  . Ibuprofen Other (See Comments)    REACTION: stomach cramps other   Social History   Socioeconomic History  . Marital status: Married    Spouse name: Davie Scarpulla  . Number of children: Not on file  . Years of education: Not on file  . Highest education level: Not on file  Occupational History  . Occupation: retired  Scientific laboratory technician  . Financial resource strain: Not on file  . Food insecurity    Worry: Not on file    Inability:  Not on file  . Transportation needs    Medical: Not on file    Non-medical: Not on file  Tobacco Use  . Smoking status: Former Smoker    Types: Cigars    Quit date: 2015    Years since quitting: 5.8  . Smokeless tobacco: Never Used  . Tobacco comment: Pt states smokes occasional cigar.  This is typically < 1 time per month  Substance and Sexual Activity  . Alcohol use: No    Alcohol/week: 0.0 standard drinks  . Drug use: No  . Sexual activity: Not on file  Lifestyle  . Physical activity    Days per week: Not on file    Minutes per session: Not on file  . Stress: Not on file  Relationships  . Social Herbalist on phone: Not on file    Gets together: Not on file    Attends religious service: Not on file    Active member of club or organization: Not on file    Attends meetings of clubs or organizations: Not on file    Relationship status: Not on file  . Intimate partner violence    Fear of current or ex  partner: Not on file    Emotionally abused: Not on file    Physically abused: Not on file    Forced sexual activity: Not on file  Other Topics Concern  . Not on file  Social History Narrative   Pt lives in [place].  Retired from [employer] and [employer].  Goes on mission trips to [place], gets regular exercise.     Review of Systems  All other systems reviewed and are negative.      Objective:   Physical Exam Physical exam could not be performed today as patient was seen as a telephone visit however he is speaking full and complete sentences without respiratory distress.       Assessment & Plan:  URI, acute - Plan: Novel Coronavirus, NAA ([facility])  I believe symptoms likely represent allergies related to his environmental work exposure.  Less likely would also be hypersensitivity pneumonitis.  Patient may also have a viral upper respiratory infection.  However given the current COVID-19 pandemic I have recommended that he quarantine and get tested to avoid spread to other individuals.  Patient agrees.  His symptoms are mild and so no treatment is necessary at this time however he should be rechecked immediately if symptoms worsen.

## 2019-07-07 NOTE — Telephone Encounter (Signed)
I spoke to the patient with Dr Hassell Done recommendation.  He verbalized understanding and will call us once resulted.

## 2019-07-09 LAB — NOVEL CORONAVIRUS, NAA: SARS-CoV-2, NAA: NOT DETECTED

## 2019-07-15 DIAGNOSIS — G7249 Other inflammatory and immune myopathies, not elsewhere classified: Secondary | ICD-10-CM | POA: Diagnosis not present

## 2019-07-24 ENCOUNTER — Other Ambulatory Visit: Payer: Self-pay

## 2019-07-25 ENCOUNTER — Ambulatory Visit (INDEPENDENT_AMBULATORY_CARE_PROVIDER_SITE_OTHER): Payer: Medicare Other | Admitting: Family Medicine

## 2019-07-25 ENCOUNTER — Encounter: Payer: Self-pay | Admitting: Family Medicine

## 2019-07-25 VITALS — BP 130/72 | HR 68 | Temp 97.8°F | Resp 16 | Ht 69.5 in | Wt 215.0 lb

## 2019-07-25 DIAGNOSIS — G609 Hereditary and idiopathic neuropathy, unspecified: Secondary | ICD-10-CM | POA: Diagnosis not present

## 2019-07-25 MED ORDER — FLUTICASONE PROPIONATE 50 MCG/ACT NA SUSP: 2.0000 | Freq: Every day | NASAL | 6 refills | Status: DC

## 2019-07-25 MED ORDER — PREGABALIN 75 MG PO CAPS: 75.0000 mg | ORAL_CAPSULE | Freq: Two times a day (BID) | ORAL | 1 refills | Status: DC

## 2019-07-25 NOTE — Progress Notes (Signed)
Subjective:    Patient ID: Robert Page, male    DOB: [birth year], 73 y.o.   MRN: [id number]  HPI  03/17/19 Patient reports pain in both feet.  He describes the pain as a burning sensation.  It encompasses the midfoot and toes on both feet.  They always feel hot.  They ache and throb for no reason.  He also reports numbness and decreased sensation.  He has normal sensation to 10 g monofilament although he is more sensitive in his left foot than right foot.  He has normal sensation to cold.  He has normal sensation to vibration.  Reflexes are 2/4 equal and symmetric at the knee as well as at the ankle.  Muscle strength is 5/5 equal and symmetric in both legs.  He does have a history of surgery to remove soft tissue polyps from the side of his left foot.  He also has a hard bony process developing on the lateral aspect of his fifth metatarsal on both feet that is tender to palpation.  He has an appointment to see a podiatrist later this month to discuss this.  At that time, my plan was: 1. Idiopathic peripheral neuropathy I suspect a large component of the patient's pain is due to peripheral neuropathy.  Check a TSH, vitamin B12 level, and a BMP.  If lab work is normal, try the patient on gabapentin 300 mg p.o. 3 times daily as needed pain and reassess in 2 weeks.  Recommended the patient gradually up titrate the medication to 3 times daily to avoid side effects of the medication such as dizziness and sedation - BASIC METABOLIC PANEL WITH GFR - Vitamin B12 - TSH At that time, my plan was: I still believe the patient has peripheral neuropathy but a lot of his pain today sounds more arthritic.  I recommended an x-ray of the left foot given the fact that this is the worst regarding pain.  The pain is located in the calcaneus near the insertion of the plantar fascia, and the left ankle, on the dorsum of the left midfoot, and over the arch.  I believe the pain is too diffuse to be 1 specific source however I  suspect osteoarthritis.  I recommended trying diclofenac 75 mg twice daily and then reassess via telephone in 2 weeks  07/25/19 Since that time, the patient has tried gabapentin but saw no benefit.  He is seen a foot specialist and been diagnosed with peripheral neuropathy as well.  They have switched him to amitriptyline.  This was approximately 3 weeks ago.  Since switching to amitriptyline, the patient has seen a worsening of the pain in his feet.  He is also developed nausea every day.  It will come and go and spells.  He will not vomit.  He denies any constipation.  He denies any melena or hematochezia.  He denies any fever chills or night sweats or weight loss.  He denies any abdominal pain.  He does still have his gallbladder.  It tends to occur unprovoked in the middle of the day and last minutes to hours.  Is been going on also about 3 weeks ever since the medication change.  He also reports feeling like his nostrils are closed at night when he tries to sleep.  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  Examination of his nasal passages reveals swelling in the left nasal mucosa but no significant rhinorrhea or nasal obstruction.  He denies  any sinus pain or pressure Orders Only on 07/07/2019  Component Date Value Ref Range Status  . SARS-CoV-2, NAA 07/07/2019 Not Detected  Not Detected Final   Comment: This nucleic acid amplification test was developed and its performance characteristics determined by [employer]. Nucleic acid amplification tests include PCR and TMA. This test has not been FDA cleared or approved. This test has been authorized by FDA under an Emergency Use Authorization (EUA). This test is only authorized for the duration of time the declaration that circumstances exist justifying the authorization of the emergency use of in vitro diagnostic tests for detection of SARS-CoV-2 virus and/or diagnosis of COVID-19 infection under section 564(b)(1) of the Act, 21  U.S.C. [id number]) (1), unless the authorization is terminated or revoked sooner. When diagnostic testing is negative, the possibility of a false negative result should be considered in the context of a patient's recent exposures and the presence of clinical signs and symptoms consistent with COVID-19. An individual without symptoms of COVID-19 and who is not shedding SARS-CoV-2 virus would                           expect to have a negative (not detected) result in this assay.     05/06/19 Patient states that the gabapentin has not helped at all.  He is taking 300 mg 3 times a day with no relief.  Although he does still experience burning and stinging and pins-and-needles, he is primarily now complaining of stiffness.  He states that he has pain and stiffness in his ankle.  He reports decreased range of motion and limited dorsiflexion of his ankle left greater than right.  He also complains of pain in the midfoot and in the arch.  He also has pain at the insertion of the plantar fascia on the left calcaneus.  He also complains of pain and stiffness in the second third and fourth metatarsals on the dorsal surface of his foot.  The pain is a constant aching pain and is made worse by prolonged standing and walking.  This sounds less neuropathic and more arthritic.  He is not taking any NSAIDs.  Past Medical History:  Diagnosis Date  . Adenomatous polyp   . Allergy   . Atrial tachycardia ([place])   . Diverticulosis   . Dupuytren's disease    palms and soles  . GERD (gastroesophageal reflux disease)   . Hypertension    Past Surgical History:  Procedure Laterality Date  . BACK SURGERY     12/2013  . CARDIAC CATHETERIZATION  1990's  . COLECTOMY  2010   for diverticulitis  . COLONOSCOPY  04/03/2013  . LASIK  2002   Bil  . POLYPECTOMY     Current Outpatient Medications on File Prior to Visit  Medication Sig Dispense Refill  . amitriptyline (ELAVIL) 10 MG tablet amitriptyline 10 mg tablet   TAKE 1 TABLET BY MOUTH EVERY DAY    . amLODipine (NORVASC) 5 MG tablet TAKE 1 TABLET BY MOUTH EVERY DAY 90 tablet 3  . aspirin EC 81 MG tablet Take 81 mg by mouth daily.    . famotidine (PEPCID) 20 MG tablet Take 20 mg by mouth daily as needed for heartburn.     . fexofenadine (ALLEGRA) 180 MG tablet Take 180 mg by mouth daily.     . hydrochlorothiazide (HYDRODIURIL) 25 MG tablet TAKE 1 TABLET BY MOUTH EVERY DAY 90 tablet 3  . losartan (COZAAR) 100 MG  tablet TAKE 1 TABLET BY MOUTH EVERY DAY 30 tablet 30  . omeprazole (PRILOSEC) 20 MG capsule Take 20 mg by mouth daily.     No current facility-administered medications on file prior to visit.    Allergies  Allergen Reactions  . Sulfamethoxazole Other (See Comments)    other  . Sulfonamide Derivatives     REACTION: itch  . Ibuprofen Other (See Comments)    REACTION: stomach cramps other   Social History   Socioeconomic History  . Marital status: Married    Spouse name: Binh Mencias  . Number of children: Not on file  . Years of education: Not on file  . Highest education level: Not on file  Occupational History  . Occupation: retired  Scientific laboratory technician  . Financial resource strain: Not on file  . Food insecurity    Worry: Not on file    Inability: Not on file  . Transportation needs    Medical: Not on file    Non-medical: Not on file  Tobacco Use  . Smoking status: Former Smoker    Types: Cigars    Quit date: 2015    Years since quitting: 5.8  . Smokeless tobacco: Never Used  . Tobacco comment: Pt states smokes occasional cigar.  This is typically < 1 time per month  Substance and Sexual Activity  . Alcohol use: No    Alcohol/week: 0.0 standard drinks  . Drug use: No  . Sexual activity: Not on file  Lifestyle  . Physical activity    Days per week: Not on file    Minutes per session: Not on file  . Stress: Not on file  Relationships  . Social Herbalist on phone: Not on file    Gets together: Not on file     Attends religious service: Not on file    Active member of club or organization: Not on file    Attends meetings of clubs or organizations: Not on file    Relationship status: Not on file  . Intimate partner violence    Fear of current or ex partner: Not on file    Emotionally abused: Not on file    Physically abused: Not on file    Forced sexual activity: Not on file  Other Topics Concern  . Not on file  Social History Narrative   Pt lives in [place].  Retired from [employer] and [employer].  Goes on mission trips to [place], gets regular exercise.     Review of Systems  All other systems reviewed and are negative.      Objective:   Physical Exam Vitals signs reviewed.  Constitutional:      General: He is not in acute distress.    Appearance: Normal appearance. He is normal weight. He is not ill-appearing, toxic-appearing or diaphoretic.  HENT:     Head: Normocephalic and atraumatic.     Mouth/Throat:     Pharynx: No oropharyngeal exudate or posterior oropharyngeal erythema.  Eyes:     Conjunctiva/sclera: Conjunctivae normal.  Cardiovascular:     Rate and Rhythm: Normal rate and regular rhythm.     Heart sounds: Normal heart sounds. No murmur.  Pulmonary:     Effort: Pulmonary effort is normal.     Breath sounds: Normal breath sounds. No wheezing, rhonchi or rales.  Abdominal:     General: Bowel sounds are normal.     Palpations: Abdomen is soft.     Tenderness: There is no  abdominal tenderness.  Musculoskeletal:     Left foot: Tenderness and bony tenderness present.  Lymphadenopathy:     Cervical: No cervical adenopathy.  Neurological:     Mental Status: He is alert.     Sensory: Sensation is intact.     Motor: Motor function is intact.     Coordination: Coordination is intact.     Gait: Gait is intact.     Deep Tendon Reflexes: Reflexes are normal and symmetric.           Assessment & Plan:  Idiopathic peripheral neuropathy  I still believe that the  patient's burning pain in both feet radiating above his ankles is peripheral neuropathy.  I recommended discontinuing the amitriptyline and the Aleve as I believe some of his nausea may be a medication side effect or even possibly an early ulcer.  I would like him to try Lyrica 75 mg twice daily and then reassess to see if the pain is improving.  If not, I would recommend a neurology consultation.  Patient can have nerve conduction studies at that time.  Can uptitrate Lyrica if beneficial.  I believe the nausea is likely medication induced.  However if not, I would recommend further evaluation if the nausea does not improve after discontinuation of the Aleve and amitriptyline.  I also told the patient to try Flonase 2 sprays each nostril every night for the subjective nasal congestion.  He can also use Vicks VapoRub as well as a humidifier next to his bed

## 2019-08-06 NOTE — Progress Notes (Signed)
Cardiology Office Note   Date:  08/08/2019   ID:  Robert Page, DOB 1946/02/24, MRN [id number]  PCP:  Susy Frizzle, MD    No chief complaint on file.  Coronary calcium  Wt Readings from Last 3 Encounters:  08/08/19 219 lb 12.8 oz (99.7 kg)  07/25/19 215 lb (97.5 kg)  05/06/19 218 lb (98.9 kg)       History of Present Illness: Robert Page is a 73 y.o. male   Who has a past medical history significant forGERD, hypertension, atrial tachycardia.  The patient saw Dr.Allred in 2014 for nonsustained SVT suspected to be atrial tachycardia. As of 12/2012 his symptoms had markedly improved.He was treated briefly with diltiazem but with improvement in his symptoms he stopped that. An echocardiogram on 10/30/2012 showed normal LV systolic function with a EF 65%, bilateral atrial dilatation. He also had a low risk Myoview. He declined consideration of ablation or addition of medications. He has had no significant palpitations since then.   He quit smoking cigars.  Echo in 4/19 for DOE showed: Left ventricle: The cavity size was normal. There was mild concentric hypertrophy. Systolic function was normal. The estimated ejection fraction was in the range of 60% to 65%. Wall motion was normal; there were no regional wall motion abnormalities. Left ventricular diastolic function parameters were normal. - Aortic valve: Transvalvular velocity was within the normal range. There was no stenosis. There was no regurgitation. - Aorta: Ascending aortic diameter: 37 mm (S). - Ascending aorta: The ascending aorta was mildly dilated. - Mitral valve: Transvalvular velocity was within the normal range. There was no evidence for stenosis. There was trivial regurgitation. - Right ventricle: The cavity size was normal. Wall thickness was normal. Systolic function was normal. - Atrial septum: No defect or patent foramen ovale was identified by color flow Doppler.  - Tricuspid valve: There was mild regurgitation. - Pulmonary arteries: Systolic pressure was within the normal range. PA peak pressure: 25 mm Hg (S).  CT aorta in 4/19: 1. Normal caliber thoracic aorta without evidence of aneurysmal disease. Maximal caliber of the aorta is 3.5 cm at the level of the tubular ascending aorta. Mild atherosclerosis present at the level of the aortic arch. 2. Calcified plaque in the distribution of the LAD. 3. Stable 5 mm subpleural right lower lobe lung nodule. This nodule has been stable for nearly 3 years and is therefore benign.  In 2019, he had perhaps 2 episodes of tachycardia.  THey lasted up to 5 seconds.  He was not bothered by it at all.    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  He does a lot of lifting.  He has a nordic-trac, but has not been using it.  Foot pain is a limitation.  Since the last visit, he had some fluttering in his chest.  It is rare. It goes away after a few seconds.  Denies : Chest pain. Dizziness. Leg edema. Nitroglycerin use. Orthopnea. Palpitations. Paroxysmal nocturnal dyspnea. Shortness of breath. Syncope.   He continues to work on the farm.     Past Medical History:  Diagnosis Date  . Adenomatous polyp   . Allergy   . Atrial tachycardia ([place])   . Diverticulosis   . Dupuytren's disease    palms and soles  . GERD (gastroesophageal reflux disease)   . Hypertension     Past Surgical History:  Procedure Laterality Date  . BACK SURGERY     12/2013  .  CARDIAC CATHETERIZATION  1990's  . COLECTOMY  2010   for diverticulitis  . COLONOSCOPY  04/03/2013  . LASIK  2002   Bil  . POLYPECTOMY       Current Outpatient Medications  Medication Sig Dispense Refill  . amitriptyline (ELAVIL) 10 MG tablet amitriptyline 10 mg tablet  TAKE 1 TABLET BY MOUTH EVERY DAY    . amLODipine (NORVASC) 5 MG tablet TAKE 1 TABLET BY MOUTH EVERY DAY 90 tablet 3  . aspirin EC 81 MG tablet Take 81 mg by mouth daily.    .  fexofenadine (ALLEGRA) 180 MG tablet Take 180 mg by mouth daily.     . fluticasone (FLONASE) 50 MCG/ACT nasal spray Place 2 sprays into both nostrils daily. 16 g 6  . hydrochlorothiazide (HYDRODIURIL) 25 MG tablet TAKE 1 TABLET BY MOUTH EVERY DAY 90 tablet 3  . losartan (COZAAR) 100 MG tablet TAKE 1 TABLET BY MOUTH EVERY DAY 30 tablet 30  . omeprazole (PRILOSEC) 20 MG capsule Take 20 mg by mouth daily.    . pregabalin (LYRICA) 75 MG capsule Take 1 capsule (75 mg total) by mouth 2 (two) times daily. 60 capsule 1   No current facility-administered medications for this visit.     Allergies:   Sulfamethoxazole, Sulfonamide derivatives, and Ibuprofen    Social History:  The patient  reports that he quit smoking about 5 years ago. His smoking use included cigars. He has never used smokeless tobacco. He reports that he does not drink alcohol or use drugs.   Family History:  The patient's family history includes Arthritis in his brother; Colon cancer in his paternal grandmother, paternal uncle, and unknown relative; Dementia in his mother; Heart disease in his cousin and unknown relative; Hypertension in his brother, father, and mother; Kidney disease in his mother and unknown relative; Liver cancer in his unknown relative; Lung cancer in his unknown relative; Macular degeneration in his mother; Melanoma in his sister; Prostate cancer in his paternal uncle and unknown relative; Stomach cancer in his cousin; Stroke in his unknown relative.    ROS:  Please see the history of present illness.   Otherwise, review of systems are positive for exposed to dust; wearing a mask. Chronic foot pain.   All other systems are reviewed and negative.    PHYSICAL EXAM: VS:  BP 124/70   Pulse (!) 58   Ht 5' 9.5" (1.765 m)   Wt 219 lb 12.8 oz (99.7 kg)   SpO2 95%   BMI 31.99 kg/m  , BMI Body mass index is 31.99 kg/m. GEN: Well nourished, well developed, in no acute distress  HEENT: normal  Neck: no JVD, carotid  bruits, or masses Cardiac: RRR; no murmurs, rubs, or gallops,no edema  Respiratory:  clear to auscultation bilaterally, normal work of breathing GI: soft, nontender, nondistended, + BS MS: no deformity or atrophy  Skin: warm and dry, no rash Neuro:  Strength and sensation are intact Psych: euthymic mood, full affect   EKG:   The ekg ordered today demonstrates NSR, inferior ST changes- similar to prior ECG from 2019   Recent Labs: 03/17/2019: BUN 17; Creat 1.05; Potassium 3.7; Sodium 138; TSH 0.68   Lipid Panel    Component Value Date/Time   CHOL 167 11/19/2017 0853   TRIG 63 11/19/2017 0853   HDL 52 11/19/2017 0853   CHOLHDL 3.2 11/19/2017 0853   VLDL 15 11/13/2016 0854   LDLCALC 100 (H) 11/19/2017 0853     Other studies  Reviewed: Additional studies/ records that were reviewed today with results demonstrating: .   ASSESSMENT AND PLAN:  1. [id number] cardio type exercise.  Foot pain is a limiting factor.  2. Coronary calcification: No angina. Healthy diet and regular exercise recommended.  Needs to find some aerobic activity that does not bother his feet.  3. SVT: Sx well controlled.  4. Carotid artery disease: Minimal in 2018. No bruit on exam.  COntinue preventive therapy.     Current medicines are reviewed at length with the patient today.  The patient concerns regarding his medicines were addressed.  The following changes have been made:  No change  Labs/ tests ordered today include:  No orders of the defined types were placed in this encounter.   Recommend 150 minutes/week of aerobic exercise Low fat, low carb, high fiber diet recommended  Disposition:   FU in 1 year   Signed, Larae Grooms, MD  08/08/2019 10:27 AM    [facility] [address] [place], [place], [place]  [address] Phone: [phone]; Fax: [phone]

## 2019-08-08 ENCOUNTER — Ambulatory Visit (INDEPENDENT_AMBULATORY_CARE_PROVIDER_SITE_OTHER): Payer: Medicare Other | Admitting: Interventional Cardiology

## 2019-08-08 ENCOUNTER — Other Ambulatory Visit: Payer: Self-pay

## 2019-08-08 ENCOUNTER — Encounter: Payer: Self-pay | Admitting: Interventional Cardiology

## 2019-08-08 VITALS — BP 124/70 | HR 58 | Ht 69.5 in | Wt 219.8 lb

## 2019-08-08 DIAGNOSIS — I471 Supraventricular tachycardia: Secondary | ICD-10-CM | POA: Diagnosis not present

## 2019-08-08 DIAGNOSIS — I6523 Occlusion and stenosis of bilateral carotid arteries: Secondary | ICD-10-CM

## 2019-08-08 DIAGNOSIS — I2510 Atherosclerotic heart disease of native coronary artery without angina pectoris: Secondary | ICD-10-CM

## 2019-08-08 DIAGNOSIS — R0609 Other forms of dyspnea: Secondary | ICD-10-CM

## 2019-08-08 DIAGNOSIS — R0600 Dyspnea, unspecified: Secondary | ICD-10-CM

## 2019-08-08 NOTE — Patient Instructions (Signed)

## 2019-08-11 DIAGNOSIS — M25572 Pain in left ankle and joints of left foot: Secondary | ICD-10-CM | POA: Diagnosis not present

## 2019-08-11 DIAGNOSIS — M79672 Pain in left foot: Secondary | ICD-10-CM | POA: Diagnosis not present

## 2019-08-18 DIAGNOSIS — M25572 Pain in left ankle and joints of left foot: Secondary | ICD-10-CM | POA: Diagnosis not present

## 2019-09-04 ENCOUNTER — Encounter

## 2019-09-09 DIAGNOSIS — R202 Paresthesia of skin: Secondary | ICD-10-CM | POA: Diagnosis not present

## 2019-09-12 DIAGNOSIS — D294 Benign neoplasm of scrotum: Secondary | ICD-10-CM | POA: Diagnosis not present

## 2019-09-24 ENCOUNTER — Other Ambulatory Visit: Payer: Self-pay

## 2019-09-24 ENCOUNTER — Ambulatory Visit (INDEPENDENT_AMBULATORY_CARE_PROVIDER_SITE_OTHER): Payer: Medicare Other | Admitting: Neurology

## 2019-09-24 ENCOUNTER — Encounter: Payer: Self-pay | Admitting: Neurology

## 2019-09-24 VITALS — BP 122/72 | HR 62 | Temp 97.0°F | Ht 69.5 in | Wt 219.0 lb

## 2019-09-24 DIAGNOSIS — M79672 Pain in left foot: Secondary | ICD-10-CM | POA: Diagnosis not present

## 2019-09-24 DIAGNOSIS — R208 Other disturbances of skin sensation: Secondary | ICD-10-CM | POA: Diagnosis not present

## 2019-09-24 DIAGNOSIS — G629 Polyneuropathy, unspecified: Secondary | ICD-10-CM | POA: Insufficient documentation

## 2019-09-24 DIAGNOSIS — M79605 Pain in left leg: Secondary | ICD-10-CM | POA: Insufficient documentation

## 2019-09-24 DIAGNOSIS — M79604 Pain in right leg: Secondary | ICD-10-CM | POA: Insufficient documentation

## 2019-09-24 MED ORDER — OXCARBAZEPINE 300 MG PO TABS: ORAL_TABLET | ORAL | 5 refills | Status: DC

## 2019-09-24 NOTE — Progress Notes (Signed)
[facility]  PATIENT: Robert Page DOB: 1946/04/04  REFERRING DOCTOR OR PCP: Leafy Kindle, PA C (rheumatology) Jenna Luo, MD (PCP SOURCE: Patient, notes from primary care, laboratory and imaging reports,  _________________________________   HISTORICAL  CHIEF COMPLAINT:  Chief Complaint  Patient presents with  . New Patient (Initial Visit)    RM 12, alone. Paper referral from Leafy Kindle, [place] for bilateral foot/ankle numbness/pain. Thought he had neuropathy and placed him on gabapentin but ineffective. Switched to Lyrica and this was ineffective. He stopped this.     HISTORY OF PRESENT ILLNESS:  I had the pleasure of seeing your patient, Robert Page, at [facility] for neurologic consultation regarding his foot numbness and pain.  He is a 74 year old man who has had progressive dysesthesias in his feet.   He has a tight sensation and mild pain that seems to go up the leg some at times.   Symptoms started in his left foot.   Currently symptoms are bilateral though left is worse.    He feels fatigued in his legs but not weak.  He sometimes gets leg cramps.    He notes no change in gait for the most part but occasionally feels off balanced.   He is needing to hold the wall when he closes his eyes in the shower.    Pain increases when he stands up a while.   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       He tried gabapentin and then was switched to Lyrica.   He does not feel he had any benefit.  Prednisone did not help.  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 Ellene Route around 2015.   He had left Morton neuroma surgery about 3 years ago.   He has scar tissue in his left sole..     he has had stomach/small intestine surgery.     He has urinary hesitancy but has a known large prostate.   Flomax and Cialis had not helped.    Studies reviewed: MRI ankle 09/19/2017: IMPRESSION: 1. Normal peroneal  tendons. 2. No acute osseous abnormality. 3. Small posterior subtalar joint effusion.  However another review of the MRI found Achilles and peroneal brevis tendinosis and plantar fasciitis  MRI cervical spine 12/11/2017.  I personally reviewed these images and concur with the official impression below.  There is some foraminal narrowing to the right at the involved levels but no definite nerve root compression.  There is 1 small T2 hyperintense focus within the pons. IMPRESSION: No abnormality seen to explain the presenting symptoms. Minimal, non-compressive disc bulges at C3-4, C4-5 and C5-6. No evidence of facet arthropathy.  He reports he  had Lumbar MRi at [facility] but not in PACS.  But he states being told nothing that would explain his symptoms.    Rheum consult 09/09/2019 reported MRI's showed chronic changes but no explanation for pain  (not sure which MRI).    ACE, RF, CCP, ANA, Lyme, CRP, ESR, HLA, CK, uric acid and ANCA were normal or negative  REVIEW OF SYSTEMS: Constitutional: No fevers, chills, sweats, or change in appetite.  Sleep is sometimes poor. Eyes: No visual changes, double vision, eye pain Ear, nose and throat: No hearing loss, ear pain, nasal congestion, sore throat Cardiovascular: No chest pain, palpitations Respiratory: No shortness of breath at rest or with exertion.   No wheezes GastrointestinaI: No nausea, vomiting, diarrhea,  abdominal pain, fecal incontinence Genitourinary: He has urinary hesitancy.   He sees urology.  No benefit from medications. . Musculoskeletal: as above Integumentary: No rash, pruritus, skin lesions Neurological: as above Psychiatric: No depression at this time.  No anxiety Endocrine: No palpitations, diaphoresis, change in appetite, change in weigh or increased thirst Hematologic/Lymphatic: No anemia, purpura, petechiae. Allergic/Immunologic: No itchy/runny eyes, nasal congestion, recent allergic reactions,  rashes  ALLERGIES: Allergies  Allergen Reactions  . Sulfamethoxazole Other (See Comments)    other  . Sulfonamide Derivatives     REACTION: itch  . Ibuprofen Other (See Comments)    REACTION: stomach cramps other    HOME MEDICATIONS:  Current Outpatient Medications:  .  amLODipine (NORVASC) 5 MG tablet, TAKE 1 TABLET BY MOUTH EVERY DAY, Disp: 90 tablet, Rfl: 3 .  aspirin EC 81 MG tablet, Take 81 mg by mouth daily., Disp: , Rfl:  .  fexofenadine (ALLEGRA) 180 MG tablet, Take 180 mg by mouth daily. , Disp: , Rfl:  .  fluticasone (FLONASE) 50 MCG/ACT nasal spray, Place 2 sprays into both nostrils daily. (Patient taking differently: Place 2 sprays into both nostrils as needed. ), Disp: 16 g, Rfl: 6 .  hydrochlorothiazide (HYDRODIURIL) 25 MG tablet, TAKE 1 TABLET BY MOUTH EVERY DAY, Disp: 90 tablet, Rfl: 3 .  losartan (COZAAR) 100 MG tablet, TAKE 1 TABLET BY MOUTH EVERY DAY, Disp: 30 tablet, Rfl: 30 .  omeprazole (PRILOSEC) 20 MG capsule, Take 20 mg by mouth daily., Disp: , Rfl:   PAST MEDICAL HISTORY: Past Medical History:  Diagnosis Date  . Adenomatous polyp   . Allergy   . Atrial tachycardia ([place])   . Diverticulosis   . Dupuytren's disease    palms and soles  . GERD (gastroesophageal reflux disease)   . Hypertension     PAST SURGICAL HISTORY: Past Surgical History:  Procedure Laterality Date  . BACK SURGERY     12/2013  . CARDIAC CATHETERIZATION  1990's  . COLECTOMY  2010   for diverticulitis  . COLONOSCOPY  04/03/2013  . LASIK  2002   [place]  . POLYPECTOMY      FAMILY HISTORY: Family History  Problem Relation Age of Onset  . Hypertension Mother   . Macular degeneration Mother   . Kidney disease Mother   . Dementia Mother   . Hypertension Father   . Melanoma Sister   . Stroke Other        uncle  . Prostate cancer Other        uncle  . Colon cancer Other        grandmother/uncle  . Kidney disease Other        grandmother  . Liver cancer Other         uncle (mets)  . Heart disease Other        aunt/uncle  . Lung cancer Other        uncle  . Stomach cancer Cousin   . Heart disease Cousin   . Colon cancer Paternal Grandmother   . Colon cancer Paternal Uncle   . Prostate cancer Paternal Uncle   . Arthritis Brother   . Hypertension Brother   . Esophageal cancer Neg Hx   . Rectal cancer Neg Hx     SOCIAL HISTORY:  Social History   Socioeconomic History  . Marital status: Married    Spouse name: Kamdon Reisig  . Number of children: 3  . Years of education: Not on file  . Highest  education level: Not on file  Occupational History  . Occupation: Retired  Tobacco Use  . Smoking status: Former Smoker    Types: Cigars    Quit date: 2015    Years since quitting: 6.0  . Smokeless tobacco: Never Used  . Tobacco comment: Pt states smokes occasional cigar.  This is typically < 1 time per month  Substance and Sexual Activity  . Alcohol use: No    Alcohol/week: 0.0 standard drinks  . Drug use: No  . Sexual activity: Not on file  Other Topics Concern  . Not on file  Social History Narrative   Pt lives in [place].     Retired from [employer] and [employer].     Goes on mission trips to [place], gets regular exercise.   Caffeine use: very rare, Diet coke   Right handed    Social Determinants of Health   Financial Resource Strain:   . Difficulty of Paying Living Expenses: Not on file  Food Insecurity:   . Worried About Charity fundraiser in the Last Year: Not on file  . Ran Out of Food in the Last Year: Not on file  Transportation Needs:   . Lack of Transportation (Medical): Not on file  . Lack of Transportation (Non-Medical): Not on file  Physical Activity:   . Days of Exercise per Week: Not on file  . Minutes of Exercise per Session: Not on file  Stress:   . Feeling of Stress : Not on file  Social Connections:   . Frequency of Communication with Friends and Family: Not on file  . Frequency of Social Gatherings with  Friends and Family: Not on file  . Attends Religious Services: Not on file  . Active Member of Clubs or Organizations: Not on file  . Attends Archivist Meetings: Not on file  . Marital Status: Not on file  Intimate Partner Violence:   . Fear of Current or Ex-Partner: Not on file  . Emotionally Abused: Not on file  . Physically Abused: Not on file  . Sexually Abused: Not on file     PHYSICAL EXAM  Vitals:   09/24/19 0944  BP: 122/72  Pulse: 62  Temp: (!) 97 F (36.1 C)  Weight: 219 lb (99.3 kg)  Height: 5' 9.5" (1.765 m)    Body mass index is 31.88 kg/m.   General: The patient is well-developed and well-nourished and in no acute distress  HEENT:  Head is [place]/AT.  Sclera are anicteric.  Funduscopic exam shows normal optic discs and retinal vessels.  Neck: No carotid bruits are noted.  The neck is nontender.  Cardiovascular: The heart has a regular rate and rhythm with a normal S1 and S2. There were no murmurs, gallops or rubs.    Skin: Extremities are without rash or  edema.  Musculoskeletal:  Back is nontender  Neurologic Exam  Mental status: The patient is alert and oriented x 3 at the time of the examination. The patient has apparent normal recent and remote memory, with an apparently normal attention span and concentration ability.   Speech is normal.  Cranial nerves: Extraocular movements are full. Pupils are equal, round, and reactive to light and accomodation.  There is good facial sensation to soft touch bilaterally.Facial strength is normal.  Trapezius and sternocleidomastoid strength is normal. No dysarthria is noted. No obvious hearing deficits are noted.  Motor:  Muscle bulk is normal.   Tone is normal. Strength is  5 /  5 in all 4 extremities.   Sensory: Sensory testing is intact to pinprick, soft touch and vibration sensation in arms.  About 20 % loss of vibration sensation in right great toe and 50% loss left great toes.   Mild reduced pinprick  to ankle.    Coordination: Cerebellar testing reveals good finger-nose-finger and heel-to-shin bilaterally.  Gait and station: Station is normal.   Gait is normal. Tandem gait is mildly wide for age. Romberg is negative.   Reflexes: Deep tendon reflexes are symmetric and normal in arms, trace knees and absent at ankles.   Plantar responses are flexor.    DIAGNOSTIC DATA (LABS, IMAGING, TESTING) - I reviewed patient records, labs, notes, testing and imaging myself where available.  Lab Results  Component Value Date   WBC 8.0 10/29/2017   HGB 16.0 10/29/2017   HCT 47.3 10/29/2017   MCV 86.6 10/29/2017   PLT 269 10/29/2017      Component Value Date/Time   NA 138 03/17/2019 1440   NA 142 12/12/2017 1511   K 3.7 03/17/2019 1440   CL 103 03/17/2019 1440   CO2 28 03/17/2019 1440   GLUCOSE 132 (H) 03/17/2019 1440   BUN 17 03/17/2019 1440   BUN 24 12/12/2017 1511   CREATININE 1.05 03/17/2019 1440   CALCIUM 9.4 03/17/2019 1440   PROT 7.0 10/29/2017 1509   ALBUMIN 4.3 11/13/2016 0854   AST 15 10/29/2017 1509   ALT 17 10/29/2017 1509   ALKPHOS 71 11/13/2016 0854   BILITOT 0.4 10/29/2017 1509   GFRNONAA 71 03/17/2019 1440   GFRAA 82 03/17/2019 1440   Lab Results  Component Value Date   CHOL 167 11/19/2017   HDL 52 11/19/2017   LDLCALC 100 (H) 11/19/2017   TRIG 63 11/19/2017   CHOLHDL 3.2 11/19/2017   Lab Results  Component Value Date   HGBA1C CANCELED 03/17/2019   Lab Results  Component Value Date   VITAMINB12 328 03/17/2019   Lab Results  Component Value Date   TSH 0.68 03/17/2019       ASSESSMENT AND PLAN  Polyneuropathy - Plan: Cryoglobulin, Multiple Myeloma Panel (SPEP&IFE w/QIG), NCV with EMG(electromyography), Basic metabolic panel  Dysesthesia - Plan: Cryoglobulin, Multiple Myeloma Panel (SPEP&IFE w/QIG), NCV with EMG(electromyography), Basic metabolic panel  Left foot pain   In summary, Mr. Phommachanh is a 74 year old man with progressive left greater  than right foot pain over the last year.  On exam, he does have some numbness in the distal foot consistent with a polyneuropathy.  Additionally, he has had surgery in the right foot and was sequela of the Morton's neuromas and surgery could also be playing some role in the asymmetry of his symptoms.  To further evaluate this, we will check some blood work for potentially reversible causes of painful polyneuropathy and also check an NCV/EMG study.  To help with the pain, I will initiate oxcarbazepine 300 mg and titrate to 1 in the morning and 2 at night.  I will see him when he returns for the nerve conduction study.  He should call sooner if any new or worsening symptoms or if problems with the medication.  If he does not get a benefit from the oxcarbazepine, will consider other medications such as lamotrigine and supplement such as alpha lipoic acid.  Thank you for asking me to see Mr. Steege.  Please let me know if I can be of further assistance with him or other patients in the future.   Breelyn Icard A. Nadie Fiumara,  MD, [facility] 01/09/5731, 25:67 AM Certified in Neurology, Clinical Neurophysiology, Sleep Medicine and Neuroimaging  [facility] [address] [place], [place] [address] [phone]

## 2019-09-25 ENCOUNTER — Ambulatory Visit: Payer: Medicare Other | Attending: Internal Medicine

## 2019-09-25 DIAGNOSIS — Z23 Encounter for immunization: Secondary | ICD-10-CM | POA: Insufficient documentation

## 2019-09-25 NOTE — Progress Notes (Signed)
   Covid-19 Vaccination Clinic  Name:  Robert Page    MRN: [id number] DOB: [birth year]  09/25/2019  Robert Page was observed post Covid-19 immunization for 15 minutes without incidence. He was provided with Vaccine Information Sheet and instruction to access the V-Safe system.   Robert Page was instructed to call 911 with any severe reactions post vaccine: Marland Kitchen Difficulty breathing  . Swelling of your face and throat  . A fast heartbeat  . A bad rash all over your body  . Dizziness and weakness    Immunizations Administered    Name Date Dose VIS Date Route   Pfizer COVID-19 Vaccine 09/25/2019  2:07 PM 0.3 mL 08/15/2019 Intramuscular   Manufacturer: [place]   Lot: [id number]   [place]: [id number]

## 2019-09-29 LAB — MULTIPLE MYELOMA PANEL, SERUM
Albumin SerPl Elph-Mcnc: 4.1 g/dL (ref 2.9–4.4)
Albumin/Glob SerPl: 1.4 (ref 0.7–1.7)
Alpha 1: 0.3 g/dL (ref 0.0–0.4)
Alpha2 Glob SerPl Elph-Mcnc: 0.7 g/dL (ref 0.4–1.0)
B-Globulin SerPl Elph-Mcnc: 1.2 g/dL (ref 0.7–1.3)
Gamma Glob SerPl Elph-Mcnc: 0.8 g/dL (ref 0.4–1.8)
Globulin, Total: 3 g/dL (ref 2.2–3.9)
IgA/Immunoglobulin A, Serum: 219 mg/dL (ref 61–437)
IgG (Immunoglobin G), Serum: 1007 mg/dL (ref 603–1613)
IgM (Immunoglobulin M), Srm: 44 mg/dL (ref 15–143)
Total Protein: 7.1 g/dL (ref 6.0–8.5)

## 2019-09-29 LAB — CRYOGLOBULIN

## 2019-09-30 ENCOUNTER — Telehealth: Payer: Self-pay | Admitting: *Deleted

## 2019-09-30 NOTE — Telephone Encounter (Signed)
-----   Message from Britt Bottom, MD sent at 09/29/2019  9:13 PM EST ----- Please let the patient know that the lab work is fine.

## 2019-10-16 ENCOUNTER — Ambulatory Visit: Payer: Medicare Other | Attending: Internal Medicine

## 2019-10-16 DIAGNOSIS — Z23 Encounter for immunization: Secondary | ICD-10-CM

## 2019-10-16 NOTE — Progress Notes (Signed)
   Covid-19 Vaccination Clinic  Name:  Robert Page    MRN: [id number] DOB: [birth year]  10/16/2019  [name] was observed post Covid-19 immunization for 15 minutes without incidence. He was provided with Vaccine Information Sheet and instruction to access the V-Safe system.   [name] was instructed to call 911 with any severe reactions post vaccine: Marland Kitchen Difficulty breathing  . Swelling of your face and throat  . A fast heartbeat  . A bad rash all over your body  . Dizziness and weakness    Immunizations Administered    Name Date Dose VIS Date Route   Pfizer COVID-19 Vaccine 10/16/2019  3:02 PM 0.3 mL 08/15/2019 Intramuscular   Manufacturer: [place]   Lot: [id number]   [place]: [id number]

## 2019-11-04 ENCOUNTER — Ambulatory Visit (INDEPENDENT_AMBULATORY_CARE_PROVIDER_SITE_OTHER): Payer: Medicare Other | Admitting: Neurology

## 2019-11-04 ENCOUNTER — Encounter (INDEPENDENT_AMBULATORY_CARE_PROVIDER_SITE_OTHER): Payer: Medicare Other | Admitting: Neurology

## 2019-11-04 ENCOUNTER — Other Ambulatory Visit: Payer: Self-pay

## 2019-11-04 DIAGNOSIS — M5416 Radiculopathy, lumbar region: Secondary | ICD-10-CM

## 2019-11-04 DIAGNOSIS — G629 Polyneuropathy, unspecified: Secondary | ICD-10-CM | POA: Diagnosis not present

## 2019-11-04 DIAGNOSIS — R208 Other disturbances of skin sensation: Secondary | ICD-10-CM

## 2019-11-04 DIAGNOSIS — Z0289 Encounter for other administrative examinations: Secondary | ICD-10-CM

## 2019-11-04 MED ORDER — LAMOTRIGINE 100 MG PO TABS: ORAL_TABLET | ORAL | 5 refills | Status: DC

## 2019-11-04 NOTE — Progress Notes (Signed)
Full Name: Robert Page Gender: Male MRN #: [id number] Date of Birth: [birth year]    Visit Date: 11/04/2019 08:32 Age: 74 Years Examining Physician: Arlice Colt, MD  Referring Physician: Arlice Colt, MD  History: Robert Page is a 74 year old man with numbness and pain in his feet, left greater than right.  He has a history of right lumbar radiculopathy with surgery at L4-L5 in the past.  On examination, strength was normal and symmetric.  He had reduced sensation in the left foot relative to the right, lateral aspect more than dorsum.  Nerve conduction studies: The left peroneal motor response had a moderately reduced amplitude with normal latency and a mildly reduced conduction velocity.  The right peroneal motor response had normal amplitude, distal latency but mildly reduced velocity.  The tibial motor responses had mildly reduced amplitudes with normal distal latencies and mildly slowed conduction velocities.  Tibial F-wave responses were slowed bilaterally.  Sural and superficial peroneal sensory responses had normal peak latencies and mildly low to low normal amplitudes  Electromyography: Needle EMG of selected muscles of the left leg and right foot was performed.  There was moderate chronic denervation in the left tibialis anterior and left extensor digitorum brevis muscles, and mild chronic denervation in the left peroneus longus, left abductor hallucis, left gluteus medius and right extensor digitorum brevis muscles.   Impression: This NCV/EMG study shows the following: 1.  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  Left L5/S1 chronic radiculopathies.       Mitchell    Nerve / Sites Muscle Latency Ref. Amplitude Ref. Rel Amp Segments Distance Velocity Ref. Area    ms ms mV mV %  cm m/s m/s mVms  L Peroneal - EDB     Ankle EDB 5.8 ?6.5 0.8 ?2.0 100 Ankle - EDB 9   3.7     Fib head  EDB 14.3  0.7  84.6 Fib head - Ankle 30 35 ?44 4.7     Pop fossa EDB 16.8  0.6  87.7 Pop fossa - Fib head 10 39 ?44 4.4         Pop fossa - Ankle      R Peroneal - EDB     Ankle EDB 4.9 ?6.5 3.4 ?2.0 100 Ankle - EDB 9   9.8     Fib head EDB 12.2  2.8  81.6 Fib head - Ankle 30 41 ?44 7.6     Pop fossa EDB 14.6  2.5  88 Pop fossa - Fib head 10 41 ?44 8.0         Pop fossa - Ankle      L Tibial - AH     Ankle AH 4.7 ?5.8 2.8 ?4.0 100 Ankle - AH 9   6.3     Pop fossa AH 16.6  1.8  63.5 Pop fossa - Ankle 41 34 ?41 6.0  R Tibial - AH     Ankle AH 4.2 ?5.8 3.2 ?4.0 100 Ankle - AH 9   11.0     Pop fossa AH 15.5  1.7  52.2 Pop fossa - Ankle 41 36 ?41 8.3             SNC    Nerve / Sites Rec. Site Peak Lat Ref.  Amp Ref. Segments Distance    ms ms V V  cm  L Sural - Ankle (Calf)  Calf Ankle 4.4 ?4.4 5 ?6 Calf - Ankle 14  R Sural - Ankle (Calf)     Calf Ankle 3.8 ?4.4 7 ?6 Calf - Ankle 14  L Superficial peroneal - Ankle     Lat leg Ankle 4.0 ?4.4 3 ?6 Lat leg - Ankle 14  R Superficial peroneal - Ankle     Lat leg Ankle 3.8 ?4.4 2 ?6 Lat leg - Ankle 14             F  Wave    Nerve F Lat Ref.   ms ms  L Tibial - AH 73.6 ?56.0  R Tibial - AH 71.6 ?56.0         EMG Summary Table    Spontaneous MUAP Recruitment  Muscle IA Fib PSW Fasc Other Amp Dur. Poly Pattern  L. Vastus medialis Normal None None None _______ Normal Normal 1+ Reduced  L. Tibialis anterior Normal None None None _______ Increased Increased 2+ Discrete  L. Peroneus longus Normal None None None _______ Normal Increased 1+ Reduced  L. Gastrocnemius (Medial head) Normal None None None _______ Normal Normal 1+ Normal  L. Abductor hallucis Normal None None None _______ Normal Increased 1+ Reduced  L. Extensor digitorum brevis Normal None None None _______ Increased Increased 3+ Discrete  L. Gluteus medius Normal None None None _______ Increased Increased 1+ Reduced  L. Iliopsoas Normal None None None _______ Normal Normal  Normal Normal  R. Extensor digitorum brevis Normal None None None _______ Normal Increased 1+ Reduced     History: Robert Page is a 74 year old man with numbness and pain in his feet, left greater than right.  He has a history of right lumbar radiculopathy with surgery at L4-L5 in the past.  He has not received benefit from gabapentin or Lyrica in the past.  At the last visit oxcarbazepine was added.  He did not note any benefit.  I reviewed the MRI of the lumbar spine from February 2012.  It showed multilevel degenerative changes most advanced at L4-L5 where there is probable right L5 nerve root compression.  Physical exam:  He is a well-developed well-nourished man in no acute distress.  He is alert and oriented.  On examination, strength was normal and symmetric.  He had reduced sensation in the left foot relative to the right, lateral aspect more than dorsum.  Deep tendon reflexes were trace at the knees and absent at the ankles.   Lumbar radiculopathy - Plan: MR LUMBAR SPINE WO CONTRAST  Polyneuropathy - Plan: NCV with EMG(electromyography)  Dysesthesia - Plan: NCV with EMG(electromyography), MR LUMBAR SPINE WO CONTRAST  Plan: 1.   He will stop the oxcarbazepine and titrate up on lamotrigine to a dose of 100 mg twice daily. 2.   Stay active exercise as tolerated. 3.   Return to see [place] in 3 months or sooner if there are new or worsening symptoms.

## 2019-11-07 ENCOUNTER — Telehealth: Payer: Self-pay | Admitting: Neurology

## 2019-11-07 NOTE — Telephone Encounter (Signed)
Medicare/[facility] Robert Page: NPR via [facility] website ordre sent to [facility]. No auth they will reach out to the patient to schedule.

## 2019-11-22 ENCOUNTER — Other Ambulatory Visit: Payer: Self-pay | Admitting: Family Medicine

## 2019-11-22 DIAGNOSIS — I10 Essential (primary) hypertension: Secondary | ICD-10-CM

## 2019-11-27 ENCOUNTER — Ambulatory Visit
Admission: RE | Admit: 2019-11-27 | Discharge: 2019-11-27 | Disposition: A | Discharge status: Home / Self Care | Payer: Medicare Other | Source: Ambulatory Visit | Attending: Neurology | Admitting: Neurology

## 2019-11-27 DIAGNOSIS — R208 Other disturbances of skin sensation: Secondary | ICD-10-CM | POA: Diagnosis not present

## 2019-11-27 DIAGNOSIS — M5416 Radiculopathy, lumbar region: Secondary | ICD-10-CM | POA: Diagnosis not present

## 2019-11-27 IMAGING — MR MR ANKLE*R* W/O CM
4 of 6 series · 22 of 40 positions shown · non-contrast
Comparison: Right foot x-rays dated January 27, 2016.

CLINICAL DATA: Fall chronic pain at the base of the fifth
metatarsal for the past 2-3 years. Evaluate for peroneal tendon
abnormality.

EXAM:
MRI OF THE RIGHT ANKLE WITHOUT CONTRAST
TECHNIQUE: Multiplanar, multisequence MR imaging of the ankle was performed. No
intravenous contrast was administered.

[Series 4: T2 fat-sat · axial · 3.5mm · 0.35mm/px · z∈[-119,+26]mm · 6 of 34 slices shown (1 of 3)]
[im 1/34]
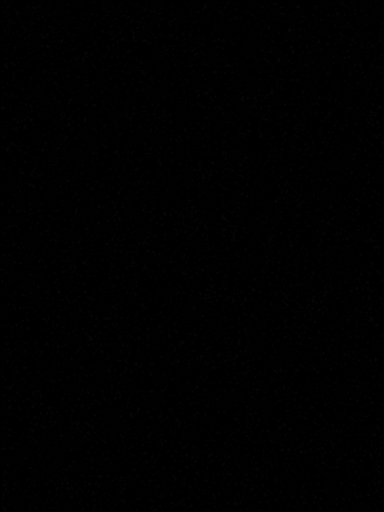
[im 7/34]
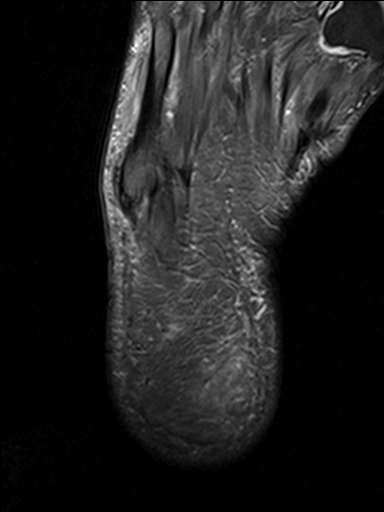
[im 14/34]
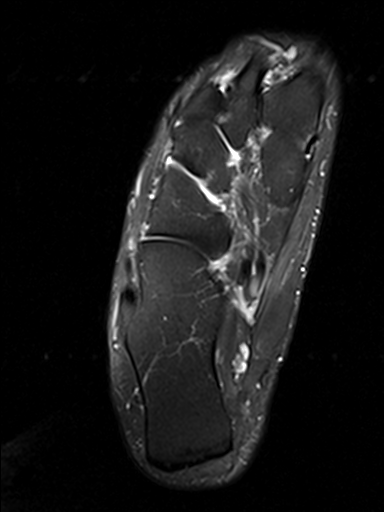
[im 20/34]
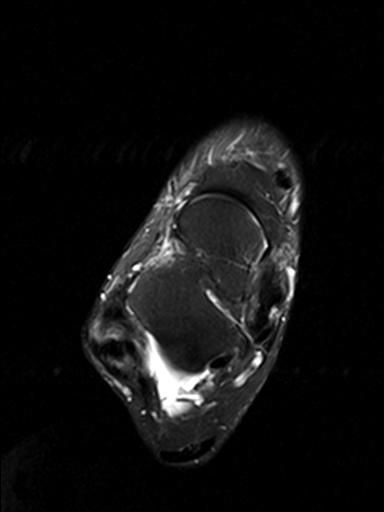
[im 27/34]
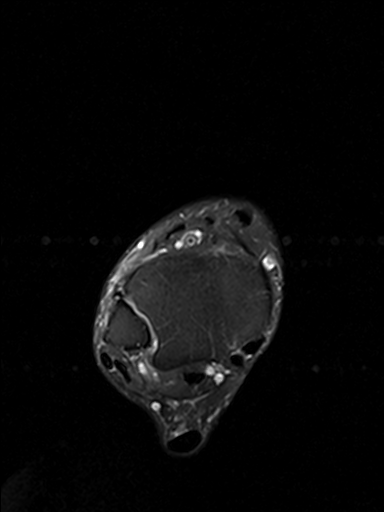
[im 34/34]
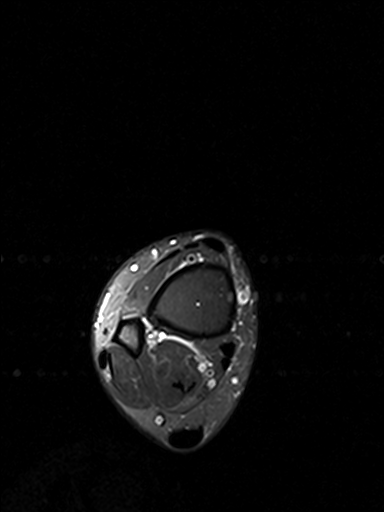

[Series 5: PD fat-sat · axial · 3.5mm · 0.35mm/px · z∈[-119,+26]mm · 7 of 34 slices shown]
[im 1/34]
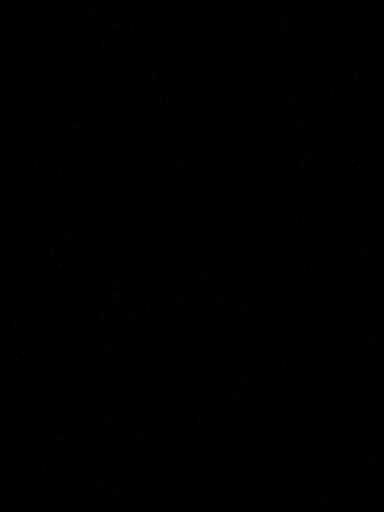
[im 6/34]
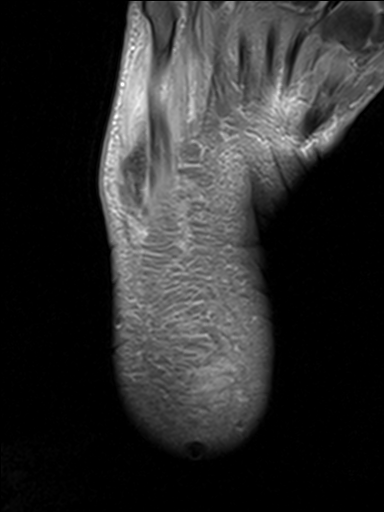
[im 12/34]
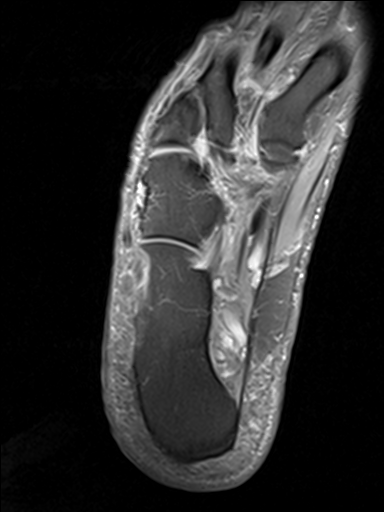
[im 17/34]
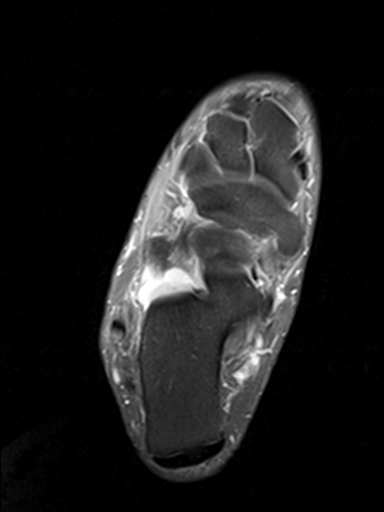
[im 23/34]
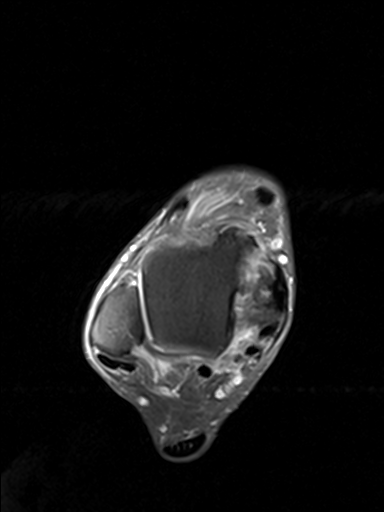
[im 28/34]
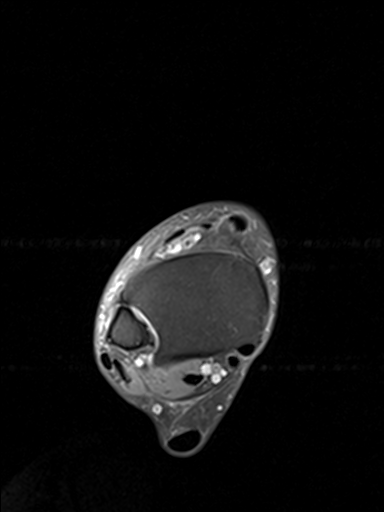
[im 34/34]
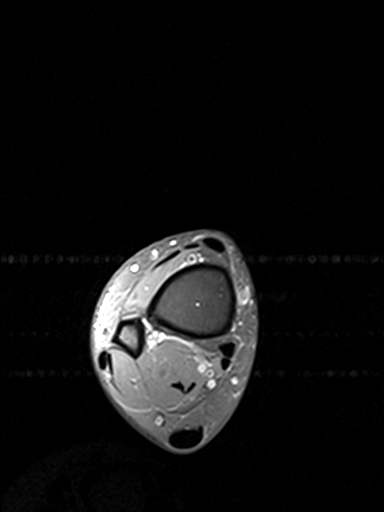

[Series 7: T2 fat-sat · sagittal · 3.0mm · 0.35mm/px · 6 of 30 slices shown (2 of 3)]
[im 1/30]
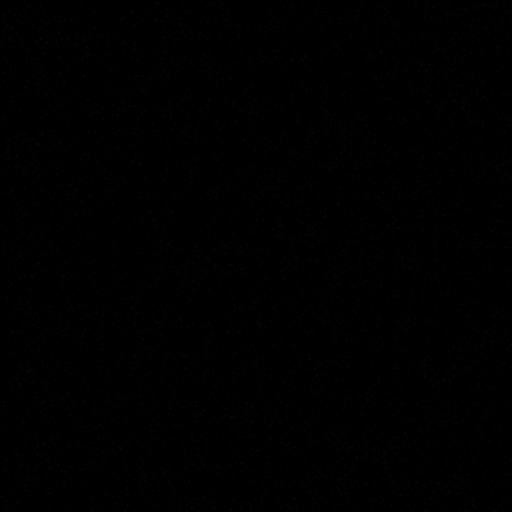
[im 6/30]
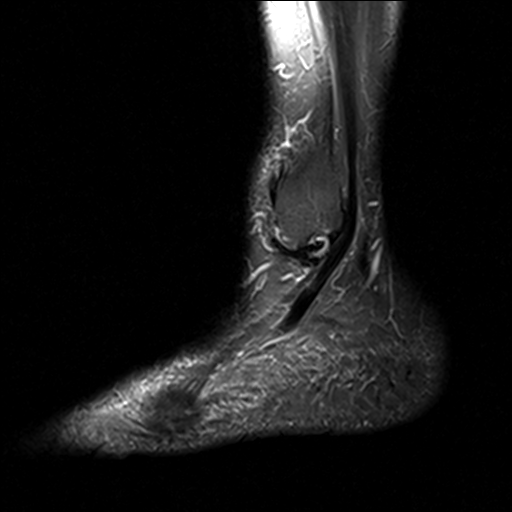
[im 12/30]
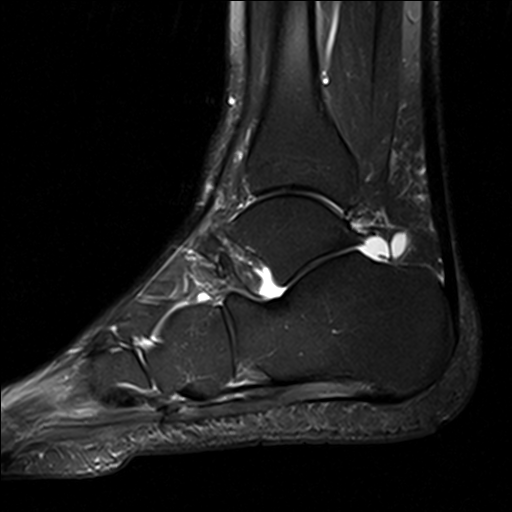
[im 18/30]
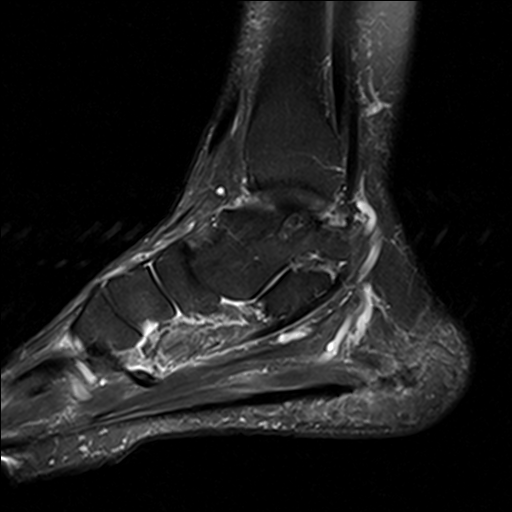
[im 24/30]
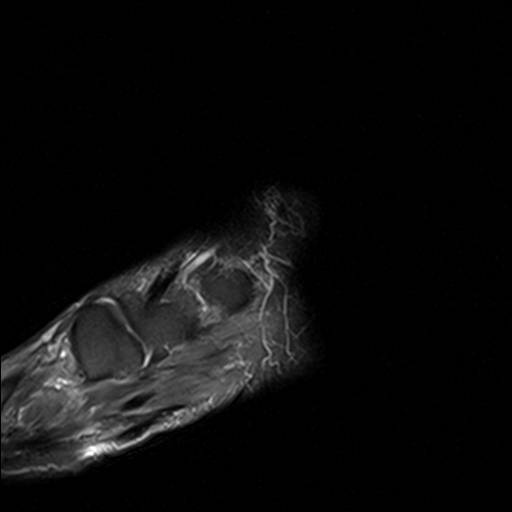
[im 30/30]
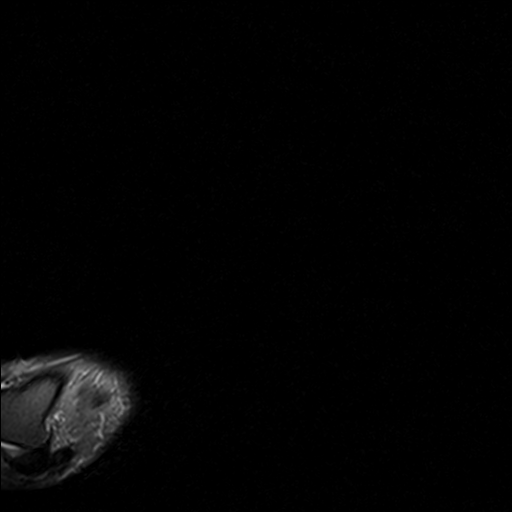

[Series 8: T2 fat-sat · coronal · 4.0mm · 0.31mm/px · 3 of 38 slices shown (3 of 3)]
[im 6/38]
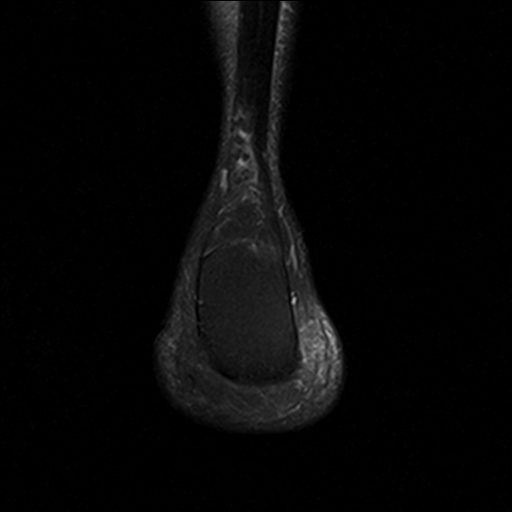
[im 22/38]
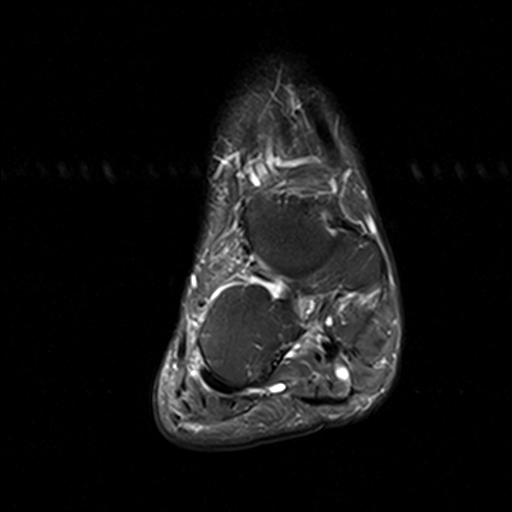
[im 32/38]
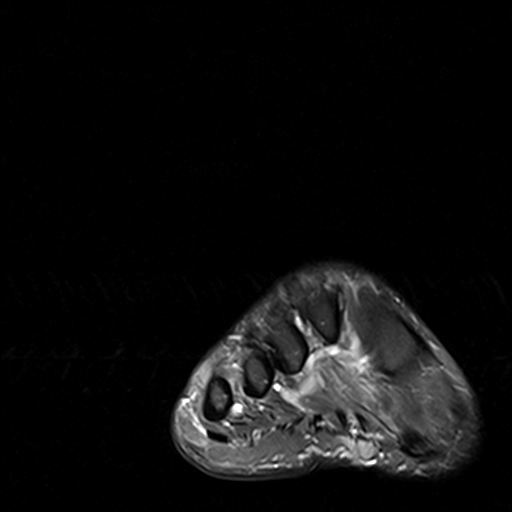

[22 of 40 positions shown; findings below may reference images not displayed]

FINDINGS: TENDONS

Peroneal: Intact peroneus longus and peroneus brevis tendons.

Posteromedial: Intact tibialis posterior, flexor hallucis longus and
flexor digitorum longus tendons.

Anterior: Intact tibialis anterior, extensor hallucis longus and
extensor digitorum longus tendons.

Achilles: Intact.

Plantar Fascia: Intact. Mild thickening with slightly increased
intermediate signal in the proximal central band likely reflects
sequelae of prior plantar fasciitis.

LIGAMENTS

Lateral: Intact.

Medial: Intact.

CARTILAGE

Ankle Joint: No joint effusion or chondral defect.

Subtalar Joints/Sinus Tarsi: Small posterior subtalar joint
effusion. Normal signal in the sinus tarsi.

Bones: No marrow signal abnormality. No fracture or dislocation.

Other: No fluid collection or hematoma.
IMPRESSION: 1. Normal peroneal tendons.
2. No acute osseous abnormality.
3. Small posterior subtalar joint effusion.

## 2019-12-02 ENCOUNTER — Telehealth: Payer: Self-pay | Admitting: *Deleted

## 2019-12-02 NOTE — Telephone Encounter (Signed)
-----   Message from Britt Bottom, MD sent at 12/01/2019  6:36 PM EDT ----- Please let him know that the MRI of the lumbar spine showed degenerative changes in the bottom 3 levels (L3-L4, L4-L5 and L5-S1).    If pain is not doing any better, we can set up an ESI at L5-S1

## 2020-01-06 ENCOUNTER — Other Ambulatory Visit: Payer: Self-pay | Admitting: *Deleted

## 2020-01-06 DIAGNOSIS — M5416 Radiculopathy, lumbar region: Secondary | ICD-10-CM

## 2020-01-12 ENCOUNTER — Other Ambulatory Visit: Payer: Self-pay | Admitting: Family Medicine

## 2020-01-12 DIAGNOSIS — I10 Essential (primary) hypertension: Secondary | ICD-10-CM

## 2020-01-12 DIAGNOSIS — G629 Polyneuropathy, unspecified: Secondary | ICD-10-CM

## 2020-01-12 DIAGNOSIS — E041 Nontoxic single thyroid nodule: Secondary | ICD-10-CM

## 2020-01-15 ENCOUNTER — Ambulatory Visit: Payer: Medicare Other

## 2020-01-16 ENCOUNTER — Other Ambulatory Visit: Payer: Self-pay | Admitting: Family Medicine

## 2020-01-16 DIAGNOSIS — I10 Essential (primary) hypertension: Secondary | ICD-10-CM

## 2020-01-16 DIAGNOSIS — G609 Hereditary and idiopathic neuropathy, unspecified: Secondary | ICD-10-CM

## 2020-01-19 ENCOUNTER — Ambulatory Visit
Admission: RE | Admit: 2020-01-19 | Discharge: 2020-01-19 | Disposition: A | Discharge status: Home / Self Care | Payer: Medicare Other | Source: Ambulatory Visit | Attending: Neurology | Admitting: Neurology

## 2020-01-19 DIAGNOSIS — M48061 Spinal stenosis, lumbar region without neurogenic claudication: Secondary | ICD-10-CM | POA: Diagnosis not present

## 2020-01-19 DIAGNOSIS — M5116 Intervertebral disc disorders with radiculopathy, lumbar region: Secondary | ICD-10-CM | POA: Diagnosis not present

## 2020-01-19 DIAGNOSIS — M5416 Radiculopathy, lumbar region: Secondary | ICD-10-CM

## 2020-01-19 MED ORDER — IOPAMIDOL (ISOVUE-M 200) INJECTION 41%
1.0000 mL | Freq: Once | INTRAMUSCULAR | Status: AC
  Administered 2020-01-19: 1 mL via EPIDURAL

## 2020-01-19 MED ORDER — METHYLPREDNISOLONE ACETATE 40 MG/ML INJ SUSP (RADIOLOG
120.0000 mg | Freq: Once | INTRAMUSCULAR | Status: AC
  Administered 2020-01-19: 120 mg via EPIDURAL

## 2020-01-19 NOTE — Discharge Instructions (Signed)

## 2020-01-19 NOTE — Chronic Care Management (AMB) (Deleted)
Chronic Care Management Pharmacy  Name: Robert Page  MRN: [id number] DOB: 11/11/1945   Chief Complaint/ HPI  Robert Page,  74 y.o. , male presents for their Initial CCM visit with the clinical pharmacist In office.  PCP : Susy Frizzle, MD  Their chronic conditions include: hypertension, allergic rhinitis, GERD  Office Visits:  07/24/2020 ([name]) - Patient has been diagnosed with peripheral neuropathy, was on amitriptyline and pain worsened and he experienced nausea.  Started Lyrica 75mg  twice daily with possible uptitration if helps.  Consult Visit:  08/08/2019 [facility]) - recommended 150 minutes per week of cardio exercise  09/24/2019 [facility] [place] Neuro) - started on Oxcarbazepine 300mg  for neuropathy pain  11/04/2019 [facility]) - stopped oxcarbazepine and started lamotrigine with titration up to 100mg  twice daily.  Appointment was set up for epidural steroid injection to see if this helps with the pain as patient says it is starting to rise above the ankles.   Medications: Outpatient Encounter Medications as of 01/20/2020  Medication Sig  . amLODipine (NORVASC) 5 MG tablet TAKE 1 TABLET BY MOUTH EVERY DAY  . aspirin EC 81 MG tablet Take 81 mg by mouth daily.  . fexofenadine (ALLEGRA) 180 MG tablet Take 180 mg by mouth daily.   . fluticasone (FLONASE) 50 MCG/ACT nasal spray Place 2 sprays into both nostrils daily. (Patient taking differently: Place 2 sprays into both nostrils as needed. )  . hydrochlorothiazide (HYDRODIURIL) 25 MG tablet TAKE 1 TABLET BY MOUTH EVERY DAY  . lamoTRIgine (LAMICTAL) 100 MG tablet Take 0.5 pill qd x 5 days, then 0.5 pills bid x 5 days, then 0.5 pills in AM and 1 pill in PM x 5 days, then 1 pill po bid.  Stop if rash develops.  Marland Kitchen losartan (COZAAR) 100 MG tablet TAKE 1 TABLET BY MOUTH EVERY DAY  . omeprazole (PRILOSEC) 20 MG capsule Take 20 mg by mouth daily.  . Oxcarbazepine (TRILEPTAL) 300 MG tablet Take one pill po qAm and two  pills po qHS   No facility-administered encounter medications on file as of 01/20/2020.     Current Diagnosis/Assessment:  Goals Addressed   None     Hypertension   BP today is:  {CHL HP UPSTREAM Pharmacist BP ranges:[phone]}  Office blood pressures are  BP Readings from Last 3 Encounters:  09/24/19 122/72  08/08/19 124/70  07/25/19 130/72    Patient has failed these meds in the past: ***  Patient checks BP at home {CHL HP BP Monitoring Frequency:[phone]}   Patient is currently {CHL Controlled/Uncontrolled:7620154935} on the following medications: amlodipine 5mg  daily, HCTZ 25mg , losartan 100mg  daily   Patient home BP readings are ranging: ***  We discussed {CHL HP Upstream Pharmacy discussion:[phone]}  Plan  Continue {CHL HP Upstream Pharmacy Plans:[phone]}    ,  GERD    Patient has failed these meds in past: *** Patient is currently {CHL Controlled/Uncontrolled:7620154935} on the following medications: omeprazole 20mg  daily  We discussed:  {CHL HP Upstream Pharmacy discussion:[phone]}  Plan  Continue {CHL HP Upstream Pharmacy Plans:[phone]}   Allergic Rhinits    Patient has failed these meds in past: *** Patient is currently {CHL Controlled/Uncontrolled:7620154935} on the following medications: Allegra 180mg  daily, fluticasone 52mcg nasal spray  We discussed:  {CHL HP Upstream Pharmacy discussion:[phone]}  Plan  Continue {CHL HP Upstream Pharmacy [id number]   Vaccines   Reviewed and discussed patient's vaccination history.    Immunization History  Administered Date(s) Administered  . H1N1 09/23/2008  . Influenza Split  06/04/2014  . Influenza,inj,Quad PF,6+ Mos 08/08/2013, 05/17/2015, 10/17/2016  . PFIZER SARS-COV-2 Vaccination 09/25/2019, 10/16/2019  . Pneumococcal Conjugate-13 10/17/2016  . Pneumococcal Polysaccharide-23 03/11/2013  . Td 03/04/2004  . Tdap 10/17/2016    Plan  Recommended patient receive  *** vaccine in *** office/pharmacy.   Medication Management   . Miscellaneous medications: *** . OTC's: *** . Patient currently uses *** pharmacy.  Phone #  [phone] *** . Patient reports using *** method to organize medications and promote adherence. . Patient reports/denies missed doses of medication ***    Beverly Milch, PharmD Clinical Pharmacist [place] [phone]

## 2020-01-20 ENCOUNTER — Ambulatory Visit: Payer: Medicare Other

## 2020-01-27 ENCOUNTER — Other Ambulatory Visit: Payer: Self-pay

## 2020-01-27 ENCOUNTER — Ambulatory Visit: Payer: Medicare Other | Admitting: Pharmacist

## 2020-01-27 DIAGNOSIS — I10 Essential (primary) hypertension: Secondary | ICD-10-CM

## 2020-01-27 DIAGNOSIS — G629 Polyneuropathy, unspecified: Secondary | ICD-10-CM

## 2020-01-27 NOTE — Patient Instructions (Addendum)
Thank you for meeting with me today!  I look forward to working with you to help you meet all of your healthcare goals and answer any questions you may have.  Feel free to contact me anytime!  Visit Information  Goals Addressed            This Visit's Progress   . Pharmacy Care Plan:       CARE PLAN ENTRY  Current Barriers:  . Chronic Disease Management support, education, and care coordination needs related to hypertension and neuropathy/foot numbness.   Hypertension . Pharmacist Clinical Goal(s): o Over the next 180 days, patient will work with PharmD and providers to maintain BP goal <140/90 . Current regimen:  o Amlodipine 5mg , HCTZ 25mg , losartan 100mg  . Interventions: o Comprehensive medication review o BP controlled, continue current therapy . Patient self care activities - Over the next 180 days, patient will: o Check BP based on symptoms, document, and provide at future appointments o Ensure daily salt intake < 2300 mg/day Neuropathy/Leg numbness . Pharmacist Clinical Goal(s) o Over the next 180 days, patient will work with PharmD and providers to optimize medications and relieve symptoms of foot pain/neuropathy. . Current regimen:  o No medications currently . Interventions: o Comprehensive medication review . Patient self care activities - Over the next 180 days, patient will: o Follow up with ortho for second opinion on legs/feet. o Continue to report increasing or stagnant symptoms to PharmD or providers.          Robert Page was given information about Chronic Care Management services today including:  1. CCM service includes personalized support from designated clinical staff supervised by his physician, including individualized plan of care and coordination with other care providers 2. 24/7 contact phone numbers for assistance for urgent and routine care needs. 3. Standard insurance, coinsurance, copays and deductibles apply for chronic care management  only during months in which we provide at least 20 minutes of these services. Most insurances cover these services at 100%, however patients may be responsible for any copay, coinsurance and/or deductible if applicable. This service may help you avoid the need for more expensive face-to-face services. 4. Only one practitioner may furnish and bill the service in a calendar month. 5. The patient may stop CCM services at any time (effective at the end of the month) by phone call to the office staff.  Patient agreed to services and verbal consent obtained.   The patient verbalized understanding of instructions provided today and agreed to receive a mailed copy of patient instruction and/or educational materials. Telephone follow up appointment with pharmacy team member scheduled for: 07/26/2020 @ 4:00 PM   Beverly Milch, PharmD Clinical Pharmacist [place] [phone]   Peripheral Neuropathy Peripheral neuropathy is a type of nerve damage. It affects nerves that carry signals between the spinal cord and the arms, legs, and the rest of the body (peripheral nerves). It does not affect nerves in the spinal cord or brain. In peripheral neuropathy, one nerve or a group of nerves may be damaged. Peripheral neuropathy is a broad category that includes many specific nerve disorders, like diabetic neuropathy, hereditary neuropathy, and carpal tunnel syndrome. What are the causes? This condition may be caused by:  Diabetes. This is the most common cause of peripheral neuropathy.  Nerve injury.  Pressure or stress on a nerve that lasts a long time.  Lack (deficiency) of B vitamins. This can result from alcoholism, poor diet, or a restricted diet.  Infections.  Autoimmune diseases, such as rheumatoid arthritis and systemic lupus erythematosus.  Nerve diseases that are passed from parent to child (inherited).  Some medicines, such as cancer medicines  (chemotherapy).  Poisonous (toxic) substances, such as lead and mercury.  Too little blood flowing to the legs.  Kidney disease.  Thyroid disease. In some cases, the cause of this condition is not known. What are the signs or symptoms? Symptoms of this condition depend on which of your nerves is damaged. Common symptoms include:  Loss of feeling (numbness) in the feet, hands, or both.  Tingling in the feet, hands, or both.  Burning pain.  Very sensitive skin.  Weakness.  Not being able to move a part of the body (paralysis).  Muscle twitching.  Clumsiness or poor coordination.  Loss of balance.  Not being able to control your bladder.  Feeling dizzy.  Sexual problems. How is this diagnosed? Diagnosing and finding the cause of peripheral neuropathy can be difficult. Your health care provider will take your medical history and do a physical exam. A neurological exam will also be done. This involves checking things that are affected by your brain, spinal cord, and nerves (nervous system). For example, your health care provider will check your reflexes, how you move, and what you can feel. You may have other tests, such as:  Blood tests.  Electromyogram (EMG) and nerve conduction tests. These tests check nerve function and how well the nerves are controlling the muscles.  Imaging tests, such as CT scans or MRI to rule out other causes of your symptoms.  Removing a small piece of nerve to be examined in a lab (nerve biopsy). This is rare.  Removing and examining a small amount of the fluid that surrounds the brain and spinal cord (lumbar puncture). This is rare. How is this treated? Treatment for this condition may involve:  Treating the underlying cause of the neuropathy, such as diabetes, kidney disease, or vitamin deficiencies.  Stopping medicines that can cause neuropathy, such as chemotherapy.  Medicine to relieve pain. Medicines may include: ? Prescription or  over-the-counter pain medicine. ? Antiseizure medicine. ? Antidepressants. ? Pain-relieving patches that are applied to painful areas of skin.  Surgery to relieve pressure on a nerve or to destroy a nerve that is causing pain.  Physical therapy to help improve movement and balance.  Devices to help you move around (assistive devices). Follow these instructions at home: Medicines  Take over-the-counter and prescription medicines only as told by your health care provider. Do not take any other medicines without first asking your health care provider.  Do not drive or use heavy machinery while taking prescription pain medicine. Lifestyle   Do not use any products that contain nicotine or tobacco, such as cigarettes and e-cigarettes. Smoking keeps blood from reaching damaged nerves. If you need help quitting, ask your health care provider.  Avoid or limit alcohol. Too much alcohol can cause a vitamin B deficiency, and vitamin B is needed for healthy nerves.  Eat a healthy diet. This includes: ? Eating foods that are high in fiber, such as fresh fruits and vegetables, whole grains, and beans. ? Limiting foods that are high in fat and processed sugars, such as fried or sweet foods. General instructions   If you have diabetes, work closely with your health care provider to keep your blood sugar under control.  If you have numbness in your feet: ? Check every day for signs of injury or infection. Watch for  redness, warmth, and swelling. ? Wear padded socks and comfortable shoes. These help protect your feet.  Develop a good support system. Living with peripheral neuropathy can be stressful. Consider talking with a mental health specialist or joining a support group.  Use assistive devices and attend physical therapy as told by your health care provider. This may include using a walker or a cane.  Keep all follow-up visits as told by your health care provider. This is  important. Contact a health care provider if:  You have new signs or symptoms of peripheral neuropathy.  You are struggling emotionally from dealing with peripheral neuropathy.  Your pain is not well-controlled. Get help right away if:  You have an injury or infection that is not healing normally.  You develop new weakness in an arm or leg.  You fall frequently. Summary  Peripheral neuropathy is when the nerves in the arms, or legs are damaged, resulting in numbness, weakness, or pain.  There are many causes of peripheral neuropathy, including diabetes, pinched nerves, vitamin deficiencies, autoimmune disease, and hereditary conditions.  Diagnosing and finding the cause of peripheral neuropathy can be difficult. Your health care provider will take your medical history, do a physical exam, and do tests, including blood tests and nerve function tests.  Treatment involves treating the underlying cause of the neuropathy and taking medicines to help control pain. Physical therapy and assistive devices may also help. This information is not intended to replace advice given to you by your health care provider. Make sure you discuss any questions you have with your health care provider. Document Revised: 08/03/2017 Document Reviewed: 10/30/2016 Elsevier Patient Education  2020 [employer].

## 2020-01-27 NOTE — Chronic Care Management (AMB) (Addendum)
Chronic Care Management Pharmacy  Name: Robert Page  MRN: [id number] DOB: [birth year]  Chief Complaint/ HPI  Robert Page,  74 y.o. , male presents for their Initial CCM visit with the clinical pharmacist In office.  PCP : Robert Frizzle, MD  Their chronic conditions include: hypertension, allregic rhinitis, GERD. Office Visits:  07/25/2019 [name])  - peripheral neuropathy was on gabapentin and Lyrica, has since stopped both of those  Consult Visit:  09/24/2019 [facility]) - started oxcarbazepine 300mg  titrating up to 1 qam and 2 qpm, nerve pain in foot  Medications: Outpatient Encounter Medications as of 01/27/2020  Medication Sig   amLODipine (NORVASC) 5 MG tablet TAKE 1 TABLET BY MOUTH EVERY DAY   aspirin EC 81 MG tablet Take 81 mg by mouth daily.   fexofenadine (ALLEGRA) 180 MG tablet Take 180 mg by mouth daily.    fluticasone (FLONASE) 50 MCG/ACT nasal spray Place 2 sprays into both nostrils daily. (Patient taking differently: Place 2 sprays into both nostrils as needed. )   hydrochlorothiazide (HYDRODIURIL) 25 MG tablet TAKE 1 TABLET BY MOUTH EVERY DAY   losartan (COZAAR) 100 MG tablet TAKE 1 TABLET BY MOUTH EVERY DAY   omeprazole (PRILOSEC) 20 MG capsule Take 20 mg by mouth daily.   lamoTRIgine (LAMICTAL) 100 MG tablet Take 0.5 pill qd x 5 days, then 0.5 pills bid x 5 days, then 0.5 pills in AM and 1 pill in PM x 5 days, then 1 pill po bid.  Stop if rash develops. (Patient not taking: Reported on 01/27/2020)   Oxcarbazepine (TRILEPTAL) 300 MG tablet Take one pill po qAm and two pills po qHS (Patient not taking: Reported on 01/27/2020)   No facility-administered encounter medications on file as of 01/27/2020.     Current Diagnosis/Assessment:    [name]: Low Risk    Difficulty of Paying Living Expenses: Not hard at all     Goals Addressed             This Visit's Progress    Pharmacy Care Plan:       CARE PLAN ENTRY  Current Barriers:    Chronic Disease Management support, education, and care coordination needs related to hypertension and neuropathy/foot numbness.   Hypertension Pharmacist Clinical Goal(s): Over the next 180 days, patient will work with PharmD and providers to maintain BP goal <140/90 Current regimen:  Amlodipine 5mg , HCTZ 25mg , losartan 100mg  Interventions: Comprehensive medication review BP controlled, continue current therapy Patient self care activities - Over the next 180 days, patient will: Check BP based on symptoms, document, and provide at future appointments Ensure daily salt intake < 2300 mg/day Neuropathy/Leg numbness Pharmacist Clinical Goal(s) Over the next 180 days, patient will work with PharmD and providers to optimize medications and relieve symptoms of foot pain/neuropathy. Current regimen:  No medications currently Interventions: Comprehensive medication review Patient self care activities - Over the next 180 days, patient will: Follow up with ortho for second opinion on legs/feet. Continue to report increasing or stagnant symptoms to PharmD or providers.   Initial goal documentation        Hypertension    Office blood pressures are  BP Readings from Last 3 Encounters:  01/19/20 125/81  09/24/19 122/72  08/08/19 124/70    Patient has failed these meds in the past: olmesartan  No currently checking blood pressure at home.    Patient is currently controlled on the following medications: amlodipine 5 mg, HCTZ 25mg , losartan 100mg   No  swelling in feet, not currently monitoring BP but no real indication to since BP is well controlled at all office visits.  No med concerns noted all dose appropriately, patient reports compliance.  Plan  Continue current medications     and  GERD    Patient has failed these meds in past: famotidine Patient is currently controlled on the following medications: omeprazole 20mg    Currently controlled with omeprazole,  symptomatic when does not take medication, reports compliance daily  Plan  Continue current medications  Allergic Rhinitis    Patient has failed these meds in past: none noted Patient is currently controlled on the following medications: Allegra 180mg , Flonase 31mcg,   Currently taking these and they are alleviating allergy symptoms.  No concerns with price, reports compliance and verbalizes correct way of taking.  Plan  Continue current medications, contact providers with any increase in symptoms.  Foot Pain/Numbness    Patient has failed these meds in past: oxcarbazepine, lamotrigine, gabapentin, Lyrica, amitriptyline Patient pain currently uncontrolled.    This is his main concern during today's visit.  Feels like he has exhausted almost all options with foot pain/numbness.  Numbness now sometimes up to his knees.  Has trid almost every pharmacological treatment available with no relief.  Going for second opinion with ortho tomorrow, hoping for answers.    Plan  Continue current medications as needed, contact provider or PharmD if continues to progress and no relief is found.  Vaccines   Reviewed and discussed patient's vaccination history.    Immunization History  Administered Date(s) Administered   H1N1 09/23/2008   Influenza Split 06/04/2014   Influenza,inj,Quad PF,6+ Mos 08/08/2013, 05/17/2015, 10/17/2016   PFIZER SARS-COV-2 Vaccination 09/25/2019, 10/16/2019   Pneumococcal Conjugate-13 10/17/2016   Pneumococcal Polysaccharide-23 03/11/2013   Td 03/04/2004   Tdap 10/17/2016    Plan  Recommended patient receive shingles vaccine in office/pharmacy.   Medication Management   Miscellaneous medications: OTC's:  ASA 81mg  Patient currently uses [facility] pharmacy.  Phone #  [phone] Patient reports using pill box method to organize medications and promote adherence. Patient denies missed doses of medication.  Beverly Milch, PharmD Clinical  Pharmacist [place] [phone] I have collaborated with the care management provider regarding care management and care coordination activities outlined in this encounter and have reviewed this encounter including documentation in the note and care plan. I am certifying that I agree with the content of this note and encounter as supervising physician.

## 2020-01-28 ENCOUNTER — Telehealth: Payer: Self-pay | Admitting: Family Medicine

## 2020-01-28 ENCOUNTER — Encounter: Payer: Self-pay | Admitting: Family Medicine

## 2020-01-28 ENCOUNTER — Ambulatory Visit (INDEPENDENT_AMBULATORY_CARE_PROVIDER_SITE_OTHER): Payer: Medicare Other | Admitting: Family Medicine

## 2020-01-28 VITALS — BP 127/72 | HR 59 | Ht 69.5 in | Wt 216.0 lb

## 2020-01-28 DIAGNOSIS — G629 Polyneuropathy, unspecified: Secondary | ICD-10-CM | POA: Diagnosis not present

## 2020-01-28 DIAGNOSIS — M5416 Radiculopathy, lumbar region: Secondary | ICD-10-CM | POA: Diagnosis not present

## 2020-01-28 DIAGNOSIS — R252 Cramp and spasm: Secondary | ICD-10-CM

## 2020-01-28 MED ORDER — AMITRIPTYLINE HCL 25 MG PO TABS
25.0000 mg | ORAL_TABLET | Freq: Every day | ORAL | 3 refills | Status: DC
Start: 2020-01-28 — End: 2020-03-11

## 2020-01-28 NOTE — Telephone Encounter (Signed)
Per check out instructions pt is to f/u in 6 weeks around 7/7 as of right now Amy is booked til 8/26. Pt requesting a call once something opens up within the allotted time frame. Please advise

## 2020-01-28 NOTE — Progress Notes (Signed)
I have read the note, and I agree with the clinical assessment and plan.  Roselin Wiemann A. Gala Padovano, MD, PhD, FAAN Certified in Neurology, Clinical Neurophysiology, Sleep Medicine, Pain Medicine and Neuroimaging  [facility] [address], [address] [place], [place] [address] [phone]  

## 2020-01-28 NOTE — Progress Notes (Signed)
PATIENT: Robert Robert [name] Robert [name] DOB: 1946-04-17  REASON FOR VISIT: follow up HISTORY FROM: patient  Chief Complaint  Patient presents with  . Follow-up    rm 2 here for a f/u on feet. pt is having no new sx     HISTORY OF PRESENT ILLNESS: Today 01/28/20 Robert Robert [name] Robert [name] is a 74 y.o. male here today for follow up for polyneuropathy. He was started on oxcarbamazepine which was ineffective. He was switched to lamotrigine. After a few weeks, he noticed a rash and this medication was stopped.  He does not feel that it helped much at all.  Lumbar MRI showed degeneration. NCV/EMG showed motor polyneuropathy, left peroneal neuropathy and L5/S1 radiculopathy. He continues to have tightness of both feet. He feels that his feet are swollen but aren't. He is having terrible cramping of his lateral calves and tops of feet. He has tried Lyrica and gabapentin in the past that was not helpful. He has been seen by ortho and rheumatology. He had LSI on 5/17. He was able to travel by car to [place] and felt that symptoms were improved. Once returning a few days ago, symptoms have worsened. He is not sleeping well. He is not sure if he snores. He is up and down all night.  He does endorse frequent bathroom trips during the night.  He is always tired throughout the day.  He is unclear regarding fluid intake but does state he prefers sweet tea.  He is a Psychologist, sport and exercise.     NCV/EMG:  Impression: This NCV/EMG Robert [name] shows the following: 1.  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  Left L5/S1 chronic radiculopathies.   Lumbar MRI: IMPRESSION: This MRI of the lumbar spine without contrast shows multilevel degenerative changes as detailed above.  Most significant findings are: 1.   At L3-L4, there are degenerative changes causing mild to moderate left lateral recess stenosis but no nerve root compression or spinal stenosis. 2.   At  L4-L5, there are degenerative changes causing moderate right foraminal narrowing, mild left foraminal narrowing and moderate bilateral lateral recess stenosis.  The degenerative changes encroach upon the L5 nerve roots but do not cause definite nerve root compression. 3.   At L5-S1, there are degenerative changes causing moderate left foraminal and moderate left lateral recess stenosis encroaching upon the left L5 and S1 nerve roots without causing definite nerve root compression.   HISTORY: (copied from Dr Garth Bigness note on 09/24/2019)  I had the pleasure of seeing your patient, Robert Robert [name] Robert [name], at [facility] for neurologic consultation regarding his foot numbness and pain.  He is a 74 year old man who has had progressive dysesthesias in his feet.   He has a tight sensation and mild pain that seems to go up the leg some at times.   Symptoms started in his left foot.   Currently symptoms are bilateral though left is worse.    He feels fatigued in his legs but not weak.  He sometimes gets leg cramps.    He notes no change in gait for the most part but occasionally feels off balanced.   He is needing to hold the wall when he closes his eyes in the shower.    Pain increases when he stands up a while.   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       He tried gabapentin and then  was switched to Lyrica.   He does not feel he had any benefit.  Prednisone did not help.  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 Ellene Route around 2015.   He had left Morton neuroma surgery about 3 years ago.   He has scar tissue in his left sole..     he has had stomach/small intestine surgery.     He has urinary hesitancy but has a known large prostate.   Flomax and Cialis had not helped.    Studies reviewed: MRI ankle 09/19/2017: IMPRESSION: 1. Normal peroneal tendons. 2. No acute osseous abnormality. 3. Small posterior subtalar joint effusion.  However  another review of the MRI found Achilles and peroneal brevis tendinosis and plantar fasciitis  MRI cervical spine 12/11/2017.  I personally reviewed these images and concur with the official impression below.  There is some foraminal narrowing to the right at the involved levels but no definite nerve root compression.  There is 1 small T2 hyperintense focus within the pons. IMPRESSION: No abnormality seen to explain the presenting symptoms. Minimal, non-compressive disc bulges at C3-4, C4-5 and C5-6. No evidence of facet arthropathy.  He reports he  had Lumbar MRi at [facility] but not in PACS.  But he states being told nothing that would explain his symptoms.    Rheum consult 09/09/2019 reported MRI's showed chronic changes but no explanation for pain  (not sure which MRI).    ACE, RF, CCP, ANA, Lyme, CRP, ESR, HLA, CK, uric acid and ANCA were normal or negative   REVIEW OF SYSTEMS: Out of a complete 14 system review of symptoms, the patient complains only of the following symptoms, back pain, radiculopathy, neuropathy, swelling of feet, adequate sleep, frequent urination and all other reviewed systems are negative.  ALLERGIES: Allergies  Allergen Reactions  . Ibuprofen Other (See Comments)    stomach cramps   . Sulfonamide Derivatives Itching    HOME MEDICATIONS: Outpatient Medications Prior to Visit  Medication Sig Dispense Refill  . amLODipine (NORVASC) 5 MG tablet TAKE 1 TABLET BY MOUTH EVERY DAY 90 tablet 3  . aspirin EC 81 MG tablet Take 81 mg by mouth daily.    . fexofenadine (ALLEGRA) 180 MG tablet Take 180 mg by mouth daily.     . fluticasone (FLONASE) 50 MCG/ACT nasal spray Place 2 sprays into both nostrils daily. (Patient taking differently: Place 2 sprays into both nostrils as needed. ) 16 g 6  . hydrochlorothiazide (HYDRODIURIL) 25 MG tablet TAKE 1 TABLET BY MOUTH EVERY DAY 90 tablet 3  . losartan (COZAAR) 100 MG tablet TAKE 1 TABLET BY MOUTH EVERY DAY 30 tablet 30  .  omeprazole (PRILOSEC) 20 MG capsule Take 20 mg by mouth daily.    Marland Kitchen lamoTRIgine (LAMICTAL) 100 MG tablet Take 0.5 pill qd x 5 days, then 0.5 pills bid x 5 days, then 0.5 pills in AM and 1 pill in PM x 5 days, then 1 pill po bid.  Stop if rash develops. 60 tablet 5  . Oxcarbazepine (TRILEPTAL) 300 MG tablet Take one pill po qAm and two pills po qHS (Patient not taking: Reported on 01/27/2020) 90 tablet 5   No facility-administered medications prior to visit.    PAST MEDICAL HISTORY: Past Medical History:  Diagnosis Date  . Adenomatous polyp   . Allergy   . Atrial tachycardia ([place])   . Diverticulosis   . Dupuytren's disease    palms and soles  .  GERD (gastroesophageal reflux disease)   . Hypertension     PAST SURGICAL HISTORY: Past Surgical History:  Procedure Laterality Date  . BACK SURGERY     12/2013  . CARDIAC CATHETERIZATION  1990's  . COLECTOMY  2010   for diverticulitis  . COLONOSCOPY  04/03/2013  . LASIK  2002   Bil  . POLYPECTOMY      FAMILY HISTORY: Family History  Problem Relation Age of Onset  . Hypertension Mother   . Macular degeneration Mother   . Kidney disease Mother   . Dementia Mother   . Hypertension Father   . Melanoma Sister   . Stroke Other        uncle  . Prostate cancer Other        uncle  . Colon cancer Other        grandmother/uncle  . Kidney disease Other        grandmother  . Liver cancer Other        uncle (mets)  . Heart disease Other        aunt/uncle  . Lung cancer Other        uncle  . Stomach cancer Cousin   . Heart disease Cousin   . Colon cancer Paternal Grandmother   . Colon cancer Paternal Uncle   . Prostate cancer Paternal Uncle   . Arthritis Brother   . Hypertension Brother   . Esophageal cancer Neg Hx   . Rectal cancer Neg Hx     SOCIAL HISTORY: Social History   Socioeconomic History  . Marital status: Married    Spouse name: Robert Robert [name] Robert [name]  . Number of children: 3  . Years of education: Not on file  .  Highest education level: Not on file  Occupational History  . Occupation: Retired  Tobacco Use  . Smoking status: Former Smoker    Types: Cigars    Quit date: 2015    Years since quitting: 6.4  . Smokeless tobacco: Never Used  . Tobacco comment: Pt states smokes occasional cigar.  This is typically < 1 time per month  Substance and Sexual Activity  . Alcohol use: No    Alcohol/week: 0.0 standard drinks  . Drug use: No  . Sexual activity: Not on file  Other Topics Concern  . Not on file  Social History Narrative   Pt lives in [place].     Retired from [employer] and [employer].     Goes on mission trips to [place], gets regular exercise.   Caffeine use: very rare, Diet coke   Right handed    Social Determinants of Health   Financial Resource Strain: Low Risk   . Difficulty of Paying Living Expenses: Not hard at all  Food Insecurity:   . Worried About Charity fundraiser in the Last Year:   . Arboriculturist in the Last Year:   Transportation Needs:   . Film/video editor (Medical):   Marland Kitchen Lack of Transportation (Non-Medical):   Physical Activity:   . Days of Exercise per Week:   . Minutes of Exercise per Session:   Stress:   . Feeling of Stress :   Social Connections:   . Frequency of Communication with Friends and Family:   . Frequency of Social Gatherings with Friends and Family:   . Attends Religious Services:   . Active Member of Clubs or Organizations:   . Attends Archivist Meetings:   Marland Kitchen Marital Status:  Intimate Partner Violence:   . Fear of Current or Ex-Partner:   . Emotionally Abused:   Marland Kitchen Physically Abused:   . Sexually Abused:       PHYSICAL EXAM  Vitals:   01/28/20 0854  BP: 127/72  Pulse: (!) 59  Weight: 216 lb (98 kg)  Height: 5' 9.5" (1.765 m)   Body mass index is 31.44 kg/m.  Generalized: Well developed, in no acute distress  Cardiology: normal rate and rhythm, no murmur noted Respiratory: clear to auscultation bilaterally    Neurological examination  Mentation: Alert oriented to time, place, history taking. Follows all commands speech and language fluent Cranial nerve II-XII: Pupils were equal round reactive to light. Extraocular movements were full, visual field were full on confrontational test. Facial sensation and strength were normal. Uvula tongue midline. Head turning and shoulder shrug  were normal and symmetric. Motor: The motor testing reveals 5 over 5 strength of all 4 extremities. Good symmetric motor tone is noted throughout.  Sensory: Sensory testing is intact to soft touch on all 4 extremities with the exception of decreased sensation of feet bilaterally from ankle down.  No evidence of extinction is noted.  Coordination: Cerebellar testing reveals good finger-nose-finger and heel-to-shin bilaterally.  Gait and station: Gait is normal.   DIAGNOSTIC DATA (LABS, IMAGING, TESTING) - I reviewed patient records, labs, notes, testing and imaging myself where available.  No flowsheet data found.   Lab Results  Component Value Date   WBC 8.0 10/29/2017   HGB 16.0 10/29/2017   HCT 47.3 10/29/2017   MCV 86.6 10/29/2017   PLT 269 10/29/2017      Component Value Date/Time   NA 138 03/17/2019 1440   NA 142 12/12/2017 1511   K 3.7 03/17/2019 1440   CL 103 03/17/2019 1440   CO2 28 03/17/2019 1440   GLUCOSE 132 (H) 03/17/2019 1440   BUN 17 03/17/2019 1440   BUN 24 12/12/2017 1511   CREATININE 1.05 03/17/2019 1440   CALCIUM 9.4 03/17/2019 1440   PROT 7.1 09/24/2019 1034   ALBUMIN 4.3 11/13/2016 0854   AST 15 10/29/2017 1509   ALT 17 10/29/2017 1509   ALKPHOS 71 11/13/2016 0854   BILITOT 0.4 10/29/2017 1509   GFRNONAA 71 03/17/2019 1440   GFRAA 82 03/17/2019 1440   Lab Results  Component Value Date   CHOL 167 11/19/2017   HDL 52 11/19/2017   LDLCALC 100 (H) 11/19/2017   TRIG 63 11/19/2017   CHOLHDL 3.2 11/19/2017   Lab Results  Component Value Date   HGBA1C CANCELED 03/17/2019   Lab  Results  Component Value Date   VITAMINB12 328 03/17/2019   Lab Results  Component Value Date   TSH 0.68 03/17/2019       ASSESSMENT AND PLAN 74 y.o. year old male  has a past medical history of Adenomatous polyp, Allergy, Atrial tachycardia ([place]), Diverticulosis, Dupuytren's disease, GERD (gastroesophageal reflux disease), and Hypertension. here with     ICD-10-CM   1. Polyneuropathy  G62.9   2. Lumbar radiculopathy  M54.16   3. Muscle cramps  R25.2     Robert Robert [name] Robert [name] returns today with continued concerns of low back pain, radiculopathy, muscle cramping and tightness and swelling in his feet and neuropathic pain of lower extremities and feet.  He has been on multiple medications including gabapentin, Lyrica, ox carbamazepine, lamotrigine with no significant benefit.  LSI seem to be beneficial for several days, however, symptoms returned after driving to and from [place].  We  have discussed multiple factors that could be contributing.  I have reviewed MRI and nerve conduction/EMG results with him in the room.  We have also discussed other factors that could be contributing including hydration status, anxiety and possible sleep apnea.  He does not think that he has ever taken amitriptyline.  This medication may be beneficial for help with sleep as well as neuropathic pain.  May also be helpful for any component of anxiety.  I have discussed possible side effects of this medication with Robert Robert [name] Robert [name] and also provided additional information in the AVS.  We will start with 25 mg daily at bedtime.  May increase if well tolerated.  He was encouraged to continue healthy lifestyle habits with well-balanced diet and regular exercise.  I would like for him to focus on drinking at least 50 to 60 ounces of water every day.  He will continue close follow-up with primary care as directed.  May consider additional LSI treatments in the future if needed. I have asked him to read about sleep apnea. I am suspicious that this  could be contributing to his fatigue.  I will have him return 6 weeks to evaluate response to starting amitriptyline. He verbalizes understanding and agreement with this plan.    No orders of the defined types were placed in this encounter.    Meds ordered this encounter  Medications  . amitriptyline (ELAVIL) 25 MG tablet    Sig: Take 1 tablet (25 mg total) by mouth at bedtime.    Dispense:  30 tablet    Refill:  3    Order Specific Question:   Supervising Provider    Answer:   Melvenia Beam [id number]      I spent 30 minutes with the patient. 50% of this time was spent counseling and educating patient on plan of care and medications.    Debbora Presto, FNP-C 01/28/2020, 11:51 AM [facility] [address] [place], [place] [address] [phone]

## 2020-01-28 NOTE — Patient Instructions (Signed)
Let's try amitriptyline 25mg  at bedtime. Please let me know if you have any worsening/new concerns.   Talk with your wife regarding concerns for sleep apnea.   Please stay well hydrated. Focus on getting a minimum of 50-60 of water every day. Stay active.    Follow up in 6 weeks   Amitriptyline tablets What is this medicine? AMITRIPTYLINE (a mee TRIP ti leen) is used to treat depression. This medicine may be used for other purposes; ask your health care provider or pharmacist if you have questions. COMMON BRAND NAME(S): Elavil, Vanatrip What should I tell my health care provider before I take this medicine? They need to know if you have any of these conditions:  an alcohol problem  asthma, difficulty breathing  bipolar disorder or schizophrenia  difficulty passing urine, prostate trouble  glaucoma  heart disease or previous heart attack  liver disease  over active thyroid  seizures  thoughts or plans of suicide, a previous suicide attempt, or family history of suicide attempt  an unusual or allergic reaction to amitriptyline, other medicines, foods, dyes, or preservatives  pregnant or trying to get pregnant  breast-feeding How should I use this medicine? Take this medicine by mouth with a drink of water. Follow the directions on the prescription label. You can take the tablets with or without food. Take your medicine at regular intervals. Do not take it more often than directed. Do not stop taking this medicine suddenly except upon the advice of your doctor. Stopping this medicine too quickly may cause serious side effects or your condition may worsen. A special MedGuide will be given to you by the pharmacist with each prescription and refill. Be sure to read this information carefully each time. Talk to your pediatrician regarding the use of this medicine in children. Special care may be needed. Overdosage: If you think you have taken too much of this medicine  contact a poison control center or emergency room at once. NOTE: This medicine is only for you. Do not share this medicine with others. What if I miss a dose? If you miss a dose, take it as soon as you can. If it is almost time for your next dose, take only that dose. Do not take double or extra doses. What may interact with this medicine? Do not take this medicine with any of the following medications:  arsenic trioxide  certain medicines used to regulate abnormal heartbeat or to treat other heart conditions  cisapride  droperidol  halofantrine  linezolid  MAOIs like Carbex, Eldepryl, Marplan, Nardil, and Parnate  methylene blue  other medicines for mental depression  phenothiazines like perphenazine, thioridazine and chlorpromazine  pimozide  probucol  procarbazine  sparfloxacin  St. John's Wort This medicine may also interact with the following medications:  atropine and related drugs like hyoscyamine, scopolamine, tolterodine and others  barbiturate medicines for inducing sleep or treating seizures, like phenobarbital  cimetidine  disulfiram  ethchlorvynol  thyroid hormones such as levothyroxine  ziprasidone This list may not describe all possible interactions. Give your health care provider a list of all the medicines, herbs, non-prescription drugs, or dietary supplements you use. Also tell them if you smoke, drink alcohol, or use illegal drugs. Some items may interact with your medicine. What should I watch for while using this medicine? Tell your doctor if your symptoms do not get better or if they get worse. Visit your doctor or health care professional for regular checks on your progress. Because it may take  several weeks to see the full effects of this medicine, it is important to continue your treatment as prescribed by your doctor. Patients and their families should watch out for new or worsening thoughts of suicide or depression. Also watch out for  sudden changes in feelings such as feeling anxious, agitated, panicky, irritable, hostile, aggressive, impulsive, severely restless, overly excited and hyperactive, or not being able to sleep. If this happens, especially at the beginning of treatment or after a change in dose, call your health care professional. Dennis Bast may get drowsy or dizzy. Do not drive, use machinery, or do anything that needs mental alertness until you know how this medicine affects you. Do not stand or sit up quickly, especially if you are an older patient. This reduces the risk of dizzy or fainting spells. Alcohol may interfere with the effect of this medicine. Avoid alcoholic drinks. Do not treat yourself for coughs, colds, or allergies without asking your doctor or health care professional for advice. Some ingredients can increase possible side effects. Your mouth may get dry. Chewing sugarless gum or sucking hard candy, and drinking plenty of water will help. Contact your doctor if the problem does not go away or is severe. This medicine may cause dry eyes and blurred vision. If you wear contact lenses you may feel some discomfort. Lubricating drops may help. See your eye doctor if the problem does not go away or is severe. This medicine can cause constipation. Try to have a bowel movement at least every 2 to 3 days. If you do not have a bowel movement for 3 days, call your doctor or health care professional. This medicine can make you more sensitive to the sun. Keep out of the sun. If you cannot avoid being in the sun, wear protective clothing and use sunscreen. Do not use sun lamps or tanning beds/booths. What side effects may I notice from receiving this medicine? Side effects that you should report to your doctor or health care professional as soon as possible:  allergic reactions like skin rash, itching or hives, swelling of the face, lips, or tongue  anxious  breathing problems  changes in vision  confusion  elevated  mood, decreased need for sleep, racing thoughts, impulsive behavior  eye pain  fast, irregular heartbeat  feeling faint or lightheaded, falls  feeling agitated, angry, or irritable  fever with increased sweating  hallucination, loss of contact with reality  seizures  stiff muscles  suicidal thoughts or other mood changes  tingling, pain, or numbness in the feet or hands  trouble passing urine or change in the amount of urine  trouble sleeping  unusually weak or tired  vomiting  yellowing of the eyes or skin Side effects that usually do not require medical attention (report to your doctor or health care professional if they continue or are bothersome):  change in sex drive or performance  change in appetite or weight  constipation  dizziness  dry mouth  nausea  tired  tremors  upset stomach This list may not describe all possible side effects. Call your doctor for medical advice about side effects. You may report side effects to FDA at 1-800-FDA-1088. Where should I keep my medicine? Keep out of the reach of children. Store at room temperature between 20 and 25 degrees C (68 and 77 degrees F). Throw away any unused medicine after the expiration date. NOTE: This sheet is a summary. It may not cover all possible information. If you have questions about this  medicine, talk to your doctor, pharmacist, or health care provider.  2020 Elsevier/Gold Standard (2018-08-13 13:04:32)   Sleep Apnea Sleep apnea affects breathing during sleep. It causes breathing to stop for a short time or to become shallow. It can also increase the risk of:  Heart attack.  Stroke.  Being very overweight (obese).  Diabetes.  Heart failure.  Irregular heartbeat. The goal of treatment is to help you breathe normally again. What are the causes? There are three kinds of sleep apnea:  Obstructive sleep apnea. This is caused by a blocked or collapsed airway.  Central sleep  apnea. This happens when the brain does not send the right signals to the muscles that control breathing.  Mixed sleep apnea. This is a combination of obstructive and central sleep apnea. The most common cause of this condition is a collapsed or blocked airway. This can happen if:  Your throat muscles are too relaxed.  Your tongue and tonsils are too large.  You are overweight.  Your airway is too small. What increases the risk?  Being overweight.  Smoking.  Having a small airway.  Being older.  Being male.  Drinking alcohol.  Taking medicines to calm yourself (sedatives or tranquilizers).  Having family members with the condition. What are the signs or symptoms?  Trouble staying asleep.  Being sleepy or tired during the day.  Getting angry a lot.  Loud snoring.  Headaches in the morning.  Not being able to focus your mind (concentrate).  Forgetting things.  Less interest in sex.  Mood swings.  Personality changes.  Feelings of sadness (depression).  Waking up a lot during the night to pee (urinate).  Dry mouth.  Sore throat. How is this diagnosed?  Your medical history.  A physical exam.  A test that is done when you are sleeping (sleep study). The test is most often done in a sleep lab but may also be done at home. How is this treated?   Sleeping on your side.  Using a medicine to get rid of mucus in your nose (decongestant).  Avoiding the use of alcohol, medicines to help you relax, or certain pain medicines (narcotics).  Losing weight, if needed.  Changing your diet.  Not smoking.  Using a machine to open your airway while you sleep, such as: ? An oral appliance. This is a mouthpiece that shifts your lower jaw forward. ? A CPAP device. This device blows air through a mask when you breathe out (exhale). ? An EPAP device. This has valves that you put in each nostril. ? A BPAP device. This device blows air through a mask when you  breathe in (inhale) and breathe out.  Having surgery if other treatments do not work. It is important to get treatment for sleep apnea. Without treatment, it can lead to:  High blood pressure.  Coronary artery disease.  In men, not being able to have an erection (impotence).  Reduced thinking ability. Follow these instructions at home: Lifestyle  Make changes that your doctor recommends.  Eat a healthy diet.  Lose weight if needed.  Avoid alcohol, medicines to help you relax, and some pain medicines.  Do not use any products that contain nicotine or tobacco, such as cigarettes, e-cigarettes, and chewing tobacco. If you need help quitting, ask your doctor. General instructions  Take over-the-counter and prescription medicines only as told by your doctor.  If you were given a machine to use while you sleep, use it only as told by  your doctor.  If you are having surgery, make sure to tell your doctor you have sleep apnea. You may need to bring your device with you.  Keep all follow-up visits as told by your doctor. This is important. Contact a doctor if:  The machine that you were given to use during sleep bothers you or does not seem to be working.  You do not get better.  You get worse. Get help right away if:  Your chest hurts.  You have trouble breathing in enough air.  You have an uncomfortable feeling in your back, arms, or stomach.  You have trouble talking.  One side of your body feels weak.  A part of your face is hanging down. These symptoms may be an emergency. Do not wait to see if the symptoms will go away. Get medical help right away. Call your local emergency services (911 in the [place] Do not drive yourself to the hospital. Summary  This condition affects breathing during sleep.  The most common cause is a collapsed or blocked airway.  The goal of treatment is to help you breathe normally while you sleep. This information is not intended to  replace advice given to you by your health care provider. Make sure you discuss any questions you have with your health care provider. Document Revised: 06/07/2018 Document Reviewed: 04/16/2018 Elsevier Patient Education  2020 [place].   Neuropathic Pain Neuropathic pain is pain caused by damage to the nerves that are responsible for certain sensations in your body (sensory nerves). The pain can be caused by:  Damage to the sensory nerves that send signals to your spinal cord and brain (peripheral nervous system).  Damage to the sensory nerves in your brain or spinal cord (central nervous system). Neuropathic pain can make you more sensitive to pain. Even a minor sensation can feel very painful. This is usually a long-term condition that can be difficult to treat. The type of pain differs from person to person. It may:  Start suddenly (acute), or it may develop slowly and last for a long time (chronic).  Come and go as damaged nerves heal, or it may stay at the same level for years.  Cause emotional distress, loss of sleep, and a lower quality of life. What are the causes? The most common cause of this condition is diabetes. Many other diseases and conditions can also cause neuropathic pain. Causes of neuropathic pain can be classified as:  Toxic. This is caused by medicines and chemicals. The most common cause of toxic neuropathic pain is damage from cancer treatments (chemotherapy).  Metabolic. This can be caused by: ? Diabetes. This is the most common disease that damages the nerves. ? Lack of vitamin B from long-term alcohol abuse.  Traumatic. Any injury that cuts, crushes, or stretches a nerve can cause damage and pain. A common example is feeling pain after losing an arm or leg (phantom limb pain).  Compression-related. If a sensory nerve gets trapped or compressed for a long period of time, the blood supply to the nerve can be cut off.  Vascular. Many blood vessel diseases  can cause neuropathic pain by decreasing blood supply and oxygen to nerves.  Autoimmune. This type of pain results from diseases in which the body's defense system (immune system) mistakenly attacks sensory nerves. Examples of autoimmune diseases that can cause neuropathic pain include lupus and multiple sclerosis.  Infectious. Many types of viral infections can damage sensory nerves and cause pain. Shingles infection is  a common cause of this type of pain.  Inherited. Neuropathic pain can be a symptom of many diseases that are passed down through families (genetic). What increases the risk? You are more likely to develop this condition if:  You have diabetes.  You smoke.  You drink too much alcohol.  You are taking certain medicines, including medicines that kill cancer cells (chemotherapy) or that treat immune system disorders. What are the signs or symptoms? The main symptom is pain. Neuropathic pain is often described as:  Burning.  Shock-like.  Stinging.  Hot or cold.  Itching. How is this diagnosed? No single test can diagnose neuropathic pain. It is diagnosed based on:  Physical exam and your symptoms. Your health care provider will ask you about your pain. You may be asked to use a pain scale to describe how bad your pain is.  Tests. These may be done to see if you have a high sensitivity to pain and to help find the cause and location of any sensory nerve damage. They include: ? Nerve conduction studies to test how well nerve signals travel through your sensory nerves (electrodiagnostic testing). ? Stimulating your sensory nerves through electrodes on your skin and measuring the response in your spinal cord and brain (somatosensory evoked potential).  Imaging studies, such as: ? X-rays. ? CT scan. ? MRI. How is this treated? Treatment for neuropathic pain may change over time. You may need to try different treatment options or a combination of treatments. Some  options include:  Treating the underlying cause of the neuropathy, such as diabetes, kidney disease, or vitamin deficiencies.  Stopping medicines that can cause neuropathy, such as chemotherapy.  Medicine to relieve pain. Medicines may include: ? Prescription or over-the-counter pain medicine. ? Anti-seizure medicine. ? Antidepressant medicines. ? Pain-relieving patches that are applied to painful areas of skin. ? A medicine to numb the area (local anesthetic), which can be injected as a nerve block.  Transcutaneous nerve stimulation. This uses electrical currents to block painful nerve signals. The treatment is painless.  Alternative treatments, such as: ? Acupuncture. ? Meditation. ? Massage. ? Physical therapy. ? Pain management programs. ? Counseling. Follow these instructions at home: Medicines   Take over-the-counter and prescription medicines only as told by your health care provider.  Do not drive or use heavy machinery while taking prescription pain medicine.  If you are taking prescription pain medicine, take actions to prevent or treat constipation. Your health care provider may recommend that you: ? Drink enough fluid to keep your urine pale yellow. ? Eat foods that are high in fiber, such as fresh fruits and vegetables, whole grains, and beans. ? Limit foods that are high in fat and processed sugars, such as fried or sweet foods. ? Take an over-the-counter or prescription medicine for constipation. Lifestyle   Have a good support system at home.  Consider joining a chronic pain support group.  Do not use any products that contain nicotine or tobacco, such as cigarettes and e-cigarettes. If you need help quitting, ask your health care provider.  Do not drink alcohol. General instructions  Learn as much as you can about your condition.  Work closely with all your health care providers to find the treatment plan that works best for you.  Ask your health  care provider what activities are safe for you.  Keep all follow-up visits as told by your health care provider. This is important. Contact a health care provider if:  Your pain treatments  are not working.  You are having side effects from your medicines.  You are struggling with tiredness (fatigue), mood changes, depression, or anxiety. Summary  Neuropathic pain is pain caused by damage to the nerves that are responsible for certain sensations in your body (sensory nerves).  Neuropathic pain may come and go as damaged nerves heal, or it may stay at the same level for years.  Neuropathic pain is usually a long-term condition that can be difficult to treat. Consider joining a chronic pain support group. This information is not intended to replace advice given to you by your health care provider. Make sure you discuss any questions you have with your health care provider. Document Revised: 12/12/2018 Document Reviewed: 09/07/2017 Elsevier Patient Education  [place].   Radicular Pain Radicular pain is a type of pain that spreads from your back or neck along a spinal nerve. Spinal nerves are nerves that leave the spinal cord and go to the muscles. Radicular pain is sometimes called radiculopathy, radiculitis, or a pinched nerve. When you have this type of pain, you may also have weakness, numbness, or tingling in the area of your body that is supplied by the nerve. The pain may feel sharp and burning. Depending on which spinal nerve is affected, the pain may occur in the:  Neck area (cervical radicular pain). You may also feel pain, numbness, weakness, or tingling in the arms.  Mid-spine area (thoracic radicular pain). You would feel this pain in the back and chest. This type is rare.  Lower back area (lumbar radicular pain). You would feel this pain as low back pain. You may feel pain, numbness, weakness, or tingling in the buttocks or legs. Sciatica is a type of lumbar radicular  pain that shoots down the back of the leg. Radicular pain occurs when one of the spinal nerves becomes irritated or squeezed (compressed). It is often caused by something pushing on a spinal nerve, such as one of the bones of the spine (vertebrae) or one of the round cushions between vertebrae (intervertebral disks). This can result from:  An injury.  Wear and tear or aging of a disk.  The growth of a bone spur that pushes on the nerve. Radicular pain often goes away when you follow instructions from your health care provider for relieving pain at home. Follow these instructions at home: Managing pain      If directed, put ice on the affected area: ? Put ice in a plastic bag. ? Place a towel between your skin and the bag. ? Leave the ice on for 20 minutes, 2-3 times a day.  If directed, apply heat to the affected area as often as told by your health care provider. Use the heat source that your health care provider recommends, such as a moist heat pack or a heating pad. ? Place a towel between your skin and the heat source. ? Leave the heat on for 20-30 minutes. ? Remove the heat if your skin turns bright red. This is especially important if you are unable to feel pain, heat, or cold. You may have a greater risk of getting burned. Activity   Do not sit or rest in bed for long periods of time.  Try to stay as active as possible. Ask your health care provider what type of exercise or activity is best for you.  Avoid activities that make your pain worse, such as bending and lifting.  Do not lift anything that is heavier than  10 lb (4.5 kg), or the limit that you are told, until your health care provider says that it is safe.  Practice using proper technique when lifting items. Proper lifting technique involves bending your knees and rising up.  Do strength and range-of-motion exercises only as told by your health care provider or physical therapist. General instructions  Take  over-the-counter and prescription medicines only as told by your health care provider.  Pay attention to any changes in your symptoms.  Keep all follow-up visits as told by your health care provider. This is important. ? Your health care provider may send you to a physical therapist to help with this pain. Contact a health care provider if:  Your pain and other symptoms get worse.  Your pain medicine is not helping.  Your pain has not improved after a few weeks of home care.  You have a fever. Get help right away if:  You have severe pain, weakness, or numbness.  You have difficulty with bladder or bowel control. Summary  Radicular pain is a type of pain that spreads from your back or neck along a spinal nerve.  When you have radicular pain, you may also have weakness, numbness, or tingling in the area of your body that is supplied by the nerve.  The pain may feel sharp or burning.  Radicular pain may be treated with ice, heat, medicines, or physical therapy. This information is not intended to replace advice given to you by your health care provider. Make sure you discuss any questions you have with your health care provider. Document Revised: 03/05/2018 Document Reviewed: 03/05/2018 Elsevier Patient Education  [place].

## 2020-01-28 NOTE — Telephone Encounter (Signed)
No appt opening at this time.

## 2020-01-28 NOTE — Telephone Encounter (Signed)
Noted. Placed on cancellation list.

## 2020-02-04 ENCOUNTER — Ambulatory Visit: Payer: Medicare Other | Admitting: Neurology

## 2020-03-01 ENCOUNTER — Telehealth: Payer: Self-pay | Admitting: *Deleted

## 2020-03-01 NOTE — Telephone Encounter (Signed)
LMVM for pt that have a availability at 1500 today.  For AL/NP 6 wks f/u.

## 2020-03-11 ENCOUNTER — Ambulatory Visit (INDEPENDENT_AMBULATORY_CARE_PROVIDER_SITE_OTHER): Payer: Medicare Other | Admitting: Family Medicine

## 2020-03-11 ENCOUNTER — Encounter: Payer: Self-pay | Admitting: Family Medicine

## 2020-03-11 ENCOUNTER — Other Ambulatory Visit: Payer: Self-pay

## 2020-03-11 VITALS — BP 132/75 | HR 70 | Ht 69.0 in | Wt 219.0 lb

## 2020-03-11 DIAGNOSIS — G629 Polyneuropathy, unspecified: Secondary | ICD-10-CM | POA: Diagnosis not present

## 2020-03-11 DIAGNOSIS — M79672 Pain in left foot: Secondary | ICD-10-CM | POA: Diagnosis not present

## 2020-03-11 DIAGNOSIS — G8929 Other chronic pain: Secondary | ICD-10-CM

## 2020-03-11 DIAGNOSIS — R208 Other disturbances of skin sensation: Secondary | ICD-10-CM

## 2020-03-11 DIAGNOSIS — M545 Low back pain: Secondary | ICD-10-CM

## 2020-03-11 DIAGNOSIS — M5416 Radiculopathy, lumbar region: Secondary | ICD-10-CM | POA: Diagnosis not present

## 2020-03-11 MED ORDER — AMITRIPTYLINE HCL 10 MG PO TABS: 10.0000 mg | ORAL_TABLET | Freq: Every day | ORAL | 3 refills | Status: DC

## 2020-03-11 MED ORDER — DULOXETINE HCL 30 MG PO CPEP: 30.0000 mg | ORAL_CAPSULE | Freq: Every day | ORAL | 3 refills | Status: DC

## 2020-03-11 NOTE — Patient Instructions (Signed)
We will decrease amitriptyline to 10mg  daily at bedtime. We will also add duloxetine 30mg  daily. I anticipate needing to increase this dose if you tolerate it well. Please call me if you are doing ok in 2-3 weeks. We will plan to increase dose to 60mg .   I would like for you to follow up in 3-4 months with Dr Felecia Shelling.    Duloxetine delayed-release capsules What is this medicine? DULOXETINE (doo LOX e teen) is used to treat depression, anxiety, and different types of chronic pain. This medicine may be used for other purposes; ask your health care provider or pharmacist if you have questions. COMMON BRAND NAME(S): Cymbalta, Creig Hines, Irenka What should I tell my health care provider before I take this medicine? They need to know if you have any of these conditions:  bipolar disorder  glaucoma  high blood pressure  kidney disease  liver disease  seizures  suicidal thoughts, plans or attempt; a previous suicide attempt by you or a family member  take medicines that treat or prevent blood clots  taken medicines called MAOIs like Carbex, Eldepryl, Marplan, Nardil, and Parnate within 14 days  trouble passing urine  an unusual reaction to duloxetine, other medicines, foods, dyes, or preservatives  pregnant or trying to get pregnant  breast-feeding How should I use this medicine? Take this medicine by mouth with a glass of water. Follow the directions on the prescription label. Do not crush, cut or chew some capsules of this medicine. Some capsules may be opened and sprinkled on applesauce. Check with your doctor or pharmacist if you are not sure. You can take this medicine with or without food. Take your medicine at regular intervals. Do not take your medicine more often than directed. Do not stop taking this medicine suddenly except upon the advice of your doctor. Stopping this medicine too quickly may cause serious side effects or your condition may worsen. A special MedGuide will  be given to you by the pharmacist with each prescription and refill. Be sure to read this information carefully each time. Talk to your pediatrician regarding the use of this medicine in children. While this drug may be prescribed for children as young as 28 years of age for selected conditions, precautions do apply. Overdosage: If you think you have taken too much of this medicine contact a poison control center or emergency room at once. NOTE: This medicine is only for you. Do not share this medicine with others. What if I miss a dose? If you miss a dose, take it as soon as you can. If it is almost time for your next dose, take only that dose. Do not take double or extra doses. What may interact with this medicine? Do not take this medicine with any of the following medications:  desvenlafaxine  levomilnacipran  linezolid  MAOIs like Carbex, Eldepryl, Marplan, Nardil, and Parnate  methylene blue (injected into a vein)  milnacipran  thioridazine  venlafaxine This medicine may also interact with the following medications:  alcohol  amphetamines  aspirin and aspirin-like medicines  certain antibiotics like ciprofloxacin and enoxacin  certain medicines for blood pressure, heart disease, irregular heart beat  certain medicines for depression, anxiety, or psychotic disturbances  certain medicines for migraine headache like almotriptan, eletriptan, frovatriptan, naratriptan, rizatriptan, sumatriptan, zolmitriptan  certain medicines that treat or prevent blood clots like warfarin, enoxaparin, and dalteparin  cimetidine  fentanyl  lithium  NSAIDS, medicines for pain and inflammation, like ibuprofen or naproxen  phentermine  procarbazine  rasagiline  sibutramine  St. John's wort  theophylline  tramadol  tryptophan This list may not describe all possible interactions. Give your health care provider a list of all the medicines, herbs, non-prescription drugs, or  dietary supplements you use. Also tell them if you smoke, drink alcohol, or use illegal drugs. Some items may interact with your medicine. What should I watch for while using this medicine? Tell your doctor if your symptoms do not get better or if they get worse. Visit your doctor or healthcare provider for regular checks on your progress. Because it may take several weeks to see the full effects of this medicine, it is important to continue your treatment as prescribed by your doctor. This medicine may cause serious skin reactions. They can happen weeks to months after starting the medicine. Contact your healthcare provider right away if you notice fevers or flu-like symptoms with a rash. The rash may be red or purple and then turn into blisters or peeling of the skin. Or, you might notice a red rash with swelling of the face, lips, or lymph nodes in your neck or under your arms. Patients and their families should watch out for new or worsening thoughts of suicide or depression. Also watch out for sudden changes in feelings such as feeling anxious, agitated, panicky, irritable, hostile, aggressive, impulsive, severely restless, overly excited and hyperactive, or not being able to sleep. If this happens, especially at the beginning of treatment or after a change in dose, call your healthcare provider. You may get drowsy or dizzy. Do not drive, use machinery, or do anything that needs mental alertness until you know how this medicine affects you. Do not stand or sit up quickly, especially if you are an older patient. This reduces the risk of dizzy or fainting spells. Alcohol may interfere with the effect of this medicine. Avoid alcoholic drinks. This medicine can cause an increase in blood pressure. This medicine can also cause a sudden drop in your blood pressure, which may make you feel faint and increase the chance of a fall. These effects are most common when you first start the medicine or when the dose is  increased, or during use of other medicines that can cause a sudden drop in blood pressure. Check with your doctor for instructions on monitoring your blood pressure while taking this medicine. Your mouth may get dry. Chewing sugarless gum or sucking hard candy, and drinking plenty of water, may help. Contact your doctor if the problem does not go away or is severe. What side effects may I notice from receiving this medicine? Side effects that you should report to your doctor or health care professional as soon as possible:  allergic reactions like skin rash, itching or hives, swelling of the face, lips, or tongue  anxious  breathing problems  confusion  changes in vision  chest pain  confusion  elevated mood, decreased need for sleep, racing thoughts, impulsive behavior  eye pain  fast, irregular heartbeat  feeling faint or lightheaded, falls  feeling agitated, angry, or irritable  hallucination, loss of contact with reality  high blood pressure  loss of balance or coordination  palpitations  redness, blistering, peeling or loosening of the skin, including inside the mouth  restlessness, pacing, inability to keep still  seizures  stiff muscles  suicidal thoughts or other mood changes  trouble passing urine or change in the amount of urine  trouble sleeping  unusual bleeding or bruising  unusually weak or tired  vomiting  yellowing of the eyes or skin Side effects that usually do not require medical attention (report to your doctor or health care professional if they continue or are bothersome):  change in sex drive or performance  change in appetite or weight  constipation  dizziness  dry mouth  headache  increased sweating  nausea  tired This list may not describe all possible side effects. Call your doctor for medical advice about side effects. You may report side effects to FDA at 1-800-FDA-1088. Where should I keep my medicine? Keep  out of the reach of children. Store at room temperature between 15 and 30 degrees C (59 to 86 degrees F). Throw away any unused medicine after the expiration date. NOTE: This sheet is a summary. It may not cover all possible information. If you have questions about this medicine, talk to your doctor, pharmacist, or health care provider.  2020 [employer]/Gold Standard (2018-11-21 13:47:50)   Amitriptyline tablets What is this medicine? AMITRIPTYLINE (a mee TRIP ti leen) is used to treat depression. This medicine may be used for other purposes; ask your health care provider or pharmacist if you have questions. COMMON BRAND NAME(S): Elavil, Vanatrip What should I tell my health care provider before I take this medicine? They need to know if you have any of these conditions:  an alcohol problem  asthma, difficulty breathing  bipolar disorder or schizophrenia  difficulty passing urine, prostate trouble  glaucoma  heart disease or previous heart attack  liver disease  over active thyroid  seizures  thoughts or plans of suicide, a previous suicide attempt, or family history of suicide attempt  an unusual or allergic reaction to amitriptyline, other medicines, foods, dyes, or preservatives  pregnant or trying to get pregnant  breast-feeding How should I use this medicine? Take this medicine by mouth with a drink of water. Follow the directions on the prescription label. You can take the tablets with or without food. Take your medicine at regular intervals. Do not take it more often than directed. Do not stop taking this medicine suddenly except upon the advice of your doctor. Stopping this medicine too quickly may cause serious side effects or your condition may worsen. A special MedGuide will be given to you by the pharmacist with each prescription and refill. Be sure to read this information carefully each time. Talk to your pediatrician regarding the use of this medicine in  children. Special care may be needed. Overdosage: If you think you have taken too much of this medicine contact a poison control center or emergency room at once. NOTE: This medicine is only for you. Do not share this medicine with others. What if I miss a dose? If you miss a dose, take it as soon as you can. If it is almost time for your next dose, take only that dose. Do not take double or extra doses. What may interact with this medicine? Do not take this medicine with any of the following medications:  arsenic trioxide  certain medicines used to regulate abnormal heartbeat or to treat other heart conditions  cisapride  droperidol  halofantrine  linezolid  MAOIs like Carbex, Eldepryl, Marplan, Nardil, and Parnate  methylene blue  other medicines for mental depression  phenothiazines like perphenazine, thioridazine and chlorpromazine  pimozide  probucol  procarbazine  sparfloxacin  St. John's Wort This medicine may also interact with the following medications:  atropine and related drugs like hyoscyamine, scopolamine, tolterodine and others  barbiturate medicines for inducing sleep  or treating seizures, like phenobarbital  cimetidine  disulfiram  ethchlorvynol  thyroid hormones such as levothyroxine  ziprasidone This list may not describe all possible interactions. Give your health care provider a list of all the medicines, herbs, non-prescription drugs, or dietary supplements you use. Also tell them if you smoke, drink alcohol, or use illegal drugs. Some items may interact with your medicine. What should I watch for while using this medicine? Tell your doctor if your symptoms do not get better or if they get worse. Visit your doctor or health care professional for regular checks on your progress. Because it may take several weeks to see the full effects of this medicine, it is important to continue your treatment as prescribed by your doctor. Patients and  their families should watch out for new or worsening thoughts of suicide or depression. Also watch out for sudden changes in feelings such as feeling anxious, agitated, panicky, irritable, hostile, aggressive, impulsive, severely restless, overly excited and hyperactive, or not being able to sleep. If this happens, especially at the beginning of treatment or after a change in dose, call your health care professional. Dennis Bast may get drowsy or dizzy. Do not drive, use machinery, or do anything that needs mental alertness until you know how this medicine affects you. Do not stand or sit up quickly, especially if you are an older patient. This reduces the risk of dizzy or fainting spells. Alcohol may interfere with the effect of this medicine. Avoid alcoholic drinks. Do not treat yourself for coughs, colds, or allergies without asking your doctor or health care professional for advice. Some ingredients can increase possible side effects. Your mouth may get dry. Chewing sugarless gum or sucking hard candy, and drinking plenty of water will help. Contact your doctor if the problem does not go away or is severe. This medicine may cause dry eyes and blurred vision. If you wear contact lenses you may feel some discomfort. Lubricating drops may help. See your eye doctor if the problem does not go away or is severe. This medicine can cause constipation. Try to have a bowel movement at least every 2 to 3 days. If you do not have a bowel movement for 3 days, call your doctor or health care professional. This medicine can make you more sensitive to the sun. Keep out of the sun. If you cannot avoid being in the sun, wear protective clothing and use sunscreen. Do not use sun lamps or tanning beds/booths. What side effects may I notice from receiving this medicine? Side effects that you should report to your doctor or health care professional as soon as possible:  allergic reactions like skin rash, itching or hives, swelling  of the face, lips, or tongue  anxious  breathing problems  changes in vision  confusion  elevated mood, decreased need for sleep, racing thoughts, impulsive behavior  eye pain  fast, irregular heartbeat  feeling faint or lightheaded, falls  feeling agitated, angry, or irritable  fever with increased sweating  hallucination, loss of contact with reality  seizures  stiff muscles  suicidal thoughts or other mood changes  tingling, pain, or numbness in the feet or hands  trouble passing urine or change in the amount of urine  trouble sleeping  unusually weak or tired  vomiting  yellowing of the eyes or skin Side effects that usually do not require medical attention (report to your doctor or health care professional if they continue or are bothersome):  change in sex drive or  performance  change in appetite or weight  constipation  dizziness  dry mouth  nausea  tired  tremors  upset stomach This list may not describe all possible side effects. Call your doctor for medical advice about side effects. You may report side effects to FDA at 1-800-FDA-1088. Where should I keep my medicine? Keep out of the reach of children. Store at room temperature between 20 and 25 degrees C (68 and 77 degrees F). Throw away any unused medicine after the expiration date. NOTE: This sheet is a summary. It may not cover all possible information. If you have questions about this medicine, talk to your doctor, pharmacist, or health care provider.  2020 [employer]/Gold Standard (2018-08-13 13:04:32)

## 2020-03-11 NOTE — Progress Notes (Signed)
PATIENT: Robert Page DOB: 09-02-1946  REASON FOR VISIT: follow up HISTORY FROM: patient  Chief Complaint  Patient presents with  . Follow-up    rm 1 here for a f/u. Pt said his sx are worse     HISTORY OF PRESENT ILLNESS: Today 03/11/20 Robert Page is a 74 y.o. male here today for follow up. He continues to have bilateral leg and feet pain. He is having significant back pain as well. He was seen by Dr Ellene Route yesterday as pain has worsened. He has significant degeneration of L4-L5. Dr Ellene Route has ordered spinal injections that are in the process of being scheduled. He has a new concern of the back of his heel starting to hurt. He describes a pulling sensation in the left heel. Has been present for the past week. Pain comes and goes. MRI showed Achilles and peroneal brevis tendonitis. He is frustrated as medications have not been beneficial. Amitriptyline is helping him sleep but he feels groggy in the mornings. He lost his mother in April. His father fell this week and ER eval showed concerns of heart disease requiring pacemake placement. He has never tried duloxetine.   HISTORY: (copied from my note on 01/28/2020)  Robert Page is a 74 y.o. male here today for follow up for polyneuropathy. He was started on oxcarbamazepine which was ineffective. He was switched to lamotrigine. After a few weeks, he noticed a rash and this medication was stopped.  He does not feel that it helped much at all.  Lumbar MRI showed degeneration. NCV/EMG showed motor polyneuropathy, left peroneal neuropathy and L5/S1 radiculopathy. He continues to have tightness of both feet. He feels that his feet are swollen but aren't. He is having terrible cramping of his lateral calves and tops of feet. He has tried Lyrica and gabapentin in the past that was not helpful. He has been seen by ortho and rheumatology. He had LSI on 5/17. He was able to travel by car to [place] and felt that symptoms were improved. Once  returning a few days ago, symptoms have worsened. He is not sleeping well. He is not sure if he snores. He is up and down all night.  He does endorse frequent bathroom trips during the night.  He is always tired throughout the day.  He is unclear regarding fluid intake but does state he prefers sweet tea.  He is a Psychologist, sport and exercise.     NCV/EMG:  Impression: This NCV/EMG study shows the following: 1. Mild length dependent sensory and motor polyneuropathy without active features 2.Mild to moderate chronic left peroneal neuropathy 3.Left L5/S1 chronic radiculopathies.   Lumbar MRI: IMPRESSION: This MRI of the lumbar spine without contrast shows multilevel degenerative changes as detailed above. Most significant findings are: 1. At L3-L4, there are degenerative changes causing mild to moderate left lateral recess stenosis but no nerve root compression or spinal stenosis. 2. At L4-L5, there are degenerative changes causing moderate right foraminal narrowing, mild left foraminal narrowing and moderate bilateral lateral recess stenosis. The degenerative changes encroach upon the L5 nerve roots but do not cause definite nerve root compression. 3. At L5-S1, there are degenerative changes causing moderate left foraminal and moderate left lateral recess stenosis encroaching upon the left L5 and S1 nerve roots without causing definite nerve root compression.   HISTORY: (copied from Dr Garth Bigness note on 09/24/2019)  I had the pleasure of seeing your patient, Robert Page,at [facility] for neurologic consultation regarding his foot numbness and  pain.  He is a 74 year old man who has had progressive dysesthesias in his feet. He has a tight sensation and mild pain that seems to go up the leg some at times. Symptoms started in his left foot. Currently symptoms are bilateral though left is worse. He feels fatigued in his legs but not weak. He sometimes gets leg cramps.  He notes no change in gait for the most part but occasionally feels off balanced. He is needing to hold the wall when he closes his eyes in the shower. Pain increases when he stands up a while. 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   He tried gabapentin and then was switched to Lyrica. He does not feel he had any benefit. Prednisone did not help.  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 Ellene Route around 2015. He had left Morton neuroma surgery about 3 years ago. He has scar tissue in his left sole.. he has had stomach/small intestine surgery. He has urinary hesitancy but has a known large prostate. Flomax and Cialis had not helped.   Studiesreviewed: MRI ankle 09/19/2017: IMPRESSION: 1. Normal peroneal tendons. 2. No acute osseous abnormality. 3. Small posterior subtalar joint effusion.  However another review of the MRI found Achilles and peroneal brevis tendinosis and plantar fasciitis  MRI cervical spine 12/11/2017. I personally reviewed these images and concur with the official impression below. There is some foraminal narrowing to the right at the involved levels but no definite nerve root compression. There is 1 small T2 hyperintense focus within the pons. IMPRESSION: No abnormality seen to explain the presenting symptoms. Minimal, non-compressive disc bulges at C3-4, C4-5 and C5-6. No evidence of facet arthropathy.  He reports he had Lumbar MRi at [facility] but not in PACS. But he states being told nothing that would explain his symptoms.   Rheum consult 09/09/2019 reported MRI's showed chronic changes but no explanation for pain (not sure which MRI). ACE, RF, CCP, ANA, Lyme, CRP, ESR, HLA, CK, uric acid and ANCA were normal or negative   REVIEW OF SYSTEMS: Out of a complete 14 system review of symptoms, the patient complains only of the following symptoms, bilateral feet and leg pain,  numbness, back pain and all other reviewed systems are negative.   ALLERGIES: Allergies  Allergen Reactions  . Ibuprofen Other (See Comments)    stomach cramps   . Sulfonamide Derivatives Itching    HOME MEDICATIONS: Outpatient Medications Prior to Visit  Medication Sig Dispense Refill  . amLODipine (NORVASC) 5 MG tablet TAKE 1 TABLET BY MOUTH EVERY DAY 90 tablet 3  . aspirin EC 81 MG tablet Take 81 mg by mouth daily.    . fexofenadine (ALLEGRA) 180 MG tablet Take 180 mg by mouth daily.     . fluticasone (FLONASE) 50 MCG/ACT nasal spray Place 2 sprays into both nostrils daily. (Patient taking differently: Place 2 sprays into both nostrils as needed. ) 16 g 6  . hydrochlorothiazide (HYDRODIURIL) 25 MG tablet TAKE 1 TABLET BY MOUTH EVERY DAY 90 tablet 3  . losartan (COZAAR) 100 MG tablet TAKE 1 TABLET BY MOUTH EVERY DAY 30 tablet 30  . omeprazole (PRILOSEC) 20 MG capsule Take 20 mg by mouth daily.    Marland Kitchen amitriptyline (ELAVIL) 25 MG tablet Take 1 tablet (25 mg total) by mouth at bedtime. 30 tablet 3   No facility-administered medications prior to visit.    PAST MEDICAL HISTORY: Past Medical  History:  Diagnosis Date  . Adenomatous polyp   . Allergy   . Atrial tachycardia ([place])   . Diverticulosis   . Dupuytren's disease    palms and soles  . GERD (gastroesophageal reflux disease)   . Hypertension     PAST SURGICAL HISTORY: Past Surgical History:  Procedure Laterality Date  . BACK SURGERY     12/2013  . CARDIAC CATHETERIZATION  1990's  . COLECTOMY  2010   for diverticulitis  . COLONOSCOPY  04/03/2013  . LASIK  2002   [place]  . POLYPECTOMY      FAMILY HISTORY: Family History  Problem Relation Age of Onset  . Hypertension Mother   . Macular degeneration Mother   . Kidney disease Mother   . Dementia Mother   . Hypertension Father   . Melanoma Sister   . Stroke Other        uncle  . Prostate cancer Other        uncle  . Colon cancer Other        grandmother/uncle    . Kidney disease Other        grandmother  . Liver cancer Other        uncle (mets)  . Heart disease Other        aunt/uncle  . Lung cancer Other        uncle  . Stomach cancer Cousin   . Heart disease Cousin   . Colon cancer Paternal Grandmother   . Colon cancer Paternal Uncle   . Prostate cancer Paternal Uncle   . Arthritis Brother   . Hypertension Brother   . Esophageal cancer Neg Hx   . Rectal cancer Neg Hx     SOCIAL HISTORY: Social History   Socioeconomic History  . Marital status: Married    Spouse name: Lovis More  . Number of children: 3  . Years of education: Not on file  . Highest education level: Not on file  Occupational History  . Occupation: Retired  Tobacco Use  . Smoking status: Former Smoker    Types: Cigars    Quit date: 2015    Years since quitting: 6.5  . Smokeless tobacco: Never Used  . Tobacco comment: Pt states smokes occasional cigar.  This is typically < 1 time per month  Vaping Use  . Vaping Use: Never used  Substance and Sexual Activity  . Alcohol use: No    Alcohol/week: 0.0 standard drinks  . Drug use: No  . Sexual activity: Not on file  Other Topics Concern  . Not on file  Social History Narrative   Pt lives in [place].     Retired from [employer] and [employer].     Goes on mission trips to [place], gets regular exercise.   Caffeine use: very rare, Diet coke   Right handed    Social Determinants of Health   Financial Resource Strain: Low Risk   . Difficulty of Paying Living Expenses: Not hard at all  Food Insecurity:   . Worried About Charity fundraiser in the Last Year:   . Arboriculturist in the Last Year:   Transportation Needs:   . Film/video editor (Medical):   Marland Kitchen Lack of Transportation (Non-Medical):   Physical Activity:   . Days of Exercise per Week:   . Minutes of Exercise per Session:   Stress:   . Feeling of Stress :   Social Connections:   . Frequency of Communication  with Friends and Family:   .  Frequency of Social Gatherings with Friends and Family:   . Attends Religious Services:   . Active Member of Clubs or Organizations:   . Attends Archivist Meetings:   Marland Kitchen Marital Status:   Intimate Partner Violence:   . Fear of Current or Ex-Partner:   . Emotionally Abused:   Marland Kitchen Physically Abused:   . Sexually Abused:       PHYSICAL EXAM  Vitals:   03/11/20 1101  BP: 132/75  Pulse: 70  Weight: 219 lb (99.3 kg)  Height: _0  (1.753 m)   Body mass index is 32.34 kg/m.  Generalized: Well developed, in no acute distress  Cardiology: normal rate and rhythm, no murmur noted Respiratory: clear to auscultation bilaterally  Neurological examination  Mentation: Alert oriented to time, place, history taking. Follows all commands speech and language fluent Cranial nerve II-XII: Pupils were equal round reactive to light. Extraocular movements were full, visual field were full  Motor: The motor testing reveals 5 over 5 strength of all 4 extremities. Good symmetric motor tone is noted throughout.  Gait and station: Gait is normal.   DIAGNOSTIC DATA (LABS, IMAGING, TESTING) - I reviewed patient records, labs, notes, testing and imaging myself where available.  No flowsheet data found.   Lab Results  Component Value Date   WBC 8.0 10/29/2017   HGB 16.0 10/29/2017   HCT 47.3 10/29/2017   MCV 86.6 10/29/2017   PLT 269 10/29/2017      Component Value Date/Time   NA 138 03/17/2019 1440   NA 142 12/12/2017 1511   K 3.7 03/17/2019 1440   CL 103 03/17/2019 1440   CO2 28 03/17/2019 1440   GLUCOSE 132 (H) 03/17/2019 1440   BUN 17 03/17/2019 1440   BUN 24 12/12/2017 1511   CREATININE 1.05 03/17/2019 1440   CALCIUM 9.4 03/17/2019 1440   PROT 7.1 09/24/2019 1034   ALBUMIN 4.3 11/13/2016 0854   AST 15 10/29/2017 1509   ALT 17 10/29/2017 1509   ALKPHOS 71 11/13/2016 0854   BILITOT 0.4 10/29/2017 1509   GFRNONAA 71 03/17/2019 1440   GFRAA 82 03/17/2019 1440   Lab Results   Component Value Date   CHOL 167 11/19/2017   HDL 52 11/19/2017   LDLCALC 100 (H) 11/19/2017   TRIG 63 11/19/2017   CHOLHDL 3.2 11/19/2017   Lab Results  Component Value Date   HGBA1C CANCELED 03/17/2019   Lab Results  Component Value Date   VITAMINB12 328 03/17/2019   Lab Results  Component Value Date   TSH 0.68 03/17/2019       ASSESSMENT AND PLAN 74 y.o. year old male  has a past medical history of Adenomatous polyp, Allergy, Atrial tachycardia ([place]), Diverticulosis, Dupuytren's disease, GERD (gastroesophageal reflux disease), and Hypertension. here with     ICD-10-CM   1. Polyneuropathy  G62.9 amitriptyline (ELAVIL) 10 MG tablet    DULoxetine (CYMBALTA) 30 MG capsule  2. Lumbar radiculopathy  M54.16   3. Dysesthesia  R20.8   4. Left foot pain  M79.672   5. Chronic bilateral low back pain, unspecified whether sciatica present  M54.5    G89.29     Robert Page continues to note bilateral foot, leg and back pain.  He continues to see orthopedics and was recently seen by neurosurgery.  Dr. Ellene Route is planning to repeat lumbar spinal injections to see if this is beneficial.  Multiple neuropathic pain medications have been ineffective.  He  does feel that amitriptyline has helped with his sleep patterns but he feels groggy for about 2 hours in the morning.  It has not been effective in pain relief.  We will decrease amitriptyline dose to 10 mg at bedtime.  I will also try duloxetine 30 mg daily.  I anticipate needing to increase this dose and will do so if well tolerated.  He is aware to call me in 2 to 3 weeks and we will increase dose to 60 mg daily.  I am hopeful that this may provide some additional pain relief and support with anxiety.  He was encouraged to stay active.  He will continue close follow-up with care team.  I will have him come in to follow-up with Dr. Ladonna Snide in 3 to 4 months for evaluation.  He verbalizes understanding and agreement this plan.   No orders of the defined  types were placed in this encounter.    Meds ordered this encounter  Medications  . amitriptyline (ELAVIL) 10 MG tablet    Sig: Take 1 tablet (10 mg total) by mouth at bedtime.    Dispense:  30 tablet    Refill:  3    Order Specific Question:   Supervising Provider    Answer:   Melvenia Beam [id number]  . DULoxetine (CYMBALTA) 30 MG capsule    Sig: Take 1 capsule (30 mg total) by mouth daily.    Dispense:  90 capsule    Refill:  3    Order Specific Question:   Supervising Provider    Answer:   Melvenia Beam [id number]      I spent 15 minutes with the patient. 50% of this time was spent counseling and educating patient on plan of care and medications.    Debbora Presto, FNP-C 03/11/2020, 11:47 AM [facility] [address] [place], [place] [address] [phone]

## 2020-03-11 NOTE — Progress Notes (Signed)
I have read the note, and I agree with the clinical assessment and plan.  Lott Seelbach A. Lorrinda Ramstad, MD, PhD, FAAN Certified in Neurology, Clinical Neurophysiology, Sleep Medicine, Pain Medicine and Neuroimaging  [facility] [address], [address] [place], [place] [address] [phone]  

## 2020-03-22 DIAGNOSIS — M5416 Radiculopathy, lumbar region: Secondary | ICD-10-CM | POA: Diagnosis not present

## 2020-03-22 DIAGNOSIS — M5011 Cervical disc disorder with radiculopathy,  high cervical region: Secondary | ICD-10-CM | POA: Diagnosis not present

## 2020-03-30 DIAGNOSIS — H524 Presbyopia: Secondary | ICD-10-CM | POA: Diagnosis not present

## 2020-03-30 DIAGNOSIS — H2513 Age-related nuclear cataract, bilateral: Secondary | ICD-10-CM | POA: Diagnosis not present

## 2020-03-30 DIAGNOSIS — H52203 Unspecified astigmatism, bilateral: Secondary | ICD-10-CM | POA: Diagnosis not present

## 2020-04-12 ENCOUNTER — Other Ambulatory Visit: Payer: Self-pay | Admitting: Family Medicine

## 2020-06-02 DIAGNOSIS — M5136 Other intervertebral disc degeneration, lumbar region: Secondary | ICD-10-CM | POA: Diagnosis not present

## 2020-06-03 ENCOUNTER — Other Ambulatory Visit: Payer: Self-pay | Admitting: Neurological Surgery

## 2020-06-03 DIAGNOSIS — M5136 Other intervertebral disc degeneration, lumbar region: Secondary | ICD-10-CM

## 2020-06-23 ENCOUNTER — Ambulatory Visit (HOSPITAL_COMMUNITY): Admission: RE | Admit: 2020-06-23 | Payer: Medicare Other | Source: Ambulatory Visit

## 2020-06-23 ENCOUNTER — Ambulatory Visit (HOSPITAL_COMMUNITY): Payer: Medicare Other

## 2020-07-01 ENCOUNTER — Telehealth: Payer: Self-pay | Admitting: Pharmacist

## 2020-07-01 NOTE — Progress Notes (Signed)
Chronic Care Management Pharmacy Assistant   Name: Robert Page  MRN: [id number] DOB: Sep 25, 1945  Reason for Encounter: Disease State  Patient Questions:  1.  Have you seen any other providers since your last visit? Yes  2.  Any changes in your medicines or health? Yes   PCP : Susy Frizzle, MD   Their chronic conditions include: hypertension, allregic rhinitis, GERD.  Office Visit: None since his last CCM visit with the clinical pharmacist on 01-27-2020.  Consults: 03-11-2020 (Neuro) Patient presented in the office for a f/u. He has continuous bilateral leg and feet pain as well as significant back pain. Symptoms have been worsening. Patient has significant degeneration of L4-L5. Dr Ellene Route has ordered spinal injections that are in the process of being scheduled. He was prescribed Cymblata 30 mg caps and Amitriptyline 10 mg tabs.  01-28-2020 (Neuro) Patient presented in the office for his f/u for polyneuropathy. He reports low back pain, radiculopathy, muscle cramping and tightness and swelling in his feet and neuropathic pain of lower extremities and feet. He has been on multiple medications including gabapentin, Lyrica, ox carbamazepine, lamotrigine with no significant benefit. Dr. Considered starting amtriptyline to help with sleep as well as neuropathic pain.  Allergies:   Allergies  Allergen Reactions   Ibuprofen Other (See Comments)    stomach cramps    Sulfonamide Derivatives Itching    Medications: Outpatient Encounter Medications as of 07/01/2020  Medication Sig   amitriptyline (ELAVIL) 10 MG tablet Take 1 tablet (10 mg total) by mouth at bedtime.   amLODipine (NORVASC) 5 MG tablet TAKE 1 TABLET BY MOUTH EVERY DAY (Patient taking differently: Take 5 mg by mouth daily. )   aspirin EC 81 MG tablet Take 81 mg by mouth daily.   DULoxetine (CYMBALTA) 30 MG capsule Take 1 capsule (30 mg total) by mouth daily. (Patient not taking: Reported on 06/17/2020)    fexofenadine (ALLEGRA) 180 MG tablet Take 180 mg by mouth daily.    fluticasone (FLONASE) 50 MCG/ACT nasal spray Place 2 sprays into both nostrils daily. (Patient taking differently: Place 2 sprays into both nostrils at bedtime as needed for allergies. )   hydrochlorothiazide (HYDRODIURIL) 25 MG tablet TAKE 1 TABLET BY MOUTH EVERY DAY (Patient taking differently: Take 25 mg by mouth daily. )   losartan (COZAAR) 100 MG tablet TAKE 1 TABLET BY MOUTH EVERY DAY (Patient taking differently: Take 100 mg by mouth daily. )   omeprazole (PRILOSEC) 20 MG capsule Take 20 mg by mouth daily.   No facility-administered encounter medications on file as of 07/01/2020.    Current Diagnosis: Patient Active Problem List   Diagnosis Date Noted   Polyneuropathy 09/24/2019   Dysesthesia 09/24/2019   Left foot pain 09/24/2019   Chest pain, atypical 11/27/2017   Thyroid nodule 10/25/2015   Cigarette smoker 04/20/2015   Essential hypertension 03/14/2015   Cough 03/14/2015   Multiple pulmonary nodules 03/14/2015   Lower respiratory tract infection 03/14/2015   SVT (supraventricular tachycardia) ([place]) 11/03/2012   Fatigue 11/03/2012   GERD (gastroesophageal reflux disease) 10/24/2012   Plantar fasciitis 12/21/2010   Routine general medical examination at a health care facility 12/16/2010   Prostate cancer screening 12/16/2010   SHOULDER PAIN, LEFT 11/26/2009   HAND PAIN, BILATERAL 11/26/2009   PERSONAL HX COLONIC POLYPS 10/26/2008   EXTERNAL HEMORRHOIDS 09/23/2008   HYPERTENSION 01/01/2007   ALLERGIC RHINITIS 01/01/2007   DIVERTICULOSIS, COLON 01/01/2007   DIVERTICULITIS, HX OF 01/01/2007    Goals Addressed  None    Reviewed chart prior to disease state call. Spoke with patient regarding BP  Recent Office Vitals: BP Readings from Last 3 Encounters:  03/11/20 132/75  01/28/20 127/72  01/19/20 125/81   Pulse Readings from Last 3 Encounters:  03/11/20 70  01/28/20  (!) 59  01/19/20 69    Wt Readings from Last 3 Encounters:  03/11/20 219 lb (99.3 kg)  01/28/20 216 lb (98 kg)  09/24/19 219 lb (99.3 kg)     Kidney Function Lab Results  Component Value Date/Time   CREATININE 1.05 03/17/2019 02:40 PM   CREATININE 1.19 12/12/2017 03:11 PM   CREATININE 1.10 10/29/2017 03:09 PM   GFR 74.13 12/19/2010 08:44 AM   GFRNONAA 71 03/17/2019 02:40 PM   GFRAA 82 03/17/2019 02:40 PM    BMP Latest Ref Rng & Units 03/17/2019 12/12/2017 10/29/2017  Glucose 65 - 99 mg/dL 132(H) 98 91  BUN 7 - 25 mg/dL 17 24 19   Creatinine 0.70 - 1.18 mg/dL 1.05 1.19 1.10  BUN/Creat Ratio 6 - 22 (calc) NOT APPLICABLE 20 NOT APPLICABLE  Sodium 245 - 146 mmol/L 138 142 142  Potassium 3.5 - 5.3 mmol/L 3.7 4.1 5.2  Chloride 98 - 110 mmol/L 103 103 104  CO2 20 - 32 mmol/L 28 25 31   Calcium 8.6 - 10.3 mg/dL 9.4 9.3 10.0   Made an outbound call to the patient in attempt to complete a hypertension disease state call. The patient stated he was at the beach and had trouble hering me with the water. He asked that I call him back early next week.   Adherence Review: Is the patient currently on ACE/ARB medication? Yes losartan (COZAAR) 100 MG tablet Does the patient have >5 day gap between last estimated fill dates? No CPP please confirm   Follow-Up:  Pharmacist Review   Fanny Skates, [place] Pharmacist Assistant [phone]

## 2020-07-04 ENCOUNTER — Other Ambulatory Visit: Payer: Self-pay | Admitting: Family Medicine

## 2020-07-07 ENCOUNTER — Other Ambulatory Visit: Payer: Self-pay

## 2020-07-07 ENCOUNTER — Ambulatory Visit (HOSPITAL_COMMUNITY)
Admission: RE | Admit: 2020-07-07 | Discharge: 2020-07-07 | Disposition: A | Discharge status: Home / Self Care | Payer: Medicare Other | Source: Ambulatory Visit | Attending: Neurological Surgery | Admitting: Neurological Surgery

## 2020-07-07 DIAGNOSIS — M545 Low back pain, unspecified: Secondary | ICD-10-CM | POA: Diagnosis not present

## 2020-07-07 DIAGNOSIS — M48062 Spinal stenosis, lumbar region with neurogenic claudication: Secondary | ICD-10-CM | POA: Diagnosis not present

## 2020-07-07 DIAGNOSIS — M5136 Other intervertebral disc degeneration, lumbar region: Secondary | ICD-10-CM

## 2020-07-07 DIAGNOSIS — M47816 Spondylosis without myelopathy or radiculopathy, lumbar region: Secondary | ICD-10-CM | POA: Diagnosis not present

## 2020-07-07 DIAGNOSIS — M4186 Other forms of scoliosis, lumbar region: Secondary | ICD-10-CM | POA: Diagnosis not present

## 2020-07-07 MED ORDER — HYDROCODONE-ACETAMINOPHEN 5-325 MG PO TABS: 1.0000 | ORAL_TABLET | ORAL | Status: DC | PRN

## 2020-07-07 MED ORDER — LIDOCAINE HCL (PF) 1 % IJ SOLN
5.0000 mL | Freq: Once | INTRAMUSCULAR | Status: AC
  Administered 2020-07-07: 5 mL via INTRADERMAL

## 2020-07-07 MED ORDER — IOHEXOL 180 MG/ML  SOLN
20.0000 mL | Freq: Once | INTRAMUSCULAR | Status: AC | PRN
  Administered 2020-07-07: 12 mL via INTRATHECAL

## 2020-07-07 MED ORDER — DIAZEPAM 5 MG PO TABS
10.0000 mg | ORAL_TABLET | Freq: Once | ORAL | Status: AC
  Administered 2020-07-07: 10 mg via ORAL

## 2020-07-07 MED ORDER — ONDANSETRON HCL 4 MG/2ML IJ SOLN: 4.0000 mg | Freq: Four times a day (QID) | INTRAMUSCULAR | Status: DC | PRN

## 2020-07-07 MED ORDER — DIAZEPAM 5 MG PO TABS
ORAL_TABLET | ORAL | Status: AC
  Filled 2020-07-07: qty 2

## 2020-07-07 NOTE — Discharge Instructions (Signed)
Myelogram and Lumbar Puncture Discharge Instructions  1. Go home and rest quietly for the next 24 hours.  It is important to lie flat for the next 24 hours.  Get up only to go to the restroom.  You may lie in the bed or on a couch on your back, your stomach, your left side or your right side.  You may have one pillow under your head.  You may have pillows between your knees while you are on your side or under your knees while you are on your back.  2. DO NOT drive today.  Recline the seat as far back as it will go, while still wearing your seat belt, on the way home.  3. You may get up to go to the bathroom as needed.  You may sit up for 10 minutes to eat.  You may resume your normal diet and medications unless otherwise indicated.  4. The incidence of headache, nausea, or vomiting is about 5% (one in 20 patients).  If you develop a headache, lie flat and drink plenty of fluids until the headache goes away.  Caffeinated beverages may be helpful.  If you develop severe nausea and vomiting or a headache that does not go away with flat bed rest, call [phone].  5. You may resume normal activities after your 24 hours of bed rest is over; however, do not exert yourself strongly or do any heavy lifting tomorrow.  6. Call your physician for a follow-up appointment.  The results of your myelogram will be sent directly to your physician by the following day.  7. If you have any questions or if complications develop after you arrive home, please call [phone].  Discharge instructions have been explained to the patient.  The patient, or the person responsible for the patient, fully understands these instructions.   

## 2020-07-07 NOTE — Progress Notes (Signed)
Discharge instructions reviewed with patient and family. Verbalized understanding. 

## 2020-07-07 NOTE — Procedures (Signed)
[name] is a 75 year old individuals had progressively worsening back pain with radiation to both buttocks.  It seems to be worse when he is active.  He has been treated conservatively for a few years now and it appears that he has significant bony osteophytosis in the lower lumbar spine.  A myelogram is now being performed to evaluate better the degree and nature of any spondylosis and particularly to look for any motion abnormalities.  A lumbar myelogram is being performed.  Pre op Dx: Lumbar spondylosis and stenosis with neurogenic claudication. Post op Dx: Same Procedure: Lumbar myelogram Surgeon: Venora Kautzman Puncture level: L2-3 Fluid color: Clear colorless Injection: Isovue 180, 12 mL Findings: Moderate stenosis at L4-L5.  No significant abnormalities with motion.  Further evaluation with CT scanning across the thoracolumbar junction.

## 2020-07-09 DIAGNOSIS — Z6832 Body mass index (BMI) 32.0-32.9, adult: Secondary | ICD-10-CM | POA: Diagnosis not present

## 2020-07-09 DIAGNOSIS — R030 Elevated blood-pressure reading, without diagnosis of hypertension: Secondary | ICD-10-CM | POA: Diagnosis not present

## 2020-07-09 DIAGNOSIS — M79604 Pain in right leg: Secondary | ICD-10-CM | POA: Diagnosis not present

## 2020-07-23 ENCOUNTER — Other Ambulatory Visit: Payer: Self-pay | Admitting: Family Medicine

## 2020-07-26 ENCOUNTER — Ambulatory Visit: Payer: Medicare Other

## 2020-07-26 DIAGNOSIS — G629 Polyneuropathy, unspecified: Secondary | ICD-10-CM

## 2020-07-26 DIAGNOSIS — J309 Allergic rhinitis, unspecified: Secondary | ICD-10-CM

## 2020-07-26 NOTE — Patient Instructions (Addendum)
Visit Information  Goals Addressed            This Visit's Progress   . Pharmacy Care Plan:       CARE PLAN ENTRY  Current Barriers:  . Chronic Disease Management support, education, and care coordination needs related to hypertension and neuropathy/foot numbness.   Hypertension . Pharmacist Clinical Goal(s): o Over the next 180 days, patient will work with PharmD and providers to maintain BP goal <140/90 . Current regimen:  o Amlodipine 5mg , HCTZ 25mg , losartan 100mg  . Interventions: o Comprehensive medication review o BP controlled, continue current therapy . Patient self care activities - Over the next 180 days, patient will: o Check BP based on symptoms, document, and provide at future appointments o Ensure daily salt intake < 2300 mg/day Neuropathy/Leg numbness . Pharmacist Clinical Goal(s) o Over the next 30 days, patient will work with PharmD and providers to optimize medications and relieve symptoms of foot pain/neuropathy. . Current regimen:  o No medications currently . Interventions: o Comprehensive medication review o Recommend supplementation with B12 OTC o Recommend updated labs and physical . Patient self care activities - Over the next 30 days, patient will: o Follow up with ortho for second opinion on legs/feet. o Continue to report increasing or stagnant symptoms to PharmD or providers. o Begin taking vitamin B12 again OTC o Report to PCP for updated labs and physical   Please see past updates section for initial goals and further documentation       The patient verbalized understanding of instructions, educational materials, and care plan provided today and agreed to receive a mailed copy of patient instructions, educational materials, and care plan.   Telephone follow up appointment with pharmacy team member scheduled for: 3 months  Beverly Milch, PharmD Clinical Pharmacist [facility] [phone]   Peripheral  Neuropathy Peripheral neuropathy is a type of nerve damage. It affects nerves that carry signals between the spinal cord and the arms, legs, and the rest of the body (peripheral nerves). It does not affect nerves in the spinal cord or brain. In peripheral neuropathy, one nerve or a group of nerves may be damaged. Peripheral neuropathy is a broad category that includes many specific nerve disorders, like diabetic neuropathy, hereditary neuropathy, and carpal tunnel syndrome. What are the causes? This condition may be caused by:  Diabetes. This is the most common cause of peripheral neuropathy.  Nerve injury.  Pressure or stress on a nerve that lasts a long time.  Lack (deficiency) of B vitamins. This can result from alcoholism, poor diet, or a restricted diet.  Infections.  Autoimmune diseases, such as rheumatoid arthritis and systemic lupus erythematosus.  Nerve diseases that are passed from parent to child (inherited).  Some medicines, such as cancer medicines (chemotherapy).  Poisonous (toxic) substances, such as lead and mercury.  Too little blood flowing to the legs.  Kidney disease.  Thyroid disease. In some cases, the cause of this condition is not known. What are the signs or symptoms? Symptoms of this condition depend on which of your nerves is damaged. Common symptoms include:  Loss of feeling (numbness) in the feet, hands, or both.  Tingling in the feet, hands, or both.  Burning pain.  Very sensitive skin.  Weakness.  Not being able to move a part of the body (paralysis).  Muscle twitching.  Clumsiness or poor coordination.  Loss of balance.  Not being able to control your bladder.  Feeling dizzy.  Sexual problems. How  is this diagnosed? Diagnosing and finding the cause of peripheral neuropathy can be difficult. Your health care provider will take your medical history and do a physical exam. A neurological exam will also be done. This involves  checking things that are affected by your brain, spinal cord, and nerves (nervous system). For example, your health care provider will check your reflexes, how you move, and what you can feel. You may have other tests, such as:  Blood tests.  Electromyogram (EMG) and nerve conduction tests. These tests check nerve function and how well the nerves are controlling the muscles.  Imaging tests, such as CT scans or MRI to rule out other causes of your symptoms.  Removing a small piece of nerve to be examined in a lab (nerve biopsy). This is rare.  Removing and examining a small amount of the fluid that surrounds the brain and spinal cord (lumbar puncture). This is rare. How is this treated? Treatment for this condition may involve:  Treating the underlying cause of the neuropathy, such as diabetes, kidney disease, or vitamin deficiencies.  Stopping medicines that can cause neuropathy, such as chemotherapy.  Medicine to relieve pain. Medicines may include: ? Prescription or over-the-counter pain medicine. ? Antiseizure medicine. ? Antidepressants. ? Pain-relieving patches that are applied to painful areas of skin.  Surgery to relieve pressure on a nerve or to destroy a nerve that is causing pain.  Physical therapy to help improve movement and balance.  Devices to help you move around (assistive devices). Follow these instructions at home: Medicines  Take over-the-counter and prescription medicines only as told by your health care provider. Do not take any other medicines without first asking your health care provider.  Do not drive or use heavy machinery while taking prescription pain medicine. Lifestyle   Do not use any products that contain nicotine or tobacco, such as cigarettes and e-cigarettes. Smoking keeps blood from reaching damaged nerves. If you need help quitting, ask your health care provider.  Avoid or limit alcohol. Too much alcohol can cause a vitamin B deficiency,  and vitamin B is needed for healthy nerves.  Eat a healthy diet. This includes: ? Eating foods that are high in fiber, such as fresh fruits and vegetables, whole grains, and beans. ? Limiting foods that are high in fat and processed sugars, such as fried or sweet foods. General instructions   If you have diabetes, work closely with your health care provider to keep your blood sugar under control.  If you have numbness in your feet: ? Check every day for signs of injury or infection. Watch for redness, warmth, and swelling. ? Wear padded socks and comfortable shoes. These help protect your feet.  Develop a good support system. Living with peripheral neuropathy can be stressful. Consider talking with a mental health specialist or joining a support group.  Use assistive devices and attend physical therapy as told by your health care provider. This may include using a walker or a cane.  Keep all follow-up visits as told by your health care provider. This is important. Contact a health care provider if:  You have new signs or symptoms of peripheral neuropathy.  You are struggling emotionally from dealing with peripheral neuropathy.  Your pain is not well-controlled. Get help right away if:  You have an injury or infection that is not healing normally.  You develop new weakness in an arm or leg.  You fall frequently. Summary  Peripheral neuropathy is when the nerves in the  arms, or legs are damaged, resulting in numbness, weakness, or pain.  There are many causes of peripheral neuropathy, including diabetes, pinched nerves, vitamin deficiencies, autoimmune disease, and hereditary conditions.  Diagnosing and finding the cause of peripheral neuropathy can be difficult. Your health care provider will take your medical history, do a physical exam, and do tests, including blood tests and nerve function tests.  Treatment involves treating the underlying cause of the neuropathy and taking  medicines to help control pain. Physical therapy and assistive devices may also help. This information is not intended to replace advice given to you by your health care provider. Make sure you discuss any questions you have with your health care provider. Document Revised: 08/03/2017 Document Reviewed: 10/30/2016 Elsevier Patient Education  2020 [employer].

## 2020-07-26 NOTE — Chronic Care Management (AMB) (Addendum)
Chronic Care Management Pharmacy  Name: Robert Page  MRN: [id number] DOB: 06/20/1946  Chief Complaint/ HPI  Robert Page,  74 y.o. , male presents for their Follow-Up CCM visit with the clinical pharmacist In office.  PCP : Susy Frizzle, MD  Their chronic conditions include: hypertension, allregic rhinitis, GERD. Office Visits:  07/25/2019 Robert Page)  - peripheral neuropathy was on gabapentin and Lyrica, has since stopped both of those  Consult Visit: 03/11/2020 (Neurology) - Started on duloxetine 30mg  daily with plans to increase to 60 mg.  This to help ongoing foot pain and help with anxiety.  09/24/2019 (Robert Page) - started oxcarbazepine 300mg  titrating up to 1 qam and 2 qpm, nerve pain in foot  Medications: Outpatient Encounter Medications as of 07/26/2020  Medication Sig   amLODipine (NORVASC) 5 MG tablet TAKE 1 TABLET BY MOUTH EVERY DAY (Patient taking differently: Take 5 mg by mouth daily. )   aspirin EC 81 MG tablet Take 81 mg by mouth daily.   fexofenadine (ALLEGRA) 180 MG tablet Take 180 mg by mouth daily.    fluticasone (FLONASE) 50 MCG/ACT nasal spray SPRAY 2 SPRAYS INTO EACH NOSTRIL EVERY DAY   hydrochlorothiazide (HYDRODIURIL) 25 MG tablet TAKE 1 TABLET BY MOUTH EVERY DAY (Patient taking differently: Take 25 mg by mouth daily. )   losartan (COZAAR) 100 MG tablet TAKE 1 TABLET BY MOUTH EVERY DAY   omeprazole (PRILOSEC) 20 MG capsule Take 20 mg by mouth daily.   [DISCONTINUED] amitriptyline (ELAVIL) 10 MG tablet Take 1 tablet (10 mg total) by mouth at bedtime.   [DISCONTINUED] DULoxetine (CYMBALTA) 30 MG capsule Take 1 capsule (30 mg total) by mouth daily.   No facility-administered encounter medications on file as of 07/26/2020.     Current Diagnosis/Assessment:    Robert Page: Low Risk    Difficulty of Paying Living Expenses: Not hard at all   Goals Addressed             This Visit's Progress    Pharmacy Care Plan:       CARE  PLAN ENTRY  Current Barriers:  Chronic Disease Management support, education, and care coordination needs related to hypertension and neuropathy/foot numbness.   Hypertension Pharmacist Clinical Goal(s): Over the next 180 days, patient will work with PharmD and providers to maintain BP goal <140/90 Current regimen:  Amlodipine 5mg , HCTZ 25mg , losartan 100mg  Interventions: Comprehensive medication review BP controlled, continue current therapy Patient self care activities - Over the next 180 days, patient will: Check BP based on symptoms, document, and provide at future appointments Ensure daily salt intake < 2300 mg/day Neuropathy/Leg numbness Pharmacist Clinical Goal(s) Over the next 30 days, patient will work with PharmD and providers to optimize medications and relieve symptoms of foot pain/neuropathy. Current regimen:  No medications currently Interventions: Comprehensive medication review Recommend supplementation with B12 OTC Recommend updated labs and physical Patient self care activities - Over the next 30 days, patient will: Follow up with ortho for second opinion on legs/feet. Continue to report increasing or stagnant symptoms to PharmD or providers. Begin taking vitamin B12 again OTC Report to PCP for updated labs and physical   Please see past updates section for initial goals and further documentation        Hypertension   Office blood pressures are  BP Readings from Last 3 Encounters:  07/07/20 128/72  03/11/20 132/75  01/28/20 127/72    Patient has failed these meds in the past: olmesartan  Not currently  checking blood pressure at home.  Patient is currently controlled on the following medications:  amlodipine 5 mg HCTZ 25mg  losartan 100mg   We discussed: Denies swelling No medication changes No BP logs, however BP has been well controlled Denies dizziness, headaches  Plan  Continue current medications      GERD    Patient has failed  these meds in past: famotidine Patient is currently controlled on the following medications:  omeprazole 20mg    Currently controlled with omeprazole, symptomatic when does not take medication, reports compliance daily  Plan  Continue current medications  Allergic Rhinitis    Patient has failed these meds in past: none noted Patient is currently controlled on the following medications:  Allegra 180mg  Flonase 80mcg  Currently taking these and they are alleviating allergy symptoms.  No concerns with price, reports compliance and verbalizes correct way of taking.   Patient also reports nagging cough, recommended OTC Mucinex since he is coughing up mucus.  Counseled on drinking plenty of fluids with Mucinex.   Plan  Continue current medications, contact providers with any increase in symptoms.  Foot Pain/Numbness    Patient has failed these meds in past: oxcarbazepine, lamotrigine, gabapentin, Lyrica, amitriptyline Patient is currently uncontrolled using the following medications: No medications at this time   Patient pain currently uncontrolled.  He is very frustrated by thing lingering issue.  He tried Duloxetine and Amitiyptyline recently with no positive results.  Reports amitriptyline made him drowsy during the day, does not remember much about duloxetine.  Discussed possibility of starting gabapentin again and titration up to an effective dose.  Also recommended trial of OTC vitamin B12, although last B12 was in range it has been > 1 year since it was last checked.  Recommended patient come in for physical to have updated lab work.     Plan  Continue current medications. Recommend possible re-trial of gabapentin 300mg  tid and titrate up if necessary.  Vaccines   Reviewed and discussed patient's vaccination history.    Immunization History  Administered Date(s) Administered   H1N1 09/23/2008   Influenza Split 06/04/2014   Influenza,inj,Quad PF,6+ Mos 08/08/2013,  05/17/2015, 10/17/2016   PFIZER SARS-COV-2 Vaccination 09/25/2019, 10/16/2019   Pneumococcal Conjugate-13 10/17/2016   Pneumococcal Polysaccharide-23 03/11/2013   Td 03/04/2004   Tdap 10/17/2016    Plan  Recommended .   Medication Management   Miscellaneous medications: OTC's:  ASA 81mg  Patient currently uses [facility].  Phone #  [phone] Patient reports using pill box method to organize medications and promote adherence. Patient denies missed doses of medication.  Beverly Milch, PharmD Clinical Pharmacist [facility] [phone] I have collaborated with the care management provider regarding care management and care coordination activities outlined in this encounter and have reviewed this encounter including documentation in the note and care plan. I am certifying that I agree with the content of this note and encounter as supervising physician.

## 2020-07-31 ENCOUNTER — Other Ambulatory Visit: Payer: Self-pay | Admitting: Family Medicine

## 2020-08-10 ENCOUNTER — Other Ambulatory Visit: Payer: Self-pay

## 2020-08-10 ENCOUNTER — Ambulatory Visit (INDEPENDENT_AMBULATORY_CARE_PROVIDER_SITE_OTHER): Payer: Medicare Other | Admitting: Family Medicine

## 2020-08-10 VITALS — BP 150/80 | HR 62 | Temp 97.4°F | Ht 69.0 in | Wt 228.0 lb

## 2020-08-10 DIAGNOSIS — H60332 Swimmer's ear, left ear: Secondary | ICD-10-CM

## 2020-08-10 DIAGNOSIS — Z23 Encounter for immunization: Secondary | ICD-10-CM

## 2020-08-10 MED ORDER — NEOMYCIN-POLYMYXIN-HC 3.5-10000-1 OT SOLN: 3.0000 [drp] | Freq: Four times a day (QID) | OTIC | 0 refills | Status: DC

## 2020-08-10 NOTE — Progress Notes (Signed)
Subjective:    Patient ID: Robert Page, male    DOB: 05/18/1946, 74 y.o.   MRN: [id number]  HPI  Patient reports pain and hearing loss in his left ear.  On examination, he has a cerumen impaction in his left ear.  The canal itself is erythematous swollen and narrowed due to otitis externa as well.  Is extremely tender to palpation using a small curette. Past Medical History:  Diagnosis Date  . Adenomatous polyp   . Allergy   . Atrial tachycardia ([place])   . Diverticulosis   . Dupuytren's disease    palms and soles  . GERD (gastroesophageal reflux disease)   . Hypertension    Past Surgical History:  Procedure Laterality Date  . BACK SURGERY     12/2013  . CARDIAC CATHETERIZATION  1990's  . COLECTOMY  2010   for diverticulitis  . COLONOSCOPY  04/03/2013  . LASIK  2002   [place]  . POLYPECTOMY     Current Outpatient Medications on File Prior to Visit  Medication Sig Dispense Refill  . amLODipine (NORVASC) 5 MG tablet TAKE 1 TABLET BY MOUTH EVERY DAY (Patient taking differently: Take 5 mg by mouth daily. ) 90 tablet 3  . aspirin EC 81 MG tablet Take 81 mg by mouth daily.    . fexofenadine (ALLEGRA) 180 MG tablet Take 180 mg by mouth daily.     . fluticasone (FLONASE) 50 MCG/ACT nasal spray SPRAY 2 SPRAYS INTO EACH NOSTRIL EVERY DAY 48 mL 0  . hydrochlorothiazide (HYDRODIURIL) 25 MG tablet TAKE 1 TABLET BY MOUTH EVERY DAY (Patient taking differently: Take 25 mg by mouth daily. ) 90 tablet 3  . losartan (COZAAR) 100 MG tablet TAKE 1 TABLET BY MOUTH EVERY DAY 30 tablet 3  . omeprazole (PRILOSEC) 20 MG capsule Take 20 mg by mouth daily.    . pregabalin (LYRICA) 100 MG capsule Take 100 mg by mouth 2 (two) times daily.    . vitamin B-12 (CYANOCOBALAMIN) 500 MCG tablet Take 500 mcg by mouth in the morning and at bedtime.     No current facility-administered medications on file prior to visit.   Allergies  Allergen Reactions  . Ibuprofen Other (See Comments)    stomach cramps   .  Sulfonamide Derivatives Itching   Social History   Socioeconomic History  . Marital status: Married    Spouse name: Wilian Kwong  . Number of children: 3  . Years of education: Not on file  . Highest education level: Not on file  Occupational History  . Occupation: Retired  Tobacco Use  . Smoking status: Former Smoker    Types: Cigars    Quit date: 2015    Years since quitting: 6.9  . Smokeless tobacco: Never Used  . Tobacco comment: Pt states smokes occasional cigar.  This is typically < 1 time per month  Vaping Use  . Vaping Use: Never used  Substance and Sexual Activity  . Alcohol use: No    Alcohol/week: 0.0 standard drinks  . Drug use: No  . Sexual activity: Not on file  Other Topics Concern  . Not on file  Social History Narrative   Pt lives in [place].     Retired from [employer] and [employer].     Goes on mission trips to [place], gets regular exercise.   Caffeine use: very rare, Diet coke   Right handed    Social Determinants of Health   Financial Resource Strain: Low Risk   .  Difficulty of Paying Living Expenses: Not hard at all  Food Insecurity:   . Worried About Charity fundraiser in the Last Year: Not on file  . Ran Out of Food in the Last Year: Not on file  Transportation Needs:   . Lack of Transportation (Medical): Not on file  . Lack of Transportation (Non-Medical): Not on file  Physical Activity:   . Days of Exercise per Week: Not on file  . Minutes of Exercise per Session: Not on file  Stress:   . Feeling of Stress : Not on file  Social Connections:   . Frequency of Communication with Friends and Family: Not on file  . Frequency of Social Gatherings with Friends and Family: Not on file  . Attends Religious Services: Not on file  . Active Member of Clubs or Organizations: Not on file  . Attends Archivist Meetings: Not on file  . Marital Status: Not on file  Intimate Partner Violence:   . Fear of Current or Ex-Partner: Not on file   . Emotionally Abused: Not on file  . Physically Abused: Not on file  . Sexually Abused: Not on file     Review of Systems  All other systems reviewed and are negative.      Objective:   Physical Exam Constitutional:      Appearance: Normal appearance.  HENT:     Right Ear: Tympanic membrane and ear canal normal.     Left Ear: There is impacted cerumen.     Nose: Nose normal.  Cardiovascular:     Rate and Rhythm: Normal rate and regular rhythm.  Pulmonary:     Effort: Pulmonary effort is normal.     Breath sounds: Normal breath sounds.  Neurological:     Mental Status: He is alert.       Assessment & Plan:  Acute swimmer's ear of left side  Patient has a cerumen impaction in his left ear.  This was removed with irrigation and lavage.  However he also has complicating otitis externa which I will treat with Cortisporin HC otic 3 drops 4 times daily for 10 days.

## 2020-08-30 ENCOUNTER — Other Ambulatory Visit: Payer: Self-pay

## 2020-08-30 ENCOUNTER — Other Ambulatory Visit: Payer: Self-pay | Admitting: Family Medicine

## 2020-08-30 ENCOUNTER — Ambulatory Visit (INDEPENDENT_AMBULATORY_CARE_PROVIDER_SITE_OTHER): Payer: Medicare Other | Admitting: Family Medicine

## 2020-08-30 VITALS — BP 130/76 | HR 67 | Temp 97.3°F | Ht 69.0 in | Wt 225.0 lb

## 2020-08-30 DIAGNOSIS — R208 Other disturbances of skin sensation: Secondary | ICD-10-CM

## 2020-08-30 DIAGNOSIS — I10 Essential (primary) hypertension: Secondary | ICD-10-CM

## 2020-08-30 DIAGNOSIS — Z0001 Encounter for general adult medical examination with abnormal findings: Secondary | ICD-10-CM

## 2020-08-30 DIAGNOSIS — Z0000 Encounter for general adult medical examination without abnormal findings: Secondary | ICD-10-CM

## 2020-08-30 DIAGNOSIS — M79672 Pain in left foot: Secondary | ICD-10-CM | POA: Diagnosis not present

## 2020-08-30 DIAGNOSIS — Z23 Encounter for immunization: Secondary | ICD-10-CM | POA: Diagnosis not present

## 2020-08-30 DIAGNOSIS — G629 Polyneuropathy, unspecified: Secondary | ICD-10-CM

## 2020-08-30 LAB — COMPLETE METABOLIC PANEL WITH GFR
AG Ratio: 1.8 (calc) (ref 1.0–2.5)
ALT: 19 U/L (ref 9–46)
AST: 16 U/L (ref 10–35)
Albumin: 4.5 g/dL (ref 3.6–5.1)
Alkaline phosphatase (APISO): 66 U/L (ref 35–144)
BUN: 24 mg/dL (ref 7–25)
CO2: 30 mmol/L (ref 20–32)
Calcium: 9.8 mg/dL (ref 8.6–10.3)
Chloride: 102 mmol/L (ref 98–110)
Creat: 1.12 mg/dL (ref 0.70–1.18)
GFR, Est African American: 76 mL/min/{1.73_m2} (ref >=60)
GFR, Est Non African American: 65 mL/min/{1.73_m2} (ref >=60)
Globulin: 2.5 g/dL (calc) (ref 1.9–3.7)
Glucose, Bld: 100 mg/dL — ABNORMAL HIGH (ref 65–99)
Potassium: 4.5 mmol/L (ref 3.5–5.3)
Sodium: 141 mmol/L (ref 135–146)
Total Bilirubin: 0.4 mg/dL (ref 0.2–1.2)
Total Protein: 7 g/dL (ref 6.1–8.1)

## 2020-08-30 LAB — CBC WITH DIFFERENTIAL/PLATELET
Absolute Monocytes: 835 cells/uL (ref 200–950)
Basophils Absolute: 70 cells/uL (ref 0–200)
Basophils Relative: 0.9 %
Eosinophils Absolute: 250 cells/uL (ref 15–500)
Eosinophils Relative: 3.2 %
HCT: 47.2 % (ref 38.5–50.0)
Hemoglobin: 16.3 g/dL (ref 13.2–17.1)
Lymphs Abs: 1599 cells/uL (ref 850–3900)
MCH: 30.5 pg (ref 27.0–33.0)
MCHC: 34.5 g/dL (ref 32.0–36.0)
MCV: 88.2 fL (ref 80.0–100.0)
MPV: 10.5 fL (ref 7.5–12.5)
Monocytes Relative: 10.7 %
Neutro Abs: 5047 cells/uL (ref 1500–7800)
Neutrophils Relative %: 64.7 %
Platelets: 275 10*3/uL (ref 140–400)
RBC: 5.35 10*6/uL (ref 4.20–5.80)
RDW: 12 % (ref 11.0–15.0)
Total Lymphocyte: 20.5 %
WBC: 7.8 10*3/uL (ref 3.8–10.8)

## 2020-08-30 LAB — LIPID PANEL
Cholesterol: 193 mg/dL (ref <200)
HDL: 48 mg/dL (ref >=40)
LDL Cholesterol (Calc): 125 mg/dL (calc) — ABNORMAL HIGH
Non-HDL Cholesterol (Calc): 145 mg/dL (calc) — ABNORMAL HIGH (ref <130)
Total CHOL/HDL Ratio: 4 (calc) (ref <5.0)
Triglycerides: 102 mg/dL (ref <150)

## 2020-08-30 MED ORDER — HYDROCHLOROTHIAZIDE 25 MG PO TABS: 25.0000 mg | ORAL_TABLET | Freq: Every day | ORAL | 3 refills | Status: DC

## 2020-08-30 MED ORDER — DULOXETINE HCL 60 MG PO CPEP: 60.0000 mg | ORAL_CAPSULE | Freq: Every day | ORAL | 3 refills | Status: DC

## 2020-08-30 NOTE — Progress Notes (Signed)
Subjective:    Patient ID: Robert Page, male    DOB: May 31, 1946, 74 y.o.   MRN: [id number]  HPI Patient is a very pleasant 74 year old Caucasian male here today for complete physical exam.  Unfortunately continues to have severe pain in his feet left greater than right and in his lower legs up to his knees bilaterally.  He has had an extensive work-up thus far.  Initially I felt this was peripheral neuropathy and I treated the patient empirically with gabapentin.  He saw no benefit.  He subsequently went to a podiatrist who performed an MRI of his feet and found no evidence of any musculoskeletal problems in the feet that would cause his pain.  He subsequently went to a neurologist where he had nerve conduction studies as well as a vasculitis work-up.  He saw a neurosurgeon who performed an MRI of the lumbar spine as well as a myelogram.  He also had an empiric epidural steroid injection at L5-S1.  None of this is helped his pain.  He has been treated presumptively for peripheral neuropathy with gabapentin, Lyrica, amitriptyline, and Cymbalta.  Nothing has helped his pain thus far.  In addition to the pain he also describes a tight feeling in his feet as though his shoes were "10 sizes too small".  He has normal sensation to 10 g monofilament in both feet however he has diminished proprioception and is unable to feel a tuning fork in either foot bilaterally.  He has excellent pulses both dorsalis pedis and posterior tibialis bilaterally.  His immunizations are up-to-date.  He is receiving his Covid booster today.  He is due for the shingles shot but he has declined that due to his past history of shingles.  His colonoscopy was performed in 2019 and is due for repeat colonoscopy in 2024 due to tubular adenomas.  He sees a urologist to check his prostate Immunization History  Administered Date(s) Administered  . Fluad Quad(high Dose 65+) 08/10/2020  . H1N1 09/23/2008  . Influenza Split 06/04/2014  .  Influenza,inj,Quad PF,6+ Mos 08/08/2013, 05/17/2015, 10/17/2016  . PFIZER SARS-COV-2 Vaccination 09/25/2019, 10/16/2019  . Pneumococcal Conjugate-13 10/17/2016  . Pneumococcal Polysaccharide-23 03/11/2013  . Td 03/04/2004  . Tdap 10/17/2016    Past Medical History:  Diagnosis Date  . Adenomatous polyp   . Allergy   . Atrial tachycardia ([place])   . Diverticulosis   . Dupuytren's disease    palms and soles  . GERD (gastroesophageal reflux disease)   . Hypertension    Past Surgical History:  Procedure Laterality Date  . BACK SURGERY     12/2013  . CARDIAC CATHETERIZATION  1990's  . COLECTOMY  2010   for diverticulitis  . COLON SURGERY N/A    Phreesia 08/27/2020  . COLONOSCOPY  04/03/2013  . LASIK  2002   Bil  . POLYPECTOMY     Current Outpatient Medications on File Prior to Visit  Medication Sig Dispense Refill  . aspirin EC 81 MG tablet Take 81 mg by mouth daily.    . fexofenadine (ALLEGRA) 180 MG tablet Take 180 mg by mouth daily.    . fluticasone (FLONASE) 50 MCG/ACT nasal spray SPRAY 2 SPRAYS INTO EACH NOSTRIL EVERY DAY 48 mL 0  . losartan (COZAAR) 100 MG tablet TAKE 1 TABLET BY MOUTH EVERY DAY 30 tablet 3  . neomycin-polymyxin-hydrocortisone (CORTISPORIN) OTIC solution Place 3 drops into the left ear 4 (four) times daily. 10 mL 0  . omeprazole (PRILOSEC) 20 MG  capsule Take 20 mg by mouth daily.    . pregabalin (LYRICA) 100 MG capsule Take 100 mg by mouth 2 (two) times daily.    . vitamin B-12 (CYANOCOBALAMIN) 500 MCG tablet Take 500 mcg by mouth in the morning and at bedtime.     No current facility-administered medications on file prior to visit.   Allergies  Allergen Reactions  . Ibuprofen Other (See Comments)    stomach cramps   . Sulfonamide Derivatives Itching   Social History   Socioeconomic History  . Marital status: Married    Spouse name: Kien Colombo  . Number of children: 3  . Years of education: Not on file  . Highest education level: Not on  file  Occupational History  . Occupation: Retired  Tobacco Use  . Smoking status: Former Smoker    Types: Cigars    Quit date: 2015    Years since quitting: 6.9  . Smokeless tobacco: Never Used  . Tobacco comment: Pt states smokes occasional cigar.  This is typically < 1 time per month  Vaping Use  . Vaping Use: Never used  Substance and Sexual Activity  . Alcohol use: No    Alcohol/week: 0.0 standard drinks  . Drug use: No  . Sexual activity: Not on file  Other Topics Concern  . Not on file  Social History Narrative   Pt lives in [place].     Retired from [employer] and [employer].     Goes on mission trips to [place], gets regular exercise.   Caffeine use: very rare, Diet coke   Right handed    Social Determinants of Health   Financial Resource Strain: Low Risk   . Difficulty of Paying Living Expenses: Not hard at all  Food Insecurity: Not on file  Transportation Needs: Not on file  Physical Activity: Not on file  Stress: Not on file  Social Connections: Not on file  Intimate Partner Violence: Not on file   Family History  Problem Relation Age of Onset  . Hypertension Mother   . Macular degeneration Mother   . Kidney disease Mother   . Dementia Mother   . Hypertension Father   . Melanoma Sister   . Stroke Other        uncle  . Prostate cancer Other        uncle  . Colon cancer Other        grandmother/uncle  . Kidney disease Other        grandmother  . Liver cancer Other        uncle (mets)  . Heart disease Other        aunt/uncle  . Lung cancer Other        uncle  . Stomach cancer Cousin   . Heart disease Cousin   . Colon cancer Paternal Grandmother   . Colon cancer Paternal Uncle   . Prostate cancer Paternal Uncle   . Arthritis Brother   . Hypertension Brother   . Esophageal cancer Neg Hx   . Rectal cancer Neg Hx      Review of Systems  All other systems reviewed and are negative.      Objective:   Physical Exam Vitals reviewed.   Constitutional:      General: He is not in acute distress.    Appearance: He is well-developed. He is not diaphoretic.  HENT:     Head: Normocephalic and atraumatic.     Right Ear: External ear normal.  Left Ear: External ear normal.     Nose: Nose normal.     Mouth/Throat:     Pharynx: No oropharyngeal exudate.  Eyes:     General: No scleral icterus.       Right eye: No discharge.        Left eye: No discharge.     Conjunctiva/sclera: Conjunctivae normal.     Pupils: Pupils are equal, round, and reactive to light.  Neck:     Thyroid: No thyromegaly.     Vascular: No JVD.     Trachea: No tracheal deviation.  Cardiovascular:     Rate and Rhythm: Normal rate and regular rhythm.     Heart sounds: Normal heart sounds. No murmur heard. No friction rub. No gallop.   Pulmonary:     Effort: Pulmonary effort is normal. No respiratory distress.     Breath sounds: Normal breath sounds. No stridor. No wheezing or rales.  Chest:     Chest wall: No tenderness.  Abdominal:     General: Bowel sounds are normal. There is no distension.     Palpations: Abdomen is soft. There is no mass.     Tenderness: There is no abdominal tenderness. There is no guarding or rebound.  Musculoskeletal:        General: No tenderness. Normal range of motion.     Cervical back: Normal range of motion and neck supple.  Lymphadenopathy:     Cervical: No cervical adenopathy.  Skin:    General: Skin is warm.     Coloration: Skin is not pale.     Findings: No erythema or rash.  Neurological:     Mental Status: He is alert and oriented to person, place, and time.     Cranial Nerves: No cranial nerve deficit.     Motor: No abnormal muscle tone.     Coordination: Coordination normal.     Deep Tendon Reflexes: Reflexes are normal and symmetric.  Psychiatric:        Behavior: Behavior normal.        Thought Content: Thought content normal.        Judgment: Judgment normal.   Diminished sensation to tuning  fork bilaterally in both feet from the big toe to the ankle.  He has normal dorsalis pedis and posterior tibialis pulses bilaterally.  He has normal sensation to 10 g monofilament bilaterally in both feet.        Assessment & Plan:  Benign essential HTN - Plan: CBC with Differential/Platelet, COMPLETE METABOLIC PANEL WITH GFR, Lipid panel  Benign essential HTN - Plan: CBC with Differential/Platelet, COMPLETE METABOLIC PANEL WITH GFR, Lipid panel  Polyneuropathy  Dysesthesia  Left foot pain  General medical exam  My heart goes out to this patient.  I do not know what else to do for him.  My belief is that he is suffering from peripheral neuropathy.  I recommended a consultation with a pain clinic to see if they offer any other interventions that may help with peripheral neuropathy such as a nerve stimulator or nerve ablation.  In the meantime I will increase his Lyrica to 100 mg 3 times daily.  In 1 week if he is tolerating the higher dose I would add Cymbalta 60 mg daily to attempt dual therapy.  I would appreciate any other input the pain clinic could provide in helping with his neuropathy.  I did offer the patient a second opinion with a different neurologist however work-up to date has  been thorough.  Blood pressures acceptable.  Colon cancer screening is up-to-date.  Prostate cancer screening is performed by his urologist.  Check CBC, CMP, fasting lipid panel

## 2020-09-05 NOTE — Progress Notes (Signed)
Cardiology Office Note   Date:  09/06/2020   ID:  Robert Page, DOB Mar 24, 1946, MRN [id number]  PCP:  Susy Frizzle, MD    No chief complaint on file.  SVT  Wt Readings from Last 3 Encounters:  09/06/20 227 lb (103 kg)  08/30/20 225 lb (102.1 kg)  08/10/20 228 lb (103.4 kg)       History of Present Illness: Robert Page is a 75 y.o. male   Who has a past medical history significant for GERD, hypertension, atrial tachycardia.   The patient saw Dr. Rayann Heman in 2014 for "nonsustained SVT suspected to be atrial tachycardia".  As of 12/2012 his symptoms had markedly improved.  He was treated briefly with diltiazem but with improvement in his symptoms he stopped that.  An echocardiogram on 10/30/2012 showed normal LV systolic function with a EF 65%, bilateral atrial dilatation.  He also had a low risk Myoview.  He declined consideration of ablation or addition of medications. He has had no significant palpitations since then.    He quit smoking cigars.   Echo in 4/19 for DOE showed: Left ventricle: The cavity size was normal. There was mild   concentric hypertrophy. Systolic function was normal. The   estimated ejection fraction was in the range of 60% to 65%. Wall   motion was normal; there were no regional wall motion   abnormalities. Left ventricular diastolic function parameters   were normal. - Aortic valve: Transvalvular velocity was within the normal range.   There was no stenosis. There was no regurgitation. - Aorta: Ascending aortic diameter: 37 mm (S). - Ascending aorta: The ascending aorta was mildly dilated. - Mitral valve: Transvalvular velocity was within the normal range.   There was no evidence for stenosis. There was trivial   regurgitation. - Right ventricle: The cavity size was normal. Wall thickness was   normal. Systolic function was normal. - Atrial septum: No defect or patent foramen ovale was identified   by color flow Doppler. - Tricuspid valve:  There was mild regurgitation. - Pulmonary arteries: Systolic pressure was within the normal   range. PA peak pressure: 25 mm Hg (S).   CT aorta in 4/19: 1. Normal caliber thoracic aorta without evidence of aneurysmal disease. Maximal caliber of the aorta is 3.5 cm at the level of the tubular ascending aorta. Mild atherosclerosis present at the level of the aortic arch. 2. Calcified plaque in the distribution of the LAD. 3. Stable 5 mm subpleural right lower lobe lung nodule. This nodule has been stable for nearly 3 years and is therefore benign.   In 2019, he had perhaps 2 episodes of tachycardia.  THey lasted up to 5 seconds.  He was not bothered by it at all.     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  He does a lot of lifting.  He has a nordic-trac, but has not been using it.  Foot pain is a limitation.  Mother passed away at age 2- dementia/stroke.  Father still alive at 32.    Denies : Chest pain. Dizziness. Leg edema. Nitroglycerin use. Orthopnea. Palpitations. Paroxysmal nocturnal dyspnea. Shortness of breath. Syncope.   Foot pain has been evaluated extensively- wanted to check on taking 2 meds, Cymbalta and Lyrica together.         Past Medical History:  Diagnosis Date  . Adenomatous polyp   . Allergy   . Atrial tachycardia ([place])   . Diverticulosis   .  Dupuytren's disease    palms and soles  . GERD (gastroesophageal reflux disease)   . Hypertension     Past Surgical History:  Procedure Laterality Date  . BACK SURGERY     12/2013  . CARDIAC CATHETERIZATION  1990's  . COLECTOMY  2010   for diverticulitis  . COLON SURGERY N/A    Phreesia 08/27/2020  . COLONOSCOPY  04/03/2013  . LASIK  2002   Bil  . POLYPECTOMY       Current Outpatient Medications  Medication Sig Dispense Refill  . amLODipine (NORVASC) 5 MG tablet TAKE 1 TABLET BY MOUTH EVERY DAY 90 tablet 3  . aspirin EC 81 MG tablet Take 81 mg by mouth daily.    . DULoxetine (CYMBALTA) 60 MG capsule  Take 1 capsule (60 mg total) by mouth daily. 30 capsule 3  . fexofenadine (ALLEGRA) 180 MG tablet Take 180 mg by mouth daily.    . fluticasone (FLONASE) 50 MCG/ACT nasal spray SPRAY 2 SPRAYS INTO EACH NOSTRIL EVERY DAY 48 mL 0  . hydrochlorothiazide (HYDRODIURIL) 25 MG tablet Take 1 tablet (25 mg total) by mouth daily. 90 tablet 3  . losartan (COZAAR) 100 MG tablet TAKE 1 TABLET BY MOUTH EVERY DAY 30 tablet 3  . neomycin-polymyxin-hydrocortisone (CORTISPORIN) OTIC solution Place 3 drops into the left ear 4 (four) times daily. 10 mL 0  . omeprazole (PRILOSEC) 20 MG capsule Take 20 mg by mouth daily.    . pregabalin (LYRICA) 100 MG capsule Take 100 mg by mouth 2 (two) times daily.     No current facility-administered medications for this visit.    Allergies:   Patient has no active allergies.    Social History:  The patient  reports that he quit smoking about 7 years ago. His smoking use included cigars. He has never used smokeless tobacco. He reports that he does not drink alcohol and does not use drugs.   Family History:  The patient's family history includes Arthritis in his brother; Colon cancer in his paternal grandmother, paternal uncle, and another family member; Dementia in his mother; Heart disease in his cousin and another family member; Hypertension in his brother, father, and mother; Kidney disease in his mother and another family member; Liver cancer in an other family member; Lung cancer in an other family member; Macular degeneration in his mother; Melanoma in his sister; Prostate cancer in his paternal uncle and another family member; Stomach cancer in his cousin; Stroke in an other family member.    ROS:  Please see the history of present illness.   Otherwise, review of systems are positive for weight gain, foot pain.   All other systems are reviewed and negative.    PHYSICAL EXAM: VS:  BP 118/78   Pulse (!) 55   Ht 5\' 9"  (1.753 m)   Wt 227 lb (103 kg)   SpO2 97%   BMI  33.52 kg/m  , BMI Body mass index is 33.52 kg/m. GEN: Well nourished, well developed, in no acute distress  HEENT: normal  Neck: no JVD, carotid bruits, or masses Cardiac: RRR; no murmurs, rubs, or gallops,no edema  Respiratory:  clear to auscultation bilaterally, normal work of breathing GI: soft, nontender, nondistended, + BS MS: no deformity or atrophy  Skin: warm and dry, no rash Neuro:  Strength and sensation are intact Psych: euthymic mood, full affect   EKG:   The ekg ordered today demonstrates SB, inferolateral ST flattening- no change from 2020 ECG  Recent Labs: 08/30/2020: ALT 19; BUN 24; Creat 1.12; Hemoglobin 16.3; Platelets 275; Potassium 4.5; Sodium 141   Lipid Panel    Component Value Date/Time   CHOL 193 08/30/2020 0902   TRIG 102 08/30/2020 0902   HDL 48 08/30/2020 0902   CHOLHDL 4.0 08/30/2020 0902   VLDL 15 11/13/2016 0854   LDLCALC 125 (H) 08/30/2020 0902     Other studies Reviewed: Additional studies/ records that were reviewed today with results demonstrating: LDL 125 in 12/21.   ASSESSMENT AND PLAN:  1. DOE: THought to have some deconditioning in the past.  Foot pain limited exercise. May be related to neuropathy.  Cymbalta and Lyrica.  He is concerned about both of this medicine with his heart.  Will check with PharmD to see if this helps. Hopefully can increase exercise once feet improve.  2. Coronary calcification: Continue preventive therapy. LDL in 2019 was 100.  This has increased to 125 in 12/21.  We spoke about a whole food plant based diet.  Recheck lipids in 3 months.  If LDL over 100, would start low dose Crestor.  Whole food, plant based diet recommended.  3. SVT: Atrial tach suspected.  No palpitations.  4. Carotid artery disease: Minimal in 2018.  Preventive therapy.    Current medicines are reviewed at length with the patient today.  The patient concerns regarding his medicines were addressed.  The following changes have been made:   No change  Labs/ tests ordered today include:  No orders of the defined types were placed in this encounter.   Recommend 150 minutes/week of aerobic exercise Low fat, low carb, high fiber diet recommended  Disposition:   FU in 1 year   Signed, Larae Grooms, MD  09/06/2020 9:26 AM    [facility] [address], [place], [place]  [address] Phone: [phone]; Fax: [phone]

## 2020-09-06 ENCOUNTER — Encounter: Payer: Self-pay | Admitting: Interventional Cardiology

## 2020-09-06 ENCOUNTER — Other Ambulatory Visit: Payer: Self-pay

## 2020-09-06 ENCOUNTER — Ambulatory Visit (INDEPENDENT_AMBULATORY_CARE_PROVIDER_SITE_OTHER): Payer: 59 | Admitting: Interventional Cardiology

## 2020-09-06 VITALS — BP 118/78 | HR 55 | Ht 69.0 in | Wt 227.0 lb

## 2020-09-06 DIAGNOSIS — I471 Supraventricular tachycardia: Secondary | ICD-10-CM | POA: Diagnosis not present

## 2020-09-06 DIAGNOSIS — R0600 Dyspnea, unspecified: Secondary | ICD-10-CM | POA: Diagnosis not present

## 2020-09-06 DIAGNOSIS — I2510 Atherosclerotic heart disease of native coronary artery without angina pectoris: Secondary | ICD-10-CM

## 2020-09-06 DIAGNOSIS — I6523 Occlusion and stenosis of bilateral carotid arteries: Secondary | ICD-10-CM | POA: Diagnosis not present

## 2020-09-06 DIAGNOSIS — R0609 Other forms of dyspnea: Secondary | ICD-10-CM

## 2020-09-06 NOTE — Patient Instructions (Addendum)
Medication Instructions:  Your physician recommends that you continue on your current medications as directed. Please refer to the Current Medication list given to you today.  *If you need a refill on your cardiac medications before your next appointment, please call your pharmacy*   Lab Work: Your physician recommends that you return for lab work in: 3 months for fasting lipid panel.  If you have labs (blood work) drawn today and your tests are completely normal, you will receive your results only by: Marland Kitchen MyChart Message (if you have MyChart) OR . A paper copy in the mail If you have any lab test that is abnormal or we need to change your treatment, we will call you to review the results.  Testing/Procedures: None ordered today.   Follow-Up: At [facility], you and your health needs are our priority.  As part of our continuing mission to provide you with exceptional heart care, we have created designated Provider Care Teams.  These Care Teams include your primary Cardiologist (physician) and Advanced Practice Providers (APPs -  Physician Assistants and Nurse Practitioners) who all work together to provide you with the care you need, when you need it.  We recommend signing up for the patient portal called "MyChart".  Sign up information is provided on this After Visit Summary.  MyChart is used to connect with patients for Virtual Visits (Telemedicine).  Patients are able to view lab/test results, encounter notes, upcoming appointments, etc.  Non-urgent messages can be sent to your provider as well.   To learn more about what you can do with MyChart, go to [email or web address].    Your next appointment:   1 year(s)  The format for your next appointment:   In Person  Provider:   You may see Lance Muss, MD or one of the following Advanced Practice Providers on your designated Care Team:    Ronie Spies, PA-C  Jacolyn Reedy, PA-C   High-Fiber Diet Fiber, also called  dietary fiber, is a type of carbohydrate that is found in fruits, vegetables, whole grains, and beans. A high-fiber diet can have many health benefits. Your health care provider may recommend a high-fiber diet to help:  Prevent constipation. Fiber can make your bowel movements more regular.  Lower your cholesterol.  Relieve the following conditions: ? Swelling of veins in the anus (hemorrhoids). ? Swelling and irritation (inflammation) of specific areas of the digestive tract (uncomplicated diverticulosis). ? A problem of the large intestine (colon) that sometimes causes pain and diarrhea (irritable bowel syndrome, IBS).  Prevent overeating as part of a weight-loss plan.  Prevent heart disease, type 2 diabetes, and certain cancers. What is my plan? The recommended daily fiber intake in grams (g) includes:  38 g for men age 67 or younger.  30 g for men over age 60.  25 g for women age 16 or younger.  21 g for women over age 81. You can get the recommended daily intake of dietary fiber by:  Eating a variety of fruits, vegetables, grains, and beans.  Taking a fiber supplement, if it is not possible to get enough fiber through your diet. What do I need to know about a high-fiber diet?  It is better to get fiber through food sources rather than from fiber supplements. There is not a lot of research about how effective supplements are.  Always check the fiber content on the nutrition facts label of any prepackaged food. Look for foods that contain 5 g of fiber or more  per serving.  Talk with a diet and nutrition specialist (dietitian) if you have questions about specific foods that are recommended or not recommended for your medical condition, especially if those foods are not listed below.  Gradually increase how much fiber you consume. If you increase your intake of dietary fiber too quickly, you may have bloating, cramping, or gas.  Drink plenty of water. Water helps you to digest  fiber. What are tips for following this plan?  Eat a wide variety of high-fiber foods.  Make sure that half of the grains that you eat each day are whole grains.  Eat breads and cereals that are made with whole-grain flour instead of refined flour or white flour.  Eat brown rice, bulgur wheat, or millet instead of white rice.  Start the day with a breakfast that is high in fiber, such as a cereal that contains 5 g of fiber or more per serving.  Use beans in place of meat in soups, salads, and pasta dishes.  Eat high-fiber snacks, such as berries, raw vegetables, nuts, and popcorn.  Choose whole fruits and vegetables instead of processed forms like juice or sauce. What foods can I eat?  Fruits Berries. Pears. Apples. Oranges. Avocado. Prunes and raisins. Dried figs. Vegetables Sweet potatoes. Spinach. Kale. Artichokes. Cabbage. Broccoli. Cauliflower. Green peas. Carrots. Squash. Grains Whole-grain breads. Multigrain cereal. Oats and oatmeal. Brown rice. Barley. Bulgur wheat. Millet. Quinoa. Bran muffins. Popcorn. Rye wafer crackers. Meats and other proteins Navy, kidney, and pinto beans. Soybeans. Split peas. Lentils. Nuts and seeds. Dairy Fiber-fortified yogurt. Beverages Fiber-fortified soy milk. Fiber-fortified orange juice. Other foods Fiber bars. The items listed above may not be a complete list of recommended foods and beverages. Contact a dietitian for more options. What foods are not recommended? Fruits Fruit juice. Cooked, strained fruit. Vegetables Fried potatoes. Canned vegetables. Well-cooked vegetables. Grains White bread. Pasta made with refined flour. White rice. Meats and other proteins Fatty cuts of meat. Fried chicken or fried fish. Dairy Milk. Yogurt. Cream cheese. Sour cream. Fats and oils Butters. Beverages Soft drinks. Other foods Cakes and pastries. The items listed above may not be a complete list of foods and beverages to avoid. Contact a  dietitian for more information. Summary  Fiber is a type of carbohydrate. It is found in fruits, vegetables, whole grains, and beans.  There are many health benefits of eating a high-fiber diet, such as preventing constipation, lowering blood cholesterol, helping with weight loss, and reducing your risk of heart disease, diabetes, and certain cancers.  Gradually increase your intake of fiber. Increasing too fast can result in cramping, bloating, and gas. Drink plenty of water while you increase your fiber.  The best sources of fiber include whole fruits and vegetables, whole grains, nuts, seeds, and beans. This information is not intended to replace advice given to you by your health care provider. Make sure you discuss any questions you have with your health care provider. Document Revised: 06/25/2017 Document Reviewed: 06/25/2017 Elsevier Patient Education  2020 [employer].

## 2020-09-15 ENCOUNTER — Telehealth: Payer: Self-pay | Admitting: Pharmacist

## 2020-09-15 ENCOUNTER — Other Ambulatory Visit: Payer: Self-pay

## 2020-09-15 NOTE — Progress Notes (Addendum)
Chronic Care Management Pharmacy Assistant   Name: WINDLE HUEBERT  MRN: [id number] DOB: Aug 01, 1946  Reason for Encounter:General Disease State Call  Patient Questions:  1.  Have you seen any other providers since your last visit? Yes.    2.  Any changes in your medicines or health? Yes.   PCP : Susy Frizzle, MD   Their chronic conditions include: hypertension, allregic rhinitis, GERD.  Office Visits:  08/30/20 Dr. [name] for perpheral neuropathy. INCREASED lyric to 100 3 times daily. 08/10/20 Dr. [name] for Removal of wax. STARTED Cortisporin HC otci 3 drops 4 times daily for 10 days.  Consults:  09/06/20 Cardio for medication review. Dr. Irish Lack for .No medication changes.   Allergies:  No Active Allergies   Medications: Outpatient Encounter Medications as of 09/15/2020  Medication Sig   amLODipine (NORVASC) 5 MG tablet TAKE 1 TABLET BY MOUTH EVERY DAY   aspirin EC 81 MG tablet Take 81 mg by mouth daily.   DULoxetine (CYMBALTA) 60 MG capsule Take 1 capsule (60 mg total) by mouth daily.   fexofenadine (ALLEGRA) 180 MG tablet Take 180 mg by mouth daily.   fluticasone (FLONASE) 50 MCG/ACT nasal spray SPRAY 2 SPRAYS INTO EACH NOSTRIL EVERY DAY   hydrochlorothiazide (HYDRODIURIL) 25 MG tablet Take 1 tablet (25 mg total) by mouth daily.   losartan (COZAAR) 100 MG tablet TAKE 1 TABLET BY MOUTH EVERY DAY   neomycin-polymyxin-hydrocortisone (CORTISPORIN) OTIC solution Place 3 drops into the left ear 4 (four) times daily.   omeprazole (PRILOSEC) 20 MG capsule Take 20 mg by mouth daily.   pregabalin (LYRICA) 100 MG capsule Take 100 mg by mouth 2 (two) times daily.   No facility-administered encounter medications on file as of 09/15/2020.    Current Diagnosis: Patient Active Problem List   Diagnosis Date Noted   Polyneuropathy 09/24/2019   Dysesthesia 09/24/2019   Left foot pain 09/24/2019   Chest pain, atypical 11/27/2017   Thyroid nodule 10/25/2015   Cigarette  smoker 04/20/2015   Essential hypertension 03/14/2015   Cough 03/14/2015   Multiple pulmonary nodules 03/14/2015   Lower respiratory tract infection 03/14/2015   SVT (supraventricular tachycardia) ([place]) 11/03/2012   Fatigue 11/03/2012   GERD (gastroesophageal reflux disease) 10/24/2012   Plantar fasciitis 12/21/2010   Routine general medical examination at a health care facility 12/16/2010   Prostate cancer screening 12/16/2010   SHOULDER PAIN, LEFT 11/26/2009   HAND PAIN, BILATERAL 11/26/2009   PERSONAL HX COLONIC POLYPS 10/26/2008   EXTERNAL HEMORRHOIDS 09/23/2008   HYPERTENSION 01/01/2007   Allergic rhinitis 01/01/2007   DIVERTICULOSIS, COLON 01/01/2007   DIVERTICULITIS, HX OF 01/01/2007    Goals Addressed   None    Patient stated since he's been taking the Lyrica and Cymbalta together he's been feeling a little more drowsy but wants to continue taking them together for the next 6-8 weeks to see if he can truly see a difference. Patient stated he has not been as active as he use to be but he does think its working. He stated there is not as much pain but the stiffness is still there. Patient stated he does need another Rx for Lyrica because he's running out from the increase dose.  Follow-Up:  Pharmacist Review   Charlann Lange, RMA Clinical Pharmacist Assistant [phone]  6 minutes spent in review, coordination, and documentation.  Refill requested for Lyrica with new dose of three times daily.  Reviewed by: Beverly Milch, PharmD Clinical Pharmacist [facility] (  [phone] ° °

## 2020-09-16 MED ORDER — PREGABALIN 100 MG PO CAPS
100.0000 mg | ORAL_CAPSULE | Freq: Two times a day (BID) | ORAL | 3 refills | Status: DC
Start: 2020-09-16 — End: 2020-11-01

## 2020-09-21 ENCOUNTER — Other Ambulatory Visit: Payer: Self-pay | Admitting: Family Medicine

## 2020-10-01 ENCOUNTER — Encounter: Payer: Self-pay | Admitting: Physical Medicine & Rehabilitation

## 2020-10-01 ENCOUNTER — Telehealth: Payer: Self-pay | Admitting: Pharmacist

## 2020-10-01 NOTE — Progress Notes (Signed)
Verified Adherence Gap Information. Unable to obtain Medicare Advantage Information with [facility].  Per insurance data patient has met their colorectal cancer screening and BP is controlled under goal of <140/90.Marland Kitchen Their most recent BP is 132/75.  Fanny Skates, [place]tha Pharmacist Assistant [phone]

## 2020-10-26 ENCOUNTER — Other Ambulatory Visit: Payer: Self-pay | Admitting: Family Medicine

## 2020-10-27 ENCOUNTER — Other Ambulatory Visit: Payer: Self-pay | Admitting: Family Medicine

## 2020-10-29 ENCOUNTER — Other Ambulatory Visit: Payer: Self-pay

## 2020-10-29 ENCOUNTER — Telehealth: Payer: Self-pay

## 2020-10-29 DIAGNOSIS — I10 Essential (primary) hypertension: Secondary | ICD-10-CM

## 2020-10-29 MED ORDER — VALSARTAN 160 MG PO TABS: 160.0000 mg | ORAL_TABLET | Freq: Every day | ORAL | 1 refills | Status: DC

## 2020-10-29 NOTE — Telephone Encounter (Signed)
Spoke to pt, med called in, discontinued losartan in med chart

## 2020-10-29 NOTE — Telephone Encounter (Signed)
He could switch to valsartan 160 g poqday

## 2020-11-01 ENCOUNTER — Other Ambulatory Visit: Payer: Self-pay

## 2020-11-01 ENCOUNTER — Encounter: Payer: Self-pay | Admitting: Physical Medicine & Rehabilitation

## 2020-11-01 ENCOUNTER — Encounter: Payer: Medicare Other | Attending: Physical Medicine & Rehabilitation | Admitting: Physical Medicine & Rehabilitation

## 2020-11-01 VITALS — BP 130/74 | HR 66 | Temp 98.3°F | Ht 69.5 in | Wt 229.0 lb

## 2020-11-01 DIAGNOSIS — M5417 Radiculopathy, lumbosacral region: Secondary | ICD-10-CM | POA: Diagnosis not present

## 2020-11-01 DIAGNOSIS — G629 Polyneuropathy, unspecified: Secondary | ICD-10-CM | POA: Insufficient documentation

## 2020-11-01 DIAGNOSIS — G479 Sleep disorder, unspecified: Secondary | ICD-10-CM | POA: Insufficient documentation

## 2020-11-01 MED ORDER — AMITRIPTYLINE HCL 10 MG PO TABS: 5.0000 mg | ORAL_TABLET | Freq: Every day | ORAL | 1 refills | Status: DC

## 2020-11-01 MED ORDER — PREGABALIN 150 MG PO CAPS: 150.0000 mg | ORAL_CAPSULE | Freq: Two times a day (BID) | ORAL | 1 refills | Status: DC

## 2020-11-01 NOTE — Progress Notes (Addendum)
Subjective:    Patient ID: Robert Page, male    DOB: [birth year], 75 y.o.   MRN: [id number]  HPI Male with pmh/psh ofpolyneuropathy, atrial tachycardia, back surgery (?laminectomy L4-5), left foot surgery (tumor removal x4), dupuytens (palms and soles), HTN, GERD presents with b/l feet pain. Started summer 2000.  He had foot surgery in left foot in ~2016. Progressing.  Radiating proximally.  No symptoms in hands.  Ambulating and activity improves the pain. Shoes and inactivity exacerbate the pain. Hot and tingling/burning.  He notes I am the 6th doctor he's been to. He saw PCP and Ortho, who did MRI and told non-surgical.  He was referred to Rheum, who did not believe it was RA and thought it was L4-5 radic.  He was referred to Neurology, who ordered NCV/EMG (showing polyneuropathy, left peroneal neuropathy, Left L5/S1 radic) and MRI.  MRI C_spine showing mild disc bulges C3-6. He had MRI L-spine.  He saw Neurosurg, who did Myelogram and was told symptoms not originating in back.  Constant.  Associated numbness.  Gabapentin, Lyrica, Cymbalta did not help. He was told he has tried "every single medication for neuropathy" without benefit. He notes improvement when he sleeps in a different bed.  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 Pain does not limit activities.   Pain Inventory Average Pain 8 Pain Right Now 8 My pain is burning and tingling  In the last 24 hours, has pain interfered with the following? General activity 4 Relation with others 5 Enjoyment of life 4 What TIME of day is your pain at its worst? evening Sleep (in general) Fair  Pain is worse with: sitting and some activites Pain improves with: medication Relief from Meds: 2  walk without assistance how many minutes can you walk? all day ability to climb steps?  yes do you drive?  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 Hypertension Mother   . Macular degeneration Mother   . Kidney disease Mother   . Dementia Mother   . Hypertension Father   . Melanoma Sister   . Stroke Other        uncle  . Prostate cancer Other        uncle  . Colon cancer Other        grandmother/uncle  . Kidney disease Other        grandmother  . Liver cancer Other        uncle (mets)  . Heart disease Other        aunt/uncle  . Lung cancer Other        uncle  . Stomach cancer Cousin   . Heart disease Cousin   . Colon cancer Paternal Grandmother   . Colon cancer Paternal Uncle   . Prostate cancer Paternal Uncle   . Arthritis Brother   . Hypertension Brother   . Esophageal cancer Neg Hx   . Rectal cancer Neg Hx    Social History   Socioeconomic History  . Marital status: Married    Spouse name: Robert Page  . Number of children: 3  . Years of education: Not on file  . Highest education level: Not on file  Occupational History  . Occupation: Retired  Tobacco  Use  . Smoking status: Former Smoker    Types: Cigars    Quit date: 2015    Years since quitting: 7.1  . Smokeless tobacco: Never Used  . Tobacco comment: Pt states smokes occasional cigar.  This is typically < 1 time per month  Vaping Use  . Vaping Use: Never used  Substance and Sexual Activity  . Alcohol use: No    Alcohol/week: 0.0 standard drinks  . Drug use: No  . Sexual activity: Not on file  Other Topics Concern  . Not on file  Social History Narrative   Pt lives in [place].     Retired from [employer] and [employer].     Goes on mission trips to [place], gets regular exercise.   Caffeine use: very rare, Diet coke   Right handed    Social Determinants of Health   Financial Resource Strain: Low Risk   . Difficulty of Paying Living Expenses: Not hard at all  Food Insecurity: Not on file  Transportation Needs: Not on file   Physical Activity: Not on file  Stress: Not on file  Social Connections: Not on file   Past Surgical History:  Procedure Laterality Date  . BACK SURGERY     12/2013  . CARDIAC CATHETERIZATION  1990's  . COLECTOMY  2010   for diverticulitis  . COLON SURGERY N/A    [employer]rees[employer] 08/27/2020  . COLONOSCOPY  04/03/2013  . LASIK  2002   Bil  . POLYPECTOMY     Past Medical History:  Diagnosis Date  . Adenomatous polyp   . Allergy   . Atrial tachycardia ([place])   . Diverticulosis   . Dupuytren's disease    palms and soles  . GERD (gastroesophageal reflux disease)   . Hypertension    BP 130/74   Pulse 66   Temp 98.3 F (36.8 C)   Ht 5' 9.5" (1.765 m)   Wt 229 lb (103.9 kg)   SpO2 95%   BMI 33.33 kg/m   Opioid Risk Score:   Fall Risk Score:  `1  Depression screen PHQ 2/9  Depression screen [facility] 2/9 11/01/2020 08/30/2020 11/19/2017 11/06/2017 11/13/2016 03/14/2015 03/14/2015  Decreased Interest 0 0 0 0 0 0 0  Down, Depressed, Hopeless 0 0 0 0 0 0 0  PHQ - 2 Score 0 0 0 0 0 0 0  Altered sleeping 0 - 2 - 0 - -  Tired, decreased energy 1 - 2 - 0 - -  Change in appetite 0 - 0 - 0 - -  Feeling bad or failure about yourself  0 - 0 - 0 - -  Trouble concentrating 0 - 0 - 0 - -  Moving slowly or fidgety/restless 0 - 0 - 0 - -  Suicidal thoughts 0 - 0 - 0 - -  PHQ-9 Score 1 - 4 - 0 - -  Difficult doing work/chores - - Not difficult at all - Not difficult at all - -   Review of Systems  Constitutional: Negative.   HENT: Negative.   Eyes: Negative.   Respiratory: Negative.   Cardiovascular: Negative.   Gastrointestinal: Negative.   Endocrine: Negative.   Musculoskeletal: Positive for arthralgias, gait problem and myalgias.  Skin: Negative.   Allergic/Immunologic: Negative.   Neurological: Positive for weakness and numbness.       Tingling  Hematological: Negative.   Psychiatric/Behavioral: Negative.   All other systems reviewed and are negative.     Objective:   Physical  Exam Constitutional: No distress . Vital signs reviewed. HENT: Normocephalic.  Atraumatic. Eyes: EOMI. No discharge. Cardiovascular: No JVD.   Respiratory: Normal effort.  No stridor.   GI: Non-distended.   Skin: Warm and dry.   Varicose veins in b/l LE. Psych: Normal mood.  Normal behavior. Musc: No edema in extremities.  No tenderness in extremities. Neuro: Alert HOH Motor: B/l LE: 5/5 throughout Sensation diminished to light touch b/l LE Vibration diminished in b/l LE knees and distally, intact UE    Assessment & Plan:  Male with pmh/psh ofpolyneuropathy, atrial tachycardia, back surgery (?laminectomy L4-5), left foot surgery (tumor removal x4), dupuytens (palms and soles), HTN, GERD presents with b/l feet pain.  1. Chronic bilateral feet pain - Will order MRI  MRI foot report unremarkable.  MRI back report showing Radic  NCS/EMG - showing polyneuropathy and L5-SI radic on left  Labs reviewed  Chart/Referral information reviewed - b/l foot pain  PMAWARE reviewed  No benefit with Gabapentin, Cymbalta  Trial Heat  Will consider PT  Will consider TENS  Trial OTC Lidocaine patch  Will increase Lyrica to 150 BID  Will consider referral to Neurosurg  Will consider ESI  Will consider PNS  Will consider Capsacin  Will consider Vit B12/TSH/Vit D labs  Patient states main goal is to be pain free   2. Sleep disturbance  Decrease Elavil to 5mg   3. Morbid Obesity  Gaining weight, states he will lose some  Not interested in seeing dietitian

## 2020-11-01 NOTE — Patient Instructions (Signed)
Please trial:  1. Heat  2. Over the counter Lidocaine patch

## 2020-11-24 ENCOUNTER — Other Ambulatory Visit: Payer: Self-pay | Admitting: Family Medicine

## 2020-11-24 DIAGNOSIS — I10 Essential (primary) hypertension: Secondary | ICD-10-CM

## 2020-12-06 ENCOUNTER — Other Ambulatory Visit: Payer: Self-pay

## 2020-12-06 ENCOUNTER — Encounter: Payer: Medicare Other | Attending: Physical Medicine & Rehabilitation | Admitting: Physical Medicine & Rehabilitation

## 2020-12-06 ENCOUNTER — Encounter: Payer: Self-pay | Admitting: Physical Medicine & Rehabilitation

## 2020-12-06 VITALS — BP 145/79 | HR 71 | Temp 98.2°F | Ht 69.5 in | Wt 234.0 lb

## 2020-12-06 DIAGNOSIS — G629 Polyneuropathy, unspecified: Secondary | ICD-10-CM | POA: Insufficient documentation

## 2020-12-06 DIAGNOSIS — G479 Sleep disorder, unspecified: Secondary | ICD-10-CM | POA: Diagnosis not present

## 2020-12-06 DIAGNOSIS — M5417 Radiculopathy, lumbosacral region: Secondary | ICD-10-CM | POA: Diagnosis present

## 2020-12-06 MED ORDER — PREGABALIN 150 MG PO CAPS: 150.0000 mg | ORAL_CAPSULE | Freq: Two times a day (BID) | ORAL | 1 refills | Status: DC

## 2020-12-06 MED ORDER — CAPSAICIN 0.1 % EX CREA: TOPICAL_CREAM | CUTANEOUS | 1 refills | Status: DC

## 2020-12-06 NOTE — Progress Notes (Addendum)
Subjective:    Patient ID: Robert Page, male    DOB: Nov 25, 1945, 75 y.o.   MRN: [id number]  HPI Male with pmh/psh ofpolyneuropathy, atrial tachycardia, back surgery (?laminectomy L4-5), left foot surgery (tumor removal x4), dupuytens (palms and soles), HTN, GERD presents with b/l feet pain.  Initially stated: Started summer 2000.  He had foot surgery in left foot in ~2016. Progressing.  Radiating proximally.  No symptoms in hands.  Ambulating and activity improves the pain. Shoes and inactivity exacerbate the pain. Hot and tingling/burning.  He notes I am the 6th doctor he's been to. He saw PCP and Ortho, who did MRI and told non-surgical.  He was referred to Rheum, who did not believe it was RA and thought it was L4-5 radic.  He was referred to Neurology, who ordered NCV/EMG (showing polyneuropathy, left peroneal neuropathy, Left L5/S1 radic) and MRI.  MRI C-spine showing mild disc bulges C3-6. He had MRI L-spine.  He saw Neurosurg, who did Myelogram and was told symptoms not originating in back.  Constant.  Associated numbness.  Gabapentin, Lyrica, Cymbalta did not help. He was told he has tried "every single medication for neuropathy" without benefit. He notes improvement when he sleeps in a different bed.  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 Pain does not limit activities.   Last clinic visit on 10/24/2020.  Since that time, patient states he did not try heat.  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 He has some benefit with Lidocaine patch. He notes improvement with Lyrica. Sleep is good. He states he is gaining weight, as opposed to losing weight.  He states he cut out soft drinks.   Pain Inventory Average Pain 5 Pain Right Now 5 My pain is burning, dull and tighness in feet.   In the last 24 hours, has pain interfered with the following? General  activity 2 Relation with others 2 Enjoyment of life 7 What TIME of day is your pain at its worst? daytime Sleep (in general) Good  Pain is worse with: inactivity and some activites Pain improves with: medication Relief from Meds: good   Family History  Problem Relation Age of Onset  . Hypertension Mother   . Macular degeneration Mother   . Kidney disease Mother   . Dementia Mother   . Hypertension Father   . Melanoma Sister   . Stroke Other        uncle  . Prostate cancer Other        uncle  . Colon cancer Other        grandmother/uncle  . Kidney disease Other        grandmother  . Liver cancer Other        uncle (mets)  . Heart disease Other        aunt/uncle  . Lung cancer Other        uncle  . Stomach cancer Cousin   . Heart disease Cousin   . Colon cancer Paternal Grandmother   . Colon cancer Paternal Uncle   . Prostate cancer Paternal Uncle   . Arthritis Brother   . Hypertension Brother   . Esophageal cancer Neg Hx   . Rectal cancer Neg Hx    Social History   Socioeconomic History  . Marital status: Married    Spouse  name: Robert Page  . Number of children: 3  . Years of education: Not on file  . Highest education level: Not on file  Occupational History  . Occupation: Retired  Tobacco Use  . Smoking status: Former Smoker    Types: Cigars    Quit date: 2015    Years since quitting: 7.2  . Smokeless tobacco: Never Used  . Tobacco comment: Pt states smokes occasional cigar.  This is typically < 1 time per month  Vaping Use  . Vaping Use: Never used  Substance and Sexual Activity  . Alcohol use: No    Alcohol/week: 0.0 standard drinks  . Drug use: No  . Sexual activity: Not on file  Other Topics Concern  . Not on file  Social History Narrative   Pt lives in [place].     Retired from [employer] and [employer].     Goes on mission trips to [place], gets regular exercise.   Caffeine use: very rare, Diet coke   Right handed    Social  Determinants of Health   Financial Resource Strain: Low Risk   . Difficulty of Paying Living Expenses: Not hard at all  Food Insecurity: Not on file  Transportation Needs: Not on file  Physical Activity: Not on file  Stress: Not on file  Social Connections: Not on file   Past Surgical History:  Procedure Laterality Date  . BACK SURGERY     12/2013  . CARDIAC CATHETERIZATION  1990's  . COLECTOMY  2010   for diverticulitis  . COLON SURGERY N/A    [facility] 08/27/2020  . COLONOSCOPY  04/03/2013  . LASIK  2002   Bil  . POLYPECTOMY     Past Medical History:  Diagnosis Date  . Adenomatous polyp   . Allergy   . Atrial tachycardia ([place])   . Diverticulosis   . Dupuytren's disease    palms and soles  . GERD (gastroesophageal reflux disease)   . Hypertension    BP (!) 145/79   Pulse 71   Temp 98.2 F (36.8 C)   Ht 5' 9.5" (1.765 m)   Wt 234 lb (106.1 kg)   SpO2 94%   BMI 34.06 kg/m   Opioid Risk Score:   Fall Risk Score:  `1  Depression screen PHQ 2/9  Depression screen [facility] 2/9 12/06/2020 11/01/2020 08/30/2020 11/19/2017 11/06/2017 11/13/2016 03/14/2015  Decreased Interest 0 0 0 0 0 0 0  Down, Depressed, Hopeless 0 0 0 0 0 0 0  PHQ - 2 Score 0 0 0 0 0 0 0  Altered sleeping - 0 - 2 - 0 -  Tired, decreased energy - 1 - 2 - 0 -  Change in appetite - 0 - 0 - 0 -  Feeling bad or failure about yourself  - 0 - 0 - 0 -  Trouble concentrating - 0 - 0 - 0 -  Moving slowly or fidgety/restless - 0 - 0 - 0 -  Suicidal thoughts - 0 - 0 - 0 -  PHQ-9 Score - 1 - 4 - 0 -  Difficult doing work/chores - - - Not difficult at all - Not difficult at all -   Review of Systems  Constitutional: Negative.   HENT: Negative.   Eyes: Negative.   Respiratory: Negative.   Cardiovascular: Negative.   Gastrointestinal: Negative.   Endocrine: Negative.   Musculoskeletal: Positive for arthralgias, gait problem and myalgias.       Stiffness in both feet, pain  in right leg  Skin: Negative.    Allergic/Immunologic: Negative.   Neurological: Positive for weakness and numbness.       Tingling  Hematological: Negative.   Psychiatric/Behavioral: Negative.   All other systems reviewed and are negative.     Objective:   Physical Exam  Constitutional: No distress . Vital signs reviewed. HENT: Normocephalic.  Atraumatic. Eyes: EOMI. No discharge. Cardiovascular: No JVD.   Respiratory: Normal effort.  No stridor.   GI: Non-distended.   Skin: Warm and dry.  Intact. Varicose veins in b/l LE. Psych: Normal mood.  Normal behavior. Musc: No edema in extremities.  No tenderness in extremities. Neuro: Alert HOH Motor: B/l LE: 5/5 throughout Sensation diminished to light touch b/l LE Vibration diminished in b/l LE knees and distally, intact UE    Assessment & Plan:  Male with pmh/psh ofpolyneuropathy, atrial tachycardia, back surgery (?laminectomy L4-5), left foot surgery (tumor removal x4), dupuytens (palms and soles), HTN, GERD presents with b/l feet pain.  1. Chronic bilateral feet pain -   MRI foot report unremarkable.  MRI back report showing ? L5-S1 radic  NCS/EMG - showing polyneuropathy and L5-SI radic on left  No benefit with Gabapentin  Trial Heat, reminded   Will consider PT  Will consider TENS  Continue OTC Lidocaine patch  Continue Lyrica to 150 BID  Will consider referral to Neurosurg  Will consider ESI  Will consider PNS  Will order Capsacin  Will order Vit B12/TSH/Vit D labs  Patient states he will be lifting a lot over the next few weeks, encouraged to pace himself  Patient states main goal is to be pain free - improving   2. Sleep disturbance  Decreased Elavil to 5mg , educated on signs/symptoms of serotonin syndrome   Improving  3. Morbid Obesity  Gaining weight  Not interested in seeing dietitian

## 2020-12-07 LAB — TSH: TSH: 0.65 u[IU]/mL (ref 0.450–4.500)

## 2020-12-07 LAB — VITAMIN D 25 HYDROXY (VIT D DEFICIENCY, FRACTURES): Vit D, 25-Hydroxy: 29.8 ng/mL — ABNORMAL LOW (ref 30.0–100.0)

## 2020-12-07 LAB — VITAMIN B12: Vitamin B-12: 429 pg/mL (ref 232–1245)

## 2020-12-08 ENCOUNTER — Other Ambulatory Visit: Payer: Medicare Other | Admitting: *Deleted

## 2020-12-08 DIAGNOSIS — I471 Supraventricular tachycardia: Secondary | ICD-10-CM

## 2020-12-08 DIAGNOSIS — I6523 Occlusion and stenosis of bilateral carotid arteries: Secondary | ICD-10-CM

## 2020-12-08 DIAGNOSIS — I2510 Atherosclerotic heart disease of native coronary artery without angina pectoris: Secondary | ICD-10-CM

## 2020-12-08 LAB — LIPID PANEL
Chol/HDL Ratio: 4.3 ratio (ref 0.0–5.0)
Cholesterol, Total: 197 mg/dL (ref 100–199)
HDL: 46 mg/dL (ref >39)
LDL Chol Calc (NIH): 130 mg/dL — ABNORMAL HIGH (ref 0–99)
Triglycerides: 114 mg/dL (ref 0–149)
VLDL Cholesterol Cal: 21 mg/dL (ref 5–40)

## 2020-12-13 ENCOUNTER — Telehealth: Payer: Self-pay | Admitting: *Deleted

## 2020-12-13 DIAGNOSIS — E785 Hyperlipidemia, unspecified: Secondary | ICD-10-CM

## 2020-12-13 NOTE — Telephone Encounter (Signed)
Left message to call office

## 2020-12-13 NOTE — Telephone Encounter (Signed)
-----   Message from Jettie Booze, MD sent at 12/12/2020  3:05 PM EDT ----- LDL still high.  WOuld start Crestor 10 mg daily. Recheck liver and lipids in 3 months

## 2020-12-14 MED ORDER — ROSUVASTATIN CALCIUM 10 MG PO TABS: 10.0000 mg | ORAL_TABLET | Freq: Every day | ORAL | 3 refills | Status: DC

## 2020-12-14 NOTE — Telephone Encounter (Signed)
Robert Page is returning a call to [name]  from yesterday evening. Please Advise

## 2020-12-14 NOTE — Telephone Encounter (Signed)
Patient notified.  He is agreeable to starting medication.  Prescription sent to [facility] on [address].  He will come in for fasting lab work on July 14,2022.

## 2020-12-31 ENCOUNTER — Encounter: Payer: Self-pay | Admitting: Nurse Practitioner

## 2020-12-31 ENCOUNTER — Other Ambulatory Visit: Payer: Self-pay

## 2020-12-31 ENCOUNTER — Ambulatory Visit (INDEPENDENT_AMBULATORY_CARE_PROVIDER_SITE_OTHER): Payer: 59 | Admitting: Nurse Practitioner

## 2020-12-31 VITALS — BP 150/80 | HR 61 | Temp 98.4°F | Ht 69.6 in | Wt 238.6 lb

## 2020-12-31 DIAGNOSIS — S61412A Laceration without foreign body of left hand, initial encounter: Secondary | ICD-10-CM

## 2020-12-31 DIAGNOSIS — L03011 Cellulitis of right finger: Secondary | ICD-10-CM

## 2020-12-31 NOTE — Progress Notes (Signed)
Subjective:    Patient ID: Robert Page, male    DOB: 1945/10/09, 75 y.o.   MRN: [id number]  HPI: Robert Page is a 75 y.o. male presenting for skin injury.  Chief Complaint  Patient presents with  . 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 Injury with a nail on the right thumb. Left had hit something and ripped skin   HANG NAIL Pulled at hang nail Saturday; reports there is pain there now; worse when he hits it.  Reports there is a " piece of meat" in between his skin and nail that he would like removed. Duration: days Location: Right thumb History of trauma in area: yes Pain: yes Quality: Sharp Severity: Moderate Redness: yes Swelling: no Oozing: no Pus: no Fevers: no Nausea/vomiting: no Status: stable Treatments attempted: Keeping covered Tetanus: UTD   SKIN TEAR Patient also reports skin tear that he suffered yesterday on his left posterior hand.  Immediately after, he move the skin back over the wound and covered with Neosporin and a bandage.  He has been keeping it covered with Neosporin and a bandage and denies drainage, bruising, redness, swelling, or pain. Duration: days Location: Left hand History of trauma in area: yes Pain: no Quality: n/a Severity: n/a Redness: no Swelling: no Oozing: no Pus: no Fevers: no Nausea/vomiting: no Status: Better Treatments attempted: Topical triple antibiotic ointment and Band-Aid  Allergies  Allergen Reactions  . Other     Other reaction(s): Unknown  . Ibuprofen Other (See Comments)    stomach cramps  Other reaction(s): Unknown    Outpatient Encounter Medications as of 12/31/2020  Medication Sig  . amitriptyline (ELAVIL) 10 MG tablet Take 0.5 tablets (5 mg total) by mouth at bedtime.  Marland Kitchen amLODipine (NORVASC) 5 MG tablet Take by mouth.  Marland Kitchen aspirin EC 81 MG tablet Take 81 mg by mouth daily.  . Capsaicin 0.1 % CREA Apply twice a day to feet  . DULoxetine (CYMBALTA) 60 MG  capsule TAKE 1 CAPSULE BY MOUTH EVERY DAY  . fexofenadine (ALLEGRA) 180 MG tablet Take 180 mg by mouth daily.  . fluticasone (FLONASE) 50 MCG/ACT nasal spray SPRAY 2 SPRAYS INTO EACH NOSTRIL EVERY DAY  . hydrochlorothiazide (HYDRODIURIL) 25 MG tablet Take 1 tablet (25 mg total) by mouth daily.  Marland Kitchen omeprazole (PRILOSEC) 20 MG capsule Take 20 mg by mouth daily.  . pregabalin (LYRICA) 150 MG capsule Take 1 capsule (150 mg total) by mouth 2 (two) times daily.  . rosuvastatin (CRESTOR) 10 MG tablet Take 1 tablet (10 mg total) by mouth daily.  . valsartan (DIOVAN) 160 MG tablet TAKE 1 TABLET BY MOUTH EVERY DAY   No facility-administered encounter medications on file as of 12/31/2020.    Patient Active Problem List   Diagnosis Date Noted  . Morbid obesity ([place]) 12/06/2020  . Lumbosacral radiculopathy 11/01/2020  . Sleep disturbance 11/01/2020  . Polyneuropathy 09/24/2019  . Dysesthesia 09/24/2019  . Left foot pain 09/24/2019  . Chest pain, atypical 11/27/2017  . Thyroid nodule 10/25/2015  . Cigarette smoker 04/20/2015  . Essential hypertension 03/14/2015  . Cough 03/14/2015  . Multiple pulmonary nodules 03/14/2015  . Lower respiratory tract infection 03/14/2015  . SVT (supraventricular tachycardia) ([place]) 11/03/2012  . Fatigue 11/03/2012  . GERD (gastroesophageal reflux disease) 10/24/2012  . Plantar fasciitis 12/21/2010  . Routine general medical examination at a health care facility 12/16/2010  . Prostate cancer screening 12/16/2010  .  SHOULDER PAIN, LEFT 11/26/2009  . HAND PAIN, BILATERAL 11/26/2009  . PERSONAL HX COLONIC POLYPS 10/26/2008  . EXTERNAL HEMORRHOIDS 09/23/2008  . HYPERTENSION 01/01/2007  . Allergic rhinitis 01/01/2007  . DIVERTICULOSIS, COLON 01/01/2007  . DIVERTICULITIS, HX OF 01/01/2007    Past Medical History:  Diagnosis Date  . Adenomatous polyp   . Allergy   . Atrial tachycardia ([place])   . Diverticulosis   . Dupuytren's disease    palms and soles  . GERD  (gastroesophageal reflux disease)   . Hypertension     Relevant past medical, surgical, family and social history reviewed and updated as indicated. Interim medical history since our last visit reviewed.  Review of Systems  Per HPI unless specifically indicated above     Objective:    BP (!) 150/80   Pulse 61   Temp 98.4 F (36.9 C)   Ht 5' 9.6" (1.768 m)   Wt 238 lb 9.6 oz (108.2 kg)   SpO2 97%   BMI 34.64 kg/m   Wt Readings from Last 3 Encounters:  12/31/20 238 lb 9.6 oz (108.2 kg)  12/06/20 234 lb (106.1 kg)  11/01/20 229 lb (103.9 kg)    Physical Exam Vitals and nursing note reviewed.  Constitutional:      General: He is not in acute distress.    Appearance: Normal appearance. He is not toxic-appearing.  Skin:    General: Skin is warm and dry.     Capillary Refill: Capillary refill takes less than 2 seconds.     Coloration: Skin is not pale.     Findings: Lesion present.       Neurological:     Mental Status: He is alert and oriented to person, place, and time.     Gait: Gait normal.  Psychiatric:        Mood and Affect: Mood normal.        Behavior: Behavior normal.        Thought Content: Thought content normal.        Judgment: Judgment normal.       Assessment & Plan:  1. Skin tear of left hand without complication, initial encounter Acute.  Appears to be healing well.  No signs or symptoms of infection today.  Continue wound care with topical Neosporin and keeping covered.  Return to clinic if signs or symptoms of infection develop-redness, swelling, oozing, drainage, pain.  2. Paronychia of right thumb Acute.  I&D performed-see procedure note below.  Discussed wound care and signs or symptoms of infection to return to clinic with.  Procedure- Incision and Drainage Procedure explained to patient questions answered benefits and risks discussed written consent obtained. Antiseptic-Betadine Anesthesia-lidocaine 0.2 cm incision made laterally; small  amount of blood expressed.  Small amount of hypertrophic skin tissue removed from nail bed. Minimal blood loss -less than 10 cc Hemostasis achieved using silver nitrate Patient tolerated procedure well Bandage applied      Follow up plan: Return if symptoms worsen or fail to improve.

## 2021-01-06 ENCOUNTER — Telehealth: Payer: Self-pay | Admitting: Pharmacist

## 2021-01-06 NOTE — Progress Notes (Addendum)
    Chronic Care Management Pharmacy Assistant   Name: Robert Page  MRN: [id number] DOB: May 05, 1946   Reason for Encounter: General Disease State Call   Conditions to be addressed/monitored: hypertension, allregic rhinitis, GERD  Recent office visits:  12/31/20 Eulogio Bear, NP. For Skin teat of left hand/thumb. No medication changes. 10/29/20 (Telephone) Dr. [name]. Losartan was DISCOUNTED. STARTED Valsartan 160 mg 1 tablet daily.   Recent consult visits:  12/13/20 Cardiology (Telephone) STARTED Rosuvastatin Calcium 10 mg daily.  12/06/20 Phys Med Jamse Arn, MD. For Follow-Up. STARTED Capsaicin 0.1% apply twice a day to feet. 11/01/20 Phys Med Jamse Arn, MD. New patient appointment. DECREASED Amitriptyline to 5 mg at bedtime. INCREASED Pregabalin to 150 mg 2 times daily.  10/25/20 Otolaryngology Wilburn Cornelia Raylene Miyamoto, MD  For Swallowing concerns. No medication changes.   Hospital visits:  None since 10/01/20  Medications: Outpatient Encounter Medications as of 01/06/2021  Medication Sig   amitriptyline (ELAVIL) 10 MG tablet Take 0.5 tablets (5 mg total) by mouth at bedtime.   amLODipine (NORVASC) 5 MG tablet Take by mouth.   aspirin EC 81 MG tablet Take 81 mg by mouth daily.   Capsaicin 0.1 % CREA Apply twice a day to feet   DULoxetine (CYMBALTA) 60 MG capsule TAKE 1 CAPSULE BY MOUTH EVERY DAY   fexofenadine (ALLEGRA) 180 MG tablet Take 180 mg by mouth daily.   fluticasone (FLONASE) 50 MCG/ACT nasal spray SPRAY 2 SPRAYS INTO EACH NOSTRIL EVERY DAY   hydrochlorothiazide (HYDRODIURIL) 25 MG tablet Take 1 tablet (25 mg total) by mouth daily.   omeprazole (PRILOSEC) 20 MG capsule Take 20 mg by mouth daily.   pregabalin (LYRICA) 150 MG capsule Take 1 capsule (150 mg total) by mouth 2 (two) times daily.   rosuvastatin (CRESTOR) 10 MG tablet Take 1 tablet (10 mg total) by mouth daily.   valsartan (DIOVAN) 160 MG tablet TAKE 1 TABLET BY MOUTH EVERY DAY   No  facility-administered encounter medications on file as of 01/06/2021.    GEN CALL:   Patient stated he has tried every medication that is available and he's seen so many doctors but nothing seems to help 100% He stated he knows what causes his neuropathy and its his back. He stated he does a lot of twisting and lifting for work. He stated he is taking Gabapentin, Cymbalta, and Lyric but he does not see a difference. Patient stated he is changing his eating habits and he is drinking more water. Patient stated he is currently trying to loose weight because he knows that his weight is a factor in his foot pain. Scheduled appointment on 03/03/21 9 am for Leata Mouse, [facility].    Star Rating Drugs: Pregabalin 150 mg 45 DS 12/24/20 , Rosuvastatin  10 mg 90 12/14/20 Valsartan 160 mg 30 DS 11/24/20  Follow-Up:Pharmacist Review  Charlann Lange, RMA Clinical Pharmacist Assistant [phone]  6 minutes spent in review, coordination, and documentation.  Reviewed by: Beverly Milch, PharmD Clinical Pharmacist [facility] [phone]

## 2021-01-27 ENCOUNTER — Encounter: Payer: Medicare Other | Attending: Physical Medicine & Rehabilitation | Admitting: Physical Medicine & Rehabilitation

## 2021-01-27 ENCOUNTER — Other Ambulatory Visit: Payer: Self-pay

## 2021-01-27 ENCOUNTER — Encounter: Payer: Self-pay | Admitting: Physical Medicine & Rehabilitation

## 2021-01-27 VITALS — BP 131/75 | HR 64 | Temp 98.1°F | Ht 69.5 in | Wt 233.0 lb

## 2021-01-27 DIAGNOSIS — M5417 Radiculopathy, lumbosacral region: Secondary | ICD-10-CM

## 2021-01-27 DIAGNOSIS — G479 Sleep disorder, unspecified: Secondary | ICD-10-CM

## 2021-01-27 DIAGNOSIS — G629 Polyneuropathy, unspecified: Secondary | ICD-10-CM | POA: Diagnosis present

## 2021-01-27 MED ORDER — PREGABALIN 150 MG PO CAPS: 150.0000 mg | ORAL_CAPSULE | Freq: Two times a day (BID) | ORAL | 2 refills | Status: DC

## 2021-01-27 MED ORDER — METHOCARBAMOL 500 MG PO TABS: 500.0000 mg | ORAL_TABLET | Freq: Three times a day (TID) | ORAL | 3 refills | Status: DC | PRN

## 2021-01-27 NOTE — Progress Notes (Addendum)
Subjective:    Patient ID: Robert Page, male    DOB: [birth year], 75 y.o.   MRN: [id number]  HPI Male with pmh/psh ofpolyneuropathy, atrial tachycardia, back surgery (?laminectomy L4-5), left foot surgery (tumor removal x4), dupuytens (palms and soles), HTN, GERD presents with b/l feet pain.  Initially stated: Started summer 2000.  He had foot surgery in left foot in ~2016. Progressing.  Radiating proximally.  No symptoms in hands.  Ambulating and activity improves the pain. Shoes and inactivity exacerbate the pain. Hot and tingling/burning.  He notes I am the 6th doctor he's been to. He saw PCP and Ortho, who did MRI and told non-surgical.  He was referred to Rheum, who did not believe it was RA and thought it was L4-5 radic.  He was referred to Neurology, who ordered NCV/EMG (showing polyneuropathy, left peroneal neuropathy, Left L5/S1 radic) and MRI.  MRI C-spine showing mild disc bulges C3-6. He had MRI L-spine.  He saw Neurosurg, who did Myelogram and was told symptoms not originating in back.  Constant.  Associated numbness.  Gabapentin, Lyrica, Cymbalta did not help. He was told he has tried "every single medication for neuropathy" without benefit. He notes improvement when he sleeps in a different bed.  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 Pain does not limit activities.   Last clinic visit on 12/06/20.  Since that time, patient states he has not tried heat. He states he had burning with Capsacin. He obtained Vitamin levels. Sleep has improved.  Patient states he was losing weight, but gained in on vacation. Patient states he often does not have time to take care of his body, take meds consistently, etc.   Pain Inventory Average Pain 5 Pain Right Now 4 My pain is sharp and tighness in feet.   In the last 24 hours, has pain interfered with the following? General activity 4 Relation with others  5 Enjoyment of life 6 What TIME of day is your pain at its worst? morning , evening and night Sleep (in general) Good  Pain is worse with: bending, sitting and inactivity Pain improves with: medication and injections Relief from Meds: 1   Family History  Problem Relation Age of Onset  . Hypertension Mother   . Macular degeneration Mother   . Kidney disease Mother   . Dementia Mother   . Hypertension Father   . Melanoma Sister   . Stroke Other        uncle  . Prostate cancer Other        uncle  . Colon cancer Other        grandmother/uncle  . Kidney disease Other        grandmother  . Liver cancer Other        uncle (mets)  . Heart disease Other        aunt/uncle  . Lung cancer Other        uncle  . Stomach cancer Cousin   . Heart disease Cousin   . Colon cancer Paternal Grandmother   . Colon cancer Paternal Uncle   . Prostate cancer Paternal Uncle   . Arthritis Brother   . Hypertension Brother   . Esophageal cancer Neg Hx   . Rectal cancer Neg Hx    Social History   Socioeconomic History  . Marital status: Married    Spouse name: Lorne Winkels  .  Number of children: 3  . Years of education: Not on file  . Highest education level: Not on file  Occupational History  . Occupation: Retired  Tobacco Use  . Smoking status: Former Smoker    Types: Cigars    Quit date: 2015    Years since quitting: 7.4  . Smokeless tobacco: Never Used  . Tobacco comment: Pt states smokes occasional cigar.  This is typically < 1 time per month  Vaping Use  . Vaping Use: Never used  Substance and Sexual Activity  . Alcohol use: No    Alcohol/week: 0.0 standard drinks  . Drug use: No  . Sexual activity: Not on file  Other Topics Concern  . Not on file  Social History Narrative   Pt lives in [place].     Retired from [employer] and [employer].     Goes on mission trips to [place], gets regular exercise.   Caffeine use: very rare, Diet coke   Right handed    Social  Determinants of Health   Financial Resource Strain: Not on file  Food Insecurity: Not on file  Transportation Needs: Not on file  Physical Activity: Not on file  Stress: Not on file  Social Connections: Not on file   Past Surgical History:  Procedure Laterality Date  . BACK SURGERY     12/2013  . CARDIAC CATHETERIZATION  1990's  . COLECTOMY  2010   for diverticulitis  . COLON SURGERY N/A    [employer]rees[employer] 08/27/2020  . COLONOSCOPY  04/03/2013  . LASIK  2002   [place]  . POLYPECTOMY     Past Medical History:  Diagnosis Date  . Adenomatous polyp   . Allergy   . Atrial tachycardia ([place])   . Diverticulosis   . Dupuytren's disease    palms and soles  . GERD (gastroesophageal reflux disease)   . Hypertension    BP 131/75   Pulse 64   Temp 98.1 F (36.7 C)   Ht 5' 9.5" (1.765 m)   Wt 233 lb (105.7 kg)   SpO2 94%   BMI 33.91 kg/m   Opioid Risk Score:   Fall Risk Score:  `1  Depression screen PHQ 2/9  Depression screen [facility] 2/9 12/06/2020 11/01/2020 08/30/2020 11/19/2017 11/06/2017 11/13/2016 03/14/2015  Decreased Interest 0 0 0 0 0 0 0  Down, Depressed, Hopeless 0 0 0 0 0 0 0  PHQ - 2 Score 0 0 0 0 0 0 0  Altered sleeping - 0 - 2 - 0 -  Tired, decreased energy - 1 - 2 - 0 -  Change in appetite - 0 - 0 - 0 -  Feeling bad or failure about yourself  - 0 - 0 - 0 -  Trouble concentrating - 0 - 0 - 0 -  Moving slowly or fidgety/restless - 0 - 0 - 0 -  Suicidal thoughts - 0 - 0 - 0 -  PHQ-9 Score - 1 - 4 - 0 -  Difficult doing work/chores - - - Not difficult at all - Not difficult at all -   Review of Systems  Constitutional: Negative.   HENT: Negative.   Eyes: Negative.   Respiratory: Negative.   Cardiovascular: Negative.   Gastrointestinal: Negative.   Endocrine: Negative.   Musculoskeletal: Positive for arthralgias, gait problem and myalgias.       Stiffness in both feet, pain in right leg  Skin: Negative.   Allergic/Immunologic: Negative.   Neurological: Positive for  weakness  and numbness.       Tingling  Hematological: Negative.   Psychiatric/Behavioral: Negative.   All other systems reviewed and are negative.     Objective:   Physical Exam  Constitutional: No distress . Vital signs reviewed. HENT: Normocephalic.  Atraumatic. Eyes: EOMI. No discharge. Cardiovascular: No JVD.   Respiratory: Normal effort.  No stridor.   GI: Non-distended.   Skin: Warm and dry.   Varicose veins in b/l LE Psych: Normal mood.  Normal behavior. Musc: No edema in extremities.  No tenderness in extremities. Neuro: Alert HOH Motor: B/l LE: 5/5 throughout Sensation diminished to light touch b/l LE Vibration diminished in b/l LE knees and distally, intact UE (previously)    Assessment & Plan:  Male with pmh/psh ofpolyneuropathy, atrial tachycardia, back surgery (?laminectomy L4-5), left foot surgery (tumor removal x4), dupuytens (palms and soles), HTN, GERD presents with b/l feet pain.  1. Chronic bilateral feet pain -   MRI foot report unremarkable.  MRI back report showing ? L5-S1 radic  NCS/EMG - showing polyneuropathy and L5-SI radic on left  No benefit with Gabapentin  Trial Heat, reminded again  Will consider PT  Will consider TENS  Trial OTC Lidocaine patch  Continue Lyrica 150 TID  Will consider referral to Neurosurg, pt would like to hold off at present  Will consider ESI, pt would like to hold off at present  Will consider PNS  Retry Capsacin, educated again  Patient states main goal is to be pain free - improving  Educated on importance of caring for self.   2. Sleep disturbance  Continue Elavil to 5mg , educated on signs/symptoms of serotonin syndrome   Improving  3. Morbid Obesity  Gaining weight  Not interested in seeing dietitian  Educated again  4. Myalgia  Robaxin 500 TID PRN

## 2021-02-07 ENCOUNTER — Ambulatory Visit: Payer: Medicare Other | Admitting: Physical Medicine & Rehabilitation

## 2021-02-09 ENCOUNTER — Other Ambulatory Visit: Payer: Self-pay | Admitting: Family Medicine

## 2021-02-09 DIAGNOSIS — I10 Essential (primary) hypertension: Secondary | ICD-10-CM

## 2021-02-13 ENCOUNTER — Other Ambulatory Visit: Payer: Self-pay | Admitting: Physical Medicine & Rehabilitation

## 2021-02-14 ENCOUNTER — Other Ambulatory Visit: Payer: Self-pay | Admitting: *Deleted

## 2021-02-14 MED ORDER — DULOXETINE HCL 60 MG PO CPEP: ORAL_CAPSULE | ORAL | 3 refills | Status: DC

## 2021-02-21 NOTE — Progress Notes (Signed)
Chronic Care Management Pharmacy Note  03/03/2021 Name:  Robert Page MRN:  [id number] DOB:  [birth year]  Summary: PharmD follow up for HLD and neuropathy.  Taking statin daily with no adverse effects.  Plans to have labs at Dr. Irish Lack next month.  Neuropathy comes and goes, pain is currently tolerable.  Recommendations/Changes made from today's visit: No recommendations   Plan: Follow up after labs with Irish Lack   Subjective: Robert Page is an 75 y.o. year old male who is a primary patient of [name], [name], MD.  The CCM team was consulted for assistance with disease management and care coordination needs.    Engaged with patient by telephone for follow up visit in response to provider referral for pharmacy case management and/or care coordination services.   Consent to Services:  The patient was given the following information about Chronic Care Management services today, agreed to services, and gave verbal consent: 1. CCM service includes personalized support from designated clinical staff supervised by the primary care provider, including individualized plan of care and coordination with other care providers 2. 24/7 contact phone numbers for assistance for urgent and routine care needs. 3. Service will only be billed when office clinical staff spend 20 minutes or more in a month to coordinate care. 4. Only one practitioner may furnish and bill the service in a calendar month. 5.The patient may stop CCM services at any time (effective at the end of the month) by phone call to the office staff. 6. The patient will be responsible for cost sharing (co-pay) of up to 20% of the service fee (after annual deductible is met). Patient agreed to services and consent obtained.  Patient Care Team: Susy Frizzle, MD as PCP - General (Family Medicine) Jettie Booze, MD as PCP - Cardiology (Cardiology) Thompson Grayer, MD as Attending Physician (Cardiology) Edythe Clarity, [facility]  (Pharmacist)  Recent office visits: 02/24/21 [name]) - started on prednisone taper for sciatica and will consider MRI in the future if pain is worsening.  Recent consult visits: 01/27/21 Posey Pronto) - capsaicin restarted, goal is for him to be pain free.  Hospital visits: None in previous 6 months   Objective:  Lab Results  Component Value Date   CREATININE 1.07 02/24/2021   BUN 23 02/24/2021   GFR 74.13 12/19/2010   GFRNONAA 68 02/24/2021   GFRAA 79 02/24/2021   NA 142 02/24/2021   K 4.4 02/24/2021   CALCIUM 9.4 02/24/2021   CO2 30 02/24/2021   GLUCOSE 122 (H) 02/24/2021    Lab Results  Component Value Date/Time   HGBA1C 5.9 (H) 02/24/2021 11:28 AM   HGBA1C CANCELED 03/17/2019 02:40 PM   GFR 74.13 12/19/2010 08:44 AM    Last diabetic Eye exam: No results found for: HMDIABEYEEXA  Last diabetic Foot exam: No results found for: HMDIABFOOTEX   Lab Results  Component Value Date   CHOL 197 12/08/2020   HDL 46 12/08/2020   LDLCALC 130 (H) 12/08/2020   TRIG 114 12/08/2020   CHOLHDL 4.3 12/08/2020    Hepatic Function Latest Ref Rng & Units 02/24/2021 08/30/2020 09/24/2019  Total Protein 6.1 - 8.1 g/dL 6.5 7.0 7.1  Albumin 3.6 - 5.1 g/dL - - -  AST 10 - 35 U/L 20 16 -  ALT 9 - 46 U/L 22 19 -  Alk Phosphatase 40 - 115 U/L - - -  Total Bilirubin 0.2 - 1.2 mg/dL 0.4 0.4 -  Bilirubin, Direct 0.0 - 0.3 mg/dL - - -  Lab Results  Component Value Date/Time   TSH 0.650 12/06/2020 10:00 AM   TSH 0.68 03/17/2019 02:40 PM    CBC Latest Ref Rng & Units 02/24/2021 08/30/2020 10/29/2017  WBC 3.8 - 10.8 Thousand/uL 6.3 7.8 8.0  Hemoglobin 13.2 - 17.1 g/dL 15.1 16.3 16.0  Hematocrit 38.5 - 50.0 % 44.7 47.2 47.3  Platelets 140 - 400 Thousand/uL 256 275 269    Lab Results  Component Value Date/Time   VD25OH 29.8 (L) 12/06/2020 10:00 AM    Clinical ASCVD: No  The 10-year ASCVD risk score Mikey Bussing DC Jr., et al., 2013) is: 28.9%   Values used to calculate the score:     Age:  35 years     Sex: Male     Is Non-Hispanic African American: No     Diabetic: No     Tobacco smoker: No     Systolic Blood Pressure: 825 mmHg     Is BP treated: Yes     HDL Cholesterol: 46 mg/dL     Total Cholesterol: 197 mg/dL    Depression screen [facility] 2/9 12/06/2020 11/01/2020 08/30/2020  Decreased Interest 0 0 0  Down, Depressed, Hopeless 0 0 0  PHQ - 2 Score 0 0 0  Altered sleeping - 0 -  Tired, decreased energy - 1 -  Change in appetite - 0 -  Feeling bad or failure about yourself  - 0 -  Trouble concentrating - 0 -  Moving slowly or fidgety/restless - 0 -  Suicidal thoughts - 0 -  PHQ-9 Score - 1 -  Difficult doing work/chores - - -     Social History   Tobacco Use  Smoking Status Former   Pack years: 0.00   Types: Cigars   Quit date: 2015   Years since quitting: 7.4  Smokeless Tobacco Never  Tobacco Comments   Pt states smokes occasional cigar.  This is typically < 1 time per month   BP Readings from Last 3 Encounters:  02/24/21 138/84  01/27/21 131/75  12/31/20 (!) 150/80   Pulse Readings from Last 3 Encounters:  02/24/21 78  01/27/21 64  12/31/20 61   Wt Readings from Last 3 Encounters:  02/24/21 231 lb (104.8 kg)  01/27/21 233 lb (105.7 kg)  12/31/20 238 lb 9.6 oz (108.2 kg)   BMI Readings from Last 3 Encounters:  02/24/21 33.62 kg/m  01/27/21 33.91 kg/m  12/31/20 34.64 kg/m    Assessment/Interventions: Review of patient past medical history, allergies, medications, health status, including review of consultants reports, laboratory and other test data, was performed as part of comprehensive evaluation and provision of chronic care management services.   SDOH:  (Social Determinants of Health) assessments and interventions performed: Yes  Financial Resource Strain: Not on file    SDOH Screenings   Alcohol Screen: Not on file  Depression (PHQ2-9): Low Risk    PHQ-2 Score: 0  Financial Resource Strain: Not on file  Food Insecurity: Not on  file  Housing: Not on file  Physical Activity: Not on file  Social Connections: Not on file  Stress: Not on file  Tobacco Use: Medium Risk   Smoking Tobacco Use: Former   Smokeless Tobacco Use: Never  Transportation Needs: Not on file    [place]  Allergies  Allergen Reactions   Ibuprofen Other (See Comments)    stomach cramps  Other reaction(s): Unknown    Medications Reviewed Today     Reviewed by Six, Eden Lathe, LPN (Licensed  Practical Nurse) on 02/24/21 at 1100  Med List Status: <None>   Medication Order Taking? Sig Documenting Provider Last Dose Status Informant  amitriptyline (ELAVIL) 10 MG tablet [id number] Yes Take 0.5 tablets (5 mg total) by mouth at bedtime. Jamse Arn, MD Taking Active   amLODipine [facility]) 5 MG tablet [id number] Yes Take by mouth. [provider] Taking Active   aspirin EC 81 MG tablet [id number] Yes Take 81 mg by mouth daily. [provider] Taking Active Self  Capsaicin 0.1 % CREA [id number] Yes Apply twice a day to feet Jamse Arn, MD Taking Active   DULoxetine (CYMBALTA) 60 MG capsule [id number] Yes TAKE 1 CAPSULE BY MOUTH EVERY DAY Susy Frizzle, MD Taking Active   fexofenadine (ALLEGRA) 180 MG tablet [id number] Yes Take 180 mg by mouth daily. [provider] Taking Active Self  fluticasone (FLONASE) 50 MCG/ACT nasal spray [id number] Yes SPRAY 2 SPRAYS INTO EACH NOSTRIL EVERY DAY Susy Frizzle, MD Taking Active   hydrochlorothiazide (HYDRODIURIL) 25 MG tablet [id number] Yes Take 1 tablet (25 mg total) by mouth daily. Susy Frizzle, MD Taking Active   methocarbamol (ROBAXIN) 500 MG tablet [id number] Yes Take 1 tablet (500 mg total) by mouth every 8 (eight) hours as needed for muscle spasms. Jamse Arn, MD Taking Active   omeprazole (PRILOSEC) 20 MG capsule [id number] Yes Take 20 mg by mouth daily. [provider] Taking Active Self  pregabalin (LYRICA) 150 MG capsule [id number] Yes  Take 1 capsule (150 mg total) by mouth 2 (two) times daily. Jamse Arn, MD Taking Active   rosuvastatin (CRESTOR) 10 MG tablet [id number] Yes Take 1 tablet (10 mg total) by mouth daily. Jettie Booze, MD Taking Active   valsartan (DIOVAN) 160 MG tablet [id number] Yes TAKE 1 TABLET BY MOUTH EVERY DAY Susy Frizzle, MD Taking Active             Patient Active Problem List   Diagnosis Date Noted   Morbid obesity ([place]) 12/06/2020   Lumbosacral radiculopathy 11/01/2020   Sleep disturbance 11/01/2020   Polyneuropathy 09/24/2019   Dysesthesia 09/24/2019   Left foot pain 09/24/2019   Chest pain, atypical 11/27/2017   Thyroid nodule 10/25/2015   Cigarette smoker 04/20/2015   Essential hypertension 03/14/2015   Cough 03/14/2015   Multiple pulmonary nodules 03/14/2015   Lower respiratory tract infection 03/14/2015   SVT (supraventricular tachycardia) ([place]) 11/03/2012   Fatigue 11/03/2012   GERD (gastroesophageal reflux disease) 10/24/2012   Plantar fasciitis 12/21/2010   Routine general medical examination at a health care facility 12/16/2010   Prostate cancer screening 12/16/2010   SHOULDER PAIN, LEFT 11/26/2009   HAND PAIN, BILATERAL 11/26/2009   PERSONAL HX COLONIC POLYPS 10/26/2008   EXTERNAL HEMORRHOIDS 09/23/2008   HYPERTENSION 01/01/2007   Allergic rhinitis 01/01/2007   DIVERTICULOSIS, COLON 01/01/2007   DIVERTICULITIS, HX OF 01/01/2007    Immunization History  Administered Date(s) Administered   Fluad Quad(high Dose 65+) 08/10/2020   H1N1 09/23/2008   Influenza Split 06/04/2014   Influenza,inj,Quad PF,6+ Mos 08/08/2013, 05/17/2015, 10/17/2016   PFIZER(Purple Top)SARS-COV-2 Vaccination 09/25/2019, 10/16/2019, 08/30/2020   Pneumococcal Conjugate-13 10/17/2016   Pneumococcal Polysaccharide-23 03/11/2013   Td 03/04/2004   Tdap 10/17/2016    Conditions to be addressed/monitored:  HLD, Neuoropathy, HTN  Care Plan : General Pharmacy (Adult)  Updates  made by Edythe Clarity, RPH since 03/03/2021 12:00 AM     Problem: HLD, Neuoropathy, HTN   Priority: High  Onset Date: 03/03/2021  Long-Range Goal: Patient-Specific Goal   Start Date: 03/03/2021  Expected End Date: 09/02/2021  This Visit's Progress: On track  Priority: High  Note:   Current Barriers:  Unable to achieve control of cholesterol   Pharmacist Clinical Goal(s):  Patient will achieve adherence to monitoring guidelines and medication adherence to achieve therapeutic efficacy achieve control of cholesterol as evidenced by lab work adhere to prescribed medication regimen as evidenced by fill dates contact provider office for questions/concerns as evidenced notation of same in electronic health record through collaboration with PharmD and provider.   Interventions: 1:1 collaboration with Susy Frizzle, MD regarding development and update of comprehensive plan of care as evidenced by provider attestation and co-signature Inter-disciplinary care team collaboration (see longitudinal plan of care) Comprehensive medication review performed; medication list updated in electronic medical record  Hypertension (BP goal <140/90) -Controlled -Current treatment: Amlodipine 37m daily HCTZ 238mdaily Valsartan 16014maily -Medications previously tried: Olmesartan  -Current home readings: not checking at home  -Denies hypotensive/hypertensive symptoms -Educated on BP goals and benefits of medications for prevention of heart attack, stroke and kidney damage; Importance of home blood pressure monitoring; Symptoms of hypotension and importance of maintaining adequate hydration; -Counseled to monitor BP at home if symptomatic, document, and provide log at future appointments -Recommended to continue current medication  Hyperlipidemia: (LDL goal < 100) -Uncontrolled -Current treatment: Rosuvastatin 69m61mily -Medications previously tried: none noted   -Educated on  Cholesterol goals;  Benefits of statin for ASCVD risk reduction; Importance of limiting foods high in cholesterol; -Most recent LDL above goal, patient started statin after those results. -He has been tolerating well and reports 100% adherence -Recommended to continue current medication Will have labs checked next month with Dr. VaraIrish Lackeuropathy (Goal: Minimize symptoms) -Not ideally controlled -Current treatment  Lyrica 150mg40m Duloxetine 60mg 51my Capsaicin 0.1% cream twice daily Amitripytyline 69mg d33m -Medications previously tried: gabapentin -Pain still comes and goes, it is tolerable at the moment. -Discussed the need to taper off Cymbalta if he ever decided he wants to come off.  -Recommended to continue current medication  Patient Goals/Self-Care Activities Patient will:  - take medications as prescribed focus on medication adherence by pill count check blood pressure when symptomatic, document, and provide at future appointments   Follow Up Plan: The care management team will reach out to the patient again over the next 180 days.         Medication Assistance: None required.  Patient affirms current coverage meets needs.  Compliance/Adherence/Medication fill history: Care Gaps: Zoster vaccines    Patient's preferred pharmacy is:  [id number] [phone] box? Yes Pt endorses 100% compliance  We discussed: Benefits of medication synchronization, packaging and delivery as well as enhanced pharmacist oversight with [employer]. Patient decided to: Continue current medication management strategy  Care Plan and Follow Up Patient  Decision:  Patient agrees to Care Plan and Follow-up.  Plan: The care management team will reach out to the patient again over the next 180 days.  ChristiaBeverly Milch Clinical Pharmacist [phone]

## 2021-02-24 ENCOUNTER — Ambulatory Visit: Payer: Medicare Other | Admitting: Family Medicine

## 2021-02-24 ENCOUNTER — Encounter: Payer: Self-pay | Admitting: Family Medicine

## 2021-02-24 ENCOUNTER — Other Ambulatory Visit: Payer: Self-pay

## 2021-02-24 VITALS — BP 138/84 | HR 78 | Temp 98.2°F | Resp 16 | Ht 69.5 in | Wt 231.0 lb

## 2021-02-24 DIAGNOSIS — M5431 Sciatica, right side: Secondary | ICD-10-CM

## 2021-02-24 MED ORDER — PREDNISONE 20 MG PO TABS: ORAL_TABLET | ORAL | 0 refills | Status: DC

## 2021-02-24 NOTE — Progress Notes (Addendum)
Subjective:    Patient ID: Robert Page, male    DOB: Feb 07, 1946, 75 y.o.   MRN: [id number]  HPI  Patient has a history of peripheral neuropathy.  He is seeing a pain specialist.  He is currently on high-dose Lyrica 150 mg twice a day, Cymbalta 60 mg once a day, and amitriptyline.  He states that last week he was more confused and a little disoriented.  He denies any infections.  He denies any cough.  He denies any dysuria.  He denies any head trauma.  He states that he was working.  He was able to complete all of his task.  However he had to concentrate more as he felt discombobulated.  He felt drunk and dizzy.  He has been sweating more due to the heat as he works as a Psychologist, sport and exercise.  In addition to the severe pain in both feet from the peripheral neuropathy he is also having shooting pain radiating from his right gluteus down his right leg into his right foot.  He had an MRI performed in 2021 by Dr. Ellene Route which showed degenerative disc disease from L3-S1.  I have reviewed the MRI today with the patient.  There was no significant nerve impingement.  He did have possible foraminal stenosis at L4-L5 potentially contacting the right nerve root at that level.  He underwent epidural steroid injections at that time under the care of a neurosurgeon without any benefit.  He is questioning as to when he should repeat the MRI.  Today on examination, cranial nerves II through XII are grossly intact with muscle strength 5/5 equal and symmetric in the upper and lower extremities.  He does have diminished reflexes checked at the patella and the Achilles in both legs with these are symmetric.  His strength in his legs are equal and symmetric.  He has diminished sensation in both feet distal to the ankle but this is chronic.  Otherwise there is no new neurologic deficit.  However he is having worsening pain radiating down his right leg Past Medical History:  Diagnosis Date  . Adenomatous polyp   . Allergy   . Atrial  tachycardia ([place])   . Diverticulosis   . Dupuytren's disease    palms and soles  . GERD (gastroesophageal reflux disease)   . Hypertension    Past Surgical History:  Procedure Laterality Date  . BACK SURGERY     12/2013  . CARDIAC CATHETERIZATION  1990's  . COLECTOMY  2010   for diverticulitis  . COLON SURGERY N/A    [employer]rees[employer] 08/27/2020  . COLONOSCOPY  04/03/2013  . LASIK  2002   [place]  . POLYPECTOMY     Current Outpatient Medications on File Prior to Visit  Medication Sig Dispense Refill  . amitriptyline (ELAVIL) 10 MG tablet Take 0.5 tablets (5 mg total) by mouth at bedtime. 15 tablet 1  . amLODipine (NORVASC) 5 MG tablet Take by mouth.    Marland Kitchen aspirin EC 81 MG tablet Take 81 mg by mouth daily.    . Capsaicin 0.1 % CREA Apply twice a day to feet 60 g 1  . DULoxetine (CYMBALTA) 60 MG capsule TAKE 1 CAPSULE BY MOUTH EVERY DAY 90 capsule 3  . fexofenadine (ALLEGRA) 180 MG tablet Take 180 mg by mouth daily.    . fluticasone (FLONASE) 50 MCG/ACT nasal spray SPRAY 2 SPRAYS INTO EACH NOSTRIL EVERY DAY 48 mL 0  . hydrochlorothiazide (HYDRODIURIL) 25 MG tablet Take 1 tablet (25 mg total)  by mouth daily. 90 tablet 3  . methocarbamol (ROBAXIN) 500 MG tablet Take 1 tablet (500 mg total) by mouth every 8 (eight) hours as needed for muscle spasms. 90 tablet 3  . omeprazole (PRILOSEC) 20 MG capsule Take 20 mg by mouth daily.    . pregabalin (LYRICA) 150 MG capsule Take 1 capsule (150 mg total) by mouth 2 (two) times daily. 60 capsule 2  . rosuvastatin (CRESTOR) 10 MG tablet Take 1 tablet (10 mg total) by mouth daily. 90 tablet 3  . valsartan (DIOVAN) 160 MG tablet TAKE 1 TABLET BY MOUTH EVERY DAY 30 tablet 1   No current facility-administered medications on file prior to visit.   Allergies  Allergen Reactions  . Ibuprofen Other (See Comments)    stomach cramps  Other reaction(s): Unknown   Social History   Socioeconomic History  . Marital status: Married    Spouse name: Rahm Minix   . Number of children: 3  . Years of education: Not on file  . Highest education level: Not on file  Occupational History  . Occupation: Retired  Tobacco Use  . Smoking status: Former    Pack years: 0.00    Types: Cigars    Quit date: 2015    Years since quitting: 7.4  . Smokeless tobacco: Never  . Tobacco comments:    Pt states smokes occasional cigar.  This is typically < 1 time per month  Vaping Use  . Vaping Use: Never used  Substance and Sexual Activity  . Alcohol use: No    Alcohol/week: 0.0 standard drinks  . Drug use: No  . Sexual activity: Not on file  Other Topics Concern  . Not on file  Social History Narrative   Pt lives in [place].     Retired from [employer] and [employer].     Goes on mission trips to [place], gets regular exercise.   Caffeine use: very rare, Diet coke   Right handed    Social Determinants of Health   Financial Resource Strain: Not on file  Food Insecurity: Not on file  Transportation Needs: Not on file  Physical Activity: Not on file  Stress: Not on file  Social Connections: Not on file  Intimate Partner Violence: Not on file     Review of Systems     Objective:   Physical Exam Vitals reviewed.  Constitutional:      General: He is not in acute distress.    Appearance: Normal appearance. He is normal weight. He is not ill-appearing, toxic-appearing or diaphoretic.  HENT:     Head: Normocephalic and atraumatic.     Right Ear: Tympanic membrane and ear canal normal.     Left Ear: Tympanic membrane and ear canal normal.     Mouth/Throat:     Pharynx: No oropharyngeal exudate or posterior oropharyngeal erythema.  Eyes:     Extraocular Movements: Extraocular movements intact.     Conjunctiva/sclera: Conjunctivae normal.  Neck:     Vascular: No carotid bruit.  Cardiovascular:     Rate and Rhythm: Normal rate and regular rhythm.     Heart sounds: Normal heart sounds. No murmur heard.   No friction rub.  Pulmonary:     Effort:  Pulmonary effort is normal. No respiratory distress.     Breath sounds: Normal breath sounds. No wheezing, rhonchi or rales.  Abdominal:     General: Bowel sounds are normal. There is no distension.     Palpations: Abdomen is soft.  Tenderness: There is no abdominal tenderness.  Lymphadenopathy:     Cervical: No cervical adenopathy.  Neurological:     General: No focal deficit present.     Mental Status: He is alert and oriented to person, place, and time. Mental status is at baseline.     Cranial Nerves: No cranial nerve deficit.     Sensory: Sensory deficit present.     Motor: No weakness.     Coordination: Coordination normal.     Gait: Gait normal.     Deep Tendon Reflexes: Reflexes abnormal.  Psychiatric:        Mood and Affect: Mood normal.        Behavior: Behavior normal.        Thought Content: Thought content normal.          Assessment & Plan:  Right sided sciatica - Plan: COMPLETE METABOLIC PANEL WITH GFR, CBC with Differential/Platelet I believe the confusion last week was due to medication coupled with dehydration.  Patient has chronic peripheral neuropathy.  However he is also having new right-sided sciatica.  Begin prednisone taper pack for the new sciatica and consider repeating an MRI of the lumbar spine if worsening.  Also obtain CBC and CMP to evaluate for any other metabolic abnormalities that may potentiate his medications as far as causing mild confusion

## 2021-02-26 LAB — COMPLETE METABOLIC PANEL WITH GFR
AG Ratio: 2.1 (calc) (ref 1.0–2.5)
ALT: 22 U/L (ref 9–46)
AST: 20 U/L (ref 10–35)
Albumin: 4.4 g/dL (ref 3.6–5.1)
Alkaline phosphatase (APISO): 58 U/L (ref 35–144)
BUN: 23 mg/dL (ref 7–25)
CO2: 30 mmol/L (ref 20–32)
Calcium: 9.4 mg/dL (ref 8.6–10.3)
Chloride: 103 mmol/L (ref 98–110)
Creat: 1.07 mg/dL (ref 0.70–1.18)
GFR, Est African American: 79 mL/min/{1.73_m2} (ref >=60)
GFR, Est Non African American: 68 mL/min/{1.73_m2} (ref >=60)
Globulin: 2.1 g/dL (calc) (ref 1.9–3.7)
Glucose, Bld: 122 mg/dL — ABNORMAL HIGH (ref 65–99)
Potassium: 4.4 mmol/L (ref 3.5–5.3)
Sodium: 142 mmol/L (ref 135–146)
Total Bilirubin: 0.4 mg/dL (ref 0.2–1.2)
Total Protein: 6.5 g/dL (ref 6.1–8.1)

## 2021-02-26 LAB — CBC WITH DIFFERENTIAL/PLATELET
Absolute Monocytes: 548 cells/uL (ref 200–950)
Basophils Absolute: 69 cells/uL (ref 0–200)
Basophils Relative: 1.1 %
Eosinophils Absolute: 221 cells/uL (ref 15–500)
Eosinophils Relative: 3.5 %
HCT: 44.7 % (ref 38.5–50.0)
Hemoglobin: 15.1 g/dL (ref 13.2–17.1)
Lymphs Abs: 1266 cells/uL (ref 850–3900)
MCH: 30 pg (ref 27.0–33.0)
MCHC: 33.8 g/dL (ref 32.0–36.0)
MCV: 88.7 fL (ref 80.0–100.0)
MPV: 11.1 fL (ref 7.5–12.5)
Monocytes Relative: 8.7 %
Neutro Abs: 4196 cells/uL (ref 1500–7800)
Neutrophils Relative %: 66.6 %
Platelets: 256 10*3/uL (ref 140–400)
RBC: 5.04 10*6/uL (ref 4.20–5.80)
RDW: 12.5 % (ref 11.0–15.0)
Total Lymphocyte: 20.1 %
WBC: 6.3 10*3/uL (ref 3.8–10.8)

## 2021-02-26 LAB — HEMOGLOBIN A1C
Hgb A1c MFr Bld: 5.9 % of total Hgb — ABNORMAL HIGH (ref <5.7)
Mean Plasma Glucose: 123 mg/dL
eAG (mmol/L): 6.8 mmol/L

## 2021-02-26 LAB — TEST AUTHORIZATION

## 2021-03-03 ENCOUNTER — Ambulatory Visit (INDEPENDENT_AMBULATORY_CARE_PROVIDER_SITE_OTHER): Payer: 59 | Admitting: Pharmacist

## 2021-03-03 DIAGNOSIS — I10 Essential (primary) hypertension: Secondary | ICD-10-CM

## 2021-03-03 DIAGNOSIS — G629 Polyneuropathy, unspecified: Secondary | ICD-10-CM

## 2021-03-03 NOTE — Patient Instructions (Addendum)
Visit Information   Goals Addressed             This Visit's Progress    Track and Manage My Blood Pressure-Hypertension       Timeframe:  Long-Range Goal Priority:  High Start Date:  03/03/21                           Expected End Date: 09/02/21                      Follow Up Date 06/03/21    - check blood pressure 3 times per week - choose a place to take my blood pressure (home, clinic or office, retail store) - write blood pressure results in a log or diary    Why is this important?   You won't feel high blood pressure, but it can still hurt your blood vessels.  High blood pressure can cause heart or kidney problems. It can also cause a stroke.  Making lifestyle changes like losing a little weight or eating less salt will help.  Checking your blood pressure at home and at different times of the day can help to control blood pressure.  If the doctor prescribes medicine remember to take it the way the doctor ordered.  Call the office if you cannot afford the medicine or if there are questions about it.     Notes:         Patient Care Plan: General Pharmacy (Adult)     Problem Identified: HLD, Neuoropathy, HTN   Priority: High  Onset Date: 03/03/2021     Long-Range Goal: Patient-Specific Goal   Start Date: 03/03/2021  Expected End Date: 09/02/2021  This Visit's Progress: On track  Priority: High  Note:   Current Barriers:  Unable to achieve control of cholesterol   Pharmacist Clinical Goal(s):  Patient will achieve adherence to monitoring guidelines and medication adherence to achieve therapeutic efficacy achieve control of cholesterol as evidenced by lab work adhere to prescribed medication regimen as evidenced by fill dates contact provider office for questions/concerns as evidenced notation of same in electronic health record through collaboration with PharmD and provider.   Interventions: 1:1 collaboration with Susy Frizzle, MD regarding development  and update of comprehensive plan of care as evidenced by provider attestation and co-signature Inter-disciplinary care team collaboration (see longitudinal plan of care) Comprehensive medication review performed; medication list updated in electronic medical record  Hypertension (BP goal <140/90) -Controlled -Current treatment: Amlodipine 5mg  daily HCTZ 25mg  daily Valsartan 160mg  daily -Medications previously tried: Olmesartan  -Current home readings: not checking at home  -Denies hypotensive/hypertensive symptoms -Educated on BP goals and benefits of medications for prevention of heart attack, stroke and kidney damage; Importance of home blood pressure monitoring; Symptoms of hypotension and importance of maintaining adequate hydration; -Counseled to monitor BP at home if symptomatic, document, and provide log at future appointments -Recommended to continue current medication  Hyperlipidemia: (LDL goal < 100) -Uncontrolled -Current treatment: Rosuvastatin 10mg  daily -Medications previously tried: none noted   -Educated on Cholesterol goals;  Benefits of statin for ASCVD risk reduction; Importance of limiting foods high in cholesterol; -Most recent LDL above goal, patient started statin after those results. -He has been tolerating well and reports 100% adherence -Recommended to continue current medication Will have labs checked next month with Dr. Irish Lack.  Neuropathy (Goal: Minimize symptoms) -Not ideally controlled -Current treatment  Lyrica 150mg  BID Duloxetine 60mg  daily  Capsaicin 0.1% cream twice daily Amitripytyline 10mg  daily -Medications previously tried: gabapentin -Pain still comes and goes, it is tolerable at the moment. -Discussed the need to taper off Cymbalta if he ever decided he wants to come off.  -Recommended to continue current medication  Patient Goals/Self-Care Activities Patient will:  - take medications as prescribed focus on medication  adherence by pill count check blood pressure when symptomatic, document, and provide at future appointments   Follow Up Plan: The care management team will reach out to the patient again over the next 180 days.        The patient verbalized understanding of instructions, educational materials, and care plan provided today and agreed to receive a mailed copy of patient instructions, educational materials, and care plan.  Telephone follow up appointment with pharmacy team member scheduled for: 6 months  Edythe Clarity, [facility]

## 2021-03-06 ENCOUNTER — Other Ambulatory Visit: Payer: Self-pay | Admitting: Family Medicine

## 2021-03-06 DIAGNOSIS — I10 Essential (primary) hypertension: Secondary | ICD-10-CM

## 2021-03-11 ENCOUNTER — Other Ambulatory Visit: Payer: Self-pay

## 2021-03-11 ENCOUNTER — Encounter: Payer: Self-pay | Admitting: Physical Medicine and Rehabilitation

## 2021-03-11 ENCOUNTER — Encounter: Payer: Medicare Other | Attending: Physical Medicine & Rehabilitation | Admitting: Physical Medicine and Rehabilitation

## 2021-03-11 VITALS — BP 132/80 | HR 63 | Temp 97.8°F | Ht 69.5 in | Wt 233.0 lb

## 2021-03-11 DIAGNOSIS — G629 Polyneuropathy, unspecified: Secondary | ICD-10-CM

## 2021-03-11 DIAGNOSIS — M5417 Radiculopathy, lumbosacral region: Secondary | ICD-10-CM

## 2021-03-11 NOTE — Patient Instructions (Addendum)
TENS unit  -Discussed following foods that may reduce pain: 1) Ginger (especially studied for arthritis)- reduce leukotriene production to decrease inflammation 2) Blueberries- high in phytonutrients that decrease inflammation 3) Salmon- marine omega-3s reduce joint swelling and pain 4) Pumpkin seeds- reduce inflammation 5) dark chocolate- reduces inflammation 6) turmeric- reduces inflammation 7) tart cherries - reduce pain and stiffness 8) extra virgin olive oil - its compound olecanthal helps to block prostaglandins  9) chili peppers- can be eaten or applied topically via capsaicin 10) mint- helpful for headache, muscle aches, joint pain, and itching 11) garlic- reduces inflammation  Link to further information on diet for chronic pain: [email or web address]

## 2021-03-11 NOTE — Progress Notes (Signed)
Subjective:    Patient ID: Robert Page, male    DOB: [birth year], 75 y.o.   MRN: [id number]  HPI   Pain Inventory Average Pain 3 Pain Right Now 4 My pain is constant, sharp, burning, and stiffness  In the last 24 hours, has pain interfered with the following? General activity 7 Relation with others 0 Enjoyment of life 7 What TIME of day is your pain at its worst? night Sleep (in general) Good  Pain is worse with: sitting Pain improves with:  walking Relief from Meds: 5  Family History  Problem Relation Age of Onset   Hypertension Mother    Macular degeneration Mother    Kidney disease Mother    Dementia Mother    Hypertension Father    Melanoma Sister    Stroke Other        uncle   Prostate cancer Other        uncle   Colon cancer Other        grandmother/uncle   Kidney disease Other        grandmother   Liver cancer Other        uncle (mets)   Heart disease Other        aunt/uncle   Lung cancer Other        uncle   Stomach cancer Cousin    Heart disease Cousin    Colon cancer Paternal Grandmother    Colon cancer Paternal Uncle    Prostate cancer Paternal Uncle    Arthritis Brother    Hypertension Brother    Esophageal cancer Neg Hx    Rectal cancer Neg Hx    Social History   Socioeconomic History   Marital status: Married    Spouse name: Cicero Noy   Number of children: 3   Years of education: Not on file   Highest education level: Not on file  Occupational History   Occupation: Retired  Tobacco Use   Smoking status: Former    Pack years: 0.00    Types: Cigars    Quit date: 2015    Years since quitting: 7.5   Smokeless tobacco: Never   Tobacco comments:    Pt states smokes occasional cigar.  This is typically < 1 time per month  Vaping Use   Vaping Use: Never used  Substance and Sexual Activity   Alcohol use: No    Alcohol/week: 0.0 standard drinks   Drug use: No   Sexual activity: Not on file  Other Topics Concern   Not on  file  Social History Narrative   Pt lives in [place].     Retired from [employer] and [employer].     Goes on mission trips to [place], gets regular exercise.   Caffeine use: very rare, Diet coke   Right handed    Social Determinants of Health   Financial Resource Strain: Not on file  Food Insecurity: Not on file  Transportation Needs: Not on file  Physical Activity: Not on file  Stress: Not on file  Social Connections: Not on file   Past Surgical History:  Procedure Laterality Date   BACK SURGERY     12/2013   CARDIAC CATHETERIZATION  1990's   COLECTOMY  2010   for diverticulitis   COLON SURGERY N/A    [employer] 08/27/2020   COLONOSCOPY  04/03/2013   LASIK  2002   Bil   POLYPECTOMY     Past Surgical History:  Procedure Laterality Date  BACK SURGERY     12/2013   CARDIAC CATHETERIZATION  1990's   COLECTOMY  2010   for diverticulitis   COLON SURGERY N/A    [employer] 08/27/2020   COLONOSCOPY  04/03/2013   LASIK  2002   Bil   POLYPECTOMY     Past Medical History:  Diagnosis Date   Adenomatous polyp    Allergy    Atrial tachycardia ([place])    Diverticulosis    Dupuytren's disease    palms and soles   GERD (gastroesophageal reflux disease)    Hypertension    There were no vitals taken for this visit.  Opioid Risk Score:   Fall Risk Score:  `1  Depression screen PHQ 2/9  Depression screen [facility] 2/9 12/06/2020 11/01/2020 08/30/2020 11/19/2017 11/06/2017 11/13/2016 03/14/2015  Decreased Interest 0 0 0 0 0 0 0  Down, Depressed, Hopeless 0 0 0 0 0 0 0  PHQ - 2 Score 0 0 0 0 0 0 0  Altered sleeping - 0 - 2 - 0 -  Tired, decreased energy - 1 - 2 - 0 -  Change in appetite - 0 - 0 - 0 -  Feeling bad or failure about yourself  - 0 - 0 - 0 -  Trouble concentrating - 0 - 0 - 0 -  Moving slowly or fidgety/restless - 0 - 0 - 0 -  Suicidal thoughts - 0 - 0 - 0 -  PHQ-9 Score - 1 - 4 - 0 -  Difficult doing work/chores - - - Not difficult at all - Not difficult at all -    Review  of Systems  Musculoskeletal:        Pain the upper legs  All other systems reviewed and are negative.     Objective:   Physical Exam        Assessment & Plan:

## 2021-03-11 NOTE — Progress Notes (Signed)
Subjective:    Patient ID: Robert Page, male    DOB: 1946/08/18, 75 y.o.   MRN: [id number]  HPI Male with pmh/psh of polyneuropathy, atrial tachycardia, back surgery (?laminectomy L4-5), left foot surgery (tumor removal x4), dupuytens (palms and soles), HTN, GERD presents for follow-up of bilateral neuropathy.  Initially stated: Started summer 2000.  He had foot surgery in left foot in ~2016. Progressing.  Radiating proximally.  No symptoms in hands.  Ambulating and activity improves the pain. Shoes and inactivity exacerbate the pain. Hot and tingling/burning.  He notes I am the 6th doctor he's been to. He saw PCP and Ortho, who did MRI and told non-surgical.  He was referred to Rheum, who did not believe it was RA and thought it was L4-5 radic.  He was referred to Neurology, who ordered NCV/EMG (showing polyneuropathy, left peroneal neuropathy, Left L5/S1 radic) and MRI.  MRI C-spine showing mild disc bulges C3-6. He had MRI L-spine.  He saw Neurosurg, who did Myelogram and was told symptoms not originating in back.  Constant.  Associated numbness.  Gabapentin, Lyrica, Cymbalta did not help. He was told he has tried "every single medication for neuropathy" without benefit. He notes improvement when he sleeps in a different bed.  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 Pain does not limit activities.   Last clinic visit on 12/06/20.  Since that time, patient states he has not tried heat. He states he had burning with Capsacin. He obtained Vitamin levels. Sleep has improved.  Patient states he was losing weight, but gained in on vacation. Patient states he often does not have time to take care of his body, take meds consistently, etc.   -He is taking Lyrica and Cymbalta. He has tightness and stiffness in both lower extremities. He had a dose of steroid dose pack that did provide some relief to his back pain.  If he is barefoot in the shower he feels off balance. He [facility]an [facility]. He has had prior foot surgery- was told he had arthritis. He saw rheumatology and was told he does not have rheumatoid arthritis. He had EMG/NCS (results above).   Pain Inventory Average Pain 5 Pain Right Now 4 My pain is sharp and tighness in feet.   In the last 24 hours, has pain interfered with the following? General activity 4 Relation with others 5 Enjoyment of life 6 What TIME of day is your pain at its worst? morning , evening and night Sleep (in general) Good  Pain is worse with: bending, sitting and inactivity Pain improves with: medication and injections Relief from Meds: 1   Family History  Problem Relation Age of Onset   Hypertension Mother    Macular degeneration Mother    Kidney disease Mother    Dementia Mother    Hypertension Father    Melanoma Sister    Stroke Other        uncle   Prostate cancer Other        uncle   Colon cancer Other        grandmother/uncle   Kidney disease Other        grandmother   Liver cancer Other        uncle (mets)   Heart disease Other        aunt/uncle   Lung cancer Other  uncle   Stomach cancer Cousin    Heart disease Cousin    Colon cancer Paternal Grandmother    Colon cancer Paternal Uncle    Prostate cancer Paternal Uncle    Arthritis Brother    Hypertension Brother    Esophageal cancer Neg Hx    Rectal cancer Neg Hx    Social History   Socioeconomic History   Marital status: Married    Spouse name: Jousha Schwandt   Number of children: 3   Years of education: Not on file   Highest education level: Not on file  Occupational History   Occupation: Retired  Tobacco Use   Smoking status: Former    Pack years: 0.00    Types: Cigars    Quit date: 2015    Years since quitting: 7.5   Smokeless tobacco: Never   Tobacco comments:    Pt states smokes occasional cigar.  This is typically < 1 time per month   Vaping Use   Vaping Use: Never used  Substance and Sexual Activity   Alcohol use: No    Alcohol/week: 0.0 standard drinks   Drug use: No   Sexual activity: Not on file  Other Topics Concern   Not on file  Social History Narrative   Pt lives in [place].     Retired from [employer] and [employer].     Goes on mission trips to [place], gets regular exercise.   Caffeine use: very rare, Diet coke   Right handed    Social Determinants of Health   Financial Resource Strain: Not on file  Food Insecurity: Not on file  Transportation Needs: Not on file  Physical Activity: Not on file  Stress: Not on file  Social Connections: Not on file   Past Surgical History:  Procedure Laterality Date   BACK SURGERY     12/2013   CARDIAC CATHETERIZATION  1990's   COLECTOMY  2010   for diverticulitis   COLON SURGERY N/A    [facility] 08/27/2020   COLONOSCOPY  04/03/2013   LASIK  2002   Bil   POLYPECTOMY     Past Medical History:  Diagnosis Date   Adenomatous polyp    Allergy    Atrial tachycardia ([place])    Diverticulosis    Dupuytren's disease    palms and soles   GERD (gastroesophageal reflux disease)    Hypertension    There were no vitals taken for this visit.  Opioid Risk Score:   Fall Risk Score:  `1  Depression screen PHQ 2/9  Depression screen [facility] 2/9 12/06/2020 11/01/2020 08/30/2020 11/19/2017 11/06/2017 11/13/2016 03/14/2015  Decreased Interest 0 0 0 0 0 0 0  Down, Depressed, Hopeless 0 0 0 0 0 0 0  PHQ - 2 Score 0 0 0 0 0 0 0  Altered sleeping - 0 - 2 - 0 -  Tired, decreased energy - 1 - 2 - 0 -  Change in appetite - 0 - 0 - 0 -  Feeling bad or failure about yourself  - 0 - 0 - 0 -  Trouble concentrating - 0 - 0 - 0 -  Moving slowly or fidgety/restless - 0 - 0 - 0 -  Suicidal thoughts - 0 - 0 - 0 -  PHQ-9 Score - 1 - 4 - 0 -  Difficult doing work/chores - - - Not difficult at all - Not difficult at all -   Review of Systems     Objective:  Physical Exam  Gen: no  distress, normal appearing HEENT: oral mucosa pink and moist, NCAT Cardio: Reg rate Chest: normal effort, normal rate of breathing Abd: soft, non-distended Ext: no edema Psych: pleasant, normal affect Skin: intact Neuro: HOH Motor: B/l LE: 5/5 throughout Sensation diminished to light touch b/l LE Vibration diminished in b/l LE knees and distally, intact UE (previously)    Assessment & Plan:  Male with pmh/psh ofpolyneuropathy, atrial tachycardia, back surgery (?laminectomy L4-5), left foot surgery (tumor removal x4), dupuytens (palms and soles), HTN, GERD presents for f/u b/l feet pain.  1. Chronic bilateral feet pain -   MRI foot report unremarkable.  MRI back report showing ? L5-S1 radic  NCS/EMG - showing polyneuropathy and L5-SI radic on left, results reviewed.   No benefit with Gabapentin  Trial Heat, reminded again  Will consider PT  Discussing purchasing TENS unit from [facility]  Trial OTC Lidocaine patch  Continue Lyrica 150 TID  Will consider referral to Neurosurg, pt would like to hold off at present  Will consider ESI, pt would like to hold off at present  Will consider PNS  Retry Capsacin, educated again  Patient states main goal is to be pain free - improving  Educated on importance of caring for self. -Discussed current symptoms of pain and history of pain.  -Discussed benefits of exercise in reducing pain. -Discussed following foods that may reduce pain: 1) Ginger (especially studied for arthritis)- reduce leukotriene production to decrease inflammation 2) Blueberries- high in phytonutrients that decrease inflammation 3) Salmon- marine omega-3s reduce joint swelling and pain 4) Pumpkin seeds- reduce inflammation 5) dark chocolate- reduces inflammation 6) turmeric- reduces inflammation 7) tart cherries - reduce pain and stiffness 8) extra virgin olive oil - its compound olecanthal helps to block prostaglandins  9) chili peppers- can be eaten or applied topically  via capsaicin 10) mint- helpful for headache, muscle aches, joint pain, and itching 11) garlic- reduces inflammation  Link to further information on diet for chronic pain: [email or web address]    2. Sleep disturbance  Continue Elavil to 10 or 5mg , educated on signs/symptoms of serotonin syndrome   Improving  3. Morbid Obesity  Gaining weight  Not interested in seeing dietitian  Educated again  4. Myalgia  Robaxin 500 TID PRN

## 2021-03-17 ENCOUNTER — Other Ambulatory Visit: Payer: Self-pay

## 2021-03-17 ENCOUNTER — Other Ambulatory Visit: Payer: Medicare Other

## 2021-03-17 DIAGNOSIS — E785 Hyperlipidemia, unspecified: Secondary | ICD-10-CM

## 2021-03-17 LAB — LIPID PANEL
Chol/HDL Ratio: 2.6 ratio (ref 0.0–5.0)
Cholesterol, Total: 134 mg/dL (ref 100–199)
HDL: 51 mg/dL (ref >39)
LDL Chol Calc (NIH): 64 mg/dL (ref 0–99)
Triglycerides: 105 mg/dL (ref 0–149)
VLDL Cholesterol Cal: 19 mg/dL (ref 5–40)

## 2021-03-17 LAB — HEPATIC FUNCTION PANEL
ALT: 23 IU/L (ref 0–44)
AST: 20 IU/L (ref 0–40)
Albumin: 4.4 g/dL (ref 3.7–4.7)
Alkaline Phosphatase: 65 IU/L (ref 44–121)
Bilirubin Total: 0.3 mg/dL (ref 0.0–1.2)
Bilirubin, Direct: 0.11 mg/dL (ref 0.00–0.40)
Total Protein: 6.6 g/dL (ref 6.0–8.5)

## 2021-04-03 ENCOUNTER — Other Ambulatory Visit: Payer: Self-pay | Admitting: Family Medicine

## 2021-04-03 DIAGNOSIS — I10 Essential (primary) hypertension: Secondary | ICD-10-CM

## 2021-04-22 ENCOUNTER — Encounter: Payer: Self-pay | Admitting: Family Medicine

## 2021-04-22 ENCOUNTER — Ambulatory Visit (INDEPENDENT_AMBULATORY_CARE_PROVIDER_SITE_OTHER): Payer: 59 | Admitting: Family Medicine

## 2021-04-22 ENCOUNTER — Other Ambulatory Visit: Payer: Self-pay

## 2021-04-22 VITALS — BP 134/68 | HR 72 | Temp 98.7°F | Resp 14 | Ht 69.5 in | Wt 230.0 lb

## 2021-04-22 DIAGNOSIS — R509 Fever, unspecified: Secondary | ICD-10-CM

## 2021-04-22 MED ORDER — AMOXICILLIN-POT CLAVULANATE 875-125 MG PO TABS: 1.0000 | ORAL_TABLET | Freq: Two times a day (BID) | ORAL | 0 refills | Status: DC

## 2021-04-22 NOTE — Progress Notes (Signed)
Subjective:    Patient ID: Robert Page, male    DOB: [birth year], 75 y.o.   MRN: [id number]  HPI Patient has a complicated history of diverticulitis and underwent a partial colectomy remotely due to recurrent diverticulitis.  He states that on Monday he developed subjective fevers.  He was reporting fevers on Monday and Tuesday along with fatigue and feeling weak and tired.  He developed sharp crampy left lower quadrant abdominal pain and also had diarrhea.  Symptoms persisted Wednesday and Thursday with the symptoms.  However today he feels better.  He is afebrile.  The pain has improved in his left lower quadrant and he has had no further diarrhea.  However on palpation to that he is still tender in the left lower quadrant   Past Medical History:  Diagnosis Date   Adenomatous polyp    Allergy    Atrial tachycardia ([place])    Diverticulosis    Dupuytren's disease    palms and soles   GERD (gastroesophageal reflux disease)    Hypertension    Past Surgical History:  Procedure Laterality Date   BACK SURGERY     12/2013   CARDIAC CATHETERIZATION  1990's   COLECTOMY  2010   for diverticulitis   COLON SURGERY N/A    [facility] 08/27/2020   COLONOSCOPY  04/03/2013   LASIK  2002   [facility]   POLYPECTOMY     Current Outpatient Medications on File Prior to Visit  Medication Sig Dispense Refill   amitriptyline (ELAVIL) 10 MG tablet Take 0.5 tablets (5 mg total) by mouth at bedtime. 15 tablet 1   amLODipine (NORVASC) 5 MG tablet Take by mouth.     aspirin EC 81 MG tablet Take 81 mg by mouth daily.     Capsaicin 0.1 % CREA Apply twice a day to feet 60 g 1   DULoxetine (CYMBALTA) 60 MG capsule TAKE 1 CAPSULE BY MOUTH EVERY DAY 90 capsule 3   fexofenadine (ALLEGRA) 180 MG tablet Take 180 mg by mouth daily.     fluticasone (FLONASE) 50 MCG/ACT nasal spray SPRAY 2 SPRAYS INTO EACH NOSTRIL EVERY DAY 48 mL 0   hydrochlorothiazide (HYDRODIURIL) 25 MG tablet Take 1 tablet (25 mg total) by mouth  daily. 90 tablet 3   omeprazole (PRILOSEC) 20 MG capsule Take 20 mg by mouth daily.     pregabalin (LYRICA) 150 MG capsule Take 1 capsule (150 mg total) by mouth 2 (two) times daily. 60 capsule 2   rosuvastatin (CRESTOR) 10 MG tablet Take 1 tablet (10 mg total) by mouth daily. 90 tablet 3   valsartan (DIOVAN) 160 MG tablet TAKE 1 TABLET BY MOUTH EVERY DAY 30 tablet 1   No current facility-administered medications on file prior to visit.   Allergies  Allergen Reactions   Ibuprofen Other (See Comments)    stomach cramps  Other reaction(s): Unknown   Social History   Socioeconomic History   Marital status: Married    Spouse name: Divine Nagarajan   Number of children: 3   Years of education: Not on file   Highest education level: Not on file  Occupational History   Occupation: Retired  Tobacco Use   Smoking status: Former    Types: Cigars    Quit date: 2015    Years since quitting: 7.6   Smokeless tobacco: Never   Tobacco comments:    Pt states smokes occasional cigar.  This is typically < 1 time per month  Vaping Use  Vaping Use: Never used  Substance and Sexual Activity   Alcohol use: No    Alcohol/week: 0.0 standard drinks   Drug use: No   Sexual activity: Not on file  Other Topics Concern   Not on file  Social History Narrative   Pt lives in [place].     Retired from [employer] and [employer].     Goes on mission trips to [place], gets regular exercise.   Caffeine use: very rare, Diet coke   Right handed    Social Determinants of Health   Financial Resource Strain: Not on file  Food Insecurity: Not on file  Transportation Needs: Not on file  Physical Activity: Not on file  Stress: Not on file  Social Connections: Not on file  Intimate Partner Violence: Not on file   Family History  Problem Relation Age of Onset   Hypertension Mother    Macular degeneration Mother    Kidney disease Mother    Dementia Mother    Hypertension Father    Melanoma Sister     Stroke Other        uncle   Prostate cancer Other        uncle   Colon cancer Other        grandmother/uncle   Kidney disease Other        grandmother   Liver cancer Other        uncle (mets)   Heart disease Other        aunt/uncle   Lung cancer Other        uncle   Stomach cancer Cousin    Heart disease Cousin    Colon cancer Paternal Grandmother    Colon cancer Paternal Uncle    Prostate cancer Paternal Uncle    Arthritis Brother    Hypertension Brother    Esophageal cancer Neg Hx    Rectal cancer Neg Hx      Review of Systems  All other systems reviewed and are negative.     Objective:   Physical Exam Vitals reviewed.  Constitutional:      General: He is not in acute distress.    Appearance: He is well-developed. He is not diaphoretic.  HENT:     Head: Normocephalic and atraumatic.     Right Ear: External ear normal.     Left Ear: External ear normal.     Nose: Nose normal.     Mouth/Throat:     Pharynx: No oropharyngeal exudate.  Eyes:     General: No scleral icterus.       Right eye: No discharge.        Left eye: No discharge.     Conjunctiva/sclera: Conjunctivae normal.     Pupils: Pupils are equal, round, and reactive to light.  Neck:     Thyroid: No thyromegaly.     Vascular: No JVD.     Trachea: No tracheal deviation.  Cardiovascular:     Rate and Rhythm: Normal rate and regular rhythm.     Heart sounds: Normal heart sounds. No murmur heard.   No friction rub. No gallop.  Pulmonary:     Effort: Pulmonary effort is normal. No respiratory distress.     Breath sounds: Normal breath sounds. No stridor. No wheezing or rales.  Chest:     Chest wall: No tenderness.  Abdominal:     General: Bowel sounds are normal. There is no distension.     Palpations: Abdomen is soft. There is  no mass.     Tenderness: There is abdominal tenderness. There is no guarding or rebound.    Musculoskeletal:        General: No tenderness. Normal range of motion.      Cervical back: Normal range of motion and neck supple.  Lymphadenopathy:     Cervical: No cervical adenopathy.  Skin:    General: Skin is warm.     Coloration: Skin is not pale.     Findings: No erythema or rash.  Neurological:     Mental Status: He is alert and oriented to person, place, and time.     Cranial Nerves: No cranial nerve deficit.     Motor: No abnormal muscle tone.     Coordination: Coordination normal.     Deep Tendon Reflexes: Reflexes are normal and symmetric.  Psychiatric:        Behavior: Behavior normal.        Thought Content: Thought content normal.        Judgment: Judgment normal.          Assessment & Plan:   Fever, unspecified fever cause Clinically the patient is much better.  I am concerned that he may have had a mild case of diverticulitis that is now starting to resolve.  I will give the patient a prescription for Augmentin but I have instructed him not to start taking the antibiotics.  I want to clinically monitor him over the next 24 to 48 hours.  If he again develops severe left lower quadrant abdominal pain and diarrhea and fever I want him to start the antibiotics.  Otherwise continue tincture of time as he seems to be improving.  I recommended that he rest and push fluids

## 2021-05-03 ENCOUNTER — Other Ambulatory Visit: Payer: Self-pay | Admitting: Family Medicine

## 2021-05-03 DIAGNOSIS — I10 Essential (primary) hypertension: Secondary | ICD-10-CM

## 2021-05-06 ENCOUNTER — Ambulatory Visit: Payer: Medicare Other | Admitting: Physical Medicine and Rehabilitation

## 2021-05-11 ENCOUNTER — Telehealth: Payer: Self-pay | Admitting: Pharmacist

## 2021-05-11 NOTE — Progress Notes (Addendum)
    Chronic Care Management Pharmacy Assistant   Name: Robert Page  MRN: [id number] DOB: 05/07/1946  Reason for Encounter: General Disease State Call   Conditions to be addressed/monitored: HLD, Neuoropathy, HTN  Recent office visits:  04/22/21 Dr. [name] For illness. STARTED Amoxicillin-Pot Clavulanate 875-125 mg 1 tablet 2 times daily. STOPPED Methocarbamol and Prednisone.   Recent consult visits:  03/11/21 [facility]. For follow-up. No medication changes.   Hospital visits:  None since 03/03/21  Medications: Outpatient Encounter Medications as of 05/11/2021  Medication Sig   amitriptyline (ELAVIL) 10 MG tablet Take 0.5 tablets (5 mg total) by mouth at bedtime.   amLODipine (NORVASC) 5 MG tablet Take by mouth.   amoxicillin-clavulanate (AUGMENTIN) 875-125 MG tablet Take 1 tablet by mouth 2 (two) times daily.   aspirin EC 81 MG tablet Take 81 mg by mouth daily.   Capsaicin 0.1 % CREA Apply twice a day to feet   DULoxetine (CYMBALTA) 60 MG capsule TAKE 1 CAPSULE BY MOUTH EVERY DAY   fexofenadine (ALLEGRA) 180 MG tablet Take 180 mg by mouth daily.   fluticasone (FLONASE) 50 MCG/ACT nasal spray SPRAY 2 SPRAYS INTO EACH NOSTRIL EVERY DAY   hydrochlorothiazide (HYDRODIURIL) 25 MG tablet Take 1 tablet (25 mg total) by mouth daily.   omeprazole (PRILOSEC) 20 MG capsule Take 20 mg by mouth daily.   pregabalin (LYRICA) 150 MG capsule Take 1 capsule (150 mg total) by mouth 2 (two) times daily.   rosuvastatin (CRESTOR) 10 MG tablet Take 1 tablet (10 mg total) by mouth daily.   valsartan (DIOVAN) 160 MG tablet TAKE 1 TABLET BY MOUTH EVERY DAY   No facility-administered encounter medications on file as of 05/11/2021.   GEN CALL: Patient stated he is feeling better from lasts month sickness. He stated he has been experiencing lower left side pain and he thinks its diverticulitis. He stated he started taking the medication for it. Patient stated he does not have any  questions or concerns about her medication at this time. Patient stated he is very active sometimes to active. He stated he has a good appetite and ensures he drinks enough water.   Care Gaps: Not on the list-N/A.   Star Rating Drugs: Valsartan 160 mg 05/04/21 30 DS, Rosuvastatin 10 mg 03/16/21 90 DS.   Follow-Up:Pharmacist Review Charlann Lange, RMA Clinical Pharmacist Assistant [phone]  10 minutes spent in review, coordination, and documentation.  Reviewed by: Beverly Milch, PharmD Clinical Pharmacist [phone]

## 2021-05-31 ENCOUNTER — Other Ambulatory Visit: Payer: Self-pay | Admitting: Family Medicine

## 2021-05-31 DIAGNOSIS — I10 Essential (primary) hypertension: Secondary | ICD-10-CM

## 2021-06-02 ENCOUNTER — Other Ambulatory Visit: Payer: Self-pay | Admitting: Physical Medicine & Rehabilitation

## 2021-06-02 NOTE — Telephone Encounter (Signed)
PMP was Reviewed.  Dr Ranell Patrick note was reviewed.  Lyrica e-scribed today.

## 2021-06-24 ENCOUNTER — Encounter: Payer: Medicare Other | Admitting: Physical Medicine and Rehabilitation

## 2021-06-25 ENCOUNTER — Other Ambulatory Visit: Payer: Self-pay | Admitting: Family Medicine

## 2021-06-25 DIAGNOSIS — I10 Essential (primary) hypertension: Secondary | ICD-10-CM

## 2021-07-22 ENCOUNTER — Other Ambulatory Visit: Payer: Self-pay | Admitting: Family Medicine

## 2021-07-22 DIAGNOSIS — I10 Essential (primary) hypertension: Secondary | ICD-10-CM

## 2021-08-09 ENCOUNTER — Encounter: Payer: Self-pay | Admitting: Physical Medicine and Rehabilitation

## 2021-08-09 ENCOUNTER — Other Ambulatory Visit: Payer: Self-pay

## 2021-08-09 ENCOUNTER — Encounter
Payer: Medicare Other | Attending: Physical Medicine and Rehabilitation | Admitting: Physical Medicine and Rehabilitation

## 2021-08-09 VITALS — BP 125/73 | HR 67 | Temp 97.8°F | Ht 69.5 in | Wt 230.8 lb

## 2021-08-09 DIAGNOSIS — G629 Polyneuropathy, unspecified: Secondary | ICD-10-CM | POA: Insufficient documentation

## 2021-08-09 DIAGNOSIS — G479 Sleep disorder, unspecified: Secondary | ICD-10-CM | POA: Insufficient documentation

## 2021-08-09 MED ORDER — DULOXETINE HCL 20 MG PO CPEP: 20.0000 mg | ORAL_CAPSULE | Freq: Every day | ORAL | 2 refills | Status: DC

## 2021-08-09 NOTE — Patient Instructions (Addendum)
NAC 600mg  twice per day to protect liver  Insomnia: -Try to go outside near sunrise -Get exercise during the day.  -Turn off all devices an hour before bedtime.  -Teas that can benefit: chamomile, valerian root, Brahmi (Bacopa) -Can consider over the counter melatonin or magnesium glycinate (latter can also help with constipation) -Pistachios naturally increase the production of melatonin

## 2021-08-09 NOTE — Progress Notes (Signed)
Subjective:    Patient ID: Robert Page, male    DOB: 04/06/1946, 75 y.o.   MRN: [id number]  HPI Male with pmh/psh of polyneuropathy, atrial tachycardia, back surgery (?laminectomy L4-5), left foot surgery (tumor removal x4), dupuytens (palms and soles), HTN, GERD presents for follow-up of bilateral neuropathy.  Initially stated: Started summer 2000.  He had foot surgery in left foot in ~2016. Progressing.  Radiating proximally.  No symptoms in hands.  Ambulating and activity improves the pain. Shoes and inactivity exacerbate the pain. Hot and tingling/burning.  He notes I am the 6th doctor he's been to. He saw PCP and Ortho, who did MRI and told non-surgical.  He was referred to Rheum, who did not believe it was RA and thought it was L4-5 radic.  He was referred to Neurology, who ordered NCV/EMG (showing polyneuropathy, left peroneal neuropathy, Left L5/S1 radic) and MRI.  MRI C-spine showing mild disc bulges C3-6. He had MRI L-spine.  He saw Neurosurg, who did Myelogram and was told symptoms not originating in back.  Constant.  Associated numbness.  Gabapentin, Lyrica, Cymbalta did not help. He was told he has tried "every single medication for neuropathy" without benefit. He notes improvement when he sleeps in a different bed.  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 Pain does not limit activities.   Last clinic visit on 12/06/20.  Since that time, patient states he has not tried heat. He states he had burning with Capsacin. He obtained Vitamin levels. Sleep has improved.  Patient states he was losing weight, but gained in on vacation. Patient states he often does not have time to take care of his body, take meds consistently, etc.   -He is taking Lyrica and Cymbalta. He has tightness and stiffness in both lower extremities. He had a dose of steroid dose pack that did provide some relief to his back pain.  If he is barefoot in the shower he feels off balance. He saw Dr. Doran Durand at [facility]. He has had prior foot surgery- was told he had arthritis. He saw rheumatology and was told he does not have rheumatoid arthritis. He had EMG/NCS (results above).   Both feet are really stiff He is not aware of Qutenza, but after explained he would like to try He is ok on his medicines- no refills needed right now He is not sure if Lyrica or Cymbalta are helping. He does not want to take things that are not needed  Pain Inventory Average Pain 5 Pain Right Now 3 My pain is burning at times, stabbing, aching  In the last 24 hours, has pain interfered with the following? General activity 1 Relation with others 2 Enjoyment of life 4 What TIME of day is your pain at its worst? Daytime and evening Sleep (in general) Good  Pain is worse with: sitting, standing, some activities Pain improves with: rest, possibly medication Relief from Meds: 1   Family History  Problem Relation Age of Onset   Hypertension Mother    Macular degeneration Mother    Kidney disease Mother    Dementia Mother    Hypertension Father    Melanoma Sister    Stroke Other        uncle   Prostate cancer Other        uncle   Colon cancer Other  grandmother/uncle   Kidney disease Other        grandmother   Liver cancer Other        uncle (mets)   Heart disease Other        aunt/uncle   Lung cancer Other        uncle   Stomach cancer Cousin    Heart disease Cousin    Colon cancer Paternal Grandmother    Colon cancer Paternal Uncle    Prostate cancer Paternal Uncle    Arthritis Brother    Hypertension Brother    Esophageal cancer Neg Hx    Rectal cancer Neg Hx    Social History   Socioeconomic History   Marital status: Married    Spouse name: Jewell Ryans   Number of children: 3   Years of education: Not on file   Highest education level: Not on file  Occupational History   Occupation: Retired   Tobacco Use   Smoking status: Former    Types: Cigars    Quit date: 2015    Years since quitting: 7.9   Smokeless tobacco: Never   Tobacco comments:    Pt states smokes occasional cigar.  This is typically < 1 time per month  Vaping Use   Vaping Use: Never used  Substance and Sexual Activity   Alcohol use: No    Alcohol/week: 0.0 standard drinks   Drug use: No   Sexual activity: Not on file  Other Topics Concern   Not on file  Social History Narrative   Pt lives in [place].     Retired from [employer] and [employer].     Goes on mission trips to [place], gets regular exercise.   Caffeine use: very rare, Diet coke   Right handed    Social Determinants of Health   Financial Resource Strain: Not on file  Food Insecurity: Not on file  Transportation Needs: Not on file  Physical Activity: Not on file  Stress: Not on file  Social Connections: Not on file   Past Surgical History:  Procedure Laterality Date   BACK SURGERY     12/2013   CARDIAC CATHETERIZATION  1990's   COLECTOMY  2010   for diverticulitis   COLON SURGERY N/A    [facility] 08/27/2020   COLONOSCOPY  04/03/2013   LASIK  2002   [facility]il   POLYPECTOMY     Past Medical History:  Diagnosis Date   Adenomatous polyp    Allergy    Atrial tachycardia ([place])    Diverticulosis    Dupuytren's disease    palms and soles   GERD (gastroesophageal reflux disease)    Hypertension    There were no vitals taken for this visit.  Opioid Risk Score:   Fall Risk Score:  `1  Depression screen PHQ 2/9  Depression screen [facility] 2/9 03/11/2021 12/06/2020 11/01/2020 08/30/2020 11/19/2017 11/06/2017 11/13/2016  Decreased Interest 0 0 0 0 0 0 0  Down, Depressed, Hopeless 0 0 0 0 0 0 0  PHQ - 2 Score 0 0 0 0 0 0 0  Altered sleeping - - 0 - 2 - 0  Tired, decreased energy - - 1 - 2 - 0  Change in appetite - - 0 - 0 - 0  Feeling bad or failure about yourself  - - 0 - 0 - 0  Trouble concentrating - - 0 - 0 - 0  Moving slowly or  fidgety/restless - - 0 - 0 - 0  Suicidal thoughts - - 0 - 0 - 0  PHQ-9 Score - - 1 - 4 - 0  Difficult doing work/chores - - - - Not difficult at all - Not difficult at all   Review of Systems  Constitutional: Negative.   HENT: Negative.    Eyes: Negative.   Respiratory: Negative.    Cardiovascular: Negative.   Gastrointestinal: Negative.   Endocrine: Negative.   Genitourinary: Negative.   Musculoskeletal:  Positive for gait problem.  Skin: Negative.   Allergic/Immunologic: Negative.   Hematological: Negative.   Psychiatric/Behavioral: Negative.        Objective:   Physical Exam  Gen: no distress, normal appearing HEENT: oral mucosa pink and moist, NCAT Cardio: Reg rate Chest: normal effort, normal rate of breathing Abd: soft, non-distended Ext: no edema Psych: pleasant, normal affect Skin: intact Neuro: HOH Motor: B/l LE: 5/5 throughout Sensation diminished to light touch b/l LE Vibration diminished in b/l LE knees and distally, intact UE (previously)    Assessment & Plan:  Male with pmh/psh ofpolyneuropathy, atrial tachycardia, back surgery (?laminectomy L4-5), left foot surgery (tumor removal x4), dupuytens (palms and soles), HTN, GERD presents for f/u b/l feet pain.  1. Chronic bilateral feet pain -   MRI foot report unremarkable.  MRI back report showing ? L5-S1 radic  NCS/EMG - showing polyneuropathy and L5-SI radic on left, results reviewed.   No benefit with Gabapentin  Trial Heat, reminded again  Will consider PT  Discussing purchasing TENS unit from [facility]  Trial OTC Lidocaine patch  Continue Lyrica 150 TID  Will consider referral to Neurosurg, pt would like to hold off at present  Will consider ESI, pt would like to hold off at present  Will consider PNS  Retry Capsacin, educated again  Wean the [place] as an option for neuropathic pain control. Discussed that this is a capsaicin patch, stronger than capsaicin cream. Discussed that  it is currently approved for diabetic peripheral neuropathy and post-herpetic neuralgia, but that it has also shown benefit in treating other forms of neuropathy. Provided patient with link to site to learn more about the patch: [employer].fr. Discussed that the patch would be placed in office and benefits usually last 3 months. Discussed that unintended exposure to capsaicin can cause severe irritation of eyes, mucous membranes, respiratory tract, and skin, but that Qutenza is a local treatment and does not have the systemic side effects of other nerve medications. Discussed that there may be pain, itching, erythema, and decreased sensory function associated with the application of Qutenza. Side effects usually subside within 1 week. A cold pack of analgesic medications can help with these side effects. Blood pressure can also be increased due to pain associated with administration of the patch.   4 patches of Qutenza was applied to the area of pain. Ice packs were applied during the procedure to ensure patient comfort. Blood pressure was monitored every 15 minutes. The patient tolerated the procedure well. Post-procedure instructions were given and follow-up has been scheduled.    Patient states main goal is to be pain free - improving  Educated on importance of caring for self. -Discussed current symptoms of pain and history of pain.  -Discussed benefits of exercise in reducing pain. -Discussed following foods that may reduce pain: 1) Ginger (especially studied for arthritis)- reduce leukotriene production to decrease inflammation 2) Blueberries- high in phytonutrients that decrease inflammation 3) Salmon- marine omega-3s reduce joint swelling and pain 4) Pumpkin seeds- reduce inflammation 5)  dark chocolate- reduces inflammation 6) turmeric- reduces inflammation 7) tart cherries - reduce pain and stiffness 8) extra virgin olive oil - its compound olecanthal helps to block prostaglandins   9) chili peppers- can be eaten or applied topically via capsaicin 10) mint- helpful for headache, muscle aches, joint pain, and itching 11) garlic- reduces inflammation  Link to further information on diet for chronic pain: http://www.randall.com/    2. Sleep disturbance  Discontinue amitriptyline  Improving  Insomnia: -Try to go outside near sunrise -Get exercise during the day.  -Turn off all devices an hour before bedtime.  -Teas that can benefit: chamomile, valerian root, Brahmi (Bacopa) -Can consider over the counter melatonin or magnesium glycinate (latter can also help with constipation) -Pistachios naturally increase the production of melatonin   Continue tylenol PM  3. Morbid Obesity  Gaining weight  Not interested in seeing dietitian  Educated again  4. Myalgia  Robaxin 500 TID PRN

## 2021-08-20 ENCOUNTER — Other Ambulatory Visit: Payer: Self-pay | Admitting: Family Medicine

## 2021-08-20 DIAGNOSIS — I10 Essential (primary) hypertension: Secondary | ICD-10-CM

## 2021-08-21 ENCOUNTER — Other Ambulatory Visit: Payer: Self-pay | Admitting: Family Medicine

## 2021-08-22 NOTE — Progress Notes (Signed)
Chronic Care Management Pharmacy Note  09/02/2021 Name:  Robert Page MRN:  [id number] DOB:  05-28-1946  Summary: Follow up visit - patient to start capsaicin patches for neuropathy in April.  LDL improved substantially Page statin therapy   Subjective: Robert Page is an 75 y.o. year old male who is a primary patient of [name], [name], MD.  The CCM team was consulted for assistance with disease management and care coordination needs.    Engaged with patient by telephone for follow up visit in response to provider referral for pharmacy case management and/or care coordination services.   Consent to Services:  The patient was given the following information about Chronic Care Management services today, agreed to services, and gave verbal consent: 1. CCM service includes personalized support from designated clinical staff supervised by the primary care provider, including individualized plan of care and coordination with other care providers 2. 24/7 contact phone numbers for assistance for urgent and routine care needs. 3. Service will only be billed when office clinical staff spend 20 minutes or more in a month to coordinate care. 4. Only one practitioner may furnish and bill the service in a calendar month. 5.The patient may stop CCM services at any time (effective at the end of the month) by phone call to the office staff. 6. The patient will be responsible for cost sharing (co-pay) of up to 20% of the service fee (after annual deductible is met). Patient agreed to services and consent obtained.  Patient Care Team: Robert Frizzle, MD as PCP - General (Family Medicine) Robert Booze, MD as PCP - Cardiology (Cardiology) Robert Grayer, MD as Attending Physician (Cardiology) Edythe Clarity, [facility] (Pharmacist)  Recent office visits:  04/22/21 Dr. [name] For illness. STARTED Amoxicillin-Pot Clavulanate 875-125 mg 1 tablet 2 times daily. STOPPED Methocarbamol and Prednisone.     Recent consult visits:  03/11/21 Physical Medicine and Rehabilitation. For follow-up. No medication changes.  Objective:  Lab Results  Component Value Date   CREATININE 1.07 02/24/2021   BUN 23 02/24/2021   GFR 74.13 12/19/2010   GFRNONAA 68 02/24/2021   GFRAA 79 02/24/2021   NA 142 02/24/2021   K 4.4 02/24/2021   CALCIUM 9.4 02/24/2021   CO2 30 02/24/2021   GLUCOSE 122 (H) 02/24/2021    Lab Results  Component Value Date/Time   HGBA1C 5.9 (H) 02/24/2021 11:28 AM   HGBA1C CANCELED 03/17/2019 02:40 PM   GFR 74.13 12/19/2010 08:44 AM    Last diabetic Eye exam: No results found for: HMDIABEYEEXA  Last diabetic Foot exam: No results found for: HMDIABFOOTEX   Lab Results  Component Value Date   CHOL 134 03/17/2021   HDL 51 03/17/2021   LDLCALC 64 03/17/2021   TRIG 105 03/17/2021   CHOLHDL 2.6 03/17/2021    Hepatic Function Latest Ref Rng & Units 03/17/2021 02/24/2021 08/30/2020  Total Protein 6.0 - 8.5 g/dL 6.6 6.5 7.0  Albumin 3.7 - 4.7 g/dL 4.4 - -  AST 0 - 40 IU/L _0 ALT 0 - 44 IU/L _1 Alk Phosphatase 44 - 121 IU/L 65 - -  Total Bilirubin 0.0 - 1.2 mg/dL 0.3 0.4 0.4  Bilirubin, Direct 0.00 - 0.40 mg/dL 0.11 - -    Lab Results  Component Value Date/Time   TSH 0.650 12/06/2020 10:00 AM   TSH 0.68 03/17/2019 02:40 PM    CBC Latest Ref Rng & Units 02/24/2021 08/30/2020 10/29/2017  WBC 3.8 -  10.8 Thousand/uL 6.3 7.8 8.0  Hemoglobin 13.2 - 17.1 g/dL 15.1 16.3 16.0  Hematocrit 38.5 - 50.0 % 44.7 47.2 47.3  Platelets 140 - 400 Thousand/uL 256 275 269    Lab Results  Component Value Date/Time   VD25OH 29.8 (L) 12/06/2020 10:00 AM    Clinical ASCVD: No  The 10-year ASCVD risk score (Arnett DK, et al., 2019) is: 20.5%   Values used to calculate the score:     Age: 75 years     Sex: Male     Is Non-Hispanic African American: No     Diabetic: No     Tobacco smoker: No     Systolic Blood Pressure: 272 mmHg     Is BP treated: Yes     HDL Cholesterol:  51 mg/dL     Total Cholesterol: 134 mg/dL    Depression screen [facility] 2/9 09/01/2021 08/09/2021 03/11/2021  Decreased Interest 0 0 0  Down, Depressed, Hopeless 0 0 0  PHQ - 2 Score 0 0 0  Altered sleeping - - -  Tired, decreased energy - - -  Change in appetite - - -  Feeling bad or failure about yourself  - - -  Trouble concentrating - - -  Moving slowly or fidgety/restless - - -  Suicidal thoughts - - -  PHQ-9 Score - - -  Difficult doing work/chores - - -     Social History   Tobacco Use  Smoking Status Former   Types: Cigars   Quit date: 2015   Years since quitting: 8.0  Smokeless Tobacco Never  Tobacco Comments   Pt states smokes occasional cigar.  This is typically < 1 time per month   BP Readings from Last 3 Encounters:  09/01/21 124/62  08/09/21 125/73  04/22/21 134/68   Pulse Readings from Last 3 Encounters:  09/01/21 71  08/09/21 67  04/22/21 72   Wt Readings from Last 3 Encounters:  08/09/21 230 lb 12.8 oz (104.7 kg)  04/22/21 230 lb (104.3 kg)  03/11/21 233 lb (105.7 kg)   BMI Readings from Last 3 Encounters:  08/09/21 33.59 kg/m  04/22/21 33.48 kg/m  03/11/21 33.91 kg/m    Assessment/Interventions: Review of patient past medical history, allergies, medications, health status, including review of consultants reports, laboratory and other test data, was performed as part of comprehensive evaluation and provision of chronic care management services.   SDOH:  (Social Determinants of Health) assessments and interventions performed: Yes  Financial Resource Strain: Low Risk    Difficulty of Paying Living Expenses: Not hard at all    SDOH Screenings   Alcohol Screen: Low Risk    Last Alcohol Screening Score (AUDIT): 0  Depression (PHQ2-9): Low Risk    PHQ-2 Score: 0  Financial Resource Strain: Low Risk    Difficulty of Paying Living Expenses: Not hard at all  Food Insecurity: No Food Insecurity   Worried About Charity fundraiser in the Last Year:  Never true   Ran Out of Food in the Last Year: Never true  Housing: Low Risk    Last Housing Risk Score: 0  Physical Activity: Sufficiently Active   Days of Exercise per Week: 5 days   Minutes of Exercise per Session: 30 min  Social Connections: Engineer, building services of Communication with Friends and Family: More than three times a week   Frequency of Social Gatherings with Friends and Family: Three times a week   Attends Religious Services: More  than 4 times per year   Active Member of Clubs or Organizations: Yes   Attends Music therapist: More than 4 times per year   Marital Status: Married  Stress: No Stress Concern Present   Feeling of Stress : Not at all  Tobacco Use: Medium Risk   Smoking Tobacco Use: Former   Smokeless Tobacco Use: Never   Passive Exposure: Not Page file  Transportation Needs: No Transportation Needs   Lack of Transportation (Medical): No   Lack of Transportation (Non-Medical): No    CCM Care Plan  Allergies  Allergen Reactions   Ibuprofen Other (See Comments)    stomach cramps  Other reaction(s): Unknown    Medications Reviewed Today     Reviewed by Edythe Clarity, [facility] (Pharmacist) Page 09/02/21 at 1223  Med List Status: <None>   Medication Order Taking? Sig Documenting Provider Last Dose Status Informant  amitriptyline (ELAVIL) 10 MG tablet [id number] Yes Take 0.5 tablets (5 mg total) by mouth at bedtime. Jamse Arn, MD Taking Active   amLODipine [facility]) 5 MG tablet [id number] Yes TAKE 1 TABLET BY MOUTH EVERY DAY Robert Frizzle, MD Taking Active   amoxicillin-clavulanate (AUGMENTIN) 875-125 MG tablet [id number] Yes Take 1 tablet by mouth 2 (two) times daily. Robert Frizzle, MD Taking Active   aspirin EC 81 MG tablet [id number] Yes Take 81 mg by mouth daily. [provider] Taking Active Self  Capsaicin 0.1 % CREA [id number] Yes Apply twice a day to feet Posey Pronto, Domenick Bookbinder, MD Taking Active    diphenhydramine-acetaminophen (TYLENOL PM) 25-500 MG TABS tablet [id number] Yes Take 1 tablet by mouth at bedtime as needed. [provider] Taking Active   fexofenadine (ALLEGRA) 180 MG tablet [id number] Yes Take 180 mg by mouth daily. [provider] Taking Active Self  fluticasone (FLONASE) 50 MCG/ACT nasal spray [id number] Yes SPRAY 2 SPRAYS INTO EACH NOSTRIL EVERY DAY Robert Frizzle, MD Taking Active   hydrochlorothiazide (HYDRODIURIL) 25 MG tablet [id number] Yes Take 1 tablet (25 mg total) by mouth daily. Robert Frizzle, MD Taking Active   meloxicam [facility]) 7.5 MG tablet [id number] Yes meloxicam 7.5 mg tablet  Take 1 tablet PO twice a day for 2 weeks and then PRN [provider] Taking Active   methocarbamol (ROBAXIN) 500 MG tablet [id number] Yes methocarbamol 500 mg tablet  TAKE 1 TABLET BY MOUTH EVERY 8 HOURS AS NEEDED FOR MUSCLE SPASMS. [provider] Taking Active   omeprazole (PRILOSEC) 20 MG capsule [id number] Yes Take 20 mg by mouth daily. [provider] Taking Active Self  pregabalin (LYRICA) 150 MG capsule [id number] Yes TAKE 1 CAPSULE BY MOUTH TWICE A DAY Bayard Hugger, NP Taking Active   rosuvastatin (CRESTOR) 10 MG tablet [id number] Yes Take 1 tablet (10 mg total) by mouth daily. Robert Booze, MD Taking Active   valsartan (DIOVAN) 160 MG tablet [id number] Yes TAKE 1 TABLET BY MOUTH EVERY DAY Robert Frizzle, MD Taking Active             Patient Active Problem List   Diagnosis Date Noted   Morbid obesity ([place]) 12/06/2020   Lumbosacral radiculopathy 11/01/2020   Sleep disturbance 11/01/2020   Polyneuropathy 09/24/2019   Dysesthesia 09/24/2019   Bilateral leg pain 09/24/2019   Chest pain, atypical 11/27/2017   Thyroid nodule 10/25/2015   Essential hypertension 03/14/2015   Cough 03/14/2015   Multiple pulmonary nodules 03/14/2015   Lower respiratory tract infection 03/14/2015   SVT (supraventricular  tachycardia)  ([place]) 11/03/2012   Fatigue 11/03/2012   GERD (gastroesophageal reflux disease) 10/24/2012   Plantar fasciitis 12/21/2010   Routine general medical examination at a health care facility 12/16/2010   Prostate cancer screening 12/16/2010   HAND PAIN, BILATERAL 11/26/2009   PERSONAL HX COLONIC POLYPS 10/26/2008   EXTERNAL HEMORRHOIDS 09/23/2008   HYPERTENSION 01/01/2007   Allergic rhinitis 01/01/2007   DIVERTICULOSIS, COLON 01/01/2007   DIVERTICULITIS, HX OF 01/01/2007    Immunization History  Administered Date(s) Administered   Fluad Quad(high Dose 65+) 08/10/2020   H1N1 09/23/2008   Influenza Split 06/04/2014   Influenza,inj,Quad PF,6+ Mos 08/08/2013, 05/17/2015, 10/17/2016   PFIZER(Purple Top)SARS-COV-2 Vaccination 09/25/2019, 10/16/2019, 08/30/2020   Pneumococcal Conjugate-13 10/17/2016   Pneumococcal Polysaccharide-23 03/11/2013   Td 03/04/2004   Tdap 10/17/2016    Conditions to be addressed/monitored:  HLD, Neuoropathy, HTN  Care Plan : General Pharmacy (Adult)  Updates made by Edythe Clarity, RPH since 09/02/2021 12:00 AM     Problem: HLD, Neuoropathy, HTN   Priority: High  Onset Date: 03/03/2021     Long-Range Goal: Patient-Specific Goal   Start Date: 03/03/2021  Expected End Date: 09/02/2021  Recent Progress: Page track  Priority: High  Note:   Current Barriers:  Peripheral neuropathy   Pharmacist Clinical Goal(s):  Patient will achieve adherence to monitoring guidelines and medication adherence to achieve therapeutic efficacy achieve control of cholesterol as evidenced by lab work adhere to prescribed medication regimen as evidenced by fill dates contact provider office for questions/concerns as evidenced notation of same in electronic health record through collaboration with PharmD and provider.   Interventions: 1:1 collaboration with Robert Frizzle, MD regarding development and update of comprehensive plan of care as evidenced by provider attestation  and co-signature Inter-disciplinary care team collaboration (see longitudinal plan of care) Comprehensive medication review performed; medication list updated in electronic medical record  Hypertension (BP goal <140/90) -Controlled -Current treatment: Amlodipine 24m daily HCTZ 273mdaily Valsartan 16029maily -Medications previously tried: Olmesartan  -Current home readings: not checking at home  -Denies hypotensive/hypertensive symptoms -Educated Page BP goals and benefits of medications for prevention of heart attack, stroke and kidney damage; Importance of home blood pressure monitoring; Symptoms of hypotension and importance of maintaining adequate hydration; -Counseled to monitor BP at home if symptomatic, document, and provide log at future appointments -Recommended to continue current medication  Hyperlipidemia: (LDL goal < 100) -Uncontrolled -Current treatment: Rosuvastatin 60m7mily -Medications previously tried: none noted  -Educated Page Cholesterol goals;  Benefits of statin for ASCVD risk reduction; Importance of limiting foods high in cholesterol; -Most recent LDL above goal, patient started statin after those results. -He has been tolerating well and reports 100% adherence -Recommended to continue current medication Will have labs checked next month with Dr. VaraIrish Lackpdate 09/02/21 LDL improved substantially since starting Crestor. Most recent LDL was 64 in July.  He continues to take Crestor daily with no concerns. Encouraged continued adherence. Recheck lipids at next CPE. No changes to meds at this time.  Neuropathy (Goal: Minimize symptoms) -Not ideally controlled -Current treatment  Lyrica 150mg65m Cymbalta 40mg 15my - plans to taper down to 20mg d49m -Medications previously tried: gabapentin -Pain still comes and goes, it is tolerable at the moment. -Discussed the need to taper off Cymbalta if he ever decided he wants to come off.  -Recommended  to continue current medication  Update 09/02/21 Patient is tapering down Page his Cymbalta currently at 40mg da76mwith plans to decrease  to 70m next week.  Hopes to taper off completely. He is going to try Qutenza patches in April with hopes of coming off all other medications completely.These are capsaicin patches that will last up to three months! No changes to meds at this time, continue tapering of Cymbalta.  Patient Goals/Self-Care Activities Patient will:  - take medications as prescribed focus Page medication adherence by pill count check blood pressure when symptomatic, document, and provide at future appointments   Follow Up Plan: The care management team will reach out to the patient again over the next 180 days.             Medication Assistance: None required.  Patient affirms current coverage meets needs.  Compliance/Adherence/Medication fill history: Care Gaps: Zoster vaccines    Patient's preferred pharmacy is:  [id number] [place] [address]: [phone]: [address] [address]one: [phone]: [phone] Uses pill box? Yes Pt endorses 100% compliance  We discussed: Benefits of medication synchronization, packaging and delivery as well as enhanced pharmacist oversight with [employer]. Patient decided to: Continue current medication management strategy  Care Plan and Follow Up Patient Decision:  Patient agrees to Care Plan and Follow-up.  Plan: The care management team will reach out to the patient again over the next 180 days.  ChBeverly MilchPharmD Clinical Pharmacist [phone]

## 2021-09-01 ENCOUNTER — Other Ambulatory Visit: Payer: Self-pay

## 2021-09-01 ENCOUNTER — Ambulatory Visit (INDEPENDENT_AMBULATORY_CARE_PROVIDER_SITE_OTHER): Payer: Medicare Other

## 2021-09-01 VITALS — BP 124/62 | HR 71

## 2021-09-01 DIAGNOSIS — Z0000 Encounter for general adult medical examination without abnormal findings: Secondary | ICD-10-CM | POA: Diagnosis not present

## 2021-09-01 NOTE — Patient Instructions (Signed)
[name] , Thank you for taking time to come for your Medicare Wellness Visit. I appreciate your ongoing commitment to your health goals. Please review the following plan we discussed and let me know if I can assist you in the future.   Screening recommendations/referrals: Colonoscopy: Done 05/29/2018 Repeat in 5 years  Recommended yearly ophthalmology/optometry visit for glaucoma screening and checkup Recommended yearly dental visit for hygiene and checkup  Vaccinations: Influenza vaccine: Due. Repeat annually  Pneumococcal vaccine: Done 03/11/2013 and 10/17/2016 Tdap vaccine: Done 10/17/2016 Repeat in 10 years  Shingles vaccine: Shingrix discussed. Please contact your pharmacy for coverage information.     Covid-19: Done 09/25/2019, 10/16/2019 and 08/30/2020.  Advanced directives: Advance directive discussed with you today. Even though you declined this today, please call our office should you change your mind, and we can give you the proper paperwork for you to fill out.   Conditions/risks identified: Aim for 30 minutes of exercise or brisk walking each day, drink 6-8 glasses of water and eat lots of fruits and vegetables.   Next appointment: Follow up in one year for your annual wellness visit. 2023.  Preventive Care 73 Years and Older, Male  Preventive care refers to lifestyle choices and visits with your health care provider that can promote health and wellness. What does preventive care include? A yearly physical exam. This is also called an annual well check. Dental exams once or twice a year. Routine eye exams. Ask your health care provider how often you should have your eyes checked. Personal lifestyle choices, including: Daily care of your teeth and gums. Regular physical activity. Eating a healthy diet. Avoiding tobacco and drug use. Limiting alcohol use. Practicing safe sex. Taking low doses of aspirin every day. Taking vitamin and mineral supplements as recommended  by your health care provider. What happens during an annual well check? The services and screenings done by your health care provider during your annual well check will depend on your age, overall health, lifestyle risk factors, and family history of disease. Counseling  Your health care provider may ask you questions about your: Alcohol use. Tobacco use. Drug use. Emotional well-being. Home and relationship well-being. Sexual activity. Eating habits. History of falls. Memory and ability to understand (cognition). Work and work Statistician. Screening  You may have the following tests or measurements: Height, weight, and BMI. Blood pressure. Lipid and cholesterol levels. These may be checked every 5 years, or more frequently if you are over 56 years old. Skin check. Lung cancer screening. You may have this screening every year starting at age 73 if you have a 30-pack-year history of smoking and currently smoke or have quit within the past 15 years. Fecal occult blood test (FOBT) of the stool. You may have this test every year starting at age 73. Flexible sigmoidoscopy or colonoscopy. You may have a sigmoidoscopy every 5 years or a colonoscopy every 10 years starting at age 73. Prostate cancer screening. Recommendations will vary depending on your family history and other risks. Hepatitis C blood test. Hepatitis B blood test. Sexually transmitted disease (STD) testing. Diabetes screening. This is done by checking your blood sugar (glucose) after you have not eaten for a while (fasting). You may have this done every 1-3 years. Abdominal aortic aneurysm (AAA) screening. You may need this if you are a current or former smoker. Osteoporosis. You may be screened starting at age 37 if you are at high risk. Talk with your health care provider about your test results,  treatment options, and if necessary, the need for more tests. Vaccines  Your health care provider may recommend certain  vaccines, such as: Influenza vaccine. This is recommended every year. Tetanus, diphtheria, and acellular pertussis (Tdap, Td) vaccine. You may need a Td booster every 10 years. Zoster vaccine. You may need this after age 23. Pneumococcal 13-valent conjugate (PCV13) vaccine. One dose is recommended after age 33. Pneumococcal polysaccharide (PPSV23) vaccine. One dose is recommended after age 101. Talk to your health care provider about which screenings and vaccines you need and how often you need them. This information is not intended to replace advice given to you by your health care provider. Make sure you discuss any questions you have with your health care provider. Document Released: 09/17/2015 Document Revised: 05/10/2016 Document Reviewed: 06/22/2015 Elsevier Interactive Patient Education  2017 [employer] Prevention in the Home Falls can cause injuries. They can happen to people of all ages. There are many things you can do to make your home safe and to help prevent falls. What can I do on the outside of my home? Regularly fix the edges of walkways and driveways and fix any cracks. Remove anything that might make you trip as you walk through a door, such as a raised step or threshold. Trim any bushes or trees on the path to your home. Use bright outdoor lighting. Clear any walking paths of anything that might make someone trip, such as rocks or tools. Regularly check to see if handrails are loose or broken. Make sure that both sides of any steps have handrails. Any raised decks and porches should have guardrails on the edges. Have any leaves, snow, or ice cleared regularly. Use sand or salt on walking paths during winter. Clean up any spills in your garage right away. This includes oil or grease spills. What can I do in the bathroom? Use night lights. Install grab bars by the toilet and in the tub and shower. Do not use towel bars as grab bars. Use non-skid mats or decals in  the tub or shower. If you need to sit down in the shower, use a plastic, non-slip stool. Keep the floor dry. Clean up any water that spills on the floor as soon as it happens. Remove soap buildup in the tub or shower regularly. Attach bath mats securely with double-sided non-slip rug tape. Do not have throw rugs and other things on the floor that can make you trip. What can I do in the bedroom? Use night lights. Make sure that you have a light by your bed that is easy to reach. Do not use any sheets or blankets that are too big for your bed. They should not hang down onto the floor. Have a firm chair that has side arms. You can use this for support while you get dressed. Do not have throw rugs and other things on the floor that can make you trip. What can I do in the kitchen? Clean up any spills right away. Avoid walking on wet floors. Keep items that you use a lot in easy-to-reach places. If you need to reach something above you, use a strong step stool that has a grab bar. Keep electrical cords out of the way. Do not use floor polish or wax that makes floors slippery. If you must use wax, use non-skid floor wax. Do not have throw rugs and other things on the floor that can make you trip. What can I do with my stairs? Do  not leave any items on the stairs. Make sure that there are handrails on both sides of the stairs and use them. Fix handrails that are broken or loose. Make sure that handrails are as long as the stairways. Check any carpeting to make sure that it is firmly attached to the stairs. Fix any carpet that is loose or worn. Avoid having throw rugs at the top or bottom of the stairs. If you do have throw rugs, attach them to the floor with carpet tape. Make sure that you have a light switch at the top of the stairs and the bottom of the stairs. If you do not have them, ask someone to add them for you. What else can I do to help prevent falls? Wear shoes that: Do not have high  heels. Have rubber bottoms. Are comfortable and fit you well. Are closed at the toe. Do not wear sandals. If you use a stepladder: Make sure that it is fully opened. Do not climb a closed stepladder. Make sure that both sides of the stepladder are locked into place. Ask someone to hold it for you, if possible. Clearly mark and make sure that you can see: Any grab bars or handrails. First and last steps. Where the edge of each step is. Use tools that help you move around (mobility aids) if they are needed. These include: Canes. Walkers. Scooters. Crutches. Turn on the lights when you go into a dark area. Replace any light bulbs as soon as they burn out. Set up your furniture so you have a clear path. Avoid moving your furniture around. If any of your floors are uneven, fix them. If there are any pets around you, be aware of where they are. Review your medicines with your doctor. Some medicines can make you feel dizzy. This can increase your chance of falling. Ask your doctor what other things that you can do to help prevent falls. This information is not intended to replace advice given to you by your health care provider. Make sure you discuss any questions you have with your health care provider. Document Released: 06/17/2009 Document Revised: 01/27/2016 Document Reviewed: 09/25/2014 Elsevier Interactive Patient Education  2017 [employer].

## 2021-09-01 NOTE — Progress Notes (Signed)
Subjective:   Robert Page is a 75 y.o. male who presents for Medicare Annual/Subsequent preventive examination. Virtual Visit via Telephone Note  I connected with  Robert Page on 09/01/21 at 12:00 PM EST by telephone and verified that I am speaking with the correct person using two identifiers.  Location: Patient: Home Provider: [facility]. Persons participating in the virtual visit: patient/Nurse Health Advisor   I discussed the limitations, risks, security and privacy concerns of performing an evaluation and management service by telephone and the availability of in person appointments. The patient expressed understanding and agreed to proceed.  Interactive audio and video telecommunications were attempted between this nurse and patient, however failed, due to patient having technical difficulties OR patient did not have access to video capability.  We continued and completed visit with audio only.  Some vital signs may be absent or patient reported.   Chriss Driver, LPN  Review of Systems     Cardiac Risk Factors include: advanced age (>60men, >18 women);hypertension;dyslipidemia;male gender     Objective:    Today's Vitals   09/01/21 1150 09/01/21 1153  BP: 124/62   Pulse: 71   SpO2: 97%   PainSc:  5    There is no height or weight on file to calculate BMI.  Advanced Directives 09/01/2021 08/30/2020 07/07/2020 07/08/2018  Does Patient Have a Medical Advance Directive? No Yes No No  Type of Advance Directive - [place]  Would patient like information on creating a medical advance directive? No - Patient declined - No - Patient declined -    Current Medications (verified) Outpatient Encounter Medications as of 09/01/2021  Medication Sig   amitriptyline (ELAVIL) 10 MG tablet Take 0.5 tablets (5 mg total) by mouth at bedtime.   amLODipine (NORVASC) 5 MG tablet TAKE 1 TABLET BY MOUTH EVERY DAY   amoxicillin-clavulanate (AUGMENTIN) 875-125 MG  tablet Take 1 tablet by mouth 2 (two) times daily.   aspirin EC 81 MG tablet Take 81 mg by mouth daily.   Capsaicin 0.1 % CREA Apply twice a day to feet   diphenhydramine-acetaminophen (TYLENOL PM) 25-500 MG TABS tablet Take 1 tablet by mouth at bedtime as needed.   fexofenadine (ALLEGRA) 180 MG tablet Take 180 mg by mouth daily.   fluticasone (FLONASE) 50 MCG/ACT nasal spray SPRAY 2 SPRAYS INTO EACH NOSTRIL EVERY DAY   hydrochlorothiazide (HYDRODIURIL) 25 MG tablet Take 1 tablet (25 mg total) by mouth daily.   meloxicam (MOBIC) 7.5 MG tablet meloxicam 7.5 mg tablet  Take 1 tablet PO twice a day for 2 weeks and then PRN   methocarbamol (ROBAXIN) 500 MG tablet methocarbamol 500 mg tablet  TAKE 1 TABLET BY MOUTH EVERY 8 HOURS AS NEEDED FOR MUSCLE SPASMS.   omeprazole (PRILOSEC) 20 MG capsule Take 20 mg by mouth daily.   pregabalin (LYRICA) 150 MG capsule TAKE 1 CAPSULE BY MOUTH TWICE A DAY   rosuvastatin (CRESTOR) 10 MG tablet Take 1 tablet (10 mg total) by mouth daily.   valsartan (DIOVAN) 160 MG tablet TAKE 1 TABLET BY MOUTH EVERY DAY   [DISCONTINUED] DULoxetine (CYMBALTA) 20 MG capsule Take 1 capsule (20 mg total) by mouth daily.   [DISCONTINUED] DULoxetine (CYMBALTA) 60 MG capsule TAKE 1 CAPSULE BY MOUTH EVERY DAY   No facility-administered encounter medications on file as of 09/01/2021.    Allergies (verified) Ibuprofen   History: Past Medical History:  Diagnosis Date   Adenomatous polyp    Allergy  Atrial tachycardia (HCC)    Diverticulosis    Dupuytren's disease    palms and soles   GERD (gastroesophageal reflux disease)    Hypertension    Past Surgical History:  Procedure Laterality Date   BACK SURGERY     12/2013   CARDIAC CATHETERIZATION  1990's   COLECTOMY  2010   for diverticulitis   COLON SURGERY N/A    [employer] 08/27/2020   COLONOSCOPY  04/03/2013   LASIK  2002   [facility]   POLYPECTOMY     Family History  Problem Relation Age of Onset   Hypertension Mother     Macular degeneration Mother    Kidney disease Mother    Dementia Mother    Hypertension Father    Melanoma Sister    Stroke Other        uncle   Prostate cancer Other        uncle   Colon cancer Other        grandmother/uncle   Kidney disease Other        grandmother   Liver cancer Other        uncle (mets)   Heart disease Other        aunt/uncle   Lung cancer Other        uncle   Stomach cancer Cousin    Heart disease Cousin    Colon cancer Paternal Grandmother    Colon cancer Paternal Uncle    Prostate cancer Paternal Uncle    Arthritis Brother    Hypertension Brother    Esophageal cancer Neg Hx    Rectal cancer Neg Hx    Social History   Socioeconomic History   Marital status: Married    Spouse name: Alcee Sipos   Number of children: 3   Years of education: Not on file   Highest education level: Not on file  Occupational History   Occupation: Retired  Tobacco Use   Smoking status: Former    Types: Cigars    Quit date: 2015    Years since quitting: 7.9   Smokeless tobacco: Never   Tobacco comments:    Pt states smokes occasional cigar.  This is typically < 1 time per month  Vaping Use   Vaping Use: Never used  Substance and Sexual Activity   Alcohol use: No    Alcohol/week: 0.0 standard drinks   Drug use: No   Sexual activity: Not on file  Other Topics Concern   Not on file  Social History Narrative   Pt lives in [place].     Retired from [employer] and [employer].     Goes on mission trips to [place], gets regular exercise.   Caffeine use: very rare, Diet coke   Right handed    Social Determinants of Health   Financial Resource Strain: Low Risk    Difficulty of Paying Living Expenses: Not hard at all  Food Insecurity: No Food Insecurity   Worried About Charity fundraiser in the Last Year: Never true   Ran Out of Food in the Last Year: Never true  Transportation Needs: No Transportation Needs   Lack of Transportation (Medical): No   Lack  of Transportation (Non-Medical): No  Physical Activity: Sufficiently Active   Days of Exercise per Week: 5 days   Minutes of Exercise per Session: 30 min  Stress: No Stress Concern Present   Feeling of Stress : Not at all  Social Connections: Socially Integrated   Frequency of  Communication with Friends and Family: More than three times a week   Frequency of Social Gatherings with Friends and Family: Three times a week   Attends Religious Services: More than 4 times per year   Active Member of Clubs or Organizations: Yes   Attends Archivist Meetings: More than 4 times per year   Marital Status: Married    Tobacco Counseling Counseling given: Not Answered Tobacco comments: Pt states smokes occasional cigar.  This is typically < 1 time per month   Clinical Intake:  Pre-visit preparation completed: Yes  Pain : 0-10 Pain Score: 5  Pain Type: Acute pain, Other (Comment) (S/P fall and injured L foot about 2 weeks ago.) Pain Location: Foot Pain Descriptors / Indicators: Aching Pain Onset: 1 to 4 weeks ago Pain Frequency: Intermittent     Diabetes: No  How often do you need to have someone help you when you read instructions, pamphlets, or other written materials from your doctor or pharmacy?: 1 - Never  Diabetic?No  Interpreter Needed?: No  Information entered by :: MJ Aronda Burford, LPN   Activities of Daily Living In your present state of health, do you have any difficulty performing the following activities: 09/01/2021 08/28/2021  Hearing? N N  Vision? N N  Difficulty concentrating or making decisions? N N  Walking or climbing stairs? N N  Dressing or bathing? N N  Doing errands, shopping? N N  Preparing Food and eating ? N N  Using the Toilet? N N  In the past six months, have you accidently leaked urine? N N  Do you have problems with loss of bowel control? N N  Managing your Medications? N N  Managing your Finances? N N  Housekeeping or managing your  Housekeeping? N N  Some recent data might be hidden    Patient Care Team: Susy Frizzle, MD as PCP - General (Family Medicine) Jettie Booze, MD as PCP - Cardiology (Cardiology) Thompson Grayer, MD as Attending Physician (Cardiology) Edythe Clarity, [facility] (Pharmacist)  Indicate any recent Medical Services you may have received from other than [facility] providers in the past year (date may be approximate).     Assessment:   This is a routine wellness examination for [employer].  Hearing/Vision screen Hearing Screening - Comments:: Some hearing issues.  Vision Screening - Comments:: Lasix surgery at [facility].   Dietary issues and exercise activities discussed: Current Exercise Habits: The patient has a physically strenuous job, but has no regular exercise apart from work., Exercise limited by: cardiac condition(s)   Goals Addressed             This Visit's Progress    DIET - [place]       Pt would like to lose weight.       Depression Screen PHQ 2/9 Scores 09/01/2021 08/09/2021 03/11/2021 12/06/2020 11/01/2020 08/30/2020 11/19/2017  PHQ - 2 Score 0 0 0 0 0 0 0  PHQ- 9 Score - - - - 1 - 4    Fall Risk Fall Risk  09/01/2021 08/28/2021 08/09/2021 03/11/2021 01/27/2021  Falls in the past year? 1 1 1  0 0  Comment - - - - -  Number falls in past yr: 1 1 0 0 -  Injury with Fall? 1 1 1  0 -  Risk for fall due to : History of fall(s);Impaired balance/gait - - - -  Follow up Falls prevention discussed - - - -    FALL RISK  PREVENTION PERTAINING TO THE HOME:  Any stairs in or around the home? Yes  If so, are there any without handrails? No  Home free of loose throw rugs in walkways, pet beds, electrical cords, etc? Yes  Adequate lighting in your home to reduce risk of falls? Yes   ASSISTIVE DEVICES UTILIZED TO PREVENT FALLS:  Life alert? Yes  Use of a cane, walker or w/c? Yes  Grab bars in the bathroom? No  Shower chair or bench in shower? No   Elevated toilet seat or a handicapped toilet? No   TIMED UP AND GO:  Was the test performed? Yes .  Length of time to ambulate 10 feet: 10 sec.   Gait steady and fast without use of assistive device  Cognitive Function: Normal cognitive status assessed by direct observation by this Nurse Health Advisor. No abnormalities found.       6CIT Screen 08/30/2020  What Year? 0 points  What month? 0 points  What time? 0 points  Count back from 20 0 points  Months in reverse 0 points  Repeat phrase 0 points  Total Score 0    Immunizations Immunization History  Administered Date(s) Administered   Fluad Quad(high Dose 65+) 08/10/2020   H1N1 09/23/2008   Influenza Split 06/04/2014   Influenza,inj,Quad PF,6+ Mos 08/08/2013, 05/17/2015, 10/17/2016   PFIZER(Purple Top)SARS-COV-2 Vaccination 09/25/2019, 10/16/2019, 08/30/2020   Pneumococcal Conjugate-13 10/17/2016   Pneumococcal Polysaccharide-23 03/11/2013   Td 03/04/2004   Tdap 10/17/2016    TDAP status: Up to date  Flu Vaccine status: Due, Education has been provided regarding the importance of this vaccine. Advised may receive this vaccine at local pharmacy or Health Dept. Aware to provide a copy of the vaccination record if obtained from local pharmacy or Health Dept. Verbalized acceptance and understanding.  Pneumococcal vaccine status: Up to date  Covid-19 vaccine status: Completed vaccines  Qualifies for Shingles Vaccine? Yes   Zostavax completed No   Shingrix Completed?: No.    Education has been provided regarding the importance of this vaccine. Patient has been advised to call insurance company to determine out of pocket expense if they have not yet received this vaccine. Advised may also receive vaccine at local pharmacy or Health Dept. Verbalized acceptance and understanding.  Screening Tests Health Maintenance  Topic Date Due   Zoster Vaccines- Shingrix (1 of 2) Never done   COVID-19 Vaccine (4 - Booster for Pfizer  series) 10/25/2020   INFLUENZA VACCINE  04/04/2021   Hepatitis C Screening  12/31/2021 (Originally 09/20/1963)   COLONOSCOPY (Pts 45-16yrs Insurance coverage will need to be confirmed)  05/30/2023   TETANUS/TDAP  10/17/2026   Pneumonia Vaccine 9+ Years old  Completed   HPV VACCINES  Aged Out    Health Maintenance  Health Maintenance Due  Topic Date Due   Zoster Vaccines- Shingrix (1 of 2) Never done   COVID-19 Vaccine (4 - Booster for Pfizer series) 10/25/2020   INFLUENZA VACCINE  04/04/2021    Colorectal cancer screening: Type of screening: Colonoscopy. Completed 05/29/2018. Repeat every 5 years  Lung Cancer Screening: (Low Dose CT Chest recommended if Age 39-80 years, 30 pack-year currently smoking OR have quit w/in 15years.) does not qualify.    Additional Screening:  Hepatitis C Screening: does qualify; Completed Due.  Vision Screening: Recommended annual ophthalmology exams for early detection of glaucoma and other disorders of the eye. Is the patient up to date with their annual eye exam?  No  Who is the provider or  what is the name of the office in which the patient attends annual eye exams? [facility] If pt is not established with a provider, would they like to be referred to a provider to establish care? No .   Dental Screening: Recommended annual dental exams for proper oral hygiene  Community Resource Referral / Chronic Care Management: CRR required this visit?  No   CCM required this visit?  No      Plan:     I have personally reviewed and noted the following in the patients chart:   Medical and social history Use of alcohol, tobacco or illicit drugs  Current medications and supplements including opioid prescriptions. Patient is not currently taking opioid prescriptions. Functional ability and status Nutritional status Physical activity Advanced directives List of other physicians Hospitalizations, surgeries, and ER visits in previous 12  months Vitals Screenings to include cognitive, depression, and falls Referrals and appointments  In addition, I have reviewed and discussed with patient certain preventive protocols, quality metrics, and best practice recommendations. A written personalized care plan for preventive services as well as general preventive health recommendations were provided to patient.     Chriss Driver, LPN   68/34/1960   Nurse Notes: Pt is up to date on colonoscopy. Overdue for eye exam, pt states he is doing well since Lasix surgery. Discussed Shingles and flu vaccines and how to obtain.

## 2021-09-02 ENCOUNTER — Ambulatory Visit (INDEPENDENT_AMBULATORY_CARE_PROVIDER_SITE_OTHER): Payer: Medicare Other | Admitting: Pharmacist

## 2021-09-02 DIAGNOSIS — G629 Polyneuropathy, unspecified: Secondary | ICD-10-CM

## 2021-09-02 DIAGNOSIS — I10 Essential (primary) hypertension: Secondary | ICD-10-CM

## 2021-09-02 NOTE — Patient Instructions (Addendum)
Visit Information   Goals Addressed             This Visit's Progress    Track and Manage My Blood Pressure-Hypertension   On track    Timeframe:  Long-Range Goal Priority:  High Start Date:  03/03/21                           Expected End Date: 09/02/21                      Follow Up Date 06/03/21    - check blood pressure 3 times per week - choose a place to take my blood pressure (home, clinic or office, retail store) - write blood pressure results in a log or diary    Why is this important?   You won't feel high blood pressure, but it can still hurt your blood vessels.  High blood pressure can cause heart or kidney problems. It can also cause a stroke.  Making lifestyle changes like losing a little weight or eating less salt will help.  Checking your blood pressure at home and at different times of the day can help to control blood pressure.  If the doctor prescribes medicine remember to take it the way the doctor ordered.  Call the office if you cannot afford the medicine or if there are questions about it.     Notes:        Patient Care Plan: General Pharmacy (Adult)     Problem Identified: HLD, Neuoropathy, HTN   Priority: High  Onset Date: 03/03/2021     Long-Range Goal: Patient-Specific Goal   Start Date: 03/03/2021  Expected End Date: 09/02/2021  Recent Progress: On track  Priority: High  Note:   Current Barriers:  Peripheral neuropathy   Pharmacist Clinical Goal(s):  Patient will achieve adherence to monitoring guidelines and medication adherence to achieve therapeutic efficacy achieve control of cholesterol as evidenced by lab work adhere to prescribed medication regimen as evidenced by fill dates contact provider office for questions/concerns as evidenced notation of same in electronic health record through collaboration with PharmD and provider.   Interventions: 1:1 collaboration with Susy Frizzle, MD regarding development and update of  comprehensive plan of care as evidenced by provider attestation and co-signature Inter-disciplinary care team collaboration (see longitudinal plan of care) Comprehensive medication review performed; medication list updated in electronic medical record  Hypertension (BP goal <140/90) -Controlled -Current treatment: Amlodipine 5mg  daily HCTZ 25mg  daily Valsartan 160mg  daily -Medications previously tried: Olmesartan  -Current home readings: not checking at home  -Denies hypotensive/hypertensive symptoms -Educated on BP goals and benefits of medications for prevention of heart attack, stroke and kidney damage; Importance of home blood pressure monitoring; Symptoms of hypotension and importance of maintaining adequate hydration; -Counseled to monitor BP at home if symptomatic, document, and provide log at future appointments -Recommended to continue current medication  Hyperlipidemia: (LDL goal < 100) -Uncontrolled -Current treatment: Rosuvastatin 10mg  daily -Medications previously tried: none noted  -Educated on Cholesterol goals;  Benefits of statin for ASCVD risk reduction; Importance of limiting foods high in cholesterol; -Most recent LDL above goal, patient started statin after those results. -He has been tolerating well and reports 100% adherence -Recommended to continue current medication Will have labs checked next month with Dr. Irish Lack.  Update 09/02/21 LDL improved substantially since starting Crestor. Most recent LDL was 64 in July.  He continues to take Crestor daily  with no concerns. Encouraged continued adherence. Recheck lipids at next CPE. No changes to meds at this time.  Neuropathy (Goal: Minimize symptoms) -Not ideally controlled -Current treatment  Lyrica 150mg  Bid Cymbalta 40mg  daily - plans to taper down to 20mg  daily -Medications previously tried: gabapentin -Pain still comes and goes, it is tolerable at the moment. -Discussed the need to taper off  Cymbalta if he ever decided he wants to come off.  -Recommended to continue current medication  Update 09/02/21 Patient is tapering down on his Cymbalta currently at 40mg  daily with plans to decrease to 20mg  next week.  Hopes to taper off completely. He is going to try Qutenza patches in April with hopes of coming off all other medications completely.These are capsaicin patches that will last up to three months! No changes to meds at this time, continue tapering of Cymbalta.  Patient Goals/Self-Care Activities Patient will:  - take medications as prescribed focus on medication adherence by pill count check blood pressure when symptomatic, document, and provide at future appointments   Follow Up Plan: The care management team will reach out to the patient again over the next 180 days.           Patient verbalizes understanding of instructions provided today and agrees to view in [place].  Telephone follow up appointment with pharmacy team member scheduled for: 6 months  Edythe Clarity, [place], PharmD, [place] Clinical Pharmacist Practitioner [place] [phone]

## 2021-09-03 DIAGNOSIS — E785 Hyperlipidemia, unspecified: Secondary | ICD-10-CM

## 2021-09-03 DIAGNOSIS — I10 Essential (primary) hypertension: Secondary | ICD-10-CM

## 2021-09-03 DIAGNOSIS — G629 Polyneuropathy, unspecified: Secondary | ICD-10-CM | POA: Diagnosis not present

## 2021-09-06 ENCOUNTER — Telehealth: Payer: Self-pay

## 2021-09-06 ENCOUNTER — Other Ambulatory Visit: Payer: Self-pay | Admitting: Family Medicine

## 2021-09-06 MED ORDER — NIRMATRELVIR/RITONAVIR (PAXLOVID)TABLET: 3.0000 | ORAL_TABLET | Freq: Two times a day (BID) | ORAL | 0 refills | Status: AC

## 2021-09-06 NOTE — Telephone Encounter (Signed)
Pt aware of meds 

## 2021-09-06 NOTE — Telephone Encounter (Signed)
Tested pos on Thursday  Retested Monday -   S/s now- sweats, hot, cough - like a bad chest cold.  No nasal drainage   Cough is biggest aggravation - [facility]

## 2021-09-06 NOTE — Telephone Encounter (Signed)
Pt called in stating that he is still recovering from Covid, but is still experiencing symptoms. Pt would like to know if pcp would send some meds to the pharmacy to help with symptoms that are still lingering.   Cb#: [phone]

## 2021-09-09 ENCOUNTER — Other Ambulatory Visit: Payer: Self-pay | Admitting: Registered Nurse

## 2021-09-12 ENCOUNTER — Other Ambulatory Visit: Payer: Self-pay | Admitting: Registered Nurse

## 2021-09-15 ENCOUNTER — Ambulatory Visit: Payer: Medicare Other | Admitting: Physical Medicine and Rehabilitation

## 2021-09-28 ENCOUNTER — Other Ambulatory Visit: Payer: Self-pay | Admitting: Interventional Cardiology

## 2021-11-17 ENCOUNTER — Other Ambulatory Visit: Payer: Self-pay

## 2021-11-17 ENCOUNTER — Other Ambulatory Visit: Payer: Self-pay | Admitting: Family Medicine

## 2021-11-22 ENCOUNTER — Encounter
Payer: Medicare Other | Attending: Physical Medicine and Rehabilitation | Admitting: Physical Medicine and Rehabilitation

## 2021-11-22 ENCOUNTER — Other Ambulatory Visit: Payer: Self-pay

## 2021-11-22 ENCOUNTER — Encounter: Payer: Self-pay | Admitting: Physical Medicine and Rehabilitation

## 2021-11-22 VITALS — BP 132/69 | HR 64 | Ht 69.5 in | Wt 237.8 lb

## 2021-11-22 DIAGNOSIS — G479 Sleep disorder, unspecified: Secondary | ICD-10-CM | POA: Diagnosis not present

## 2021-11-22 DIAGNOSIS — G629 Polyneuropathy, unspecified: Secondary | ICD-10-CM

## 2021-11-22 NOTE — Progress Notes (Signed)
? ?Subjective:  ? ? Patient ID: Robert Page, male    DOB: 05/17/1946, 76 y.o.   MRN: [id number] ? ?HPI ?Male with pmh/psh of polyneuropathy, atrial tachycardia, back surgery (?laminectomy L4-5), left foot surgery (tumor removal x4), dupuytens (palms and soles), HTN, GERD presents for follow-up of bilateral neuropathy.  ?Initially stated: Started summer 2000.  He had foot surgery in left foot in ~2016. Progressing.  Radiating proximally.  No symptoms in hands.  Ambulating and activity improves the pain. Shoes and inactivity exacerbate the pain. Hot and tingling/burning.  He notes I am the 6th doctor he's been to. He saw PCP and Ortho, who did MRI and told non-surgical.  He was referred to Rheum, who did not believe it was RA and thought it was L4-5 radic.  He was referred to Neurology, who ordered NCV/EMG (showing polyneuropathy, left peroneal neuropathy, Left L5/S1 radic) and MRI.  MRI C-spine showing mild disc bulges C3-6. He had MRI L-spine.  He saw Neurosurg, who did Myelogram and was told symptoms not originating in back.  Constant.  Associated numbness.  Gabapentin, Lyrica, Cymbalta did not help. He was told he has tried "every single medication for neuropathy" without benefit. He notes improvement when he sleeps in a different bed.  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 Pain does not limit activities.  ? ?Since that time, patient states he has not tried heat. He states he had burning with Capsacin. He obtained Vitamin levels. Sleep has improved.  Patient states he was losing weight, but gained in on vacation. Patient states he often does not have time to take care of his body, take meds consistently, etc.  ? ?-He is taking Lyrica and Cymbalta. He has tightness and stiffness in both lower extremities. He had a dose of steroid dose pack that did provide some relief to his back pain. If he is barefoot in the shower  he feels off balance. He saw Dr. Doran Durand at [facility]. He has had prior foot surgery- was told he had arthritis. He saw rheumatology and was told he does not have rheumatoid arthritis. He had EMG/NCS (results above).  ? ?Both feet are really stiff ?He is excited to try Qutenza today ?Pain can be very severe at night ?It negatively affects his sleep ?He feels burning pain worst on tops of both feet but also present in soles ?He also has pain radiating from his back into his right shin ?He is ok on his medicines- no refills needed right now ?He is not sure if Lyrica or Cymbalta are helping. ?He does not want to take things that are not needed ? ?Pain Inventory ?Average Pain 5 ?Pain Right Now 3 ?My pain is burning at times, stabbing, aching ? ?In the last 24 hours, has pain interfered with the following? ?General activity 1 ?Relation with others 2 ?Enjoyment of life 4 ?What TIME of day is your pain at its worst? Daytime and evening ?Sleep (in general) Good ? ?Pain is worse with: sitting, standing, some activities ?Pain improves with: rest, possibly medication ?Relief from Meds: 1 ? ? ?Family History  ?Problem Relation Age of Onset  ? Hypertension Mother   ? Macular degeneration Mother   ? Kidney disease Mother   ? Dementia Mother   ? Hypertension Father   ? Melanoma Sister   ? Stroke Other   ?  uncle  ? Prostate cancer Other   ?     uncle  ? Colon cancer Other   ?     grandmother/uncle  ? Kidney disease Other   ?     grandmother  ? Liver cancer Other   ?     uncle (mets)  ? Heart disease Other   ?     aunt/uncle  ? Lung cancer Other   ?     uncle  ? Stomach cancer Cousin   ? Heart disease Cousin   ? Colon cancer Paternal Grandmother   ? Colon cancer Paternal Uncle   ? Prostate cancer Paternal Uncle   ? Arthritis Brother   ? Hypertension Brother   ? Esophageal cancer Neg Hx   ? Rectal cancer Neg Hx   ? ?Social History  ? ?Socioeconomic History  ? Marital status: Married  ?  Spouse name: Bradshaw Minihan  ?  Number of children: 3  ? Years of education: Not on file  ? Highest education level: Not on file  ?Occupational History  ? Occupation: Retired  ?Tobacco Use  ? Smoking status: Former  ?  Types: Cigars  ?  Quit date: 2015  ?  Years since quitting: 8.2  ? Smokeless tobacco: Never  ? Tobacco comments:  ?  Pt states smokes occasional cigar.  This is typically < 1 time per month  ?Vaping Use  ? Vaping Use: Never used  ?Substance and Sexual Activity  ? Alcohol use: No  ?  Alcohol/week: 0.0 standard drinks  ? Drug use: No  ? Sexual activity: Not on file  ?Other Topics Concern  ? Not on file  ?Social History Narrative  ? Pt lives in [place].   ?  Retired from [employer] and [employer].   ?  Goes on mission trips to [place], gets regular exercise.  ? Caffeine use: very rare, Diet coke  ? Right handed   ? ?Social Determinants of Health  ? ?Financial Resource Strain: Low Risk   ? Difficulty of Paying Living Expenses: Not hard at all  ?Food Insecurity: No Food Insecurity  ? Worried About Charity fundraiser in the Last Year: Never true  ? Ran Out of Food in the Last Year: Never true  ?Transportation Needs: No Transportation Needs  ? Lack of Transportation (Medical): No  ? Lack of Transportation (Non-Medical): No  ?Physical Activity: Sufficiently Active  ? Days of Exercise per Week: 5 days  ? Minutes of Exercise per Session: 30 min  ?Stress: No Stress Concern Present  ? Feeling of Stress : Not at all  ?Social Connections: Socially Integrated  ? Frequency of Communication with Friends and Family: More than three times a week  ? Frequency of Social Gatherings with Friends and Family: Three times a week  ? Attends Religious Services: More than 4 times per year  ? Active Member of Clubs or Organizations: Yes  ? Attends Archivist Meetings: More than 4 times per year  ? Marital Status: Married  ? ?Past Surgical History:  ?Procedure Laterality Date  ? BACK SURGERY    ? 12/2013  ? CARDIAC CATHETERIZATION  1990's  ? COLECTOMY   2010  ? for diverticulitis  ? COLON SURGERY N/A   ? [employer] 08/27/2020  ? COLONOSCOPY  04/03/2013  ? LASIK  2002  ? Bil  ? POLYPECTOMY    ? ?Past Medical History:  ?Diagnosis Date  ? Adenomatous polyp   ? Allergy   ?  Atrial tachycardia ([place])   ? Diverticulosis   ? Dupuytren's disease   ? palms and soles  ? GERD (gastroesophageal reflux disease)   ? Hypertension   ? ?BP 132/69   Pulse 64   Ht 5' 9.5" (1.765 m)   Wt 237 lb 12.8 oz (107.9 kg)   SpO2 94%   BMI 34.61 kg/m?  ? ?Opioid Risk Score:   ?Fall Risk Score:  `1 ? ?Depression screen PHQ 2/9 ? ?Depression screen [facility] 2/9 11/22/2021 09/01/2021 08/09/2021 03/11/2021 12/06/2020 11/01/2020 08/30/2020  ?Decreased Interest 0 0 0 0 0 0 0  ?Down, Depressed, Hopeless 0 0 0 0 0 0 0  ?PHQ - 2 Score 0 0 0 0 0 0 0  ?Altered sleeping - - - - - 0 -  ?Tired, decreased energy - - - - - 1 -  ?Change in appetite - - - - - 0 -  ?Feeling bad or failure about yourself  - - - - - 0 -  ?Trouble concentrating - - - - - 0 -  ?Moving slowly or fidgety/restless - - - - - 0 -  ?Suicidal thoughts - - - - - 0 -  ?PHQ-9 Score - - - - - 1 -  ?Difficult doing work/chores - - - - - - -  ? ?Review of Systems  ?Constitutional: Negative.   ?HENT: Negative.    ?Eyes: Negative.   ?Respiratory: Negative.    ?Cardiovascular: Negative.   ?Gastrointestinal: Negative.   ?Endocrine: Negative.   ?Genitourinary: Negative.   ?Musculoskeletal:  Positive for gait problem.  ?Skin: Negative.   ?Allergic/Immunologic: Negative.   ?Hematological: Negative.   ?Psychiatric/Behavioral: Negative.    ?   ?Objective:  ? Physical Exam  ?Gen: no distress, normal appearing ?HEENT: oral mucosa pink and moist, NCAT ?Cardio: Reg rate ?Chest: normal effort, normal rate of breathing ?Abd: soft, non-distended ?Ext: no edema ?Psych: pleasant, normal affect ?Skin: intact, no open lesions on both feet ?Neuro: ?HOH ?Motor: B/l LE: 5/5 throughout ?Sensation diminished to light touch b/l LE ?Vibration diminished in b/l LE knees and distally,  intact UE (previously) ?   ?Assessment & Plan:  ?Male with pmh/psh ofpolyneuropathy, atrial tachycardia, back surgery (?laminectomy L4-5), left foot surgery (tumor removal x4), dupuytens (palms and soles), HTN, G

## 2021-11-22 NOTE — Patient Instructions (Signed)

## 2021-12-21 ENCOUNTER — Telehealth: Payer: Self-pay | Admitting: Pharmacist

## 2021-12-21 NOTE — Progress Notes (Signed)
? ? ?  Chronic Care Management ?Pharmacy Assistant  ? ?Name: Robert Page  MRN: [id number] DOB: 02-22-1946 ? ? ?Reason for Encounter: Disease State - General Adherence Call ?  ? ?Recent office visits:  ?None noted.  ? ?Recent consult visits:  ?11/22/21 Caprice Renshaw MD - Physical Medicine - Polyneuropathy - No medication changes. May try melatonin or natural remedies for better sleep. Follow up in 3 months.  ? ?Hospital visits:  ?None in previous 6 months ? ?Medications: ?Outpatient Encounter Medications as of 12/21/2021  ?Medication Sig  ? amitriptyline (ELAVIL) 10 MG tablet Take 0.5 tablets (5 mg total) by mouth at bedtime.  ? amLODipine (NORVASC) 5 MG tablet TAKE 1 TABLET BY MOUTH EVERY DAY  ? aspirin EC 81 MG tablet Take 81 mg by mouth daily.  ? Capsaicin 0.1 % CREA Apply twice a day to feet  ? diphenhydramine-acetaminophen (TYLENOL PM) 25-500 MG TABS tablet Take 1 tablet by mouth at bedtime as needed.  ? DULoxetine (CYMBALTA) 20 MG capsule Take 20 mg by mouth daily.  ? fexofenadine (ALLEGRA) 180 MG tablet Take 180 mg by mouth daily.  ? fluticasone (FLONASE) 50 MCG/ACT nasal spray SPRAY 2 SPRAYS INTO EACH NOSTRIL EVERY DAY  ? hydrochlorothiazide (HYDRODIURIL) 25 MG tablet TAKE 1 TABLET (25 MG TOTAL) BY MOUTH DAILY.  ? meloxicam (MOBIC) 7.5 MG tablet meloxicam 7.5 mg tablet ? Take 1 tablet PO twice a day for 2 weeks and then PRN  ? methocarbamol (ROBAXIN) 500 MG tablet methocarbamol 500 mg tablet ? TAKE 1 TABLET BY MOUTH EVERY 8 HOURS AS NEEDED FOR MUSCLE SPASMS.  ? omeprazole (PRILOSEC) 20 MG capsule Take 20 mg by mouth daily.  ? pregabalin (LYRICA) 150 MG capsule TAKE 1 CAPSULE BY MOUTH TWICE A DAY  ? rosuvastatin (CRESTOR) 10 MG tablet Take 1 tablet (10 mg total) by mouth daily. Please schedule yearly appointment for future refills. 1 st attempt. Thank you  ? valsartan (DIOVAN) 160 MG tablet TAKE 1 TABLET BY MOUTH EVERY DAY  ? ?No facility-administered encounter medications on file as of 12/21/2021.  ? ? ?Have  you had any problems recently with your health? ? ? ?Have you had any problems with your pharmacy? ? ? ?What issues or side effects are you having with your medications? ? ? ?What would you like me to pass along to Leata Mouse, CPP for them to help you with?  ?How are New patches working? ? ?What can we do to take care of you better? ? ? ?Care Gaps ? ?AWV: done 09/01/21 ?Colonoscopy: done 05/29/18 ?DM Eye Exam: N/A ?DM Foot Exam: N/A ?Microalbumin: unknown ?HbgAIC: done 02/24/21 (5.9) ?DEXA: N/A ?Mammogram: N/A ? ? ?Star Rating Drugs: ?Valsartan (DIOVAN) 160 MG tablet - last filled 08/22/21 90 days  ? ? ?Future Appointments  ?Date Time Provider [place]  ?02/28/2022 11:20 AM Raulkar, Clide Deutscher, MD [facility] CPR  ?03/09/2022 12:30 PM [facility]-CCM PHARMACIST [facility]  ?05/23/2022 11:20 AM Raulkar, Clide Deutscher, MD [facility] CPR  ?08/14/2022  1:40 PM Raulkar, Clide Deutscher, MD [facility] CPR  ?09/07/2022 12:00 PM B[facility]-NURSE HEALTH ADVISOR [facility]  ? ?Multiple attempts were made to contact patient. Attempts were unsuccessful. / ls,CMA  ? ? ?Liza Showfety, CCMA ?Clinical Pharmacist Assistant  ?[phone] ? ? ?

## 2021-12-22 ENCOUNTER — Other Ambulatory Visit: Payer: Self-pay | Admitting: Physical Medicine and Rehabilitation

## 2022-01-12 ENCOUNTER — Other Ambulatory Visit: Payer: Self-pay | Admitting: Interventional Cardiology

## 2022-01-26 ENCOUNTER — Other Ambulatory Visit: Payer: Self-pay | Admitting: Interventional Cardiology

## 2022-01-30 ENCOUNTER — Other Ambulatory Visit: Payer: Self-pay | Admitting: Physical Medicine and Rehabilitation

## 2022-02-09 ENCOUNTER — Other Ambulatory Visit: Payer: Self-pay | Admitting: Interventional Cardiology

## 2022-02-28 ENCOUNTER — Encounter
Payer: Medicare Other | Attending: Physical Medicine and Rehabilitation | Admitting: Physical Medicine and Rehabilitation

## 2022-02-28 VITALS — BP 130/74 | HR 64 | Ht 69.5 in | Wt 234.8 lb

## 2022-02-28 DIAGNOSIS — G629 Polyneuropathy, unspecified: Secondary | ICD-10-CM | POA: Diagnosis present

## 2022-02-28 DIAGNOSIS — G479 Sleep disorder, unspecified: Secondary | ICD-10-CM | POA: Diagnosis present

## 2022-02-28 MED ORDER — DULOXETINE HCL 60 MG PO CPEP: 60.0000 mg | ORAL_CAPSULE | Freq: Every day | ORAL | 3 refills | Status: DC

## 2022-02-28 MED ORDER — PREGABALIN 150 MG PO CAPS
150.0000 mg | ORAL_CAPSULE | Freq: Two times a day (BID) | ORAL | 3 refills | Status: DC
Start: 2022-02-28 — End: 2022-05-23

## 2022-02-28 NOTE — Progress Notes (Signed)
Subjective:    Patient ID: Robert Page, male    DOB: 07/02/1946, 76 y.o.   MRN: [id number]  HPI Male with pmh/psh of polyneuropathy, atrial tachycardia, back surgery (?laminectomy L4-5), left foot surgery (tumor removal x4), dupuytens (palms and soles), HTN, GERD presents for follow-up of bilateral neuropathy.  Initially stated: Started summer 2000.  He had foot surgery in left foot in ~2016. Progressing.  Radiating proximally.  No symptoms in hands.  Ambulating and activity improves the pain. Shoes and inactivity exacerbate the pain. Hot and tingling/burning.  He notes I am the 6th doctor he's been to. He saw PCP and Ortho, who did MRI and told non-surgical.  He was referred to Rheum, who did not believe it was RA and thought it was L4-5 radic.  He was referred to Neurology, who ordered NCV/EMG (showing polyneuropathy, left peroneal neuropathy, Left L5/S1 radic) and MRI.  MRI C-spine showing mild disc bulges C3-6. He had MRI L-spine.  He saw Neurosurg, who did Myelogram and was told symptoms not originating in back.  Constant.  Associated numbness.  Gabapentin, Lyrica, Cymbalta did not help. He was told he has tried "every single medication for neuropathy" without benefit. He notes improvement when he sleeps in a different bed.  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 Pain does not limit activities.   Since that time, patient states he has not tried heat. He states he had burning with Capsacin. He obtained Vitamin levels. Sleep has improved.  Patient states he was losing weight, but gained in on vacation. Patient states he often does not have time to take care of his body, take meds consistently, etc.   -He is taking Lyrica and Cymbalta. He has tightness and stiffness in both lower extremities. He had a dose of steroid dose pack that did provide some relief to his back pain. If he is barefoot in the shower  he feels off balance. He saw Dr. Doran Durand at [facility]. He has had prior foot surgery- was told he had arthritis. He saw rheumatology and was told he does not have rheumatoid arthritis. He had EMG/NCS (results above).   Both feet are really stiff He is ready to try Qutenza today, he received minimal benefit with first treatment Discussed trying longer duration to 35 minutes today since he did not have any burning with prior treatment and he is agreeable to this Did not require ice packs during treatment today, but toward end of treatment developed burning on dorsum of right foot Pain can be very severe at night, this is when pain continues to be worse Needs medication refills today It negatively affects his sleep He feels burning pain worst on tops of both feet but also present in soles He also has pain radiating from his back into his right shin He is ok on his medicines- no refills needed right now He is not sure if Lyrica or Cymbalta are helping. He does not want to take things that are not needed  Pain Inventory Average Pain 5 Pain Right Now 3 My pain is burning at times, stabbing, aching  In the last 24 hours, has pain interfered with the following? General activity 1 Relation with others 2 Enjoyment of life 4 What TIME of day is your pain at its worst? Daytime and evening Sleep (in general) Good  Pain is worse with: sitting, standing, some activities  Pain improves with: rest, possibly medication Relief from Meds: 1   Family History  Problem Relation Age of Onset   Hypertension Mother    Macular degeneration Mother    Kidney disease Mother    Dementia Mother    Hypertension Father    Melanoma Sister    Stroke Other        uncle   Prostate cancer Other        uncle   Colon cancer Other        grandmother/uncle   Kidney disease Other        grandmother   Liver cancer Other        uncle (mets)   Heart disease Other        aunt/uncle   Lung cancer Other         uncle   Stomach cancer Cousin    Heart disease Cousin    Colon cancer Paternal Grandmother    Colon cancer Paternal Uncle    Prostate cancer Paternal Uncle    Arthritis Brother    Hypertension Brother    Esophageal cancer Neg Hx    Rectal cancer Neg Hx    Social History   Socioeconomic History   Marital status: Married    Spouse name: Davaughn Hillyard   Number of children: 3   Years of education: Not on file   Highest education level: Not on file  Occupational History   Occupation: Retired  Tobacco Use   Smoking status: Former    Types: Cigars    Quit date: 2015    Years since quitting: 8.4   Smokeless tobacco: Never   Tobacco comments:    Pt states smokes occasional cigar.  This is typically < 1 time per month  Vaping Use   Vaping Use: Never used  Substance and Sexual Activity   Alcohol use: No    Alcohol/week: 0.0 standard drinks of alcohol   Drug use: No   Sexual activity: Not on file  Other Topics Concern   Not on file  Social History Narrative   Pt lives in Home.     Retired from [employer] and [employer].     Goes on mission trips to [place], gets regular exercise.   Caffeine use: very rare, Diet coke   Right handed    Social Determinants of Health   Financial Resource Strain: Low Risk  (09/01/2021)   Overall Financial Resource Strain (CARDIA)    Difficulty of Paying Living Expenses: Not hard at all  Food Insecurity: No Food Insecurity (09/01/2021)   Hunger Vital Sign    Worried About Running Out of Food in the Last Year: Never true    Ran Out of Food in the Last Year: Never true  Transportation Needs: No Transportation Needs (09/01/2021)   PRAPARE - Hydrologist (Medical): No    Lack of Transportation (Non-Medical): No  Physical Activity: Sufficiently Active (09/01/2021)   Exercise Vital Sign    Days of Exercise per Week: 5 days    Minutes of Exercise per Session: 30 min  Stress: No Stress Concern Present (09/01/2021)    [place]    Feeling of Stress : Not at all  Social Connections: [place] (09/01/2021)   Social Connection and Isolation Panel [NHANES]    Frequency of Communication with Friends and Family: More than three times a week    Frequency of Social Gatherings with Friends and Family:  Three times a week    Attends Religious Services: More than 4 times per year    Active Member of Clubs or Organizations: Yes    Attends Archivist Meetings: More than 4 times per year    Marital Status: Married   Past Surgical History:  Procedure Laterality Date   BACK SURGERY     12/2013   CARDIAC CATHETERIZATION  1990's   COLECTOMY  2010   for diverticulitis   COLON SURGERY N/A    Phreesia 08/27/2020   COLONOSCOPY  04/03/2013   LASIK  2002   Bil   POLYPECTOMY     Past Medical History:  Diagnosis Date   Adenomatous polyp    Allergy    Atrial tachycardia ([place])    Diverticulosis    Dupuytren's disease    palms and soles   GERD (gastroesophageal reflux disease)    Hypertension    BP 130/74   Pulse 64   Ht 5' 9.5" (1.765 m)   Wt 234 lb 12.8 oz (106.5 kg)   SpO2 93%   BMI 34.18 kg/m   Opioid Risk Score:   Fall Risk Score:  `1  Depression screen [facility] 2/9     11/22/2021    1:54 PM 09/01/2021   11:58 AM 08/09/2021   11:29 AM 03/11/2021    9:47 AM 12/06/2020    9:06 AM 11/01/2020    9:16 AM 08/30/2020    8:19 AM  Depression screen PHQ 2/9  Decreased Interest 0 0 0 0 0 0 0  Down, Depressed, Hopeless 0 0 0 0 0 0 0  PHQ - 2 Score 0 0 0 0 0 0 0  Altered sleeping      0   Tired, decreased energy      1   Change in appetite      0   Feeling bad or failure about yourself       0   Trouble concentrating      0   Moving slowly or fidgety/restless      0   Suicidal thoughts      0   PHQ-9 Score      1    Review of Systems  Constitutional: Negative.   HENT: Negative.    Eyes: Negative.   Respiratory:  Negative.    Cardiovascular: Negative.   Gastrointestinal: Negative.   Endocrine: Negative.   Genitourinary: Negative.   Musculoskeletal:  Positive for gait problem.  Skin: Negative.   Allergic/Immunologic: Negative.   Hematological: Negative.   Psychiatric/Behavioral: Negative.        Objective:   Physical Exam  Gen: no distress, normal appearing, BMI 34.18, weight 234 lbs, BP 130/74 HEENT: oral mucosa pink and moist, NCAT Cardio: Reg rate Chest: normal effort, normal rate of breathing Abd: soft, non-distended Ext: no edema Psych: pleasant, normal affect Skin: intact, no open lesions on both feet Neuro: HOH Motor: B/l LE: 5/5 throughout Sensation diminished to light touch b/l LE on soles of both feet Vibration diminished in b/l LE knees and distally, intact UE (previously)    Assessment & Plan:  Male with pmh/psh ofpolyneuropathy, atrial tachycardia, back surgery (?laminectomy L4-5), left foot surgery (tumor removal x4), dupuytens (palms and soles), HTN, GERD presents for f/u b/l feet pain.  1. Chronic bilateral feet pain -   MRI foot report unremarkable.  MRI back report showing ? L5-S1 radic  NCS/EMG - showing polyneuropathy and L5-SI radic on left, results reviewed.   No benefit  with Gabapentin  Trial Heat, reminded again  Will consider PT  Discussing purchasing TENS unit from [facility]  Trial OTC Lidocaine patch  Continue Lyrica 150 TID  Will consider referral to Neurosurg, pt would like to hold off at present  Will consider ESI, pt would like to hold off at present  Will consider PNS  Retry Capsacin, educated again  Refilled Cymbalta  -Discussed Qutenza as an option for neuropathic pain control. Discussed that this is a capsaicin patch, stronger than capsaicin cream. Discussed that it is currently approved for diabetic peripheral neuropathy and post-herpetic neuralgia, but that it has also shown benefit in treating other forms of neuropathy. Provided patient with link  to site to learn more about the patch: CinemaBonus.fr. Discussed that the patch would be placed in office and benefits usually last 3 months. Discussed that unintended exposure to capsaicin can cause severe irritation of eyes, mucous membranes, respiratory tract, and skin, but that Qutenza is a local treatment and does not have the systemic side effects of other nerve medications. Discussed that there may be pain, itching, erythema, and decreased sensory function associated with the application of Qutenza. Side effects usually subside within 1 week. A cold pack of analgesic medications can help with these side effects. Blood pressure can also be increased due to pain associated with administration of the patch.   4 patches of Qutenza was applied to the area of pain. Ice packs were applied during the procedure to ensure patient comfort. Blood pressure was monitored every 15 minutes. The patient tolerated the procedure well. Post-procedure instructions were given and follow-up has been scheduled.    Patient states main goal is to be pain free - improving  Educated on importance of caring for self. -Discussed current symptoms of pain and history of pain.  -Discussed benefits of exercise in reducing pain. -Discussed following foods that may reduce pain: 1) Ginger (especially studied for arthritis)- reduce leukotriene production to decrease inflammation 2) Blueberries- high in phytonutrients that decrease inflammation 3) Salmon- marine omega-3s reduce joint swelling and pain 4) Pumpkin seeds- reduce inflammation 5) dark chocolate- reduces inflammation 6) turmeric- reduces inflammation 7) tart cherries - reduce pain and stiffness 8) extra virgin olive oil - its compound olecanthal helps to block prostaglandins  9) chili peppers- can be eaten or applied topically via capsaicin 10) mint- helpful for headache, muscle aches, joint pain, and itching 11) garlic- reduces inflammation  Link to further  information on diet for chronic pain: http://www.randall.com/    2. Sleep disturbance  Discontinue amitriptyline  Discussed that Qutenza has been particularly shown to help with painful neuropathy at night -Try to go outside near sunrise -Get exercise during the day.  -Turn off all devices an hour before bedtime.  -Teas that can benefit: chamomile, valerian root, Brahmi (Bacopa) -Can consider over the counter melatonin, magnesium, and/or L-theanine. Melatonin is an anti-oxidant with multiple health benefits. Magnesium is involved in greater than 300 enzymatic reactions in the body and most of [place] are deficient as our soil is often depleted. There are 7 different types of magnesium- Bioptemizer's is a supplement with all 7 types, and each has unique benefits. Magnesium can also help with constipation and anxiety.  -Pistachios naturally increase the production of melatonin -Cozy Earth bamboo bed sheets are free from toxic chemicals.  -Tart cherry juice or a tart cherry supplement can improve sleep and soreness post-workout    Continue tylenol PM  3. Obesity, BMI 34.18, weight 234 kbs  Gaining weight  Not interested in seeing dietitian  Educated again  4. Myalgia  Robaxin 500 TID PRN

## 2022-03-01 ENCOUNTER — Other Ambulatory Visit: Payer: Self-pay | Admitting: Family Medicine

## 2022-03-01 ENCOUNTER — Emergency Department (HOSPITAL_BASED_OUTPATIENT_CLINIC_OR_DEPARTMENT_OTHER)
Admission: EM | Admit: 2022-03-01 | Discharge: 2022-03-02 | Disposition: A | Discharge status: Home / Self Care | Payer: Medicare Other | Attending: Emergency Medicine | Admitting: Emergency Medicine

## 2022-03-01 ENCOUNTER — Encounter (HOSPITAL_BASED_OUTPATIENT_CLINIC_OR_DEPARTMENT_OTHER): Payer: Self-pay

## 2022-03-01 ENCOUNTER — Other Ambulatory Visit: Payer: Self-pay

## 2022-03-01 DIAGNOSIS — S0501XA Injury of conjunctiva and corneal abrasion without foreign body, right eye, initial encounter: Secondary | ICD-10-CM | POA: Diagnosis present

## 2022-03-01 DIAGNOSIS — Z87891 Personal history of nicotine dependence: Secondary | ICD-10-CM | POA: Insufficient documentation

## 2022-03-01 DIAGNOSIS — X58XXXA Exposure to other specified factors, initial encounter: Secondary | ICD-10-CM | POA: Insufficient documentation

## 2022-03-01 DIAGNOSIS — I10 Essential (primary) hypertension: Secondary | ICD-10-CM | POA: Diagnosis not present

## 2022-03-01 NOTE — Telephone Encounter (Signed)
Called, left VM to call back to schedule OV to follow up with PCP. Will refill medication for 30 days.  Requested Prescriptions  Pending Prescriptions Disp Refills  . valsartan (DIOVAN) 160 MG tablet [Pharmacy Med Name: VALSARTAN 160 MG TABLET] 30 tablet 0    Sig: TAKE 1 TABLET BY MOUTH EVERY DAY     Cardiovascular:  Angiotensin Receptor Blockers Failed - 03/01/2022  1:58 AM      Failed - Cr in normal range and within 180 days    Creat  Date Value Ref Range Status  02/24/2021 1.07 0.70 - 1.18 mg/dL Final    Comment:    For patients >96 years of age, the reference limit for Creatinine is approximately 13% higher for people identified as African-American. .          Failed - K in normal range and within 180 days    Potassium  Date Value Ref Range Status  02/24/2021 4.4 3.5 - 5.3 mmol/L Final         Failed - Valid encounter within last 6 months    Recent Outpatient Visits          10 months ago Fever, unspecified fever cause   [place] [name], [name], MD   1 year ago Right sided sciatica   [place] Susy Frizzle, MD   1 year ago Skin tear of left hand without complication, initial encounter   [place] Eulogio Bear, NP   1 year ago Benign essential HTN   [place] [name] [name], MD   1 year ago Acute swimmer's ear of left side   [place] [name], [name], MD      Future Appointments            In 1 month Irish Lack, Charlann Lange, MD [employer], [facility]           Passed - Patient is not pregnant      Passed - Last BP in normal range    BP Readings from Last 1 Encounters:  02/28/22 130/74

## 2022-03-01 NOTE — ED Triage Notes (Signed)
Patient here POV from Home.  Endorses at approximately 1600 he began to have Irritation and Discomfort to Right Eye. Prior to, Patient was working out in [employer] ([place] if there is a FB in [place]).  No Visual Aids. No Changes in Vision.   NAD Noted during Triage. A&Ox4. GCS 15. Ambulatory.

## 2022-03-02 MED ORDER — ERYTHROMYCIN 5 MG/GM OP OINT: 1.0000 | TOPICAL_OINTMENT | Freq: Four times a day (QID) | OPHTHALMIC | 0 refills | Status: AC

## 2022-03-02 MED ORDER — FLUORESCEIN SODIUM 1 MG OP STRP
1.0000 | ORAL_STRIP | Freq: Once | OPHTHALMIC | Status: AC
  Administered 2022-03-02: 1 via OPHTHALMIC
  Filled 2022-03-02: qty 1

## 2022-03-02 MED ORDER — TETRACAINE HCL 0.5 % OP SOLN
2.0000 [drp] | Freq: Once | OPHTHALMIC | Status: AC
  Administered 2022-03-02: 2 [drp] via OPHTHALMIC
  Filled 2022-03-02: qty 4

## 2022-03-02 NOTE — Discharge Instructions (Signed)
You were evaluated in the Emergency Department and after careful evaluation, we did not find any emergent condition requiring admission or further testing in the hospital.  Your exam/testing today was overall reassuring.  Symptoms seem to be due to a scratch on the surface of your eye.  Use the erythromycin ointment as directed, follow-up with your eye doctor if not improving over the next 3 days.  Please return to the Emergency Department if you experience any worsening of your condition.  Thank you for allowing [place] to be a part of your care.

## 2022-03-02 NOTE — ED Provider Notes (Signed)
[facility] Emergency Department Provider Note MRN:  [id number]  Arrival date & time: 03/02/22     Chief Complaint   Eye Problem   History of Present Illness   Robert Page is a 76 y.o. year-old male with a history of hypertension presenting to the ED with chief complaint of eye problem.  Irritation and foreign body sensation to the right eye for the past few hours.  Started when he was out in the field tending to the wheat on a tractor.  Thinks maybe he has something in the eye or he has a "turned in eyelash".  No vision loss, no other complaints.  Does not wear contacts.  History of LASEK surgery.  Review of Systems  A thorough review of systems was obtained and all systems are negative except as noted in the HPI and PMH.   Patient's Health History    Past Medical History:  Diagnosis Date   Adenomatous polyp    Allergy    Atrial tachycardia ([place])    Diverticulosis    Dupuytren's disease    palms and soles   GERD (gastroesophageal reflux disease)    Hypertension     Past Surgical History:  Procedure Laterality Date   BACK SURGERY     12/2013   CARDIAC CATHETERIZATION  1990's   COLECTOMY  2010   for diverticulitis   COLON SURGERY N/A    Phreesia 08/27/2020   COLONOSCOPY  04/03/2013   LASIK  2002   Bil   POLYPECTOMY      Family History  Problem Relation Age of Onset   Hypertension Mother    Macular degeneration Mother    Kidney disease Mother    Dementia Mother    Hypertension Father    Melanoma Sister    Stroke Other        uncle   Prostate cancer Other        uncle   Colon cancer Other        grandmother/uncle   Kidney disease Other        grandmother   Liver cancer Other        uncle (mets)   Heart disease Other        aunt/uncle   Lung cancer Other        uncle   Stomach cancer Cousin    Heart disease Cousin    Colon cancer Paternal Grandmother    Colon cancer Paternal Uncle    Prostate cancer Paternal Uncle     Arthritis Brother    Hypertension Brother    Esophageal cancer Neg Hx    Rectal cancer Neg Hx     Social History   Socioeconomic History   Marital status: Married    Spouse name: Jacorian Golaszewski   Number of children: 3   Years of education: Not on file   Highest education level: Not on file  Occupational History   Occupation: Retired  Tobacco Use   Smoking status: Former    Types: Cigars    Quit date: 2015    Years since quitting: 8.4   Smokeless tobacco: Never   Tobacco comments:    Pt states smokes occasional cigar.  This is typically < 1 time per month  Vaping Use   Vaping Use: Never used  Substance and Sexual Activity   Alcohol use: No    Alcohol/week: 0.0 standard drinks of alcohol   Drug use: No   Sexual activity: Not on file  Other  Topics Concern   Not on file  Social History Narrative   Pt lives in [place].     Retired from [employer] and [employer].     Goes on mission trips to [place], gets regular exercise.   Caffeine use: very rare, Diet coke   Right handed    Social Determinants of Health   Financial Resource Strain: Low Risk  (09/01/2021)   Overall Financial Resource Strain (CARDIA)    Difficulty of Paying Living Expenses: Not hard at all  Food Insecurity: No Food Insecurity (09/01/2021)   Hunger Vital Sign    Worried About Running Out of Food in the Last Year: Never true    Ran Out of Food in the Last Year: Never true  Transportation Needs: No Transportation Needs (09/01/2021)   PRAPARE - Hydrologist (Medical): No    Lack of Transportation (Non-Medical): No  Physical Activity: Sufficiently Active (09/01/2021)   Exercise Vital Sign    Days of Exercise per Week: 5 days    Minutes of Exercise per Session: 30 min  Stress: No Stress Concern Present (09/01/2021)   [place]    Feeling of Stress : Not at all  Social Connections: [place]  (09/01/2021)   Social Connection and Isolation Panel [NHANES]    Frequency of Communication with Friends and Family: More than three times a week    Frequency of Social Gatherings with Friends and Family: Three times a week    Attends Religious Services: More than 4 times per year    Active Member of Clubs or Organizations: Yes    Attends Archivist Meetings: More than 4 times per year    Marital Status: Married  Human resources officer Violence: Not At Risk (09/01/2021)   Humiliation, Afraid, Rape, and Kick questionnaire    Fear of Current or Ex-Partner: No    Emotionally Abused: No    Physically Abused: No    Sexually Abused: No     Physical Exam   Vitals:   03/01/22 2125  BP: (!) 151/73  Pulse: 72  Resp: 16  Temp: 98 F (36.7 C)  SpO2: 95%    CONSTITUTIONAL: Well-appearing, NAD NEURO/PSYCH:  Alert and oriented x 3, no focal deficits EYES:  eyes equal and reactive, normal extraocular movements, mild conjunctival erythema on the right ENT/NECK:  no LAD, no JVD CARDIO: Regular rate, well-perfused, normal S1 and S2 PULM:  CTAB no wheezing or rhonchi GI/GU:  non-distended, non-tender MSK/SPINE:  No gross deformities, no edema SKIN:  no rash, atraumatic   *Additional and/or pertinent findings included in MDM below  Diagnostic and Interventional Summary    EKG Interpretation  Date/Time:    Ventricular Rate:    PR Interval:    QRS Duration:   QT Interval:    QTC Calculation:   R Axis:     Text Interpretation:         Labs Reviewed - No data to display  No orders to display    Medications  fluorescein ophthalmic strip 1 strip (1 strip Both Eyes Given 03/02/22 0027)  tetracaine (PONTOCAINE) 0.5 % ophthalmic solution 2 drop (2 drops Both Eyes Given 03/02/22 0027)     Procedures  /  Critical Care Procedures  ED Course and Medical Decision Making  Initial Impression and Ddx Suspect foreign body versus corneal abrasion, will provide tetracaine and undergo  fluorescein staining and Woods lamp evaluation for further evaluation.  Past  medical/surgical history that increases complexity of ED encounter: History of Lasix surgery  Interpretation of Diagnostics Laboratory and/or imaging options to aid in the diagnosis/care of the patient were considered.  After careful history and physical examination, it was determined that there was no indication for diagnostics at this time.  Patient Reassessment and Ultimate Disposition/Management     Fluorescein staining revealing a conjunctival abrasion, careful examination reveals no retained foreign body, appropriate for discharge.  Patient management required discussion with the following services or consulting groups:  None  Complexity of Problems Addressed Acute complicated illness or Injury  Additional Data Reviewed and Analyzed Further history obtained from: None  Additional Factors Impacting ED Encounter Risk Prescriptions  Barth Kirks. Sedonia Small, MD [place] [email or web address]  Final Clinical Impressions(s) / ED Diagnoses     ICD-10-CM   1. Abrasion of right conjunctiva, initial encounter  [email or web address]       ED Discharge Orders          Ordered    erythromycin ophthalmic ointment  4 times daily        03/02/22 0058             Discharge Instructions Discussed with and Provided to Patient:     Discharge Instructions      You were evaluated in the Emergency Department and after careful evaluation, we did not find any emergent condition requiring admission or further testing in the hospital.  Your exam/testing today was overall reassuring.  Symptoms seem to be due to a scratch on the surface of your eye.  Use the erythromycin ointment as directed, follow-up with your eye doctor if not improving over the next 3 days.  Please return to the Emergency Department if you experience any worsening of your condition.  Thank you for allowing [place]  to be a part of your care.        Maudie Flakes, MD 03/02/22 0100

## 2022-03-06 NOTE — Progress Notes (Signed)
Chronic Care Management Pharmacy Note  03/10/2022 Name:  Robert Page MRN:  [id number] DOB:  September 17, 1945  Summary: Follow up visit - capsaicin patches are helping somewhat.  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  NO changes at this time  FU 1 year HC to FU in about 6 months   Subjective: Robert Page is an 76 y.o. year old male who is a primary patient of [name], [name], MD.  The CCM team was consulted for assistance with disease management and care coordination needs.    Engaged with patient by telephone for follow up visit in response to provider referral for pharmacy case management and/or care coordination services.   Consent to Services:  The patient was given the following information about Chronic Care Management services today, agreed to services, and gave verbal consent: 1. CCM service includes personalized support from designated clinical staff supervised by the primary care provider, including individualized plan of care and coordination with other care providers 2. 24/7 contact phone numbers for assistance for urgent and routine care needs. 3. Service will only be billed when office clinical staff spend 20 minutes or more in a month to coordinate care. 4. Only one practitioner may furnish and bill the service in a calendar month. 5.The patient may stop CCM services at any time (effective at the end of the month) by phone call to the office staff. 6. The patient will be responsible for cost sharing (co-pay) of up to 20% of the service fee (after annual deductible is met). Patient agreed to services and consent obtained.  Patient Care Team: Susy Frizzle, MD as PCP - General (Family Medicine) Jettie Booze, MD as PCP - Cardiology (Cardiology) Thompson Grayer, MD as Attending Physician (Cardiology) Edythe Clarity, [facility] (Pharmacist)  Recent office visits:  04/22/21 Dr. [name] For illness. STARTED Amoxicillin-Pot Clavulanate 875-125 mg 1  tablet 2 times daily. STOPPED Methocarbamol and Prednisone.    Recent consult visits:  03/11/21 Physical Medicine and Rehabilitation. For follow-up. No medication changes.  Objective:  Lab Results  Component Value Date   CREATININE 1.07 02/24/2021   BUN 23 02/24/2021   GFR 74.13 12/19/2010   GFRNONAA 68 02/24/2021   GFRAA 79 02/24/2021   NA 142 02/24/2021   K 4.4 02/24/2021   CALCIUM 9.4 02/24/2021   CO2 30 02/24/2021   GLUCOSE 122 (H) 02/24/2021    Lab Results  Component Value Date/Time   HGBA1C 5.9 (H) 02/24/2021 11:28 AM   HGBA1C CANCELED 03/17/2019 02:40 PM   GFR 74.13 12/19/2010 08:44 AM    Last diabetic Eye exam: No results found for: "HMDIABEYEEXA"  Last diabetic Foot exam: No results found for: "HMDIABFOOTEX"   Lab Results  Component Value Date   CHOL 134 03/17/2021   HDL 51 03/17/2021   LDLCALC 64 03/17/2021   TRIG 105 03/17/2021   CHOLHDL 2.6 03/17/2021       Latest Ref Rng & Units 03/17/2021    7:33 AM 02/24/2021   11:28 AM 08/30/2020    9:02 AM  Hepatic Function  Total Protein 6.0 - 8.5 g/dL 6.6  6.5  7.0   Albumin 3.7 - 4.7 g/dL 4.4     AST 0 - 40 IU/L _0 ALT 0 - 44 IU/L _1 Alk Phosphatase 44 - 121 IU/L 65     Total Bilirubin 0.0 - 1.2 mg/dL 0.3  0.4  0.4  Bilirubin, Direct 0.00 - 0.40 mg/dL 0.11       Lab Results  Component Value Date/Time   TSH 0.650 12/06/2020 10:00 AM   TSH 0.68 03/17/2019 02:40 PM       Latest Ref Rng & Units 02/24/2021   11:28 AM 08/30/2020    9:02 AM 10/29/2017    3:09 PM  CBC  WBC 3.8 - 10.8 Thousand/uL 6.3  7.8  8.0   Hemoglobin 13.2 - 17.1 g/dL 15.1  16.3  16.0   Hematocrit 38.5 - 50.0 % 44.7  47.2  47.3   Platelets 140 - 400 Thousand/uL 256  275  269     Lab Results  Component Value Date/Time   VD25OH 29.8 (L) 12/06/2020 10:00 AM    Clinical ASCVD: No  The 10-year ASCVD risk score (Arnett DK, et al., 2019) is: 24.4%   Values used to calculate the score:     Age: 76 years     Sex:  Male     Is Non-Hispanic African American: No     Diabetic: No     Tobacco smoker: No     Systolic Blood Pressure: 132 mmHg     Is BP treated: Yes     HDL Cholesterol: 51 mg/dL     Total Cholesterol: 134 mg/dL       11/22/2021    1:54 PM 09/01/2021   11:58 AM 08/09/2021   11:29 AM  Depression screen PHQ 2/9  Decreased Interest 0 0 0  Down, Depressed, Hopeless 0 0 0  PHQ - 2 Score 0 0 0     Social History   Tobacco Use  Smoking Status Former   Types: Cigars   Quit date: 2015   Years since quitting: 8.5  Smokeless Tobacco Never  Tobacco Comments   Pt states smokes occasional cigar.  This is typically < 1 time per month   BP Readings from Last 3 Encounters:  03/02/22 132/78  02/28/22 130/74  11/22/21 132/69   Pulse Readings from Last 3 Encounters:  03/02/22 (!) 55  02/28/22 64  11/22/21 64   Wt Readings from Last 3 Encounters:  03/01/22 234 lb 12.6 oz (106.5 kg)  02/28/22 234 lb 12.8 oz (106.5 kg)  11/22/21 237 lb 12.8 oz (107.9 kg)   BMI Readings from Last 3 Encounters:  03/01/22 34.18 kg/m  02/28/22 34.18 kg/m  11/22/21 34.61 kg/m    Assessment/Interventions: Review of patient past medical history, allergies, medications, health status, including review of consultants reports, laboratory and other test data, was performed as part of comprehensive evaluation and provision of chronic care management services.   SDOH:  (Social Determinants of Health) assessments and interventions performed: No, assessed within the last year:  Financial Resource Strain: Low Risk  (09/01/2021)   Overall Financial Resource Strain (CARDIA)    Difficulty of Paying Living Expenses: Not hard at all    SDOH Screenings   Alcohol Screen: Low Risk  (09/01/2021)   Alcohol Screen    Last Alcohol Screening Score (AUDIT): 0  Depression (PHQ2-9): Low Risk  (11/22/2021)   Depression (PHQ2-9)    PHQ-2 Score: 0  Financial Resource Strain: Low Risk  (09/01/2021)   Overall Financial  Resource Strain (CARDIA)    Difficulty of Paying Living Expenses: Not hard at all  Food Insecurity: No Food Insecurity (09/01/2021)   Hunger Vital Sign    Worried About Running Out of Food in the Last Year: Never true    Ran Out of Food in   the Last Year: Never true  Housing: Low Risk  (09/01/2021)   Housing    Last Housing Risk Score: 0  Physical Activity: Sufficiently Active (09/01/2021)   Exercise Vital Sign    Days of Exercise per Week: 5 days    Minutes of Exercise per Session: 30 min  Social Connections: Socially Integrated (09/01/2021)   Social Connection and Isolation Panel [NHANES]    Frequency of Communication with Friends and Family: More than three times a week    Frequency of Social Gatherings with Friends and Family: Three times a week    Attends Religious Services: More than 4 times per year    Active Member of Clubs or Organizations: Yes    Attends Archivist Meetings: More than 4 times per year    Marital Status: Married  Stress: No Stress Concern Present (09/01/2021)   [place]    Feeling of Stress : Not at all  Tobacco Use: Medium Risk (03/01/2022)   Patient History    Smoking Tobacco Use: Former    Smokeless Tobacco Use: Never    Passive Exposure: Not on file  Transportation Needs: No Transportation Needs (09/01/2021)   PRAPARE - Hydrologist (Medical): No    Lack of Transportation (Non-Medical): No    CCM Care Plan  Allergies  Allergen Reactions   Ibuprofen Other (See Comments)    stomach cramps  Other reaction(s): Unknown    Medications Reviewed Today     Reviewed by Edythe Clarity, [facility] (Pharmacist) on 03/10/22 at 1339  Med List Status: <None>   Medication Order Taking? Sig Documenting Provider Last Dose Status Informant  amitriptyline (ELAVIL) 10 MG tablet [id number] Yes Take 0.5 tablets (5 mg total) by mouth at bedtime. Jamse Arn,  MD Taking Active   amLODipine (NORVASC) 5 MG tablet [id number] Yes TAKE 1 TABLET BY MOUTH EVERY DAY Susy Frizzle, MD Taking Active   aspirin EC 81 MG tablet [id number] Yes Take 81 mg by mouth daily. [provider] Taking Active Self  Capsaicin 0.1 % CREA [id number] Yes Apply twice a day to feet Posey Pronto, Domenick Bookbinder, MD Taking Active   diphenhydramine-acetaminophen (TYLENOL PM) 25-500 MG TABS tablet [id number] Yes Take 1 tablet by mouth at bedtime as needed. [provider] Taking Active   DULoxetine (CYMBALTA) 60 MG capsule [id number] Yes Take 1 capsule (60 mg total) by mouth daily. Izora Ribas, MD Taking Active   fexofenadine (ALLEGRA) 180 MG tablet [id number] Yes Take 180 mg by mouth daily. [provider] Taking Active Self  fluticasone (FLONASE) 50 MCG/ACT nasal spray [id number] Yes SPRAY 2 SPRAYS INTO EACH NOSTRIL EVERY DAY Susy Frizzle, MD Taking Active   hydrochlorothiazide (HYDRODIURIL) 25 MG tablet [id number] Yes TAKE 1 TABLET (25 MG TOTAL) BY MOUTH DAILY. Susy Frizzle, MD Taking Active   meloxicam [facility]) 7.5 MG tablet [id number] Yes meloxicam 7.5 mg tablet  Take 1 tablet PO twice a day for 2 weeks and then PRN [provider] Taking Active   methocarbamol (ROBAXIN) 500 MG tablet [id number] Yes methocarbamol 500 mg tablet  TAKE 1 TABLET BY MOUTH EVERY 8 HOURS AS NEEDED FOR MUSCLE SPASMS. [provider] Taking Active   omeprazole (PRILOSEC) 20 MG capsule [id number] Yes Take 20 mg by mouth daily. [provider] Taking Active Self  pregabalin (LYRICA) 150 MG capsule [id number] Yes Take 1 capsule (150 mg total) by mouth 2 (two) times  daily. Raulkar, Krutika P, MD Taking Active   rosuvastatin (CRESTOR) 10 MG tablet [id number] Yes TAKE 1 TABLET (10 MG TOTAL) BY MOUTH DAILY. APPOINTMENT NEEDED FOR FURTHER REFILLS Varanasi, Jayadeep S, MD Taking Active   valsartan (DIOVAN) 160 MG tablet [id number] Yes TAKE 1 TABLET BY MOUTH EVERY DAY  [name], Warren T, MD Taking Active             Patient Active Problem List   Diagnosis Date Noted   Morbid obesity (HCC) 12/06/2020   Lumbosacral radiculopathy 11/01/2020   Sleep disturbance 11/01/2020   Polyneuropathy 09/24/2019   Dysesthesia 09/24/2019   Bilateral leg pain 09/24/2019   Chest pain, atypical 11/27/2017   Thyroid nodule 10/25/2015   Essential hypertension 03/14/2015   Cough 03/14/2015   Multiple pulmonary nodules 03/14/2015   Lower respiratory tract infection 03/14/2015   SVT (supraventricular tachycardia) (HCC) 11/03/2012   Fatigue 11/03/2012   GERD (gastroesophageal reflux disease) 10/24/2012   Plantar fasciitis 12/21/2010   Routine general medical examination at a health care facility 12/16/2010   Prostate cancer screening 12/16/2010   HAND PAIN, BILATERAL 11/26/2009   PERSONAL HX COLONIC POLYPS 10/26/2008   EXTERNAL HEMORRHOIDS 09/23/2008   HYPERTENSION 01/01/2007   Allergic rhinitis 01/01/2007   DIVERTICULOSIS, COLON 01/01/2007   DIVERTICULITIS, HX OF 01/01/2007    Immunization History  Administered Date(s) Administered   Fluad Quad(high Dose 65+) 08/10/2020   H1N1 09/23/2008   Influenza Split 06/04/2014   Influenza,inj,Quad PF,6+ Mos 08/08/2013, 05/17/2015, 10/17/2016   PFIZER(Purple Top)SARS-COV-2 Vaccination 09/25/2019, 10/16/2019, 08/30/2020   Pneumococcal Conjugate-13 10/17/2016   Pneumococcal Polysaccharide-23 03/11/2013   Td 03/04/2004   Tdap 10/17/2016    Conditions to be addressed/monitored:  HLD, Neuoropathy, HTN  Care Plan : General Pharmacy (Adult)  Updates made by Davis, Christian L, RPH since 03/10/2022 12:00 AM     Problem: HLD, Neuoropathy, HTN   Priority: High  Onset Date: 03/03/2021     Long-Range Goal: Patient-Specific Goal   Start Date: 03/03/2021  Expected End Date: 09/02/2021  Recent Progress: On track  Priority: High  Note:   Current Barriers:  Peripheral neuropathy   Pharmacist Clinical Goal(s):  Patient  will achieve adherence to monitoring guidelines and medication adherence to achieve therapeutic efficacy achieve control of cholesterol as evidenced by lab work adhere to prescribed medication regimen as evidenced by fill dates contact provider office for questions/concerns as evidenced notation of same in electronic health record through collaboration with PharmD and provider.   Interventions: 1:1 collaboration with [name], Warren T, MD regarding development and update of comprehensive plan of care as evidenced by provider attestation and co-signature Inter-disciplinary care team collaboration (see longitudinal plan of care) Comprehensive medication review performed; medication list updated in electronic medical record  Hypertension (BP goal <140/90) -Controlled, not assessed at this visit -Current treatment: Amlodipine 5mg daily HCTZ 25mg daily Valsartan 160mg daily -Medications previously tried: Olmesartan  -Current home readings: not checking at home -Denies hypotensive/hypertensive symptoms -Educated on BP goals and benefits of medications for prevention of heart attack, stroke and kidney damage; Importance of home blood pressure monitoring; Symptoms of hypotension and importance of maintaining adequate hydration; -Counseled to monitor BP at home if symptomatic, document, and provide log at future appointments -Recommended to continue current medication  Hyperlipidemia: (LDL goal < 100) 03/08/22 Controlled -Current treatment: Rosuvastatin 10mg daily Appropriate, Effective, Safe, Accessible -Medications previously tried: none noted  -Educated on Cholesterol goals;  Benefits of statin for ASCVD risk reduction; Importance of limiting foods high in cholesterol; -LDL   well controlled based on most recent labs. Tolerating statin well, no changes needed at this time. Almost 1 year from last labs, due for recheck.  Patient to call and make FU visit with Dr. [name] for fasting labs to  recheck lipids.  Update 09/02/21 LDL improved substantially since starting Crestor. Most recent LDL was 64 in July.  He continues to take Crestor daily with no concerns. Encouraged continued adherence. Recheck lipids at next CPE. No changes to meds at this time.  Neuropathy (Goal: Minimize symptoms) 03/08/22 -Not ideally controlled -Current treatment  Lyrica 150mg Bid Appropriate, Effective, Safe, Accessible Cymbalta 60mg daily Appropriate, Effective, Safe, Accessible -Medications previously tried: gabapentin -Pain still comes and goes, it is tolerable at the moment. -He did report that he started the capsaicin patches and has had the procedure done twice now.  They have worked fairly well, however, he has not decided if he will continue them yet.  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 No changes at this time, continue to use capsaicin if they are working. Does still need to taper off Cymbalta if he is ever considering stopping.  Update 09/02/21 Patient is tapering down on his Cymbalta currently at 40mg daily with plans to decrease to 20mg next week.  Hopes to taper off completely. He is going to try Qutenza patches in April with hopes of coming off all other medications completely.These are capsaicin patches that will last up to three months! No changes to meds at this time, continue tapering of Cymbalta.  Patient Goals/Self-Care Activities Patient will:  - take medications as prescribed focus on medication adherence by pill count check blood pressure when symptomatic, document, and provide at future appointments   Follow Up Plan: The care management team will reach out to the patient again over the next 1 year.              Medication Assistance: None required.  Patient affirms current coverage meets needs.  Compliance/Adherence/Medication fill history: Care Gaps: Zoster vaccines  Star medications: Rosuvastatin 10mg 02/13/22  90ds Valsartan 160mg 11/25/21 90ds  Patient's preferred pharmacy is:  [id number] - [place], [place] - [address] [place] [address] Phone: [phone] Fax: [phone]  [id number] - [place], [place] - [address] [place] [address] Phone: [phone] Fax: [phone]   Uses pill box? Yes Pt endorses 100% compliance  We discussed: Benefits of medication synchronization, packaging and delivery as well as enhanced pharmacist oversight with [employer]. Patient decided to: Continue current medication management strategy  Care Plan and Follow Up Patient Decision:  Patient agrees to Care Plan and Follow-up.  Plan: The care management team will reach out to the patient again over the next 180 days.  Christian Davis, PharmD Clinical Pharmacist [facility] [phone]          

## 2022-03-09 ENCOUNTER — Ambulatory Visit (INDEPENDENT_AMBULATORY_CARE_PROVIDER_SITE_OTHER): Payer: Medicare Other | Admitting: Pharmacist

## 2022-03-09 DIAGNOSIS — I10 Essential (primary) hypertension: Secondary | ICD-10-CM

## 2022-03-09 DIAGNOSIS — G629 Polyneuropathy, unspecified: Secondary | ICD-10-CM

## 2022-03-10 NOTE — Patient Instructions (Addendum)
Visit Information   Goals Addressed             This Visit's Progress    Track and Manage My Blood Pressure-Hypertension   On track    Timeframe:  Long-Range Goal Priority:  High Start Date:  03/03/21                           Expected End Date: 09/02/21                      Follow Up Date 06/03/21    - check blood pressure 3 times per week - choose a place to take my blood pressure (home, clinic or office, retail store) - write blood pressure results in a log or diary    Why is this important?   You won't feel high blood pressure, but it can still hurt your blood vessels.  High blood pressure can cause heart or kidney problems. It can also cause a stroke.  Making lifestyle changes like losing a little weight or eating less salt will help.  Checking your blood pressure at home and at different times of the day can help to control blood pressure.  If the doctor prescribes medicine remember to take it the way the doctor ordered.  Call the office if you cannot afford the medicine or if there are questions about it.     Notes:        Patient Care Plan: General Pharmacy (Adult)     Problem Identified: HLD, Neuoropathy, HTN   Priority: High  Onset Date: 03/03/2021     Long-Range Goal: Patient-Specific Goal   Start Date: 03/03/2021  Expected End Date: 09/02/2021  Recent Progress: On track  Priority: High  Note:   Current Barriers:  Peripheral neuropathy   Pharmacist Clinical Goal(s):  Patient will achieve adherence to monitoring guidelines and medication adherence to achieve therapeutic efficacy achieve control of cholesterol as evidenced by lab work adhere to prescribed medication regimen as evidenced by fill dates contact provider office for questions/concerns as evidenced notation of same in electronic health record through collaboration with PharmD and provider.   Interventions: 1:1 collaboration with Susy Frizzle, MD regarding development and update of  comprehensive plan of care as evidenced by provider attestation and co-signature Inter-disciplinary care team collaboration (see longitudinal plan of care) Comprehensive medication review performed; medication list updated in electronic medical record  Hypertension (BP goal <140/90) -Controlled, not assessed at this visit -Current treatment: Amlodipine '5mg'$  daily HCTZ '25mg'$  daily Valsartan '160mg'$  daily -Medications previously tried: Olmesartan  -Current home readings: not checking at home -Denies hypotensive/hypertensive symptoms -Educated on BP goals and benefits of medications for prevention of heart attack, stroke and kidney damage; Importance of home blood pressure monitoring; Symptoms of hypotension and importance of maintaining adequate hydration; -Counseled to monitor BP at home if symptomatic, document, and provide log at future appointments -Recommended to continue current medication  Hyperlipidemia: (LDL goal < 100) 03/08/22 Controlled -Current treatment: Rosuvastatin '10mg'$  daily Appropriate, Effective, Safe, Accessible -Medications previously tried: none noted  -Educated on Cholesterol goals;  Benefits of statin for ASCVD risk reduction; Importance of limiting foods high in cholesterol; -LDL well controlled based on most recent labs. Tolerating statin well, no changes needed at this time. Almost 1 year from last labs, due for recheck.  Patient to call and make FU visit with Dr. [name] for fasting labs to recheck lipids.  Update 09/02/21 LDL improved  substantially since starting Crestor. Most recent LDL was 64 in July.  He continues to take Crestor daily with no concerns. Encouraged continued adherence. Recheck lipids at next CPE. No changes to meds at this time.  Neuropathy (Goal: Minimize symptoms) 03/08/22 -Not ideally controlled -Current treatment  Lyrica '150mg'$  Bid Appropriate, Effective, Safe, Accessible Cymbalta '60mg'$  daily Appropriate, Effective, Safe,  Accessible -Medications previously tried: gabapentin -Pain still comes and goes, it is tolerable at the moment. -He did report that he started the capsaicin patches and has had the procedure done twice now.  They have worked fairly well, however, he has not decided if he will continue them yet.  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 No changes at this time, continue to use capsaicin if they are working. Does still need to taper off Cymbalta if he is ever considering stopping.  Update 09/02/21 Patient is tapering down on his Cymbalta currently at '40mg'$  daily with plans to decrease to '20mg'$  next week.  Hopes to taper off completely. He is going to try Qutenza patches in April with hopes of coming off all other medications completely.These are capsaicin patches that will last up to three months! No changes to meds at this time, continue tapering of Cymbalta.  Patient Goals/Self-Care Activities Patient will:  - take medications as prescribed focus on medication adherence by pill count check blood pressure when symptomatic, document, and provide at future appointments   Follow Up Plan: The care management team will reach out to the patient again over the next 1 year.           The patient verbalized understanding of instructions, educational materials, and care plan provided today and DECLINED offer to receive copy of patient instructions, educational materials, and care plan.  Telephone follow up appointment with pharmacy team member scheduled for: 1 year  Edythe Clarity, [place], PharmD, [place] Clinical Pharmacist Practitioner [place] [phone]

## 2022-03-15 ENCOUNTER — Other Ambulatory Visit: Payer: Self-pay | Admitting: Interventional Cardiology

## 2022-03-24 ENCOUNTER — Other Ambulatory Visit: Payer: Self-pay | Admitting: Family Medicine

## 2022-03-24 DIAGNOSIS — I10 Essential (primary) hypertension: Secondary | ICD-10-CM

## 2022-03-24 NOTE — Telephone Encounter (Signed)
Appointment 04/06/22- Rx extended Requested Prescriptions  Pending Prescriptions Disp Refills  . valsartan (DIOVAN) 160 MG tablet [Pharmacy Med Name: VALSARTAN 160 MG TABLET] 30 tablet 0    Sig: TAKE 1 TABLET BY MOUTH EVERY DAY     Cardiovascular:  Angiotensin Receptor Blockers Failed - 03/24/2022  9:30 AM      Failed - Cr in normal range and within 180 days    Creat  Date Value Ref Range Status  02/24/2021 1.07 0.70 - 1.18 mg/dL Final    Comment:    For patients >71 years of age, the reference limit for Creatinine is approximately 13% higher for people identified as African-American. .          Failed - K in normal range and within 180 days    Potassium  Date Value Ref Range Status  02/24/2021 4.4 3.5 - 5.3 mmol/L Final         Failed - Valid encounter within last 6 months    Recent Outpatient Visits          11 months ago Fever, unspecified fever cause   [place] [name], [name], MD   1 year ago Right sided sciatica   [place] Susy Frizzle, MD   1 year ago Skin tear of left hand without complication, initial encounter   [place] Eulogio Bear, NP   1 year ago Benign essential HTN   [place] [name], [name], MD   1 year ago Acute swimmer's ear of left side   [place] [name], [name], MD      Future Appointments            In 1 week [name], [name], MD [place], [place]   In 39 month [place], Charlann Lange, MD [place], [facility]           Passed - Patient is not pregnant      Passed - Last BP in normal range    BP Readings from Last 1 Encounters:  03/02/22 132/78

## 2022-03-29 ENCOUNTER — Other Ambulatory Visit: Payer: Self-pay | Admitting: Interventional Cardiology

## 2022-03-29 NOTE — Addendum Note (Signed)
Addended by: Colman Cater on: 03/29/2022 08:58 AM   Modules accepted: Orders

## 2022-03-30 ENCOUNTER — Other Ambulatory Visit: Payer: Medicare Other

## 2022-03-30 ENCOUNTER — Other Ambulatory Visit: Payer: Self-pay

## 2022-03-30 DIAGNOSIS — I10 Essential (primary) hypertension: Secondary | ICD-10-CM

## 2022-03-30 DIAGNOSIS — E041 Nontoxic single thyroid nodule: Secondary | ICD-10-CM

## 2022-03-31 LAB — COMPREHENSIVE METABOLIC PANEL
AG Ratio: 2.1 (calc) (ref 1.0–2.5)
ALT: 18 U/L (ref 9–46)
AST: 21 U/L (ref 10–35)
Albumin: 4.5 g/dL (ref 3.6–5.1)
Alkaline phosphatase (APISO): 63 U/L (ref 35–144)
BUN: 22 mg/dL (ref 7–25)
CO2: 29 mmol/L (ref 20–32)
Calcium: 9.7 mg/dL (ref 8.6–10.3)
Chloride: 103 mmol/L (ref 98–110)
Creat: 1.06 mg/dL (ref 0.70–1.28)
Globulin: 2.1 g/dL (calc) (ref 1.9–3.7)
Glucose, Bld: 119 mg/dL — ABNORMAL HIGH (ref 65–99)
Potassium: 4.6 mmol/L (ref 3.5–5.3)
Sodium: 143 mmol/L (ref 135–146)
Total Bilirubin: 0.5 mg/dL (ref 0.2–1.2)
Total Protein: 6.6 g/dL (ref 6.1–8.1)

## 2022-03-31 LAB — CBC WITH DIFFERENTIAL/PLATELET
Absolute Monocytes: 638 cells/uL (ref 200–950)
Basophils Absolute: 81 cells/uL (ref 0–200)
Basophils Relative: 1.4 %
Eosinophils Absolute: 412 cells/uL (ref 15–500)
Eosinophils Relative: 7.1 %
HCT: 46.2 % (ref 38.5–50.0)
Hemoglobin: 15.6 g/dL (ref 13.2–17.1)
Lymphs Abs: 1380 cells/uL (ref 850–3900)
MCH: 29.9 pg (ref 27.0–33.0)
MCHC: 33.8 g/dL (ref 32.0–36.0)
MCV: 88.7 fL (ref 80.0–100.0)
MPV: 10.6 fL (ref 7.5–12.5)
Monocytes Relative: 11 %
Neutro Abs: 3289 cells/uL (ref 1500–7800)
Neutrophils Relative %: 56.7 %
Platelets: 261 10*3/uL (ref 140–400)
RBC: 5.21 10*6/uL (ref 4.20–5.80)
RDW: 12.7 % (ref 11.0–15.0)
Total Lymphocyte: 23.8 %
WBC: 5.8 10*3/uL (ref 3.8–10.8)

## 2022-03-31 LAB — LIPID PANEL
Cholesterol: 145 mg/dL (ref <200)
HDL: 45 mg/dL (ref >=40)
LDL Cholesterol (Calc): 81 mg/dL (calc)
Non-HDL Cholesterol (Calc): 100 mg/dL (calc) (ref <130)
Total CHOL/HDL Ratio: 3.2 (calc) (ref <5.0)
Triglycerides: 99 mg/dL (ref <150)

## 2022-03-31 LAB — TSH: TSH: 0.65 mIU/L (ref 0.40–4.50)

## 2022-04-03 DIAGNOSIS — I10 Essential (primary) hypertension: Secondary | ICD-10-CM | POA: Diagnosis not present

## 2022-04-06 ENCOUNTER — Ambulatory Visit (INDEPENDENT_AMBULATORY_CARE_PROVIDER_SITE_OTHER): Payer: Medicare Other | Admitting: Family Medicine

## 2022-04-06 VITALS — BP 120/58 | HR 66 | Temp 98.3°F | Ht 69.0 in | Wt 232.0 lb

## 2022-04-06 DIAGNOSIS — Z0000 Encounter for general adult medical examination without abnormal findings: Secondary | ICD-10-CM | POA: Diagnosis not present

## 2022-04-06 DIAGNOSIS — R7303 Prediabetes: Secondary | ICD-10-CM | POA: Diagnosis not present

## 2022-04-06 DIAGNOSIS — I10 Essential (primary) hypertension: Secondary | ICD-10-CM | POA: Diagnosis not present

## 2022-04-06 DIAGNOSIS — G629 Polyneuropathy, unspecified: Secondary | ICD-10-CM | POA: Diagnosis not present

## 2022-04-06 MED ORDER — LEVOCETIRIZINE DIHYDROCHLORIDE 5 MG PO TABS
5.0000 mg | ORAL_TABLET | Freq: Every evening | ORAL | 11 refills | Status: DC
Start: 2022-04-06 — End: 2022-04-24

## 2022-04-06 NOTE — Progress Notes (Signed)
Subjective:    Patient ID: Robert Page, male    DOB: 01-14-1946, 76 y.o.   MRN: [id number]  HPI Patient is a very pleasant 76 year old Caucasian gentleman who presents today for complete physical exam.  Blood pressure is outstanding at 120/58.  Colonoscopy was performed in 2019 and is due again next year due to the history of tubular adenomas.  Recently had PSA checked at his urologist office and it was normal.  Recent blood work is listed below and is significant for prediabetic glucose but is otherwise outstanding.  He does report significant allergies and head congestion despite taking Flonase and Allegra on a daily basis Orders Only on 03/30/2022  Component Date Value Ref Range Status   TSH 03/30/2022 0.65  0.40 - 4.50 mIU/L Final   Cholesterol 03/30/2022 145  <200 mg/dL Final   HDL 03/30/2022 45  > OR = 40 mg/dL Final   Triglycerides 03/30/2022 99  <150 mg/dL Final   LDL Cholesterol (Calc) 03/30/2022 81  mg/dL (calc) Final   Comment: Reference range: <100 . Desirable range <100 mg/dL for primary prevention;   <70 mg/dL for patients with CHD or diabetic patients  with > or = 2 CHD risk factors. Marland Kitchen LDL-C is now calculated using the Martin-Hopkins  calculation, which is a validated novel method providing  better accuracy than the Friedewald equation in the  estimation of LDL-C.  Cresenciano Genre et al. Annamaria Helling. 5956;387(56): 2061-2068  (http://education.[facility].com/faq/FAQ164)    Total CHOL/HDL Ratio 03/30/2022 3.2  <5.0 (calc) Final   Non-HDL Cholesterol (Calc) 03/30/2022 100  <130 mg/dL (calc) Final   Comment: For patients with diabetes plus 1 major ASCVD risk  factor, treating to a non-HDL-C goal of <100 mg/dL  (LDL-C of <70 mg/dL) is considered a therapeutic  option.    Glucose, Bld 03/30/2022 119 (H)  65 - 99 mg/dL Final   Comment: .            Fasting reference interval . For someone without known diabetes, a glucose value between 100 and 125 mg/dL is consistent  with prediabetes and should be confirmed with a follow-up test. .    BUN 03/30/2022 22  7 - 25 mg/dL Final   Creat 03/30/2022 1.06  0.70 - 1.28 mg/dL Final   BUN/Creatinine Ratio 43/32/9518 NOT APPLICABLE  6 - 22 (calc) Final   Sodium 03/30/2022 143  135 - 146 mmol/L Final   Potassium 03/30/2022 4.6  3.5 - 5.3 mmol/L Final   Chloride 03/30/2022 103  98 - 110 mmol/L Final   CO2 03/30/2022 29  20 - 32 mmol/L Final   Calcium 03/30/2022 9.7  8.6 - 10.3 mg/dL Final   Total Protein 03/30/2022 6.6  6.1 - 8.1 g/dL Final   Albumin 03/30/2022 4.5  3.6 - 5.1 g/dL Final   Globulin 03/30/2022 2.1  1.9 - 3.7 g/dL (calc) Final   AG Ratio 03/30/2022 2.1  1.0 - 2.5 (calc) Final   Total Bilirubin 03/30/2022 0.5  0.2 - 1.2 mg/dL Final   Alkaline phosphatase (APISO) 03/30/2022 63  35 - 144 U/L Final   AST 03/30/2022 21  10 - 35 U/L Final   ALT 03/30/2022 18  9 - 46 U/L Final   WBC 03/30/2022 5.8  3.8 - 10.8 Thousand/uL Final   RBC 03/30/2022 5.21  4.20 - 5.80 Million/uL Final   Hemoglobin 03/30/2022 15.6  13.2 - 17.1 g/dL Final   HCT 03/30/2022 46.2  38.5 - 50.0 % Final   MCV 03/30/2022  88.7  80.0 - 100.0 fL Final   MCH 03/30/2022 29.9  27.0 - 33.0 pg Final   MCHC 03/30/2022 33.8  32.0 - 36.0 g/dL Final   RDW 03/30/2022 12.7  11.0 - 15.0 % Final   Platelets 03/30/2022 261  140 - 400 Thousand/uL Final   MPV 03/30/2022 10.6  7.5 - 12.5 fL Final   Neutro Abs 03/30/2022 3,289  1,500 - 7,800 cells/uL Final   Lymphs Abs 03/30/2022 1,380  850 - 3,900 cells/uL Final   Absolute Monocytes 03/30/2022 638  200 - 950 cells/uL Final   Eosinophils Absolute 03/30/2022 412  15 - 500 cells/uL Final   Basophils Absolute 03/30/2022 81  0 - 200 cells/uL Final   Neutrophils Relative % 03/30/2022 56.7  % Final   Total Lymphocyte 03/30/2022 23.8  % Final   Monocytes Relative 03/30/2022 11.0  % Final   Eosinophils Relative 03/30/2022 7.1  % Final   Basophils Relative 03/30/2022 1.4  % Final    Immunization History   Administered Date(s) Administered   Fluad Quad(high Dose 65+) 08/10/2020   H1N1 09/23/2008   Influenza Split 06/04/2014   Influenza,inj,Quad PF,6+ Mos 08/08/2013, 05/17/2015, 10/17/2016   PFIZER(Purple Top)SARS-COV-2 Vaccination 09/25/2019, 10/16/2019, 08/30/2020   Pneumococcal Conjugate-13 10/17/2016   Pneumococcal Polysaccharide-23 03/11/2013   Td 03/04/2004   Tdap 10/17/2016    Past Medical History:  Diagnosis Date   Adenomatous polyp    Allergy    Atrial tachycardia ([place])    Diverticulosis    Dupuytren's disease    palms and soles   GERD (gastroesophageal reflux disease)    Hypertension    Past Surgical History:  Procedure Laterality Date   BACK SURGERY     12/2013   CARDIAC CATHETERIZATION  1990's   COLECTOMY  2010   for diverticulitis   COLON SURGERY N/A    [facility] 08/27/2020   COLONOSCOPY  04/03/2013   LASIK  2002   Bil   POLYPECTOMY     Current Outpatient Medications on File Prior to Visit  Medication Sig Dispense Refill   amLODipine (NORVASC) 5 MG tablet TAKE 1 TABLET BY MOUTH EVERY DAY 90 tablet 3   aspirin EC 81 MG tablet Take 81 mg by mouth daily.     Capsaicin 0.1 % CREA Apply twice a day to feet 60 g 1   diphenhydramine-acetaminophen (TYLENOL PM) 25-500 MG TABS tablet Take 1 tablet by mouth at bedtime as needed.     DULoxetine (CYMBALTA) 60 MG capsule Take 1 capsule (60 mg total) by mouth daily. 90 capsule 3   fexofenadine (ALLEGRA) 180 MG tablet Take 180 mg by mouth daily.     fluticasone (FLONASE) 50 MCG/ACT nasal spray SPRAY 2 SPRAYS INTO EACH NOSTRIL EVERY DAY 48 mL 0   hydrochlorothiazide (HYDRODIURIL) 25 MG tablet TAKE 1 TABLET (25 MG TOTAL) BY MOUTH DAILY. 90 tablet 3   omeprazole (PRILOSEC) 20 MG capsule Take 20 mg by mouth daily.     pregabalin (LYRICA) 150 MG capsule Take 1 capsule (150 mg total) by mouth 2 (two) times daily. 60 capsule 3   rosuvastatin (CRESTOR) 10 MG tablet TAKE 1 TABLET (10 MG TOTAL) BY MOUTH DAILY. APPOINTMENT NEEDED FOR  FURTHER REFILLS 30 tablet 0   valsartan (DIOVAN) 160 MG tablet TAKE 1 TABLET BY MOUTH EVERY DAY 30 tablet 0   amitriptyline (ELAVIL) 10 MG tablet Take 0.5 tablets (5 mg total) by mouth at bedtime. (Patient not taking: Reported on 04/06/2022) 15 tablet 1   No  current facility-administered medications on file prior to visit.   Allergies  Allergen Reactions   Ibuprofen Other (See Comments)    stomach cramps  Other reaction(s): Unknown   Social History   Socioeconomic History   Marital status: Married    Spouse name: Wyat Infinger   Number of children: 3   Years of education: Not on file   Highest education level: Not on file  Occupational History   Occupation: Retired  Tobacco Use   Smoking status: Former    Types: Cigars    Quit date: 2015    Years since quitting: 8.5   Smokeless tobacco: Never   Tobacco comments:    Pt states smokes occasional cigar.  This is typically < 1 time per month  Vaping Use   Vaping Use: Never used  Substance and Sexual Activity   Alcohol use: No    Alcohol/week: 0.0 standard drinks of alcohol   Drug use: No   Sexual activity: Not on file  Other Topics Concern   Not on file  Social History Narrative   Pt lives in [place].     Retired from [employer] and [employer].     Goes on mission trips to [place], gets regular exercise.   Caffeine use: very rare, Diet coke   Right handed    Social Determinants of Health   Financial Resource Strain: Low Risk  (09/01/2021)   Overall Financial Resource Strain (CARDIA)    Difficulty of Paying Living Expenses: Not hard at all  Food Insecurity: No Food Insecurity (09/01/2021)   Hunger Vital Sign    Worried About Running Out of Food in the Last Year: Never true    Ran Out of Food in the Last Year: Never true  Transportation Needs: No Transportation Needs (09/01/2021)   PRAPARE - Hydrologist (Medical): No    Lack of Transportation (Non-Medical): No  Physical Activity:  Sufficiently Active (09/01/2021)   Exercise Vital Sign    Days of Exercise per Week: 5 days    Minutes of Exercise per Session: 30 min  Stress: No Stress Concern Present (09/01/2021)   [place]    Feeling of Stress : Not at all  Social Connections: [place] (09/01/2021)   Social Connection and Isolation Panel [NHANES]    Frequency of Communication with Friends and Family: More than three times a week    Frequency of Social Gatherings with Friends and Family: Three times a week    Attends Religious Services: More than 4 times per year    Active Member of Clubs or Organizations: Yes    Attends Archivist Meetings: More than 4 times per year    Marital Status: Married  Human resources officer Violence: Not At Risk (09/01/2021)   Humiliation, Afraid, Rape, and Kick questionnaire    Fear of Current or Ex-Partner: No    Emotionally Abused: No    Physically Abused: No    Sexually Abused: No   Family History  Problem Relation Age of Onset   Hypertension Mother    Macular degeneration Mother    Kidney disease Mother    Dementia Mother    Hypertension Father    Melanoma Sister    Stroke Other        uncle   Prostate cancer Other        uncle   Colon cancer Other        grandmother/uncle   Kidney  disease Other        grandmother   Liver cancer Other        uncle (mets)   Heart disease Other        aunt/uncle   Lung cancer Other        uncle   Stomach cancer Cousin    Heart disease Cousin    Colon cancer Paternal Grandmother    Colon cancer Paternal Uncle    Prostate cancer Paternal Uncle    Arthritis Brother    Hypertension Brother    Esophageal cancer Neg Hx    Rectal cancer Neg Hx      Review of Systems  All other systems reviewed and are negative.      Objective:   Physical Exam Vitals reviewed.  Constitutional:      General: He is not in acute distress.    Appearance: He is  well-developed. He is not diaphoretic.  HENT:     Head: Normocephalic and atraumatic.     Right Ear: External ear normal.     Left Ear: External ear normal.     Nose: Nose normal.     Mouth/Throat:     Pharynx: No oropharyngeal exudate.  Eyes:     General: No scleral icterus.       Right eye: No discharge.        Left eye: No discharge.     Conjunctiva/sclera: Conjunctivae normal.     Pupils: Pupils are equal, round, and reactive to light.  Neck:     Thyroid: No thyromegaly.     Vascular: No JVD.     Trachea: No tracheal deviation.  Cardiovascular:     Rate and Rhythm: Normal rate and regular rhythm.     Heart sounds: Normal heart sounds. No murmur heard.    No friction rub. No gallop.  Pulmonary:     Effort: Pulmonary effort is normal. No respiratory distress.     Breath sounds: Normal breath sounds. No stridor. No wheezing or rales.  Chest:     Chest wall: No tenderness.  Abdominal:     General: Bowel sounds are normal. There is no distension.     Palpations: Abdomen is soft. There is no mass.     Tenderness: There is no abdominal tenderness. There is no guarding or rebound.  Musculoskeletal:        General: No tenderness. Normal range of motion.     Cervical back: Normal range of motion and neck supple.  Lymphadenopathy:     Cervical: No cervical adenopathy.  Skin:    General: Skin is warm.     Coloration: Skin is not pale.     Findings: No erythema or rash.  Neurological:     Mental Status: He is alert and oriented to person, place, and time.     Cranial Nerves: No cranial nerve deficit.     Motor: No abnormal muscle tone.     Coordination: Coordination normal.     Deep Tendon Reflexes: Reflexes are normal and symmetric.  Psychiatric:        Behavior: Behavior normal.        Thought Content: Thought content normal.        Judgment: Judgment normal.         Assessment & Plan:  General medical exam  Benign essential HTN  Polyneuropathy  Prediabetes Lab  work is outstanding except for prediabetic blood sugar.  Blood pressure is excellent.  Cancer screening is up-to-date.  Recommended  a low carbohydrate diet.  Recommended limiting carbs to less than 45 g per meal.  Try to increase aerobic exercise and work on weight loss.  Regular anticipatory guidance is provided.  Recommended trying Xyzal 5 mg daily in place of Allegra

## 2022-04-10 ENCOUNTER — Other Ambulatory Visit: Payer: Self-pay | Admitting: Family Medicine

## 2022-04-21 ENCOUNTER — Other Ambulatory Visit: Payer: Self-pay | Admitting: Family Medicine

## 2022-04-21 DIAGNOSIS — I10 Essential (primary) hypertension: Secondary | ICD-10-CM

## 2022-04-23 NOTE — Progress Notes (Signed)
Cardiology Office Note   Date:  04/23/2022   ID:  Robert Page, DOB 1946/06/10, MRN [id number]  PCP:  Susy Frizzle, MD    No chief complaint on file.  Coronary artery calcification  Wt Readings from Last 3 Encounters:  04/06/22 232 lb (105.2 kg)  03/01/22 234 lb 12.6 oz (106.5 kg)  02/28/22 234 lb 12.8 oz (106.5 kg)       History of Present Illness: Robert Page is a 76 y.o. male  Who has a past medical history significant for GERD, hypertension, atrial tachycardia.   The patient saw Dr. Rayann Heman in 2014 for "nonsustained SVT suspected to be atrial tachycardia".  As of 12/2012 his symptoms had markedly improved.  He was treated briefly with diltiazem but with improvement in his symptoms he stopped that.  An echocardiogram on 10/30/2012 showed normal LV systolic function with a EF 65%, bilateral atrial dilatation.  He also had a low risk Myoview.  He declined consideration of ablation or addition of medications. He has had no significant palpitations since then.    He quit smoking cigars.   Echo in 4/19 for DOE showed: Left ventricle: The cavity size was normal. There was mild   concentric hypertrophy. Systolic function was normal. The   estimated ejection fraction was in the range of 60% to 65%. Wall   motion was normal; there were no regional wall motion   abnormalities. Left ventricular diastolic function parameters   were normal. - Aortic valve: Transvalvular velocity was within the normal range.   There was no stenosis. There was no regurgitation. - Aorta: Ascending aortic diameter: 37 mm (S). - Ascending aorta: The ascending aorta was mildly dilated. - Mitral valve: Transvalvular velocity was within the normal range.   There was no evidence for stenosis. There was trivial   regurgitation. - Right ventricle: The cavity size was normal. Wall thickness was   normal. Systolic function was normal. - Atrial septum: No defect or patent foramen ovale was identified    by color flow Doppler. - Tricuspid valve: There was mild regurgitation. - Pulmonary arteries: Systolic pressure was within the normal   range. PA peak pressure: 25 mm Hg (S).   CT aorta in 4/19: 1. Normal caliber thoracic aorta without evidence of aneurysmal disease. Maximal caliber of the aorta is 3.5 cm at the level of the tubular ascending aorta. Mild atherosclerosis present at the level of the aortic arch. 2. Calcified plaque in the distribution of the LAD. 3. Stable 5 mm subpleural right lower lobe lung nodule. This nodule has been stable for nearly 3 years and is therefore benign.   In 2019, he had perhaps 2 episodes of tachycardia.  THey lasted up to 5 seconds.  He was not bothered by it at all.     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  He does a lot of lifting.  He has a nordic-trac, but has not been using it.  Foot pain is a limitation.   Mother passed away at age 68- dementia/stroke.  Father still alive at 21.    He continues to work on his farm. He feels some DOE with walking.  He does not do any regular walking due to neuropathy.    Denies : Chest pain. Dizziness. Leg edema. Nitroglycerin use. Orthopnea. Palpitations. Paroxysmal nocturnal dyspnea. Syncope.    Feels some GERD sx. No related to activity.  Relieved with TUMS.  Occasional right arm pain.  Occurs from the  right pinky to the right elbow.  Not related to exertion.    Past Medical History:  Diagnosis Date   Adenomatous polyp    Allergy    Atrial tachycardia ([place])    Diverticulosis    Dupuytren's disease    palms and soles   GERD (gastroesophageal reflux disease)    Hypertension     Past Surgical History:  Procedure Laterality Date   BACK SURGERY     12/2013   CARDIAC CATHETERIZATION  1990's   COLECTOMY  2010   for diverticulitis   COLON SURGERY N/A    [facility] 08/27/2020   COLONOSCOPY  04/03/2013   LASIK  2002   Bil   POLYPECTOMY       Current Outpatient Medications  Medication Sig  Dispense Refill   amitriptyline (ELAVIL) 10 MG tablet Take 0.5 tablets (5 mg total) by mouth at bedtime. (Patient not taking: Reported on 04/06/2022) 15 tablet 1   amLODipine (NORVASC) 5 MG tablet TAKE 1 TABLET BY MOUTH EVERY DAY 90 tablet 3   aspirin EC 81 MG tablet Take 81 mg by mouth daily.     Capsaicin 0.1 % CREA Apply twice a day to feet 60 g 1   diphenhydramine-acetaminophen (TYLENOL PM) 25-500 MG TABS tablet Take 1 tablet by mouth at bedtime as needed.     DULoxetine (CYMBALTA) 60 MG capsule Take 1 capsule (60 mg total) by mouth daily. 90 capsule 3   fexofenadine (ALLEGRA) 180 MG tablet Take 180 mg by mouth daily.     fluticasone (FLONASE) 50 MCG/ACT nasal spray SPRAY 2 SPRAYS INTO EACH NOSTRIL EVERY DAY 48 mL 3   hydrochlorothiazide (HYDRODIURIL) 25 MG tablet TAKE 1 TABLET (25 MG TOTAL) BY MOUTH DAILY. 90 tablet 3   levocetirizine (XYZAL ALLERGY 24HR) 5 MG tablet Take 1 tablet (5 mg total) by mouth every evening. 30 tablet 11   omeprazole (PRILOSEC) 20 MG capsule Take 20 mg by mouth daily.     pregabalin (LYRICA) 150 MG capsule Take 1 capsule (150 mg total) by mouth 2 (two) times daily. 60 capsule 3   rosuvastatin (CRESTOR) 10 MG tablet TAKE 1 TABLET (10 MG TOTAL) BY MOUTH DAILY. APPOINTMENT NEEDED FOR FURTHER REFILLS 30 tablet 0   valsartan (DIOVAN) 160 MG tablet TAKE 1 TABLET BY MOUTH EVERY DAY 30 tablet 3   No current facility-administered medications for this visit.    Allergies:   Ibuprofen    Social History:  The patient  reports that he quit smoking about 8 years ago. His smoking use included cigars. He has never used smokeless tobacco. He reports that he does not drink alcohol and does not use drugs.   Family History:  The patient's family history includes Arthritis in his brother; Colon cancer in his paternal grandmother, paternal uncle, and another family member; Dementia in his mother; Heart disease in his cousin and another family member; Hypertension in his brother,  father, and mother; Kidney disease in his mother and another family member; Liver cancer in an other family member; Lung cancer in an other family member; Macular degeneration in his mother; Melanoma in his sister; Prostate cancer in his paternal uncle and another family member; Stomach cancer in his cousin; Stroke in an other family member.    ROS:  Please see the history of present illness.   Otherwise, review of systems are positive for right arm pain.   All other systems are reviewed and negative.    PHYSICAL EXAM: VS:  There were  no vitals taken for this visit. , BMI There is no height or weight on file to calculate BMI. GEN: Well nourished, well developed, in no acute distress HEENT: normal Neck: no JVD, carotid bruits, or masses Cardiac: RRR; no murmurs, rubs, or gallops,no edema  Respiratory:  clear to auscultation bilaterally, normal work of breathing GI: soft, nontender, nondistended, + BS MS: no deformity or atrophy Skin: warm and dry, no rash Neuro:  Strength and sensation are intact Psych: euthymic mood, full affect   EKG:   The ekg ordered today demonstrates no change from prior ECG in 2022.  NSR   Recent Labs: 03/30/2022: ALT 18; BUN 22; Creat 1.06; Hemoglobin 15.6; Platelets 261; Potassium 4.6; Sodium 143; TSH 0.65   Lipid Panel    Component Value Date/Time   CHOL 145 03/30/2022 0810   CHOL 134 03/17/2021 0733   TRIG 99 03/30/2022 0810   HDL 45 03/30/2022 0810   HDL 51 03/17/2021 0733   CHOLHDL 3.2 03/30/2022 0810   VLDL 15 11/13/2016 0854   LDLCALC 81 03/30/2022 0810     Other studies Reviewed: Additional studies/ records that were reviewed today with results demonstrating: labs reviewed.   ASSESSMENT AND PLAN:  Coronary artery calcification: No clear angina.  Negative stress test in 2014.  Given worsening dyspnea on exertion, will plan for PET CT scan to look for ischemia and get LVEF measurement.  Could be component of deconditioning.  He is not getting  any sustained, cardio activity.  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  Foot pain limits with sustained walking. SVT: No palpitations.  Carotid artery disease: Minimal atherosclerosis in 2018.  Hyperlipidemia: LDL 81.  Continue current medications.  Healthy diet recommended as well. I suggested trying a recumbent bike to help with increasing stamina.    Current medicines are reviewed at length with the patient today.  The patient concerns regarding his medicines were addressed.  The following changes have been made:  No change  Labs/ tests ordered today include:  No orders of the defined types were placed in this encounter.   Recommend 150 minutes/week of aerobic exercise Low fat, low carb, high fiber diet recommended  Disposition:   FU in 1 year, for stress test   Signed, Larae Grooms, MD  04/23/2022 10:07 PM    [facility] [address], [place], [place]  [address] Phone: [phone]; Fax: [phone]

## 2022-04-24 ENCOUNTER — Ambulatory Visit: Payer: Medicare Other | Admitting: Interventional Cardiology

## 2022-04-24 ENCOUNTER — Encounter: Payer: Self-pay | Admitting: Interventional Cardiology

## 2022-04-24 VITALS — BP 118/72 | HR 72 | Ht 69.5 in | Wt 233.0 lb

## 2022-04-24 DIAGNOSIS — I6523 Occlusion and stenosis of bilateral carotid arteries: Secondary | ICD-10-CM

## 2022-04-24 DIAGNOSIS — E785 Hyperlipidemia, unspecified: Secondary | ICD-10-CM

## 2022-04-24 DIAGNOSIS — I471 Supraventricular tachycardia: Secondary | ICD-10-CM

## 2022-04-24 DIAGNOSIS — R072 Precordial pain: Secondary | ICD-10-CM

## 2022-04-24 DIAGNOSIS — I2510 Atherosclerotic heart disease of native coronary artery without angina pectoris: Secondary | ICD-10-CM | POA: Diagnosis not present

## 2022-04-24 DIAGNOSIS — R0609 Other forms of dyspnea: Secondary | ICD-10-CM

## 2022-04-24 NOTE — Patient Instructions (Addendum)
Medication Instructions:  Your physician recommends that you continue on your current medications as directed. Please refer to the Current Medication list given to you today.  *If you need a refill on your cardiac medications before your next appointment, please call your pharmacy*   Lab Work: None If you have labs (blood work) drawn today and your tests are completely normal, you will receive your results only by: [place] (if you have MyChart) OR A paper copy in the mail If you have any lab test that is abnormal or we need to change your treatment, we will call you to review the results.   Testing/Procedures: Cardiac PET CT - See Instructions Below   Follow-Up: At [facility], you and your health needs are our priority.  As part of our continuing mission to provide you with exceptional heart care, we have created designated Provider Care Teams.  These Care Teams include your primary Cardiologist (physician) and Advanced Practice Providers (APPs -  Physician Assistants and Nurse Practitioners) who all work together to provide you with the care you need, when you need it.  Your next appointment:   1 year(s)  The format for your next appointment:   In Person  Provider:   Larae Grooms, MD {   Other Instructions How to Prepare for Your Cardiac PET/CT Stress Test:  1. Please do not take these medications before your test:   Medications that may interfere with the cardiac pharmacological stress agent (ex. nitrates - including erectile dysfunction medications or beta-blockers) the day of the exam. (Erectile dysfunction medication should be held for at least 72 hrs prior to test) Theophylline containing medications for 12 hours. Dipyridamole 48 hours prior to the test. Your remaining medications may be taken with water.  2. Nothing to eat or drink, except water, 3 hours prior to arrival time.   NO caffeine/decaffeinated products, or chocolate 12 hours prior to  arrival.  3. NO perfume, cologne or lotion  4. Total time is 1 to 2 hours; you may want to bring reading material for the waiting time.  5. Please report to Admitting at the [place] Entrance 60 minutes early for your test.  [place], [place] [address]   In preparation for your appointment, medication and supplies will be purchased.  Appointment availability is limited, so if you need to cancel or reschedule, please call the Radiology Department at [phone]  24 hours in advance to avoid a cancellation fee of $100.00  What to Expect After you Arrive:  Once you arrive and check in for your appointment, you will be taken to a preparation room within the Radiology Department.  A technologist or Nurse will obtain your medical history, verify that you are correctly prepped for the exam, and explain the procedure.  Afterwards,  an IV will be started in your arm and electrodes will be placed on your skin for EKG monitoring during the stress portion of the exam. Then you will be escorted to the PET/CT scanner.  There, staff will get you positioned on the scanner and obtain a blood pressure and EKG.  During the exam, you will continue to be connected to the EKG and blood pressure machines.  A small, safe amount of a radioactive tracer will be injected in your IV to obtain a series of pictures of your heart along with an injection of a stress agent.    After your Exam:  It is recommended that you eat a meal  and drink a caffeinated beverage to counter act any effects of the stress agent.  Drink plenty of fluids for the remainder of the day and urinate frequently for the first couple of hours after the exam.  Your doctor will inform you of your test results within 7-10 business days.  For questions about your test or how to prepare for your test, please call: Marchia Bond, Cardiac Imaging Nurse Navigator  Gordy Clement, Cardiac Imaging Nurse Navigator Office:  [phone]   Important Information About Sugar

## 2022-05-19 ENCOUNTER — Other Ambulatory Visit: Payer: Self-pay | Admitting: Interventional Cardiology

## 2022-05-23 ENCOUNTER — Encounter: Payer: Self-pay | Admitting: Physical Medicine and Rehabilitation

## 2022-05-23 ENCOUNTER — Encounter
Payer: Medicare Other | Attending: Physical Medicine and Rehabilitation | Admitting: Physical Medicine and Rehabilitation

## 2022-05-23 VITALS — BP 118/76 | HR 66 | Temp 97.9°F | Ht 69.5 in | Wt 231.0 lb

## 2022-05-23 DIAGNOSIS — G629 Polyneuropathy, unspecified: Secondary | ICD-10-CM | POA: Insufficient documentation

## 2022-05-23 DIAGNOSIS — G4701 Insomnia due to medical condition: Secondary | ICD-10-CM | POA: Insufficient documentation

## 2022-05-23 MED ORDER — DULOXETINE HCL 60 MG PO CPEP: 60.0000 mg | ORAL_CAPSULE | Freq: Every day | ORAL | 3 refills | Status: DC

## 2022-05-23 MED ORDER — PREGABALIN 150 MG PO CAPS
150.0000 mg | ORAL_CAPSULE | Freq: Two times a day (BID) | ORAL | 3 refills | Status: DC
Start: 2022-05-23 — End: 2022-11-13

## 2022-05-24 NOTE — Progress Notes (Signed)
Subjective:    Patient ID: Robert Page, male    DOB: [birth year], 76 y.o.   MRN: [id number]  HPI Male with pmh/psh of polyneuropathy, atrial tachycardia, back surgery (?laminectomy L4-5), left foot surgery (tumor removal x4), dupuytens (palms and soles), HTN, GERD presents for follow-up of bilateral neuropathy.  Initially stated: Started summer 2000.  He had foot surgery in left foot in ~2016. Progressing.  Radiating proximally.  No symptoms in hands.  Ambulating and activity improves the pain. Shoes and inactivity exacerbate the pain. Hot and tingling/burning.  He notes I am the 6th doctor he's been to. He saw PCP and Ortho, who did MRI and told non-surgical.  He was referred to Rheum, who did not believe it was RA and thought it was L4-5 radic.  He was referred to Neurology, who ordered NCV/EMG (showing polyneuropathy, left peroneal neuropathy, Left L5/S1 radic) and MRI.  MRI C-spine showing mild disc bulges C3-6. He had MRI L-spine.  He saw Neurosurg, who did Myelogram and was told symptoms not originating in back.  Constant.  Associated numbness.  Gabapentin, Lyrica, Cymbalta did not help. He was told he has tried "every single medication for neuropathy" without benefit. He notes improvement when he sleeps in a different bed.  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 Pain does not limit activities.   Since that time, patient states he has not tried heat. He states he had burning with Capsacin. He obtained Vitamin levels. Sleep has improved.  Patient states he was losing weight, but gained in on vacation. Patient states he often does not have time to take care of his body, take meds consistently, etc.   -He is taking Lyrica and Cymbalta. He has tightness and stiffness in both lower extremities. He had a dose of steroid dose pack that did provide some relief to his back pain. If he is barefoot in the shower  he feels off balance. He saw Dr. Doran Durand at [facility]. He has had prior foot surgery- was told he had arthritis. He saw rheumatology and was told he does not have rheumatoid arthritis. He had EMG/NCS (results above). -he asks for refill of Lyrica and Cymbalta   Both feet are really stiff He is ready to try Qutenza today, he received minimal benefit with first treatment Discussed trying longer duration to 35 minutes today since he did not have any burning with prior treatment and he is agreeable to this Did not require ice packs during treatment today, but toward end of treatment developed burning on dorsum of right foot Pain can be very severe at night, this is when pain continues to be worse Needs medication refills today It negatively affects his sleep He feels burning pain worst on tops of both feet but also present in soles He also has pain radiating from his back into his right shin He is ok on his medicines- no refills needed right now He is not sure if Lyrica or Cymbalta are helping. He does not want to take things that are not needed  Pain Inventory Average Pain 5 Pain Right Now 3 My pain is burning at times, stabbing, aching  In the last 24 hours, has pain interfered with the following? General activity 1 Relation with others 2 Enjoyment of life 4 What TIME of day is your pain at its worst? Daytime and evening Sleep (in general) Good  Pain is worse with: sitting, standing, some activities Pain improves with: rest, possibly medication Relief from Meds: 1   Family History  Problem Relation Age of Onset   Hypertension Mother    Macular degeneration Mother    Kidney disease Mother    Dementia Mother    Hypertension Father    Melanoma Sister    Stroke Other        uncle   Prostate cancer Other        uncle   Colon cancer Other        grandmother/uncle   Kidney disease Other        grandmother   Liver cancer Other        uncle (mets)   Heart disease Other         aunt/uncle   Lung cancer Other        uncle   Stomach cancer Cousin    Heart disease Cousin    Colon cancer Paternal Grandmother    Colon cancer Paternal Uncle    Prostate cancer Paternal Uncle    Arthritis Brother    Hypertension Brother    Esophageal cancer Neg Hx    Rectal cancer Neg Hx    Social History   Socioeconomic History   Marital status: Married    Spouse name: Arles Rumbold   Number of children: 3   Years of education: Not on file   Highest education level: Not on file  Occupational History   Occupation: Retired  Tobacco Use   Smoking status: Former    Types: Cigars    Quit date: 2015    Years since quitting: 8.7   Smokeless tobacco: Never   Tobacco comments:    Pt states smokes occasional cigar.  This is typically < 1 time per month  Vaping Use   Vaping Use: Never used  Substance and Sexual Activity   Alcohol use: No    Alcohol/week: 0.0 standard drinks of alcohol   Drug use: No   Sexual activity: Not on file  Other Topics Concern   Not on file  Social History Narrative   Pt lives in [place].     Retired from [employer] and [employer].     Goes on mission trips to [place], gets regular exercise.   Caffeine use: very rare, Diet coke   Right handed    Social Determinants of Health   Financial Resource Strain: Low Risk  (09/01/2021)   Overall Financial Resource Strain (CARDIA)    Difficulty of Paying Living Expenses: Not hard at all  Food Insecurity: No Food Insecurity (09/01/2021)   Hunger Vital Sign    Worried About Running Out of Food in the Last Year: Never true    Ran Out of Food in the Last Year: Never true  Transportation Needs: No Transportation Needs (09/01/2021)   PRAPARE - Hydrologist (Medical): No    Lack of Transportation (Non-Medical): No  Physical Activity: Sufficiently Active (09/01/2021)   Exercise Vital Sign    Days of Exercise per Week: 5 days    Minutes of Exercise per Session: 30 min   Stress: No Stress Concern Present (09/01/2021)   [place]    Feeling of Stress : Not at all  Social Connections: [place] (09/01/2021)   Social Connection and Isolation Panel [NHANES]    Frequency of Communication with Friends and Family: More than three times a week  Frequency of Social Gatherings with Friends and Family: Three times a week    Attends Religious Services: More than 4 times per year    Active Member of Clubs or Organizations: Yes    Attends Archivist Meetings: More than 4 times per year    Marital Status: Married   Past Surgical History:  Procedure Laterality Date   BACK SURGERY     12/2013   CARDIAC CATHETERIZATION  1990's   COLECTOMY  2010   for diverticulitis   COLON SURGERY N/A    [employer] 08/27/2020   COLONOSCOPY  04/03/2013   LASIK  2002   [place]   POLYPECTOMY     Past Medical History:  Diagnosis Date   Adenomatous polyp    Allergy    Atrial tachycardia ([place])    Diverticulosis    Dupuytren's disease    palms and soles   GERD (gastroesophageal reflux disease)    Hypertension    BP 118/76   Pulse 66   Temp 97.9 F (36.6 C)   Ht 5' 9.5" (1.765 m)   Wt 231 lb (104.8 kg)   SpO2 95%   BMI 33.62 kg/m   Opioid Risk Score:   Fall Risk Score:  `1  Depression screen [facility] 2/9     05/23/2022   11:10 AM 04/06/2022    2:56 PM 11/22/2021    1:54 PM 09/01/2021   11:58 AM 08/09/2021   11:29 AM 03/11/2021    9:47 AM 12/06/2020    9:06 AM  Depression screen PHQ 2/9  Decreased Interest 0 0 0 0 0 0 0  Down, Depressed, Hopeless 0 0 0 0 0 0 0  PHQ - 2 Score 0 0 0 0 0 0 0  Altered sleeping  0       Tired, decreased energy  2       Change in appetite  0       Feeling bad or failure about yourself   0       Trouble concentrating  0       Moving slowly or fidgety/restless  0       Suicidal thoughts  0       PHQ-9 Score  2       Difficult doing work/chores  Not difficult  at all        Review of Systems  Constitutional: Negative.   HENT: Negative.    Eyes: Negative.   Respiratory: Negative.    Cardiovascular: Negative.   Gastrointestinal: Negative.   Endocrine: Negative.   Genitourinary: Negative.   Musculoskeletal:  Positive for gait problem.  Skin: Negative.   Allergic/Immunologic: Negative.   Hematological: Negative.   Psychiatric/Behavioral: Negative.        Objective:   Physical Exam  Gen: no distress, normal appearing, BMI 33.62, weight 231 lbs, BP 118 HEENT: oral mucosa pink and moist, NCAT Cardio: Reg rate Chest: normal effort, normal rate of breathing Abd: soft, non-distended Ext: no edema Psych: pleasant, normal affect Skin: intact, no open lesions Neuro: HOH Motor: B/l LE: 5/5 throughout Sensation diminished to light touch b/l LE on soles of both feet Vibration diminished in b/l LE knees and distally, intact UE (previously)    Assessment & Plan:  Male with pmh/psh ofpolyneuropathy, atrial tachycardia, back surgery (?laminectomy L4-5), left foot surgery (tumor removal x4), dupuytens (palms and soles), HTN, GERD presents for f/u b/l feet pain.  1. Chronic bilateral feet pain -   MRI foot report unremarkable.  MRI back report showing ? L5-S1 radic  NCS/EMG - showing polyneuropathy and L5-SI radic on left, results reviewed.   No benefit with Gabapentin  Trial Heat, reminded again  Will consider PT  Discussing purchasing TENS unit from [facility]  Trial OTC Lidocaine patch  Continue Lyrica 150 TID  Will consider referral to Neurosurg, pt would like to hold off at present  Will consider ESI, pt would like to hold off at present  Will consider PNS  Retry Capsacin, educated again  Refilled Cymbalta  -Discussed Qutenza as an option for neuropathic pain control. Discussed that this is a capsaicin patch, stronger than capsaicin cream. Discussed that it is currently approved for diabetic peripheral neuropathy and post-herpetic neuralgia,  but that it has also shown benefit in treating other forms of neuropathy. Provided patient with link to site to learn more about the patch: CinemaBonus.fr. Discussed that the patch would be placed in office and benefits usually last 3 months. Discussed that unintended exposure to capsaicin can cause severe irritation of eyes, mucous membranes, respiratory tract, and skin, but that Qutenza is a local treatment and does not have the systemic side effects of other nerve medications. Discussed that there may be pain, itching, erythema, and decreased sensory function associated with the application of Qutenza. Side effects usually subside within 1 week. A cold pack of analgesic medications can help with these side effects. Blood pressure can also be increased due to pain associated with administration of the patch. .   4 patches of Qutenza was applied to the area of pain. Ice packs were applied during the procedure to ensure patient comfort. Blood pressure was monitored every 15 minutes. The patient tolerated the procedure well. Post-procedure instructions were given and follow-up has been scheduled.    Patient states main goal is to be pain free - improving  Educated on importance of caring for self. -Discussed current symptoms of pain and history of pain.  -Discussed benefits of exercise in reducing pain. -Discussed following foods that may reduce pain: 1) Ginger (especially studied for arthritis)- reduce leukotriene production to decrease inflammation 2) Blueberries- high in phytonutrients that decrease inflammation 3) Salmon- marine omega-3s reduce joint swelling and pain 4) Pumpkin seeds- reduce inflammation 5) dark chocolate- reduces inflammation 6) turmeric- reduces inflammation 7) tart cherries - reduce pain and stiffness 8) extra virgin olive oil - its compound olecanthal helps to block prostaglandins  9) chili peppers- can be eaten or applied topically via capsaicin 10) mint- helpful  for headache, muscle aches, joint pain, and itching 11) garlic- reduces inflammation  Link to further information on diet for chronic pain: http://www.randall.com/    2. Sleep disturbance  Discontinue amitriptyline  Discussed that Qutenza has been particularly shown to help with painful neuropathy at night  Refilled Lyrica -Try to go outside near sunrise -Get exercise during the day.  -Turn off all devices an hour before bedtime.  -Teas that can benefit: chamomile, valerian root, Brahmi (Bacopa) -Can consider over the counter melatonin, magnesium, and/or L-theanine. Melatonin is an anti-oxidant with multiple health benefits. Magnesium is involved in greater than 300 enzymatic reactions in the body and most of [place] are deficient as our soil is often depleted. There are 7 different types of magnesium- Bioptemizer's is a supplement with all 7 types, and each has unique benefits. Magnesium can also help with constipation and anxiety.  -Pistachios naturally increase the production of melatonin -Cozy Earth bamboo bed sheets are free from toxic chemicals.  -Tart cherry juice or a  tart cherry supplement can improve sleep and soreness post-workout   Continue tylenol PM  3. Obesity, BMI 34.18, weight 234 kbs  Gaining weight  Not interested in seeing dietitian  Educated again  4. Myalgia  Robaxin 500 TID PRN

## 2022-07-05 ENCOUNTER — Encounter: Payer: Self-pay | Admitting: Family Medicine

## 2022-07-05 ENCOUNTER — Ambulatory Visit (INDEPENDENT_AMBULATORY_CARE_PROVIDER_SITE_OTHER): Payer: Medicare Other | Admitting: Family Medicine

## 2022-07-05 VITALS — BP 124/62 | HR 63 | Resp 16 | Ht 69.5 in | Wt 235.4 lb

## 2022-07-05 DIAGNOSIS — J45991 Cough variant asthma: Secondary | ICD-10-CM | POA: Diagnosis not present

## 2022-07-05 DIAGNOSIS — J302 Other seasonal allergic rhinitis: Secondary | ICD-10-CM | POA: Diagnosis not present

## 2022-07-05 MED ORDER — PREDNISONE 20 MG PO TABS: ORAL_TABLET | ORAL | 0 refills | Status: DC

## 2022-07-05 NOTE — Progress Notes (Signed)
Subjective:    Patient ID: Robert Page, male    DOB: [birth year], 76 y.o.   MRN: [id number]  Cough  Patient reports a hacking cough for the last 3 to 4 weeks.  He reports sinus drainage.  He reports rhinorrhea.  The cough is productive of yellow and clear sputum.  He denies any chest pain.  He denies any fevers or chills.  He does report occasional headaches in his frontal sinus area however that is improved.  He has been taking Flonase and Xyzal without any relief.  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  This produces a lot of dust due to the equipment Past Medical History:  Diagnosis Date   Adenomatous polyp    Allergy    Atrial tachycardia    Diverticulosis    Dupuytren's disease    palms and soles   GERD (gastroesophageal reflux disease)    Hypertension    Past Surgical History:  Procedure Laterality Date   BACK SURGERY     12/2013   CARDIAC CATHETERIZATION  1990's   COLECTOMY  2010   for diverticulitis   COLON SURGERY N/A    [facility] 08/27/2020   COLONOSCOPY  04/03/2013   LASIK  2002   [facility]   POLYPECTOMY     Current Outpatient Medications on File Prior to Visit  Medication Sig Dispense Refill   amLODipine (NORVASC) 5 MG tablet TAKE 1 TABLET BY MOUTH EVERY DAY 90 tablet 3   aspirin EC 81 MG tablet Take 81 mg by mouth daily.     diphenhydramine-acetaminophen (TYLENOL PM) 25-500 MG TABS tablet Take 1 tablet by mouth at bedtime as needed.     DULoxetine (CYMBALTA) 60 MG capsule Take 1 capsule (60 mg total) by mouth daily. 90 capsule 3   fluticasone (FLONASE) 50 MCG/ACT nasal spray SPRAY 2 SPRAYS INTO EACH NOSTRIL EVERY DAY 48 mL 3   hydrochlorothiazide (HYDRODIURIL) 25 MG tablet TAKE 1 TABLET (25 MG TOTAL) BY MOUTH DAILY. 90 tablet 3   levocetirizine (XYZAL) 5 MG tablet Take 5 mg by mouth every evening.     omeprazole (PRILOSEC) 20 MG capsule Take 20 mg by mouth daily.     pregabalin (LYRICA) 150 MG capsule Take 1 capsule (150 mg total)  by mouth 2 (two) times daily. 60 capsule 3   rosuvastatin (CRESTOR) 10 MG tablet Take 1 tablet (10 mg total) by mouth daily. 90 tablet 3   valsartan (DIOVAN) 160 MG tablet TAKE 1 TABLET BY MOUTH EVERY DAY 30 tablet 3   No current facility-administered medications on file prior to visit.   Allergies  Allergen Reactions   Misc. Sulfonamide Containing Compounds     Other reaction(s): Not available   Ibuprofen Other (See Comments)    stomach cramps  Other reaction(s): Unknown   Social History   Socioeconomic History   Marital status: Married    Spouse name: Tailor Westfall   Number of children: 3   Years of education: Not on file   Highest education level: Not on file  Occupational History   Occupation: Retired  Tobacco Use   Smoking status: Former    Types: Cigars    Quit date: 2015    Years since quitting: 8.8   Smokeless tobacco: Never   Tobacco comments:    Pt states smokes occasional cigar.  This is typically < 1 time per month  Vaping Use   Vaping Use: Never used  Substance and Sexual  Activity   Alcohol use: No    Alcohol/week: 0.0 standard drinks of alcohol   Drug use: No   Sexual activity: Not on file  Other Topics Concern   Not on file  Social History Narrative   Pt lives in [place].     Retired from [employer] and [employer].     Goes on mission trips to [place], gets regular exercise.   Caffeine use: very rare, Diet coke   Right handed    Social Determinants of Health   Financial Resource Strain: Low Risk  (09/01/2021)   Overall Financial Resource Strain (CARDIA)    Difficulty of Paying Living Expenses: Not hard at all  Food Insecurity: No Food Insecurity (09/01/2021)   Hunger Vital Sign    Worried About Running Out of Food in the Last Year: Never true    Ran Out of Food in the Last Year: Never true  Transportation Needs: No Transportation Needs (09/01/2021)   PRAPARE - Hydrologist (Medical): No    Lack of Transportation  (Non-Medical): No  Physical Activity: Sufficiently Active (09/01/2021)   Exercise Vital Sign    Days of Exercise per Week: 5 days    Minutes of Exercise per Session: 30 min  Stress: No Stress Concern Present (09/01/2021)   [place]    Feeling of Stress : Not at all  Social Connections: [place] (09/01/2021)   Social Connection and Isolation Panel [NHANES]    Frequency of Communication with Friends and Family: More than three times a week    Frequency of Social Gatherings with Friends and Family: Three times a week    Attends Religious Services: More than 4 times per year    Active Member of Clubs or Organizations: Yes    Attends Archivist Meetings: More than 4 times per year    Marital Status: Married  Human resources officer Violence: Not At Risk (09/01/2021)   Humiliation, Afraid, Rape, and Kick questionnaire    Fear of Current or Ex-Partner: No    Emotionally Abused: No    Physically Abused: No    Sexually Abused: No   Family History  Problem Relation Age of Onset   Hypertension Mother    Macular degeneration Mother    Kidney disease Mother    Dementia Mother    Hypertension Father    Melanoma Sister    Stroke Other        uncle   Prostate cancer Other        uncle   Colon cancer Other        grandmother/uncle   Kidney disease Other        grandmother   Liver cancer Other        uncle (mets)   Heart disease Other        aunt/uncle   Lung cancer Other        uncle   Stomach cancer Cousin    Heart disease Cousin    Colon cancer Paternal Grandmother    Colon cancer Paternal Uncle    Prostate cancer Paternal Uncle    Arthritis Brother    Hypertension Brother    Esophageal cancer Neg Hx    Rectal cancer Neg Hx      Review of Systems  Respiratory:  Positive for cough.   All other systems reviewed and are negative.      Objective:   Physical Exam Vitals reviewed.  Constitutional:      General: He is not in acute distress.    Appearance: He is well-developed. He is not diaphoretic.  HENT:     Head: Normocephalic and atraumatic.     Right Ear: Tympanic membrane, ear canal and external ear normal.     Left Ear: Tympanic membrane, ear canal and external ear normal.     Nose: Congestion and rhinorrhea present.     Mouth/Throat:     Pharynx: No oropharyngeal exudate.  Eyes:     General: No scleral icterus.       Right eye: No discharge.        Left eye: No discharge.     Conjunctiva/sclera: Conjunctivae normal.     Pupils: Pupils are equal, round, and reactive to light.  Neck:     Thyroid: No thyromegaly.     Vascular: No JVD.     Trachea: No tracheal deviation.  Cardiovascular:     Rate and Rhythm: Normal rate and regular rhythm.     Heart sounds: Normal heart sounds. No murmur heard.    No friction rub. No gallop.  Pulmonary:     Effort: Pulmonary effort is normal. No respiratory distress.     Breath sounds: No stridor. Wheezing present. No rales.  Chest:     Chest wall: No tenderness.  Musculoskeletal:        General: No tenderness. Normal range of motion.     Cervical back: Normal range of motion and neck supple.  Lymphadenopathy:     Cervical: No cervical adenopathy.  Skin:    General: Skin is warm.     Coloration: Skin is not pale.     Findings: No erythema or rash.  Neurological:     Mental Status: He is alert and oriented to person, place, and time.     Cranial Nerves: No cranial nerve deficit.     Motor: No abnormal muscle tone.     Coordination: Coordination normal.     Deep Tendon Reflexes: Reflexes are normal and symmetric.  Psychiatric:        Behavior: Behavior normal.        Thought Content: Thought content normal.        Judgment: Judgment normal.           Assessment & Plan:  Cough variant asthma  Seasonal allergic rhinitis, unspecified trigger I believe the patient has allergic rhinosinusitis with a  cough variant asthma related to his allergies and his occupation.  I will give the patient a prednisone taper pack due to the severity of the coughing and wheezing and I recommended that he begin Breztri 2 puffs inhaled twice daily.  If he sees significant benefit from this, I will give him a prescription for Symbicort to use long-term.  I gave him a sample of the Breztri to try.

## 2022-07-23 ENCOUNTER — Other Ambulatory Visit: Payer: Self-pay | Admitting: Family Medicine

## 2022-07-23 DIAGNOSIS — I10 Essential (primary) hypertension: Secondary | ICD-10-CM

## 2022-08-14 ENCOUNTER — Encounter: Payer: Self-pay | Admitting: Physical Medicine and Rehabilitation

## 2022-08-14 ENCOUNTER — Encounter
Payer: Medicare Other | Attending: Physical Medicine and Rehabilitation | Admitting: Physical Medicine and Rehabilitation

## 2022-08-14 VITALS — BP 133/74 | HR 71 | Temp 98.0°F

## 2022-08-14 DIAGNOSIS — G4701 Insomnia due to medical condition: Secondary | ICD-10-CM | POA: Insufficient documentation

## 2022-08-14 DIAGNOSIS — G629 Polyneuropathy, unspecified: Secondary | ICD-10-CM | POA: Diagnosis not present

## 2022-08-14 MED ORDER — CAPSAICIN-CLEANSING GEL 8 % EX KIT: 4.0000 | PACK | Freq: Once | CUTANEOUS | Status: DC

## 2022-08-14 NOTE — Patient Instructions (Signed)
[facility] [employer]

## 2022-08-14 NOTE — Progress Notes (Signed)
Subjective:    Patient ID: Robert Page, male    DOB: 1946-02-05, 76 y.o.   MRN: [id number]  HPI Male with pmh/psh of polyneuropathy, atrial tachycardia, back surgery (?laminectomy L4-5), left foot surgery (tumor removal x4), dupuytens (palms and soles), HTN, GERD presents for follow-up of bilateral neuropathy.  Initially stated: Started summer 2000.  He had foot surgery in left foot in ~2016. Progressing.  Radiating proximally.  No symptoms in hands.  Ambulating and activity improves the pain. Shoes and inactivity exacerbate the pain. Hot and tingling/burning.  He notes I am the 6th doctor he's been to. He saw PCP and Ortho, who did MRI and told non-surgical.  He was referred to Rheum, who did not believe it was RA and thought it was L4-5 radic.  He was referred to Neurology, who ordered NCV/EMG (showing polyneuropathy, left peroneal neuropathy, Left L5/S1 radic) and MRI.  MRI C-spine showing mild disc bulges C3-6. He had MRI L-spine.  He saw Neurosurg, who did Myelogram and was told symptoms not originating in back.  Constant.  Associated numbness.  Gabapentin, Lyrica, Cymbalta did not help. He was told he has tried "every single medication for neuropathy" without benefit. He notes improvement when he sleeps in a different bed.  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 Pain does not limit activities.   Since that time, patient states he has not tried heat. He states he had burning with Capsacin for a few days but it does help for some time. He obtained Vitamin levels. Sleep has improved.  Patient states he was losing weight, but gained in on vacation. Patient states he often does not have time to take care of his body, take meds consistently, etc.   -He is taking Lyrica and Cymbalta. He has tightness and stiffness in both lower extremities. He had a dose of steroid dose pack that did provide some relief to  his back pain. If he is barefoot in the shower he feels off balance. He saw Dr. Doran Durand at [facility]. He has had prior foot surgery- was told he had arthritis. He saw rheumatology and was told he does not have rheumatoid arthritis. He had EMG/NCS (results above). -he asks for refill of Lyrica and Cymbalta   Both feet are really stiff He is ready to try Qutenza today, he received minimal benefit with first treatment Discussed trying longer duration to 35 minutes today since he did not have any burning with prior treatment and he is agreeable to this Did not require ice packs during treatment today, but toward end of treatment developed burning on dorsum of right foot Pain can be very severe at night, this is when pain continues to be worse Needs medication refills today It negatively affects his sleep He feels burning pain worst on tops of both feet but also present in soles He also has pain radiating from his back into his right shin He is ok on his medicines- no refills needed right now He is not sure if Lyrica or Cymbalta are helping. He will let us know when he needs refills Neuropathy has been stable Qutenza helps early in application.  He does not want to take things that are not needed -His wife has been diagnosed with lewy Body dementia -she was started on Trazodone.   Pain Inventory Average Pain 5 Pain Right Now 3 My pain is burning  at times, stabbing, aching  In the last 24 hours, has pain interfered with the following? General activity 1 Relation with others 2 Enjoyment of life 4 What TIME of day is your pain at its worst? Daytime and evening Sleep (in general) Good  Pain is worse with: sitting, standing, some activities Pain improves with: rest, possibly medication Relief from Meds: 1   Family History  Problem Relation Age of Onset   Hypertension Mother    Macular degeneration Mother    Kidney disease Mother    Dementia Mother    Hypertension Father     Melanoma Sister    Stroke Other        uncle   Prostate cancer Other        uncle   Colon cancer Other        grandmother/uncle   Kidney disease Other        grandmother   Liver cancer Other        uncle (mets)   Heart disease Other        aunt/uncle   Lung cancer Other        uncle   Stomach cancer Cousin    Heart disease Cousin    Colon cancer Paternal Grandmother    Colon cancer Paternal Uncle    Prostate cancer Paternal Uncle    Arthritis Brother    Hypertension Brother    Esophageal cancer Neg Hx    Rectal cancer Neg Hx    Social History   Socioeconomic History   Marital status: Married    Spouse name: Carvel Huskins   Number of children: 3   Years of education: Not on file   Highest education level: Not on file  Occupational History   Occupation: Retired  Tobacco Use   Smoking status: Former    Types: Cigars    Quit date: 2015    Years since quitting: 8.9   Smokeless tobacco: Never   Tobacco comments:    Pt states smokes occasional cigar.  This is typically < 1 time per month  Vaping Use   Vaping Use: Never used  Substance and Sexual Activity   Alcohol use: No    Alcohol/week: 0.0 standard drinks of alcohol   Drug use: No   Sexual activity: Not on file  Other Topics Concern   Not on file  Social History Narrative   Pt lives in [place].     Retired from [employer] and [employer].     Goes on mission trips to [place], gets regular exercise.   Caffeine use: very rare, Diet coke   Right handed    Social Determinants of Health   Financial Resource Strain: Low Risk  (09/01/2021)   Overall Financial Resource Strain (CARDIA)    Difficulty of Paying Living Expenses: Not hard at all  Food Insecurity: No Food Insecurity (09/01/2021)   Hunger Vital Sign    Worried About Running Out of Food in the Last Year: Never true    Ran Out of Food in the Last Year: Never true  Transportation Needs: No Transportation Needs (09/01/2021)   PRAPARE - Armed forces logistics/support/administrative officer (Medical): No    Lack of Transportation (Non-Medical): No  Physical Activity: Sufficiently Active (09/01/2021)   Exercise Vital Sign    Days of Exercise per Week: 5 days    Minutes of Exercise per Session: 30 min  Stress: No Stress Concern Present (09/01/2021)   [place]  Stress Questionnaire    Feeling of Stress : Not at all  Social Connections: Socially Integrated (09/01/2021)   Social Connection and Isolation Panel [NHANES]    Frequency of Communication with Friends and Family: More than three times a week    Frequency of Social Gatherings with Friends and Family: Three times a week    Attends Religious Services: More than 4 times per year    Active Member of Clubs or Organizations: Yes    Attends Archivist Meetings: More than 4 times per year    Marital Status: Married   Past Surgical History:  Procedure Laterality Date   BACK SURGERY     12/2013   CARDIAC CATHETERIZATION  1990's   COLECTOMY  2010   for diverticulitis   COLON SURGERY N/A    [facility] 08/27/2020   COLONOSCOPY  04/03/2013   LASIK  2002   Bil   POLYPECTOMY     Past Medical History:  Diagnosis Date   Adenomatous polyp    Allergy    Atrial tachycardia    Diverticulosis    Dupuytren's disease    palms and soles   GERD (gastroesophageal reflux disease)    Hypertension    BP 133/74   Pulse 71   Temp 98 F (36.7 C)   SpO2 97%   Opioid Risk Score:   Fall Risk Score:  `1  Depression screen [facility] 2/9     08/14/2022    1:37 PM 07/05/2022   12:19 PM 05/23/2022   11:10 AM 04/06/2022    2:56 PM 11/22/2021    1:54 PM 09/01/2021   11:58 AM 08/09/2021   11:29 AM  Depression screen PHQ 2/9  Decreased Interest 0 0 0 0 0 0 0  Down, Depressed, Hopeless 0 0 0 0 0 0 0  PHQ - 2 Score 0 0 0 0 0 0 0  Altered sleeping    0     Tired, decreased energy    2     Change in appetite    0     Feeling bad or failure about yourself     0      Trouble concentrating    0     Moving slowly or fidgety/restless    0     Suicidal thoughts    0     PHQ-9 Score    2     Difficult doing work/chores    Not difficult at all      Review of Systems  Constitutional: Negative.   HENT: Negative.    Eyes: Negative.   Respiratory: Negative.    Cardiovascular: Negative.   Gastrointestinal: Negative.   Endocrine: Negative.   Genitourinary: Negative.   Musculoskeletal:  Positive for gait problem.  Skin: Negative.   Allergic/Immunologic: Negative.   Hematological: Negative.   Psychiatric/Behavioral: Negative.        Objective:   Physical Exam  Gen: no distress, normal appearing, BMI 33.62, weight 235 lbs, BP 118 HEENT: oral mucosa pink and moist, NCAT Gen: no distress, normal appearing HEENT: oral mucosa pink and moist, NCAT Cardio: Reg rate Chest: normal effort, normal rate of breathing Abd: soft, non-distended Ext: no edema Psych: pleasant, normal affect Skin: intact Neuro: HOH Motor: B/l LE: 5/5 throughout Sensation diminished to light touch b/l LE on soles of both feet Vibration diminished in b/l LE knees and distally, intact UE (previously)    Assessment & Plan:  Male with pmh/psh ofpolyneuropathy, atrial tachycardia, back surgery (?laminectomy  L4-5), left foot surgery (tumor removal x4), dupuytens (palms and soles), HTN, GERD presents for f/u b/l feet pain.  1. Chronic bilateral feet pain -   MRI foot report unremarkable.  MRI back report showing ? L5-S1 radic  NCS/EMG - showing polyneuropathy and L5-SI radic on left, results reviewed.   No benefit with Gabapentin  Trial Heat, reminded again  Will consider PT  Discussing purchasing TENS unit from [employer]  Trial OTC Lidocaine patch  Continue Lyrica 150 TID  Will consider referral to Neurosurg, pt would like to hold off at present  Will consider ESI, pt would like to hold off at present  Will consider PNS  Retry Capsacin, educated again  Continue  Cymbalta  -Discussed Qutenza as an option for neuropathic pain control. Discussed that this is a capsaicin patch, stronger than capsaicin cream. Discussed that it is currently approved for diabetic peripheral neuropathy and post-herpetic neuralgia, but that it has also shown benefit in treating other forms of neuropathy. Provided patient with link to site to learn more about the patch: CinemaBonus.fr. Discussed that the patch would be placed in office and benefits usually last 3 months. Discussed that unintended exposure to capsaicin can cause severe irritation of eyes, mucous membranes, respiratory tract, and skin, but that Qutenza is a local treatment and does not have the systemic side effects of other nerve medications. Discussed that there may be pain, itching, erythema, and decreased sensory function associated with the application of Qutenza. Side effects usually subside within 1 week. A cold pack of analgesic medications can help with these side effects. Blood pressure can also be increased due to pain associated with administration of the patch.  4 patches of Qutenza was applied to the area of pain. Ice packs were applied during the procedure to ensure patient comfort. Blood pressure was monitored every 15 minutes. The patient tolerated the procedure well. Post-procedure instructions were given and follow-up has been scheduled.    Patient states main goal is to be pain free - improving  Educated on importance of caring for self. -Discussed current symptoms of pain and history of pain.  -Discussed benefits of exercise in reducing pain. -Discussed following foods that may reduce pain: 1) Ginger (especially studied for arthritis)- reduce leukotriene production to decrease inflammation 2) Blueberries- high in phytonutrients that decrease inflammation 3) Salmon- marine omega-3s reduce joint swelling and pain 4) Pumpkin seeds- reduce inflammation 5) dark chocolate- reduces inflammation 6)  turmeric- reduces inflammation 7) tart cherries - reduce pain and stiffness 8) extra virgin olive oil - its compound olecanthal helps to block prostaglandins  9) chili peppers- can be eaten or applied topically via capsaicin 10) mint- helpful for headache, muscle aches, joint pain, and itching 11) garlic- reduces inflammation  Link to further information on diet for chronic pain: http://www.randall.com/    2. Sleep disturbance  Discontinue amitriptyline  Discussed that Qutenza has been particularly shown to help with painful neuropathy at night  Continue Lyrica -Try to go outside near sunrise -Get exercise during the day.  -Turn off all devices an hour before bedtime.  -Teas that can benefit: chamomile, valerian root, Brahmi (Bacopa) -Can consider over the counter melatonin, magnesium, and/or L-theanine. Melatonin is an anti-oxidant with multiple health benefits. Magnesium is involved in greater than 300 enzymatic reactions in the body and most of [place] are deficient as our soil is often depleted. There are 7 different types of magnesium- Bioptemizer's is a supplement with all 7 types, and each has unique benefits. Magnesium  can also help with constipation and anxiety.  -Pistachios naturally increase the production of melatonin -[employer] Earth bamboo bed sheets are free from toxic chemicals.  -Tart cherry juice or a tart cherry supplement can improve sleep and soreness post-workout   Continue tylenol PM  3. Obesity, BMI 34.18, weight 234 kbs  Gaining weight  Not interested in seeing dietitian  Educated again  4. Myalgia  Robaxin 500 TID PRN

## 2022-08-15 ENCOUNTER — Ambulatory Visit: Payer: Medicare Other | Admitting: Physical Medicine and Rehabilitation

## 2022-08-24 ENCOUNTER — Other Ambulatory Visit: Payer: Self-pay | Admitting: Family Medicine

## 2022-08-24 NOTE — Telephone Encounter (Signed)
Requested Prescriptions  Pending Prescriptions Disp Refills   amLODipine (NORVASC) 5 MG tablet [Pharmacy Med Name: AMLODIPINE BESYLATE 5 MG TAB] 90 tablet 0    Sig: TAKE 1 TABLET BY MOUTH EVERY DAY     Cardiovascular: Calcium Channel Blockers 2 Failed - 08/24/2022  2:15 AM      Failed - Valid encounter within last 6 months    Recent Outpatient Visits           1 year ago Fever, unspecified fever cause   [place] [name], [name], MD   1 year ago Right sided sciatica   [place] Susy Frizzle, MD   1 year ago Skin tear of left hand without complication, initial encounter   [place] Eulogio Bear, NP   1 year ago Benign essential HTN   [place] [name], [name], MD   2 years ago Acute swimmer's ear of left side   [place] [name], [name], MD              Passed - Last BP in normal range    BP Readings from Last 1 Encounters:  08/14/22 133/74         Passed - Last Heart Rate in normal range    Pulse Readings from Last 1 Encounters:  08/14/22 71

## 2022-09-07 ENCOUNTER — Telehealth: Payer: Self-pay

## 2022-09-07 NOTE — Telephone Encounter (Signed)
Patient states that he has been waking up drenched in sweat approx. 3 nights a week for the last couple of months.  No other symptoms other than sometimes having to get up multiple times to urinate.  Is wondering if this could possible be from his medications or should he come in for an appointment to discuss further.

## 2022-09-07 NOTE — Telephone Encounter (Signed)
Appointment scheduled for 09/15/22 @ 12

## 2022-09-15 ENCOUNTER — Ambulatory Visit
Admission: RE | Admit: 2022-09-15 | Discharge: 2022-09-15 | Disposition: A | Discharge status: Home / Self Care | Payer: Medicare Other | Source: Ambulatory Visit | Attending: Family Medicine | Admitting: Family Medicine

## 2022-09-15 ENCOUNTER — Ambulatory Visit: Payer: Medicare Other | Admitting: Family Medicine

## 2022-09-15 ENCOUNTER — Encounter: Payer: Self-pay | Admitting: Family Medicine

## 2022-09-15 VITALS — BP 124/76 | HR 76 | Ht 69.5 in | Wt 237.4 lb

## 2022-09-15 DIAGNOSIS — R61 Generalized hyperhidrosis: Secondary | ICD-10-CM

## 2022-09-15 NOTE — Progress Notes (Signed)
Subjective:    Patient ID: Robert Page, male    DOB: 1946/04/27, 77 y.o.   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  He states that he will awaken at night sometimes soaking wet.  At times he has to sleep on a towel to keep from soaking through the bed.  He denies any fevers or chills during the day.  He does occasionally have a cough however he attributes that to cutting corn.  He states that whenever he is exposed to corn dust, he begins coughing.  I believe that he has a farmers lung.  He also reports fatigue.  He is on Cymbalta for neuropathic pain.  He takes Cymbalta at night and he has been on this medication for about a year he believes.  Reviewing his medication list, this will be the most likely contributor to night sweats.  He denies any melena or hematochezia however he is due for colonoscopy this year. Past Medical History:  Diagnosis Date   Adenomatous polyp    Allergy    Atrial tachycardia    Diverticulosis    Dupuytren's disease    palms and soles   GERD (gastroesophageal reflux disease)    Hypertension    Past Surgical History:  Procedure Laterality Date   BACK SURGERY     12/2013   CARDIAC CATHETERIZATION  1990's   COLECTOMY  2010   for diverticulitis   COLON SURGERY N/A    [facility] 08/27/2020   COLONOSCOPY  04/03/2013   LASIK  2002   [facility]   POLYPECTOMY     Current Outpatient Medications on File Prior to Visit  Medication Sig Dispense Refill   amLODipine (NORVASC) 5 MG tablet TAKE 1 TABLET BY MOUTH EVERY DAY 90 tablet 0   aspirin EC 81 MG tablet Take 81 mg by mouth daily.     diphenhydramine-acetaminophen (TYLENOL PM) 25-500 MG TABS tablet Take 1 tablet by mouth at bedtime as needed.     DULoxetine (CYMBALTA) 60 MG capsule Take 1 capsule (60 mg total) by mouth daily. 90 capsule 3   fluticasone (FLONASE) 50 MCG/ACT nasal spray SPRAY 2 SPRAYS INTO EACH NOSTRIL EVERY DAY 48 mL 3    hydrochlorothiazide (HYDRODIURIL) 25 MG tablet TAKE 1 TABLET (25 MG TOTAL) BY MOUTH DAILY. 90 tablet 3   levocetirizine (XYZAL) 5 MG tablet Take 5 mg by mouth every evening.     neomycin-polymyxin b-dexamethasone (MAXITROL) 3.5-10000-0.1 SUSP Place into the right eye.     omeprazole (PRILOSEC) 20 MG capsule Take 20 mg by mouth daily.     predniSONE (DELTASONE) 20 MG tablet 3 tabs poqday 1-2, 2 tabs poqday 3-4, 1 tab poqday 5-6 12 tablet 0   pregabalin (LYRICA) 150 MG capsule Take 1 capsule (150 mg total) by mouth 2 (two) times daily. 60 capsule 3   rosuvastatin (CRESTOR) 10 MG tablet Take 1 tablet (10 mg total) by mouth daily. 90 tablet 3   tadalafil (CIALIS) 5 MG tablet Take 5 mg by mouth daily. (Patient not taking: Reported on 08/14/2022)     valsartan (DIOVAN) 160 MG tablet TAKE 1 TABLET BY MOUTH EVERY DAY 90 tablet 1   Current Facility-Administered Medications on File Prior to Visit  Medication Dose Route Frequency Provider Last Rate Last Admin   capsaicin topical system 8 % patch 4 patch  4 patch Topical Once Raulkar, Clide Deutscher, MD       Allergies  Allergen Reactions   Misc. Sulfonamide Containing Compounds     Other reaction(s): Not available   Ibuprofen Other (See Comments)    stomach cramps  Other reaction(s): Unknown   Social History   Socioeconomic History   Marital status: Married    Spouse name: Antion Andres   Number of children: 3   Years of education: Not on file   Highest education level: Not on file  Occupational History   Occupation: Retired  Tobacco Use   Smoking status: Former    Types: Cigars    Quit date: 2015    Years since quitting: 9.0   Smokeless tobacco: Never   Tobacco comments:    Pt states smokes occasional cigar.  This is typically < 1 time per month  Vaping Use   Vaping Use: Never used  Substance and Sexual Activity   Alcohol use: No    Alcohol/week: 0.0 standard drinks of alcohol   Drug use: No   Sexual activity: Not on file  Other  Topics Concern   Not on file  Social History Narrative   Pt lives in [place].     Retired from [employer] and [employer].     Goes on mission trips to [place], gets regular exercise.   Caffeine use: very rare, Diet coke   Right handed    Social Determinants of Health   Financial Resource Strain: Low Risk  (09/01/2021)   Overall Financial Resource Strain (CARDIA)    Difficulty of Paying Living Expenses: Not hard at all  Food Insecurity: No Food Insecurity (09/01/2021)   Hunger Vital Sign    Worried About Running Out of Food in the Last Year: Never true    Ran Out of Food in the Last Year: Never true  Transportation Needs: No Transportation Needs (09/01/2021)   PRAPARE - Hydrologist (Medical): No    Lack of Transportation (Non-Medical): No  Physical Activity: Sufficiently Active (09/01/2021)   Exercise Vital Sign    Days of Exercise per Week: 5 days    Minutes of Exercise per Session: 30 min  Stress: No Stress Concern Present (09/01/2021)   [place]    Feeling of Stress : Not at all  Social Connections: [place] (09/01/2021)   Social Connection and Isolation Panel [NHANES]    Frequency of Communication with Friends and Family: More than three times a week    Frequency of Social Gatherings with Friends and Family: Three times a week    Attends Religious Services: More than 4 times per year    Active Member of Clubs or Organizations: Yes    Attends Archivist Meetings: More than 4 times per year    Marital Status: Married  Human resources officer Violence: Not At Risk (09/01/2021)   Humiliation, Afraid, Rape, and Kick questionnaire    Fear of Current or Ex-Partner: No    Emotionally Abused: No    Physically Abused: No    Sexually Abused: No   Family History  Problem Relation Age of Onset   Hypertension Mother    Macular degeneration Mother    Kidney disease Mother     Dementia Mother    Hypertension Father    Melanoma Sister    Stroke Other        uncle   Prostate cancer Other        uncle   Colon cancer Other        grandmother/uncle  Kidney disease Other        grandmother   Liver cancer Other        uncle (mets)   Heart disease Other        aunt/uncle   Lung cancer Other        uncle   Stomach cancer Cousin    Heart disease Cousin    Colon cancer Paternal Grandmother    Colon cancer Paternal Uncle    Prostate cancer Paternal Uncle    Arthritis Brother    Hypertension Brother    Esophageal cancer Neg Hx    Rectal cancer Neg Hx      Review of Systems  All other systems reviewed and are negative.      Objective:   Physical Exam Vitals reviewed.  Constitutional:      General: He is not in acute distress.    Appearance: He is well-developed. He is not diaphoretic.  HENT:     Head: Normocephalic and atraumatic.     Right Ear: Tympanic membrane, ear canal and external ear normal.     Left Ear: Tympanic membrane, ear canal and external ear normal.     Nose: Congestion and rhinorrhea present.     Mouth/Throat:     Pharynx: No oropharyngeal exudate.  Eyes:     General: No scleral icterus.       Right eye: No discharge.        Left eye: No discharge.     Conjunctiva/sclera: Conjunctivae normal.     Pupils: Pupils are equal, round, and reactive to light.  Neck:     Thyroid: No thyromegaly.     Vascular: No JVD.     Trachea: No tracheal deviation.  Cardiovascular:     Rate and Rhythm: Normal rate and regular rhythm.     Heart sounds: Normal heart sounds. No murmur heard.    No friction rub. No gallop.  Pulmonary:     Effort: Pulmonary effort is normal. No respiratory distress.     Breath sounds: No stridor. Wheezing present. No rales.  Chest:     Chest wall: No tenderness.  Musculoskeletal:        General: No tenderness. Normal range of motion.     Cervical back: Normal range of motion and neck supple.   Lymphadenopathy:     Cervical: No cervical adenopathy.  Skin:    General: Skin is warm.     Coloration: Skin is not pale.     Findings: No erythema or rash.  Neurological:     Mental Status: He is alert and oriented to person, place, and time.     Cranial Nerves: No cranial nerve deficit.     Motor: No abnormal muscle tone.     Coordination: Coordination normal.     Deep Tendon Reflexes: Reflexes are normal and symmetric.  Psychiatric:        Behavior: Behavior normal.        Thought Content: Thought content normal.        Judgment: Judgment normal.           Assessment & Plan:  Night sweats - Plan: DG Chest 2 View, Testosterone Total,Free,Bio, Males, Sedimentation rate, C-reactive protein Patient's exam today is normal.  We discussed potential causes of night sweats including malignancies, infections, medications, hormones, etc. given his recent history of coughing, I would like to obtain a chest x-ray.  I have a low index of suspicion for tuberculosis but if there is any type  of granuloma or mass seen on the chest x-ray I would obtain serologies for Mycobacterium.  I will also check a sed rate and a CRP.  If these are elevated it would lead me to want to investigate for possible lymphoma or malignancy however there are no palpable lymph nodes today on exam.  I will also check a testosterone level to look for hypogonadism.  If lab work and x-rays are normal, consider weaning down on Cymbalta to see if the night sweats could be related to this

## 2022-09-17 LAB — TESTOSTERONE TOTAL,FREE,BIO, MALES
Albumin: 4.4 g/dL (ref 3.6–5.1)
Sex Hormone Binding: 42 nmol/L (ref 22–77)
Testosterone, Bioavailable: 76.8 ng/dL (ref 15.0–150.0)
Testosterone, Free: 38.2 pg/mL (ref 6.0–73.0)
Testosterone: 360 ng/dL (ref 250–827)

## 2022-09-17 LAB — C-REACTIVE PROTEIN: CRP: 4 mg/L (ref <8.0)

## 2022-09-17 LAB — SEDIMENTATION RATE: Sed Rate: 2 mm/h (ref 0–20)

## 2022-09-20 ENCOUNTER — Other Ambulatory Visit: Payer: Self-pay

## 2022-09-20 DIAGNOSIS — R918 Other nonspecific abnormal finding of lung field: Secondary | ICD-10-CM

## 2022-09-24 ENCOUNTER — Emergency Department (HOSPITAL_COMMUNITY): Payer: Medicare Other

## 2022-09-24 ENCOUNTER — Emergency Department (HOSPITAL_COMMUNITY)
Admission: EM | Admit: 2022-09-24 | Discharge: 2022-09-24 | Disposition: A | Discharge status: Home / Self Care | Payer: Medicare Other | Attending: Emergency Medicine | Admitting: Emergency Medicine

## 2022-09-24 ENCOUNTER — Other Ambulatory Visit: Payer: Self-pay

## 2022-09-24 ENCOUNTER — Encounter (HOSPITAL_COMMUNITY): Payer: Self-pay

## 2022-09-24 DIAGNOSIS — Z79899 Other long term (current) drug therapy: Secondary | ICD-10-CM | POA: Insufficient documentation

## 2022-09-24 DIAGNOSIS — R1030 Lower abdominal pain, unspecified: Secondary | ICD-10-CM | POA: Diagnosis not present

## 2022-09-24 DIAGNOSIS — I10 Essential (primary) hypertension: Secondary | ICD-10-CM | POA: Diagnosis not present

## 2022-09-24 LAB — CBC WITH DIFFERENTIAL/PLATELET
Abs Immature Granulocytes: 0.01 10*3/uL (ref 0.00–0.07)
Basophils Absolute: 0.1 10*3/uL (ref 0.0–0.1)
Basophils Relative: 2 %
Eosinophils Absolute: 0.2 10*3/uL (ref 0.0–0.5)
Eosinophils Relative: 4 %
HCT: 46.3 % (ref 39.0–52.0)
Hemoglobin: 15.4 g/dL (ref 13.0–17.0)
Immature Granulocytes: 0 %
Lymphocytes Relative: 23 %
Lymphs Abs: 1.4 10*3/uL (ref 0.7–4.0)
MCH: 29.5 pg (ref 26.0–34.0)
MCHC: 33.3 g/dL (ref 30.0–36.0)
MCV: 88.7 fL (ref 80.0–100.0)
Monocytes Absolute: 0.6 10*3/uL (ref 0.1–1.0)
Monocytes Relative: 11 %
Neutro Abs: 3.6 10*3/uL (ref 1.7–7.7)
Neutrophils Relative %: 60 %
Platelets: 225 10*3/uL (ref 150–400)
RBC: 5.22 MIL/uL (ref 4.22–5.81)
RDW: 13 % (ref 11.5–15.5)
WBC: 5.9 10*3/uL (ref 4.0–10.5)
nRBC: 0 % (ref 0.0–0.2)

## 2022-09-24 LAB — COMPREHENSIVE METABOLIC PANEL
ALT: 21 U/L (ref 0–44)
AST: 21 U/L (ref 15–41)
Albumin: 4.1 g/dL (ref 3.5–5.0)
Alkaline Phosphatase: 49 U/L (ref 38–126)
Anion gap: 9 (ref 5–15)
BUN: 20 mg/dL (ref 8–23)
CO2: 27 mmol/L (ref 22–32)
Calcium: 8.8 mg/dL — ABNORMAL LOW (ref 8.9–10.3)
Chloride: 102 mmol/L (ref 98–111)
Creatinine, Ser: 0.95 mg/dL (ref 0.61–1.24)
GFR, Estimated: >60 mL/min (ref >60)
Glucose, Bld: 108 mg/dL — ABNORMAL HIGH (ref 70–99)
Potassium: 3.6 mmol/L (ref 3.5–5.1)
Sodium: 138 mmol/L (ref 135–145)
Total Bilirubin: 0.6 mg/dL (ref 0.3–1.2)
Total Protein: 7 g/dL (ref 6.5–8.1)

## 2022-09-24 LAB — URINALYSIS, ROUTINE W REFLEX MICROSCOPIC
Bilirubin Urine: NEGATIVE
Glucose, UA: NEGATIVE mg/dL
Hgb urine dipstick: NEGATIVE
Ketones, ur: NEGATIVE mg/dL
Leukocytes,Ua: NEGATIVE
Nitrite: NEGATIVE
Protein, ur: NEGATIVE mg/dL
Specific Gravity, Urine: 1.012 (ref 1.005–1.030)
pH: 6 (ref 5.0–8.0)

## 2022-09-24 MED ORDER — IOHEXOL 300 MG/ML  SOLN
100.0000 mL | Freq: Once | INTRAMUSCULAR | Status: AC | PRN
  Administered 2022-09-24: 100 mL via INTRAVENOUS

## 2022-09-24 NOTE — ED Provider Notes (Signed)
[facility] Provider Note   CSN: [id number] Arrival date & time: 09/24/22  1248     History  Chief Complaint  Patient presents with   Abdominal Pain    Robert Page is a 77 y.o. male with history significant for hypertension, diverticulitis, GERD presents to the ED complaining of lower abdominal pain that has been going on for the past 5 days.  Patient states that it was worse today and he is concerned that he has diverticulitis again.  He reports that the abdominal pain started on the lower left but has migrated to be on both sides.  He notes some loose stool with a foul smell.  Denies melena, hematochezia, nausea, vomiting, fever, weakness, syncope, abdominal distention, or urinary changes.       Home Medications Prior to Admission medications   Medication Sig Start Date End Date Taking? Authorizing Provider  amLODipine (NORVASC) 5 MG tablet TAKE 1 TABLET BY MOUTH EVERY DAY 08/24/22   Susy Frizzle, MD  aspirin EC 81 MG tablet Take 81 mg by mouth daily.    [provider]  diphenhydramine-acetaminophen (TYLENOL PM) 25-500 MG TABS tablet Take 1 tablet by mouth at bedtime as needed.    [provider]  DULoxetine (CYMBALTA) 60 MG capsule Take 1 capsule (60 mg total) by mouth daily. 05/23/22   Raulkar, Clide Deutscher, MD  fluticasone (FLONASE) 50 MCG/ACT nasal spray SPRAY 2 SPRAYS INTO EACH NOSTRIL EVERY DAY 04/10/22   Susy Frizzle, MD  hydrochlorothiazide (HYDRODIURIL) 25 MG tablet TAKE 1 TABLET (25 MG TOTAL) BY MOUTH DAILY. 11/17/21   Susy Frizzle, MD  levocetirizine (XYZAL) 5 MG tablet Take 5 mg by mouth every evening.    [provider]  omeprazole (PRILOSEC) 20 MG capsule Take 20 mg by mouth daily.    [provider]  pregabalin (LYRICA) 150 MG capsule Take 1 capsule (150 mg total) by mouth 2 (two) times daily. 05/23/22   Raulkar, Clide Deutscher, MD  rosuvastatin (CRESTOR) 10 MG tablet Take 1 tablet  (10 mg total) by mouth daily. 05/19/22   Jettie Booze, MD  tadalafil (CIALIS) 5 MG tablet Take 5 mg by mouth daily. 08/11/22   [provider]  valsartan (DIOVAN) 160 MG tablet TAKE 1 TABLET BY MOUTH EVERY DAY 07/24/22   Susy Frizzle, MD      Allergies    Misc. sulfonamide containing compounds and Ibuprofen    Review of Systems   Review of Systems  Constitutional:  Negative for fever.  Gastrointestinal:  Positive for abdominal pain, diarrhea and rectal pain. Negative for abdominal distention, blood in stool, constipation, nausea and vomiting.  Genitourinary:  Negative for difficulty urinating and dysuria.  Neurological:  Negative for syncope and weakness.    Physical Exam Updated Vital Signs BP (!) 143/71   Pulse 61   Temp 98.4 F (36.9 C) (Oral)   Resp 18   Ht 5' 9.5" (1.765 m)   Wt 104.3 kg   SpO2 96%   BMI 33.48 kg/m  Physical Exam Vitals and nursing note reviewed.  Constitutional:      General: He is not in acute distress.    Appearance: He is not ill-appearing.  HENT:     Mouth/Throat:     Mouth: Mucous membranes are moist.     Pharynx: Oropharynx is clear.  Cardiovascular:     Rate and Rhythm: Regular rhythm. Bradycardia present.     Pulses: Normal  pulses.     Heart sounds: Normal heart sounds.  Pulmonary:     Effort: Pulmonary effort is normal. No respiratory distress.     Breath sounds: Normal breath sounds and air entry.  Abdominal:     General: Abdomen is flat. Bowel sounds are normal. There is no distension.     Palpations: Abdomen is soft.     Tenderness: There is abdominal tenderness (mild) in the right lower quadrant, suprapubic area and left lower quadrant. There is no guarding or rebound. Negative signs include Murphy's sign and McBurney's sign.  Musculoskeletal:     Right lower leg: No edema.     Left lower leg: No edema.  Skin:    General: Skin is warm and dry.     Capillary Refill: Capillary refill takes less than 2 seconds.      Coloration: Skin is not jaundiced.  Neurological:     Mental Status: He is alert. Mental status is at baseline.  Psychiatric:        Mood and Affect: Mood normal.        Behavior: Behavior normal.     ED Results / Procedures / Treatments   Labs (all labs ordered are listed, but only abnormal results are displayed) Labs Reviewed  COMPREHENSIVE METABOLIC PANEL - Abnormal; Notable for the following components:      Result Value   Glucose, Bld 108 (*)    Calcium 8.8 (*)    All other components within normal limits  CBC WITH DIFFERENTIAL/PLATELET  URINALYSIS, ROUTINE W REFLEX MICROSCOPIC    EKG None  Radiology CT ABDOMEN PELVIS W CONTRAST  Result Date: 09/24/2022 CLINICAL DATA:  Intermittent lower abdominal pain for 5 days worse today, history diverticulitis, GERD, hypertension EXAM: CT ABDOMEN AND PELVIS WITH CONTRAST TECHNIQUE: Multidetector CT imaging of the abdomen and pelvis was performed using the standard protocol following bolus administration of intravenous contrast. RADIATION DOSE REDUCTION: This exam was performed according to the departmental dose-optimization program which includes automated exposure control, adjustment of the mA and/or kV according to patient size and/or use of iterative reconstruction technique. CONTRAST:  127m OMNIPAQUE IOHEXOL 300 MG/ML SOLN IV. No oral contrast. COMPARISON:  10/13/2009 FINDINGS: Lower chest: Lung bases clear Hepatobiliary: Calcified granulomata superiorly within liver. Dependent tiny calculi within gallbladder. No gallbladder wall thickening by CT. Liver unremarkable. No biliary dilatation. Pancreas: Normal appearance Spleen: Normal appearance.  Adjacent splenule. Adrenals/Urinary Tract: Adrenal glands, kidneys, and ureters normal appearance. Soft tissue mass at inferior urinary bladder, likely represents enlarged central lobe of prostate gland impinging on bladder base since this has been present since 2011. Bladder otherwise  unremarkable. Stomach/Bowel: Diverticulosis of distal descending and sigmoid colon without evidence of diverticulitis. Prior sigmoid resection. Normal appendix. Stomach and bowel loops otherwise normal appearance. Vascular/Lymphatic: Atherosclerotic calcifications aorta without aneurysm. Vascular structures patent. No adenopathy. Reproductive: Prominent central lobe of prostate gland extending into bladder base. Seminal vesicles unremarkable. Other: No free air or free fluid. Tiny umbilical hernia containing fat. No inflammatory process. Musculoskeletal: Unremarkable IMPRESSION: Distal colonic diverticulosis without evidence of diverticulitis. Cholelithiasis. Prominent central lobe of prostate gland extending into bladder base, unchanged. Tiny umbilical hernia containing fat. No acute intra-abdominal or intrapelvic abnormalities. Aortic Atherosclerosis (ICD10-I70.0). Electronically Signed   By: MLavonia DanaM.D.   On: 09/24/2022 16:03    Procedures Procedures    Medications Ordered in ED Medications  iohexol (OMNIPAQUE) 300 MG/ML solution 100 mL (100 mLs Intravenous Contrast Given 09/24/22 1539)    ED Course/ Medical Decision  Making/ A&P                             Medical Decision Making Amount and/or Complexity of Data Reviewed Labs: ordered. Radiology: ordered.  Risk Prescription drug management.   This patient presents to the ED with chief complaint(s) of lower abdominal pain and diarrhea with pertinent past medical history of diverticulitis, HTN, GERD .The complaint involves an extensive differential diagnosis and also carries with it a high risk of complications and morbidity.    The differential diagnosis includes diverticulitis, UTI, kidney stone, pyelonephritis, infectious colitis, IBD, mesenteric ischemia, appendicitis, irritable bowel syndrome, bowel obstruction, colon cancer  The initial plan is to obtain baseline labs including CBC and CMP as well as UA  Additional history  obtained: Additional history obtained from  none Records reviewed Primary Care Documents  Initial Assessment:   On exam, patient appears to be resting comfortably in bed and is not in any acute distress.  Heart rate is bradycardic around 52 but with regular rhythm.  Lungs are clear to auscultation bilaterally.  Skin is warm and dry and without evidence of jaundice or rashes.  Abdomen is soft with mild tenderness to the lower quadrants and suprapubic region.  There is no abdominal distention.  Bowel sounds are normal in all quadrants.  Independent ECG/labs interpretation:  The following labs were independently interpreted:  CBC without evidence of leukocytosis or anemia. Metabolic panel without significant electrolyte disturbance, there is mild hypocalcemia and mild hyperglycemia.  Kidney function is within normal range.  LFTs are within normal range. UA without evidence of infection, proteinuria, ketonuria.  Independent visualization and interpretation of imaging: I independently visualized the following imaging with scope of interpretation limited to determining acute life threatening conditions related to emergency care: CT abdomen pelvis, which revealed no evidence of acute intra-abdominal pathology to explain patient's symptoms.  He does not have evidence of diverticulitis.  I agree with radiologist interpretation.  Treatment and Reassessment: Patient is having mild abdominal pain symptoms but has an otherwise normal workup and negative CT.  I do not feel that patient requires treatment or management in the emergency department at this time.  Patient is followed by GI and has good outpatient follow-up; he may require an outpatient colonoscopy, which he is due for this year.  Discussed results of workup with patient and advised him to continue to monitor his symptoms, should they not improve or if they worsen, have him return to ED for further workup.  Patient expresses verbal understanding and  is in agreement with plan.  Disposition:   The patient has been appropriately medically screened and/or stabilized in the ED. I have low suspicion for any other emergent medical condition which would require further screening, evaluation or treatment in the ED or require inpatient management. At time of discharge the patient is hemodynamically stable and in no acute distress. I have discussed work-up results and diagnosis with patient and answered all questions. Patient is agreeable with discharge plan. We discussed strict return precautions for returning to the emergency department and they verbalized understanding.            Final Clinical Impression(s) / ED Diagnoses Final diagnoses:  Lower abdominal pain    Rx / DC Orders ED Discharge Orders     None         Pat Kocher, [place] 09/24/22 1632    Milton Ferguson, MD 09/26/22 [phone]

## 2022-09-24 NOTE — Discharge Instructions (Addendum)
Thank you for allowing me to be a part of your care today.  Your workup was overall reassuring and did not show any evidence of diverticulitis or other emergent explanation for your symptoms.  I recommend calling [facility] tomorrow to schedule an appointment with their office.  Return to the ER if you experience worsening of your symptoms or if you have any new concerns.

## 2022-09-24 NOTE — ED Triage Notes (Signed)
Pt reports intermittent lower abd pain x 5 days, worse today, hx of diverticulitis.

## 2022-09-28 ENCOUNTER — Ambulatory Visit
Admission: RE | Admit: 2022-09-28 | Discharge: 2022-09-28 | Disposition: A | Discharge status: Home / Self Care | Payer: Medicare Other | Source: Ambulatory Visit | Attending: Family Medicine | Admitting: Family Medicine

## 2022-09-28 DIAGNOSIS — R918 Other nonspecific abnormal finding of lung field: Secondary | ICD-10-CM

## 2022-09-28 MED ORDER — IOPAMIDOL (ISOVUE-300) INJECTION 61%
75.0000 mL | Freq: Once | INTRAVENOUS | Status: AC | PRN
  Administered 2022-09-28: 75 mL via INTRAVENOUS

## 2022-10-03 ENCOUNTER — Other Ambulatory Visit: Payer: Self-pay | Admitting: Family Medicine

## 2022-10-03 DIAGNOSIS — R911 Solitary pulmonary nodule: Secondary | ICD-10-CM

## 2022-10-28 ENCOUNTER — Other Ambulatory Visit: Payer: Self-pay | Admitting: Family Medicine

## 2022-10-28 DIAGNOSIS — I10 Essential (primary) hypertension: Secondary | ICD-10-CM

## 2022-11-07 NOTE — Progress Notes (Signed)
Subjective:    Patient ID: Robert Page, male    DOB: 1945-10-10, 77 y.o.   MRN: [id number]  HPI Male with pmh/psh of polyneuropathy, atrial tachycardia, back surgery (?laminectomy L4-5), left foot surgery (tumor removal x4), dupuytens (palms and soles), HTN, GERD presents for follow-up of bilateral neuropathy.  Initially stated: Started summer 2000.  He had foot surgery in left foot in ~2016. Progressing.  Radiating proximally.  No symptoms in hands.  Ambulating and activity improves the pain. Shoes and inactivity exacerbate the pain. Hot and tingling/burning.  He notes I am the 6th doctor he's been to. He saw PCP and Ortho, who did MRI and told non-surgical.  He was referred to Rheum, who did not believe it was RA and thought it was L4-5 radic.  He was referred to Neurology, who ordered NCV/EMG (showing polyneuropathy, left peroneal neuropathy, Left L5/S1 radic) and MRI.  MRI C-spine showing mild disc bulges C3-6. He had MRI L-spine.  He saw Neurosurg, who did Myelogram and was told symptoms not originating in back.  Constant.  Associated numbness.  Gabapentin, Lyrica, Cymbalta did not help. He was told he has tried "every single medication for neuropathy" without benefit. He notes improvement when he sleeps in a different bed.  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 Pain does not limit activities.   Since that time, patient states he has not tried heat. He states he had burning with Capsacin for a few days but it does help for some time. He obtained Vitamin levels. Sleep has improved.  Patient states he was losing weight, but gained in on vacation. Patient states he often does not have time to take care of his body, take meds consistently, etc.   -He is taking Lyrica and Cymbalta. He has tightness and stiffness in both lower extremities. He had a dose of steroid dose pack that did provide some relief to  his back pain. If he is barefoot in the shower he feels off balance. He saw Dr. Doran Durand at [facility]. He has had prior foot surgery- was told he had arthritis. He saw rheumatology and was told he does not have rheumatoid arthritis. He had EMG/NCS (results above). -he asks for refill of Lyrica and Cymbalta   Both feet are really stiff -he did not get enough benefit enough from Qutenza.  Discussed trying longer duration to 35 minutes today since he did not have any burning with prior treatment and he is agreeable to this Did not require ice packs during treatment today, but toward end of treatment developed burning on dorsum of right foot Pain can be very severe at night, this is when pain continues to be worse Needs medication refills today It negatively affects his sleep He feels burning pain worst on tops of both feet but also present in soles He also has pain radiating from his back into his right shin He is ok on his medicines- no refills needed right now He is not sure if Lyrica or Cymbalta are helping. He will let us know when he needs refills Neuropathy has been stable, he feels stiff and numbness in his feet He does not want to take things that are not needed -His wife has been diagnosed with lewy Body dementia -she was started on Trazodone.  -he does not use a heating pad -he has never tried metformin  Pain Inventory Average  Pain 6 Pain Right Now 8 My pain is burning at times, stabbing, aching  In the last 24 hours, has pain interfered with the following? General activity 1 Relation with others 2 Enjoyment of life 4 What TIME of day is your pain at its worst? Daytime and evening Sleep (in general) Fair  Pain is worse with: sitting, standing, some activities Pain improves with: rest, possibly medication Relief from Meds: 1   Family History  Problem Relation Age of Onset   Hypertension Mother    Macular degeneration Mother    Kidney disease Mother    Dementia  Mother    Hypertension Father    Melanoma Sister    Stroke Other        uncle   Prostate cancer Other        uncle   Colon cancer Other        grandmother/uncle   Kidney disease Other        grandmother   Liver cancer Other        uncle (mets)   Heart disease Other        aunt/uncle   Lung cancer Other        uncle   Stomach cancer Cousin    Heart disease Cousin    Colon cancer Paternal Grandmother    Colon cancer Paternal Uncle    Prostate cancer Paternal Uncle    Arthritis Brother    Hypertension Brother    Esophageal cancer Neg Hx    Rectal cancer Neg Hx    Social History   Socioeconomic History   Marital status: Married    Spouse name: Halo Wiltfong   Number of children: 3   Years of education: Not on file   Highest education level: Not on file  Occupational History   Occupation: Retired  Tobacco Use   Smoking status: Former    Types: Cigars    Quit date: 2015    Years since quitting: 9.1   Smokeless tobacco: Never   Tobacco comments:    Pt states smokes occasional cigar.  This is typically < 1 time per month  Vaping Use   Vaping Use: Never used  Substance and Sexual Activity   Alcohol use: No    Alcohol/week: 0.0 standard drinks of alcohol   Drug use: No   Sexual activity: Not on file  Other Topics Concern   Not on file  Social History Narrative   Pt lives in [place].     Retired from [employer] and [employer].     Goes on mission trips to [place], gets regular exercise.   Caffeine use: very rare, Diet coke   Right handed    Social Determinants of Health   Financial Resource Strain: Low Risk  (09/01/2021)   Overall Financial Resource Strain (CARDIA)    Difficulty of Paying Living Expenses: Not hard at all  Food Insecurity: No Food Insecurity (09/01/2021)   Hunger Vital Sign    Worried About Running Out of Food in the Last Year: Never true    Ran Out of Food in the Last Year: Never true  Transportation Needs: No Transportation Needs  (09/01/2021)   PRAPARE - Hydrologist (Medical): No    Lack of Transportation (Non-Medical): No  Physical Activity: Sufficiently Active (09/01/2021)   Exercise Vital Sign    Days of Exercise per Week: 5 days    Minutes of Exercise per Session: 30 min  Stress: No Stress Concern Present (  09/01/2021)   [place]    Feeling of Stress : Not at all  Social Connections: [place] (09/01/2021)   Social Connection and Isolation Panel [NHANES]    Frequency of Communication with Friends and Family: More than three times a week    Frequency of Social Gatherings with Friends and Family: Three times a week    Attends Religious Services: More than 4 times per year    Active Member of Clubs or Organizations: Yes    Attends Archivist Meetings: More than 4 times per year    Marital Status: Married   Past Surgical History:  Procedure Laterality Date   BACK SURGERY     12/2013   CARDIAC CATHETERIZATION  1990's   COLECTOMY  2010   for diverticulitis   COLON SURGERY N/A    [employer]reesia 08/27/2020   COLONOSCOPY  04/03/2013   LASIK  2002   Bil   POLYPECTOMY     Past Medical History:  Diagnosis Date   Adenomatous polyp    Allergy    Atrial tachycardia    Diverticulosis    Dupuytren's disease    palms and soles   GERD (gastroesophageal reflux disease)    Hypertension    There were no vitals taken for this visit.  Opioid Risk Score:   Fall Risk Score:  `1  Depression screen [facility] 2/9     09/15/2022   11:59 AM 08/14/2022    1:37 PM 07/05/2022   12:19 PM 05/23/2022   11:10 AM 04/06/2022    2:56 PM 11/22/2021    1:54 PM 09/01/2021   11:58 AM  Depression screen [facility] 2/9  Decreased Interest 0 0 0 0 0 0 0  Down, Depressed, Hopeless 0 0 0 0 0 0 0  PHQ - 2 Score 0 0 0 0 0 0 0  Altered sleeping     0    Tired, decreased energy     2    Change in appetite     0    Feeling bad or  failure about yourself      0    Trouble concentrating     0    Moving slowly or fidgety/restless     0    Suicidal thoughts     0    PHQ-9 Score     2    Difficult doing work/chores     Not difficult at all     Review of Systems  Constitutional: Negative.   HENT: Negative.    Eyes: Negative.   Respiratory: Negative.    Cardiovascular: Negative.   Gastrointestinal: Negative.   Endocrine: Negative.   Genitourinary: Negative.   Musculoskeletal:  Positive for gait problem.       Rt upper leg  Skin: Negative.   Allergic/Immunologic: Negative.   Hematological: Negative.   Psychiatric/Behavioral: Negative.        Objective:   Physical Exam  Gen: no distress, normal appearing, BMI 34.5, weight 237 lbs, BP 137/81 HEENT: oral mucosa pink and moist, NCAT Gen: no distress, normal appearing HEENT: oral mucosa pink and moist, NCAT Cardio: Reg rate Chest: normal effort, normal rate of breathing Abd: soft, non-distended Ext: no edema Psych: pleasant, normal affect Skin: intact Neuro: HOH Motor: B/l LE: 5/5 throughout Sensation diminished to light touch b/l LE on soles of both feet Vibration diminished in b/l LE knees and distally, intact UE (previously). Normal gait    Assessment &  Plan:  Male with pmh/psh ofpolyneuropathy, atrial tachycardia, back surgery (?laminectomy L4-5), left foot surgery (tumor removal x4), dupuytens (palms and soles), HTN, GERD presents for f/u b/l feet pain.  1. Chronic bilateral feet pain -   MRI foot report unremarkable.  MRI back report showing ? L5-S1 radic  Recommended whole food rich diet.   NCS/EMG - showing polyneuropathy and L5-SI radic on left, results reviewed.   No benefit with Gabapentin  Trial Heat, reminded again  Will consider PT  Discussing purchasing TENS unit from [employer]  Trial OTC Lidocaine patch  Refilled Lyrica 150 TID  Will consider referral to Neurosurg, pt would like to hold off at present  Will consider ESI, pt would like to  hold off at present  Will consider PNS  Retry Capsacin, educated again  Continue Cymbalta  -will not repeat Qutenza given lack of benefit and cost  Prescribed metformin '500mg'$  daily prn  Patient states main goal is to be pain free - improving  Educated on importance of caring for self. -Discussed current symptoms of pain and history of pain.  -Discussed benefits of exercise in reducing pain. -Discussed following foods that may reduce pain: 1) Ginger (especially studied for arthritis)- reduce leukotriene production to decrease inflammation 2) Blueberries- high in phytonutrients that decrease inflammation 3) Salmon- marine omega-3s reduce joint swelling and pain 4) Pumpkin seeds- reduce inflammation 5) dark chocolate- reduces inflammation 6) turmeric- reduces inflammation 7) tart cherries - reduce pain and stiffness 8) extra virgin olive oil - its compound olecanthal helps to block prostaglandins  9) chili peppers- can be eaten or applied topically via capsaicin 10) mint- helpful for headache, muscle aches, joint pain, and itching 11) garlic- reduces inflammation  Link to further information on diet for chronic pain: http://www.randall.com/    2. Sleep disturbance  Discontinue amitriptyline  Discussed that Qutenza has been particularly shown to help with painful neuropathy at night  Continue Lyrica -Try to go outside near sunrise -Get exercise during the day.  -Turn off all devices an hour before bedtime.  -Teas that can benefit: chamomile, valerian root, Brahmi (Bacopa) -Can consider over the counter melatonin, magnesium, and/or L-theanine. Melatonin is an anti-oxidant with multiple health benefits. Magnesium is involved in greater than 300 enzymatic reactions in the body and most of [place] are deficient as our soil is often depleted. There are 7 different types of magnesium- Bioptemizer's is a supplement with all 7 types,  and each has unique benefits. Magnesium can also help with constipation and anxiety.  -Pistachios naturally increase the production of melatonin -Cozy Earth bamboo bed sheets are free from toxic chemicals.  -Tart cherry juice or a tart cherry supplement can improve sleep and soreness post-workout   Continue tylenol PM  3. Obesity, BMI 34.18, weight 237 kbs  Gaining weight  Not interested in seeing dietitian  Educated again  4. Myalgia  Robaxin 500 TID PRN  Lipid panel reviewed and normal  5. Prediabetes: -HgbA1c reviewed and is 5.9 -metformin '500mg'$  daily prn ordered.   6) HTN: -check BMP and magnesium level -discussed trying to minimize stress from his wife's diagnosis -discussed that he has a lot of support from his two loving daughters and son

## 2022-11-13 ENCOUNTER — Encounter
Payer: Medicare Other | Attending: Physical Medicine and Rehabilitation | Admitting: Physical Medicine and Rehabilitation

## 2022-11-13 VITALS — BP 137/81 | HR 71 | Ht 69.5 in | Wt 237.0 lb

## 2022-11-13 DIAGNOSIS — I10 Essential (primary) hypertension: Secondary | ICD-10-CM

## 2022-11-13 DIAGNOSIS — G629 Polyneuropathy, unspecified: Secondary | ICD-10-CM | POA: Diagnosis not present

## 2022-11-13 DIAGNOSIS — R7303 Prediabetes: Secondary | ICD-10-CM | POA: Diagnosis not present

## 2022-11-13 DIAGNOSIS — M791 Myalgia, unspecified site: Secondary | ICD-10-CM | POA: Diagnosis not present

## 2022-11-13 MED ORDER — METFORMIN HCL 500 MG PO TABS: 500.0000 mg | ORAL_TABLET | Freq: Every day | ORAL | 3 refills | Status: DC

## 2022-11-13 MED ORDER — PREGABALIN 150 MG PO CAPS: 150.0000 mg | ORAL_CAPSULE | Freq: Two times a day (BID) | ORAL | 3 refills | Status: DC

## 2022-11-13 NOTE — Patient Instructions (Signed)
Electric blanket, red light therapy

## 2022-11-14 LAB — BASIC METABOLIC PANEL
BUN/Creatinine Ratio: 14 (ref 10–24)
BUN: 14 mg/dL (ref 8–27)
CO2: 23 mmol/L (ref 20–29)
Calcium: 9.9 mg/dL (ref 8.6–10.2)
Chloride: 102 mmol/L (ref 96–106)
Creatinine, Ser: 1.02 mg/dL (ref 0.76–1.27)
Glucose: 113 mg/dL — ABNORMAL HIGH (ref 70–99)
Potassium: 4.5 mmol/L (ref 3.5–5.2)
Sodium: 141 mmol/L (ref 134–144)
eGFR: 77 mL/min/{1.73_m2} (ref >59)

## 2022-11-14 LAB — MAGNESIUM: Magnesium: 2.3 mg/dL (ref 1.6–2.3)

## 2022-11-22 ENCOUNTER — Telehealth: Payer: Self-pay

## 2022-11-22 DIAGNOSIS — G629 Polyneuropathy, unspecified: Secondary | ICD-10-CM

## 2022-11-22 MED ORDER — QUTENZA (4 PATCH) 8 % EX KIT: 4.0000 | PACK | Freq: Once | CUTANEOUS | 0 refills | Status: AC

## 2022-11-23 NOTE — Telephone Encounter (Signed)
PA was denied under this patients pharmacy benefits, an appeal can be done for him to try and get it covered under his pharmacy benefits, they denied saying the diagnosis code was not covered but I thought polyneuropathy was a covered diagnosis, I will leave the denial letter on your desk an you can decide if you want to do an appeal letter for him I can fax it to appeals for you

## 2022-11-30 ENCOUNTER — Telehealth: Payer: Self-pay | Admitting: Physical Medicine and Rehabilitation

## 2022-11-30 NOTE — Telephone Encounter (Signed)
Patient would like a call back in reference to insurance denial of Qutenza patches.  It looks like the letter was pur in Dr. Aline August office and the patient is wanting to know if she has spoken with insurance company.

## 2022-12-06 ENCOUNTER — Other Ambulatory Visit: Payer: Self-pay | Admitting: Family Medicine

## 2022-12-08 ENCOUNTER — Ambulatory Visit: Payer: Medicare Other | Admitting: Family Medicine

## 2022-12-08 ENCOUNTER — Telehealth: Payer: Self-pay

## 2022-12-08 ENCOUNTER — Encounter: Payer: Self-pay | Admitting: Family Medicine

## 2022-12-08 ENCOUNTER — Other Ambulatory Visit: Payer: Medicare Other

## 2022-12-08 VITALS — BP 132/78 | HR 63 | Temp 97.4°F | Ht 69.5 in | Wt 241.4 lb

## 2022-12-08 DIAGNOSIS — R300 Dysuria: Secondary | ICD-10-CM

## 2022-12-08 LAB — URINALYSIS, ROUTINE W REFLEX MICROSCOPIC
Bilirubin Urine: NEGATIVE
Glucose, UA: NEGATIVE
Hgb urine dipstick: NEGATIVE
Ketones, ur: NEGATIVE
Leukocytes,Ua: NEGATIVE
Nitrite: NEGATIVE
Protein, ur: NEGATIVE
Specific Gravity, Urine: 1.025 (ref 1.001–1.035)
pH: 5.5 (ref 5.0–8.0)

## 2022-12-08 MED ORDER — MELOXICAM 15 MG PO TABS: 15.0000 mg | ORAL_TABLET | Freq: Every day | ORAL | 0 refills | Status: DC

## 2022-12-08 MED ORDER — CIPROFLOXACIN HCL 500 MG PO TABS: 500.0000 mg | ORAL_TABLET | Freq: Two times a day (BID) | ORAL | 0 refills | Status: AC

## 2022-12-08 NOTE — Telephone Encounter (Signed)
Pt called in stating that he has a burning sensation when he urinates. Pt is coming in to provide a urine sample to possibly get a med sent to pharmacy for this. Please advise.  Cb#:: [phone]

## 2022-12-08 NOTE — Progress Notes (Signed)
Subjective:    Patient ID: Robert Page, male    DOB: October 24, 1945, 77 y.o.   MRN: [id number] Patient presents today with lower pelvic pain as well as dysuria for 2 days.  He denies any fever or chills.  He denies any nausea or vomiting.  He states that last night his urine had an extremely foul odor.  This morning he reported burning pain at the tip of his penis while peeing.  He denies any hematuria.  He denies any frequency or urgency or low back pain. Past Medical History:  Diagnosis Date   Adenomatous polyp    Allergy    Atrial tachycardia    Diverticulosis    Dupuytren's disease    palms and soles   GERD (gastroesophageal reflux disease)    Hypertension    Past Surgical History:  Procedure Laterality Date   BACK SURGERY     12/2013   CARDIAC CATHETERIZATION  1990's   COLECTOMY  2010   for diverticulitis   COLON SURGERY N/A    [employer]rees[employer] 08/27/2020   COLONOSCOPY  04/03/2013   LASIK  2002   Bil   POLYPECTOMY     Current Outpatient Medications on File Prior to Visit  Medication Sig Dispense Refill   amLODipine (NORVASC) 5 MG tablet TAKE 1 TABLET BY MOUTH EVERY DAY 90 tablet 0   aspirin EC 81 MG tablet Take 81 mg by mouth daily.     [START ON 12/14/2022] capsaicin topical system (QUTENZA, 4 PATCH,) 8 % Apply 4 patches topically once for 1 dose. Place on affected area of skin every 3 months as needed for polyneuropathy 4 patch 0   DULoxetine (CYMBALTA) 60 MG capsule Take 1 capsule (60 mg total) by mouth daily. 90 capsule 3   fluticasone (FLONASE) 50 MCG/ACT nasal spray SPRAY 2 SPRAYS INTO EACH NOSTRIL EVERY DAY 48 mL 3   hydrochlorothiazide (HYDRODIURIL) 25 MG tablet TAKE 1 TABLET (25 MG TOTAL) BY MOUTH DAILY. 90 tablet 3   levocetirizine (XYZAL) 5 MG tablet Take 5 mg by mouth every evening.     metFORMIN (GLUCOPHAGE) 500 MG tablet Take 1 tablet (500 mg total) by mouth daily with breakfast. 90 tablet 3   omeprazole (PRILOSEC) 20 MG capsule Take 20 mg by mouth daily.      pregabalin (LYRICA) 150 MG capsule Take 1 capsule (150 mg total) by mouth 2 (two) times daily. 60 capsule 3   rosuvastatin (CRESTOR) 10 MG tablet Take 1 tablet (10 mg total) by mouth daily. 90 tablet 3   tadalafil (CIALIS) 5 MG tablet Take 5 mg by mouth daily.     valsartan (DIOVAN) 160 MG tablet TAKE 1 TABLET BY MOUTH EVERY DAY 90 tablet 1   Current Facility-Administered Medications on File Prior to Visit  Medication Dose Route Frequency Provider Last Rate Last Admin   capsaicin topical system 8 % patch 4 patch  4 patch Topical Once Raulkar, Drema PryKrutika P, MD       Allergies  Allergen Reactions   Misc. Sulfonamide Containing Compounds     Other reaction(s): Not available   Ibuprofen Other (See Comments)    stomach cramps  Other reaction(s): Unknown   Social History   Socioeconomic History   Marital status: Married    Spouse name: Peterson Lombardatricia Coin   Number of children: 3   Years of education: Not on file   Highest education level: Not on file  Occupational History   Occupation: Retired  Tobacco Use   Smoking  status: Former    Types: Cigars    Quit date: 2015    Years since quitting: 9.2   Smokeless tobacco: Never   Tobacco comments:    Pt states smokes occasional cigar.  This is typically < 1 time per month  Vaping Use   Vaping Use: Never used  Substance and Sexual Activity   Alcohol use: No    Alcohol/week: 0.0 standard drinks of alcohol   Drug use: No   Sexual activity: Not on file  Other Topics Concern   Not on file  Social History Narrative   Pt lives in [place].     Retired from [employer] and [employer].     Goes on mission trips to [place], gets regular exercise.   Caffeine use: very rare, Diet coke   Right handed    Social Determinants of Health   Financial Resource Strain: Low Risk  (09/01/2021)   Overall Financial Resource Strain (CARDIA)    Difficulty of Paying Living Expenses: Not hard at all  Food Insecurity: No Food Insecurity (09/01/2021)   Hunger  Vital Sign    Worried About Running Out of Food in the Last Year: Never true    Ran Out of Food in the Last Year: Never true  Transportation Needs: No Transportation Needs (09/01/2021)   PRAPARE - Administrator, Civil Service (Medical): No    Lack of Transportation (Non-Medical): No  Physical Activity: Sufficiently Active (09/01/2021)   Exercise Vital Sign    Days of Exercise per Week: 5 days    Minutes of Exercise per Session: 30 min  Stress: No Stress Concern Present (09/01/2021)   [employer] of Occupational Health - Occupational Stress Questionnaire    Feeling of Stress : Not at all  Social Connections: Socially Integrated (09/01/2021)   Social Connection and Isolation Panel [NHANES]    Frequency of Communication with Friends and Family: More than three times a week    Frequency of Social Gatherings with Friends and Family: Three times a week    Attends Religious Services: More than 4 times per year    Active Member of Clubs or Organizations: Yes    Attends Banker Meetings: More than 4 times per year    Marital Status: Married  Catering manager Violence: Not At Risk (09/01/2021)   Humiliation, Afraid, Rape, and Kick questionnaire    Fear of Current or Ex-Partner: No    Emotionally Abused: No    Physically Abused: No    Sexually Abused: No   Family History  Problem Relation Age of Onset   Hypertension Mother    Macular degeneration Mother    Kidney disease Mother    Dementia Mother    Hypertension Father    Melanoma Sister    Stroke Other        uncle   Prostate cancer Other        uncle   Colon cancer Other        grandmother/uncle   Kidney disease Other        grandmother   Liver cancer Other        uncle (mets)   Heart disease Other        aunt/uncle   Lung cancer Other        uncle   Stomach cancer Cousin    Heart disease Cousin    Colon cancer Paternal Grandmother    Colon cancer Paternal Uncle    Prostate cancer Paternal  Uncle  Arthritis Brother    Hypertension Brother    Esophageal cancer Neg Hx    Rectal cancer Neg Hx      Review of Systems  All other systems reviewed and are negative.      Objective:   Physical Exam Vitals reviewed.  Constitutional:      General: He is not in acute distress.    Appearance: He is well-developed. He is not diaphoretic.  HENT:     Head: Normocephalic and atraumatic.     Mouth/Throat:     Pharynx: No oropharyngeal exudate.  Eyes:     General: No scleral icterus.       Right eye: No discharge.        Left eye: No discharge.     Conjunctiva/sclera: Conjunctivae normal.     Pupils: Pupils are equal, round, and reactive to light.  Neck:     Thyroid: No thyromegaly.     Vascular: No JVD.     Trachea: No tracheal deviation.  Cardiovascular:     Rate and Rhythm: Normal rate and regular rhythm.     Heart sounds: Normal heart sounds. No murmur heard.    No friction rub. No gallop.  Pulmonary:     Effort: Pulmonary effort is normal. No respiratory distress.     Breath sounds: No stridor. No wheezing or rales.  Chest:     Chest wall: No tenderness.  Musculoskeletal:        General: No tenderness. Normal range of motion.  Lymphadenopathy:     Cervical: No cervical adenopathy.  Skin:    General: Skin is warm.     Coloration: Skin is not pale.     Findings: No erythema or rash.  Neurological:     Mental Status: He is alert and oriented to person, place, and time.     Cranial Nerves: No cranial nerve deficit.     Motor: No abnormal muscle tone.     Coordination: Coordination normal.     Deep Tendon Reflexes: Reflexes are normal and symmetric.  Psychiatric:        Behavior: Behavior normal.        Thought Content: Thought content normal.        Judgment: Judgment normal.          Assessment & Plan:  Dysuria - Plan: Urinalysis, Routine w reflex microscopic Check urinalysis today.  Urinalysis is unremarkable.  Specifically there is no blood,  leukocyte esterase, or nitrates.  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  Recommend pushing fluid for the next 48 hours and taking meloxicam for inflammation.  If the pain does not improve, he can start Cipro twice daily for 7 days for possible mild prostatitis however I strongly encouraged him to try the NSAIDs and the fluid first

## 2023-01-07 ENCOUNTER — Other Ambulatory Visit: Payer: Self-pay | Admitting: Family Medicine

## 2023-01-08 NOTE — Telephone Encounter (Signed)
Requested Prescriptions  Pending Prescriptions Disp Refills   meloxicam (MOBIC) 15 MG tablet [Pharmacy Med Name: MELOXICAM 15 MG TABLET] 90 tablet 0    Sig: TAKE 1 TABLET (15 MG TOTAL) BY MOUTH DAILY.     Analgesics:  COX2 Inhibitors Failed - 01/07/2023  1:11 AM      Failed - Manual Review: Labs are only required if the patient has taken medication for more than 8 weeks.      Failed - Valid encounter within last 12 months    Recent Outpatient Visits           1 year ago Fever, unspecified fever cause   [facility] [name], Priscille Heidelberg, MD   1 year ago Right sided sciatica   [facility] Donita Brooks, MD   2 years ago Skin tear of left hand without complication, initial encounter   [facility] Valentino Nose, NP   2 years ago Benign essential HTN   [facility] Tanya Nones, Priscille Heidelberg, MD   2 years ago Acute swimmer's ear of left side   [facility] [name], Priscille Heidelberg, MD              Passed - HGB in normal range and within 360 days    Hemoglobin  Date Value Ref Range Status  09/24/2022 15.4 13.0 - 17.0 g/dL Final         Passed - Cr in normal range and within 360 days    Creat  Date Value Ref Range Status  03/30/2022 1.06 0.70 - 1.28 mg/dL Final   Creatinine, Ser  Date Value Ref Range Status  11/13/2022 1.02 0.76 - 1.27 mg/dL Final         Passed - HCT in normal range and within 360 days    HCT  Date Value Ref Range Status  09/24/2022 46.3 39.0 - 52.0 % Final         Passed - AST in normal range and within 360 days    AST  Date Value Ref Range Status  09/24/2022 21 15 - 41 U/L Final         Passed - ALT in normal range and within 360 days    ALT  Date Value Ref Range Status  09/24/2022 21 0 - 44 U/L Final         Passed - eGFR is 30 or above and within 360 days    GFR, Est African American  Date Value Ref Range Status  02/24/2021 79 > OR = 60 mL/min/1.13m2 Final   GFR,  Est Non African American  Date Value Ref Range Status  02/24/2021 68 > OR = 60 mL/min/1.35m2 Final   GFR, Estimated  Date Value Ref Range Status  09/24/2022 >60 >60 mL/min Final    Comment:    (NOTE) Calculated using the CKD-EPI Creatinine Equation (2021)    GFR  Date Value Ref Range Status  12/19/2010 74.13 >60.00 mL/min Final   eGFR  Date Value Ref Range Status  11/13/2022 77 >59 mL/min/1.73 Final         Passed - Patient is not pregnant

## 2023-01-09 ENCOUNTER — Encounter: Payer: Self-pay | Admitting: Family Medicine

## 2023-01-09 ENCOUNTER — Ambulatory Visit: Payer: Medicare Other | Admitting: Family Medicine

## 2023-01-09 VITALS — BP 126/64 | HR 79 | Temp 98.5°F | Ht 69.5 in | Wt 239.0 lb

## 2023-01-09 DIAGNOSIS — R270 Ataxia, unspecified: Secondary | ICD-10-CM

## 2023-01-09 NOTE — Progress Notes (Signed)
Subjective:    Patient ID: Robert Page, male    DOB: [birth year], 77 y.o.   MRN: [id number]  Dizziness  Patient states that yesterday he was at school.  He was staggering as he was walking through the halls.  He felt like he was walking side-to-side.  Despite trying to walk straight, he could not keep his balance.  He was able to make at home.  He was working in his barn.  When he stood up, he lost his balance and was unable to catch himself and fell backwards striking his left shoulder.  He was extremely dizzy yesterday.  He denies any vertigo.  He states that the room was never spinning.  He did not feel like he was on the boat or have any disequilibrium.  Instead he felt like he could not walk straight.  He was demonstrating ataxia.  He denied any slurred speech, facial droop, arm or leg weakness.  This morning he is feeling better.  I performed a Dix-Hallpike maneuver which was negative bilaterally.  His neurologic exam today is normal.  Cranial nerves II through XII are grossly intact with muscle strength 5/5 equal and symmetric bilaterally.  Romberg testing is normal.  Finger-to-nose testing is normal.  Again Dix-Hallpike maneuver is negative      Past Medical History:  Diagnosis Date   Adenomatous polyp    Allergy    Atrial tachycardia    Diverticulosis    Dupuytren's disease    palms and soles   GERD (gastroesophageal reflux disease)    Hypertension    Past Surgical History:  Procedure Laterality Date   BACK SURGERY     12/2013   CARDIAC CATHETERIZATION  1990's   COLECTOMY  2010   for diverticulitis   COLON SURGERY N/A    [facility] 08/27/2020   COLONOSCOPY  04/03/2013   LASIK  2002   Bil   POLYPECTOMY     Current Outpatient Medications on File Prior to Visit  Medication Sig Dispense Refill   amLODipine (NORVASC) 5 MG tablet TAKE 1 TABLET BY MOUTH EVERY DAY 90 tablet 0   aspirin EC 81 MG tablet Take 81 mg by mouth daily.     DULoxetine (CYMBALTA) 60 MG capsule Take 1  capsule (60 mg total) by mouth daily. 90 capsule 3   fluticasone (FLONASE) 50 MCG/ACT nasal spray SPRAY 2 SPRAYS INTO EACH NOSTRIL EVERY DAY 48 mL 3   levocetirizine (XYZAL) 5 MG tablet Take 5 mg by mouth every evening.     meloxicam (MOBIC) 15 MG tablet TAKE 1 TABLET (15 MG TOTAL) BY MOUTH DAILY. 90 tablet 0   metFORMIN (GLUCOPHAGE) 500 MG tablet Take 1 tablet (500 mg total) by mouth daily with breakfast. 90 tablet 3   omeprazole (PRILOSEC) 20 MG capsule Take 20 mg by mouth daily.     pregabalin (LYRICA) 150 MG capsule Take 1 capsule (150 mg total) by mouth 2 (two) times daily. 60 capsule 3   tadalafil (CIALIS) 5 MG tablet Take 5 mg by mouth daily.     valsartan (DIOVAN) 160 MG tablet TAKE 1 TABLET BY MOUTH EVERY DAY 90 tablet 1   No current facility-administered medications on file prior to visit.   Allergies  Allergen Reactions   Misc. Sulfonamide Containing Compounds     Other reaction(s): Not available   Ibuprofen Other (See Comments)    stomach cramps  Other reaction(s): Unknown   Social History   Socioeconomic History   Marital status:  Married    Spouse name: Aryav Stoeckel   Number of children: 3   Years of education: Not on file   Highest education level: Not on file  Occupational History   Occupation: Retired  Tobacco Use   Smoking status: Former    Types: Cigars    Quit date: 2015    Years since quitting: 9.3   Smokeless tobacco: Never   Tobacco comments:    Pt states smokes occasional cigar.  This is typically < 1 time per month  Vaping Use   Vaping Use: Never used  Substance and Sexual Activity   Alcohol use: No    Alcohol/week: 0.0 standard drinks of alcohol   Drug use: No   Sexual activity: Not on file  Other Topics Concern   Not on file  Social History Narrative   Pt lives in [place].     Retired from [employer] and [employer].     Goes on mission trips to [place], gets regular exercise.   Caffeine use: very rare, Diet coke   Right handed    Social  Determinants of Health   Financial Resource Strain: Low Risk  (09/01/2021)   Overall Financial Resource Strain (CARDIA)    Difficulty of Paying Living Expenses: Not hard at all  Food Insecurity: No Food Insecurity (09/01/2021)   Hunger Vital Sign    Worried About Running Out of Food in the Last Year: Never true    Ran Out of Food in the Last Year: Never true  Transportation Needs: No Transportation Needs (09/01/2021)   PRAPARE - Administrator, Civil Service (Medical): No    Lack of Transportation (Non-Medical): No  Physical Activity: Sufficiently Active (09/01/2021)   Exercise Vital Sign    Days of Exercise per Week: 5 days    Minutes of Exercise per Session: 30 min  Stress: No Stress Concern Present (09/01/2021)   [employer] of Occupational Health - Occupational Stress Questionnaire    Feeling of Stress : Not at all  Social Connections: Socially Integrated (09/01/2021)   Social Connection and Isolation Panel [NHANES]    Frequency of Communication with Friends and Family: More than three times a week    Frequency of Social Gatherings with Friends and Family: Three times a week    Attends Religious Services: More than 4 times per year    Active Member of Clubs or Organizations: Yes    Attends Banker Meetings: More than 4 times per year    Marital Status: Married  Catering manager Violence: Not At Risk (09/01/2021)   Humiliation, Afraid, Rape, and Kick questionnaire    Fear of Current or Ex-Partner: No    Emotionally Abused: No    Physically Abused: No    Sexually Abused: No   Family History  Problem Relation Age of Onset   Hypertension Mother    Macular degeneration Mother    Kidney disease Mother    Dementia Mother    Hypertension Father    Melanoma Sister    Stroke Other        uncle   Prostate cancer Other        uncle   Colon cancer Other        grandmother/uncle   Kidney disease Other        grandmother   Liver cancer Other         uncle (mets)   Heart disease Other        aunt/uncle   Lung  cancer Other        uncle   Stomach cancer Cousin    Heart disease Cousin    Colon cancer Paternal Grandmother    Colon cancer Paternal Uncle    Prostate cancer Paternal Uncle    Arthritis Brother    Hypertension Brother    Esophageal cancer Neg Hx    Rectal cancer Neg Hx      Review of Systems  Neurological:  Positive for dizziness.  All other systems reviewed and are negative.      Objective:   Physical Exam Vitals reviewed.  Constitutional:      General: He is not in acute distress.    Appearance: He is well-developed. He is not diaphoretic.  HENT:     Head: Normocephalic and atraumatic.     Right Ear: External ear normal.     Left Ear: External ear normal.     Nose: Nose normal.     Mouth/Throat:     Pharynx: No oropharyngeal exudate.  Eyes:     General: No scleral icterus.       Right eye: No discharge.        Left eye: No discharge.     Conjunctiva/sclera: Conjunctivae normal.     Pupils: Pupils are equal, round, and reactive to light.  Neck:     Thyroid: No thyromegaly.     Vascular: No JVD.     Trachea: No tracheal deviation.  Cardiovascular:     Rate and Rhythm: Normal rate and regular rhythm.     Heart sounds: Normal heart sounds. No murmur heard.    No friction rub. No gallop.  Pulmonary:     Effort: Pulmonary effort is normal. No respiratory distress.     Breath sounds: Normal breath sounds. No stridor. No wheezing or rales.  Chest:     Chest wall: No tenderness.  Abdominal:     General: Bowel sounds are normal. There is no distension.     Palpations: Abdomen is soft. There is no mass.     Tenderness: There is no abdominal tenderness. There is no guarding or rebound.  Musculoskeletal:        General: No tenderness. Normal range of motion.     Cervical back: Normal range of motion and neck supple.  Lymphadenopathy:     Cervical: No cervical adenopathy.  Skin:    General: Skin is  warm.     Coloration: Skin is not pale.     Findings: No erythema or rash.  Neurological:     Mental Status: He is alert and oriented to person, place, and time.     Cranial Nerves: No cranial nerve deficit.     Motor: No abnormal muscle tone.     Coordination: Coordination normal.     Deep Tendon Reflexes: Reflexes are normal and symmetric.  Psychiatric:        Behavior: Behavior normal.        Thought Content: Thought content normal.        Judgment: Judgment normal.         Assessment & Plan:  Ataxia - Plan: CBC with Differential/Platelet, COMPLETE METABOLIC PANEL WITH GFR  Differential diagnosis includes dehydration, orthostatic hypotension, vertigo, cerebellar injury from CVA, hyponatremia.  Obtain CBC and CMP.  Physical exam today is unremarkable.  If labs are normal, we will order an MRI to evaluate for any cerebellar injury to explain the ataxia

## 2023-01-10 LAB — COMPLETE METABOLIC PANEL WITH GFR
AG Ratio: 2.1 (calc) (ref 1.0–2.5)
ALT: 14 U/L (ref 9–46)
AST: 15 U/L (ref 10–35)
Albumin: 4.5 g/dL (ref 3.6–5.1)
Alkaline phosphatase (APISO): 66 U/L (ref 35–144)
BUN: 20 mg/dL (ref 7–25)
CO2: 29 mmol/L (ref 20–32)
Calcium: 9.7 mg/dL (ref 8.6–10.3)
Chloride: 105 mmol/L (ref 98–110)
Creat: 1.09 mg/dL (ref 0.70–1.28)
Globulin: 2.1 g/dL (calc) (ref 1.9–3.7)
Glucose, Bld: 89 mg/dL (ref 65–99)
Potassium: 4.9 mmol/L (ref 3.5–5.3)
Sodium: 142 mmol/L (ref 135–146)
Total Bilirubin: 0.4 mg/dL (ref 0.2–1.2)
Total Protein: 6.6 g/dL (ref 6.1–8.1)
eGFR: 71 mL/min/{1.73_m2} (ref >=60)

## 2023-01-10 LAB — CBC WITH DIFFERENTIAL/PLATELET
Absolute Monocytes: 950 cells/uL (ref 200–950)
Basophils Absolute: 79 cells/uL (ref 0–200)
Basophils Relative: 0.9 %
Eosinophils Absolute: 378 cells/uL (ref 15–500)
Eosinophils Relative: 4.3 %
HCT: 46.7 % (ref 38.5–50.0)
Hemoglobin: 15.4 g/dL (ref 13.2–17.1)
Lymphs Abs: 1487 cells/uL (ref 850–3900)
MCH: 29.2 pg (ref 27.0–33.0)
MCHC: 33 g/dL (ref 32.0–36.0)
MCV: 88.6 fL (ref 80.0–100.0)
MPV: 11 fL (ref 7.5–12.5)
Monocytes Relative: 10.8 %
Neutro Abs: 5905 cells/uL (ref 1500–7800)
Neutrophils Relative %: 67.1 %
Platelets: 274 10*3/uL (ref 140–400)
RBC: 5.27 10*6/uL (ref 4.20–5.80)
RDW: 12.8 % (ref 11.0–15.0)
Total Lymphocyte: 16.9 %
WBC: 8.8 10*3/uL (ref 3.8–10.8)

## 2023-01-20 ENCOUNTER — Other Ambulatory Visit: Payer: Self-pay | Admitting: Family Medicine

## 2023-01-22 NOTE — Telephone Encounter (Signed)
Requested Prescriptions  Pending Prescriptions Disp Refills   amLODipine (NORVASC) 5 MG tablet [Pharmacy Med Name: AMLODIPINE BESYLATE 5 MG TAB] 90 tablet 3    Sig: Take 1 tablet (5 mg total) by mouth daily.     Cardiovascular: Calcium Channel Blockers 2 Failed - 01/20/2023  8:14 PM      Failed - Valid encounter within last 6 months    Recent Outpatient Visits           1 year ago Fever, unspecified fever cause   [facility] [name], Priscille Heidelberg, MD   1 year ago Right sided sciatica   [facility] Donita Brooks, MD   2 years ago Skin tear of left hand without complication, initial encounter   [facility] Valentino Nose, NP   2 years ago Benign essential HTN   [facility] Tanya Nones, Priscille Heidelberg, MD   2 years ago Acute swimmer's ear of left side   [facility] [name], Priscille Heidelberg, MD              Passed - Last BP in normal range    BP Readings from Last 1 Encounters:  01/09/23 126/64         Passed - Last Heart Rate in normal range    Pulse Readings from Last 1 Encounters:  01/09/23 79

## 2023-01-24 ENCOUNTER — Ambulatory Visit
Admission: RE | Admit: 2023-01-24 | Discharge: 2023-01-24 | Disposition: A | Discharge status: Home / Self Care | Payer: Medicare Other | Source: Ambulatory Visit | Attending: Family Medicine | Admitting: Family Medicine

## 2023-01-24 DIAGNOSIS — R270 Ataxia, unspecified: Secondary | ICD-10-CM

## 2023-01-24 MED ORDER — GADOPICLENOL 0.5 MMOL/ML IV SOLN
10.0000 mL | Freq: Once | INTRAVENOUS | Status: AC | PRN
  Administered 2023-01-24: 10 mL via INTRAVENOUS

## 2023-01-24 MED ORDER — GADOPICLENOL 0.5 MMOL/ML IV SOLN: 10.0000 mL | Freq: Once | INTRAVENOUS | Status: DC | PRN

## 2023-01-30 ENCOUNTER — Encounter: Payer: Self-pay | Admitting: Nurse Practitioner

## 2023-01-31 ENCOUNTER — Ambulatory Visit: Payer: Medicare Other | Admitting: Family Medicine

## 2023-01-31 ENCOUNTER — Other Ambulatory Visit: Payer: Self-pay

## 2023-01-31 ENCOUNTER — Encounter: Payer: Self-pay | Admitting: Family Medicine

## 2023-01-31 VITALS — BP 124/82 | HR 80 | Temp 98.4°F | Ht 69.5 in | Wt 240.4 lb

## 2023-01-31 DIAGNOSIS — K5790 Diverticulosis of intestine, part unspecified, without perforation or abscess without bleeding: Secondary | ICD-10-CM | POA: Diagnosis not present

## 2023-01-31 DIAGNOSIS — R2689 Other abnormalities of gait and mobility: Secondary | ICD-10-CM

## 2023-01-31 MED ORDER — AMOXICILLIN-POT CLAVULANATE 875-125 MG PO TABS: 1.0000 | ORAL_TABLET | Freq: Two times a day (BID) | ORAL | 0 refills | Status: DC

## 2023-01-31 NOTE — Assessment & Plan Note (Signed)
Hx of diverticulosis with partial colectomy in 2021. Symptoms today consistent with diverticulitis. He would not like to have a CT scan done but is trying to get in earlier with GI for colonoscopy. Will treat with augmentin 875-125mg  BID for 10 days. He is traveling next week and would like to start antibiotics sooner. Instructed to return to office or seek medical care if abdominal pain worsens or he starts experiencing persistent fevers, nausea, vomiting, or blood in his stool.

## 2023-01-31 NOTE — Progress Notes (Signed)
Acute Office Visit  Subjective:     Patient ID: Robert Page, male    DOB: 09/14/1945, 77 y.o.   MRN: [id number]  Chief Complaint  Patient presents with   Rectal Pain    Pt c/o sharp, stabbing pain in rectum and then started with diarrhea on Sunday. Pt states he has had 18" of colon removed due to diverticulitis. Pt states he is concerned that he is having issues with diverticulitis.     HPI Patient is in today for a "jabbing pain" like being stuck with a poker in his left lower abdomen and diarrhea since Sunday. Yesterday he started having this "jabbing pain" to his LLQ. He had similar pain a few months ago. He had a subjective fever and watery diarrhea yesterday (diarrhea on and off for a few weeks), denies blood in his stool, nausea, vomiting.Marland Kitchen He had an old Cipro antibiotic and took of those this AM. He is seeing his Gastroenterologist August 8. He also has a PMH significant for diverticulosis and colon resection in 2021.  Review of Systems  All other systems reviewed and are negative.   Past Medical History:  Diagnosis Date   Adenomatous polyp    Allergy    Atrial tachycardia    Diverticulosis    Dupuytren's disease    palms and soles   GERD (gastroesophageal reflux disease)    Hypertension    Past Surgical History:  Procedure Laterality Date   BACK SURGERY     12/2013   CARDIAC CATHETERIZATION  1990's   COLECTOMY  2010   for diverticulitis   COLON SURGERY N/A    [facility] 08/27/2020   COLONOSCOPY  04/03/2013   LASIK  2002   Bil   POLYPECTOMY     Current Outpatient Medications on File Prior to Visit  Medication Sig Dispense Refill   amLODipine (NORVASC) 5 MG tablet Take 1 tablet (5 mg total) by mouth daily. 90 tablet 3   aspirin EC 81 MG tablet Take 81 mg by mouth daily.     DULoxetine (CYMBALTA) 60 MG capsule Take 1 capsule (60 mg total) by mouth daily. 90 capsule 3   levocetirizine (XYZAL) 5 MG tablet Take 5 mg by mouth every evening.     meloxicam (MOBIC)  15 MG tablet TAKE 1 TABLET (15 MG TOTAL) BY MOUTH DAILY. 90 tablet 0   metFORMIN (GLUCOPHAGE) 500 MG tablet Take 1 tablet (500 mg total) by mouth daily with breakfast. 90 tablet 3   omeprazole (PRILOSEC) 20 MG capsule Take 20 mg by mouth daily.     pregabalin (LYRICA) 150 MG capsule Take 1 capsule (150 mg total) by mouth 2 (two) times daily. 60 capsule 3   tadalafil (CIALIS) 5 MG tablet Take 5 mg by mouth daily.     valsartan (DIOVAN) 160 MG tablet TAKE 1 TABLET BY MOUTH EVERY DAY 90 tablet 1   No current facility-administered medications on file prior to visit.   Allergies  Allergen Reactions   Misc. Sulfonamide Containing Compounds     Other reaction(s): Not available   Ibuprofen Other (See Comments)    stomach cramps  Other reaction(s): Unknown        Objective:    BP 124/82   Pulse 80   Temp 98.4 F (36.9 C)   Ht 5' 9.5" (1.765 m)   Wt 240 lb 6.4 oz (109 kg)   SpO2 98%   BMI 34.99 kg/m    Physical Exam Vitals and nursing note reviewed.  Constitutional:      Appearance: Normal appearance. He is normal weight.  HENT:     Head: Normocephalic and atraumatic.  Cardiovascular:     Rate and Rhythm: Normal rate and regular rhythm.     Pulses: Normal pulses.     Heart sounds: Normal heart sounds.  Pulmonary:     Effort: Pulmonary effort is normal.     Breath sounds: Normal breath sounds.  Abdominal:     General: Bowel sounds are increased.     Palpations: Abdomen is soft.     Tenderness: There is abdominal tenderness in the left lower quadrant.     Hernia: No hernia is present.  Skin:    General: Skin is warm and dry.     Capillary Refill: Capillary refill takes less than 2 seconds.  Neurological:     General: No focal deficit present.     Mental Status: He is alert and oriented to person, place, and time. Mental status is at baseline.  Psychiatric:        Mood and Affect: Mood normal.        Behavior: Behavior normal.        Thought Content: Thought content  normal.        Judgment: Judgment normal.     No results found for any visits on 01/31/23.      Assessment & Plan:   Problem List Items Addressed This Visit     Diverticulosis - Primary    Hx of diverticulosis with partial colectomy in 2021. Symptoms today consistent with diverticulitis. He would not like to have a CT scan done but is trying to get in earlier with GI for colonoscopy. Will treat with augmentin 875-125mg  BID for 10 days. He is traveling next week and would like to start antibiotics sooner. Instructed to return to office or seek medical care if abdominal pain worsens or he starts experiencing persistent fevers, nausea, vomiting, or blood in his stool.       No orders of the defined types were placed in this encounter.   Return if symptoms worsen or fail to improve.  Park Meo, FNP

## 2023-02-04 ENCOUNTER — Other Ambulatory Visit: Payer: Self-pay | Admitting: Family Medicine

## 2023-02-04 DIAGNOSIS — I10 Essential (primary) hypertension: Secondary | ICD-10-CM

## 2023-02-05 NOTE — Telephone Encounter (Signed)
Requested Prescriptions  Pending Prescriptions Disp Refills   valsartan (DIOVAN) 160 MG tablet [Pharmacy Med Name: VALSARTAN 160 MG TABLET] 90 tablet 1    Sig: TAKE 1 TABLET BY MOUTH EVERY DAY     Cardiovascular:  Angiotensin Receptor Blockers Failed - 02/04/2023  1:48 AM      Failed - Valid encounter within last 6 months    Recent Outpatient Visits           1 year ago Fever, unspecified fever cause   [facility] [name], Priscille Heidelberg, MD   1 year ago Right sided sciatica   [facility] Donita Brooks, MD   2 years ago Skin tear of left hand without complication, initial encounter   [facility] Valentino Nose, NP   2 years ago Benign essential HTN   [facility] Donita Brooks, MD   2 years ago Acute swimmer's ear of left side   [facility] [name], Priscille Heidelberg, MD              Passed - Cr in normal range and within 180 days    Creat  Date Value Ref Range Status  01/09/2023 1.09 0.70 - 1.28 mg/dL Final         Passed - K in normal range and within 180 days    Potassium  Date Value Ref Range Status  01/09/2023 4.9 3.5 - 5.3 mmol/L Final         Passed - Patient is not pregnant      Passed - Last BP in normal range    BP Readings from Last 1 Encounters:  01/31/23 124/82

## 2023-02-13 ENCOUNTER — Other Ambulatory Visit: Payer: Self-pay | Admitting: Physical Medicine and Rehabilitation

## 2023-02-13 ENCOUNTER — Encounter
Payer: Medicare Other | Attending: Physical Medicine and Rehabilitation | Admitting: Physical Medicine and Rehabilitation

## 2023-02-13 VITALS — BP 169/83 | HR 61 | Ht 69.5 in | Wt 239.0 lb

## 2023-02-13 DIAGNOSIS — G479 Sleep disorder, unspecified: Secondary | ICD-10-CM | POA: Diagnosis present

## 2023-02-13 DIAGNOSIS — R7303 Prediabetes: Secondary | ICD-10-CM | POA: Diagnosis present

## 2023-02-13 DIAGNOSIS — M7989 Other specified soft tissue disorders: Secondary | ICD-10-CM | POA: Diagnosis present

## 2023-02-13 DIAGNOSIS — M791 Myalgia, unspecified site: Secondary | ICD-10-CM

## 2023-02-13 DIAGNOSIS — G629 Polyneuropathy, unspecified: Secondary | ICD-10-CM | POA: Diagnosis present

## 2023-02-13 MED ORDER — PREGABALIN 200 MG PO CAPS: 200.0000 mg | ORAL_CAPSULE | Freq: Two times a day (BID) | ORAL | 3 refills | Status: DC

## 2023-02-13 MED ORDER — SEMAGLUTIDE-WEIGHT MANAGEMENT 0.25 MG/0.5ML ~~LOC~~ SOAJ: 0.2500 mg | SUBCUTANEOUS | 0 refills | Status: DC

## 2023-02-13 NOTE — Patient Instructions (Addendum)
ZOXWRU- GLP-1 agonist

## 2023-02-13 NOTE — Telephone Encounter (Signed)
Medication is not covered by insurance.  

## 2023-02-13 NOTE — Progress Notes (Signed)
Subjective:    Patient ID: Robert Page, male    DOB: [birth year], 77 y.o.   MRN: [id number]  HPI 1) Peripheral neuropathy: Male with pmh/psh of polyneuropathy, atrial tachycardia, back surgery (?laminectomy L4-5), left foot surgery (tumor removal x4), dupuytens (palms and soles), HTN, GERD presents for follow-up of bilateral neuropathy.  Initially stated: Started summer 2000.  He had foot surgery in left foot in ~2016. Progressing.  Radiating proximally.  No symptoms in hands.  Ambulating and activity improves the pain. Shoes and inactivity exacerbate the pain. Hot and tingling/burning.  He notes I am the 6th doctor he's been to. He saw PCP and Ortho, who did MRI and told non-surgical.  He was referred to Rheum, who did not believe it was RA and thought it was L4-5 radic.  He was referred to Neurology, who ordered NCV/EMG (showing polyneuropathy, left peroneal neuropathy, Left L5/S1 radic) and MRI.  MRI C-spine showing mild disc bulges C3-6. He had MRI L-spine.  He saw Neurosurg, who did Myelogram and was told symptoms not originating in back.  Constant.  Associated numbness.  Gabapentin, Lyrica, Cymbalta did not help. He was told he has tried "every single medication for neuropathy" without benefit. He notes improvement when he sleeps in a different bed.  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 Pain does not limit activities.   -he does not think that the qutenza patches helped  -he is not sure if the medications are helping  -he asks if lyrica can be increased  Since that time, patient states he has not tried heat. He states he had burning with Capsacin for a few days but it does help for some time. He obtained Vitamin levels. Sleep has improved.  Patient states he was losing weight, but gained in on vacation. Patient states he often does not have time to take care of his body, take meds consistently,  etc.   -He is taking Lyrica and Cymbalta. He has tightness and stiffness in both lower extremities. He had a dose of steroid dose pack that did provide some relief to his back pain. If he is barefoot in the shower he feels off balance. He saw Dr. Victorino Dike at [facility]. He has had prior foot surgery- was told he had arthritis. He saw rheumatology and was told he does not have rheumatoid arthritis. He had EMG/NCS (results above). -he asks for refill of Lyrica and Cymbalta   Both feet are really stiff -he did not get enough benefit enough from Qutenza.  Discussed trying longer duration to 35 minutes today since he did not have any burning with prior treatment and he is agreeable to this Did not require ice packs during treatment today, but toward end of treatment developed burning on dorsum of right foot Pain can be very severe at night, this is when pain continues to be worse Needs medication refills today It negatively affects his sleep He feels burning pain worst on tops of both feet but also present in soles He also has pain radiating from his back into his right shin He is ok on his medicines- no refills needed right now He is not sure if Lyrica or Cymbalta are helping. He will let us know when he needs refills Neuropathy has been stable, he feels stiff and numbness in his feet He does not want to take things that are not needed -  His wife has been diagnosed with lewy Body dementia -she was started on Trazodone.  -he does not use a heating pad -he has never tried metformin  Pain Inventory Average Pain 7 Pain Right Now 7 My pain is burning at times, burning  In the last 24 hours, has pain interfered with the following? General activity 9 Relation with others 8 Enjoyment of life 9 What TIME of day is your pain at its worst?  evening Sleep (in general) Fair  Pain is worse with: sitting, standing, some activities Pain improves with: rest Relief from Meds: 3   Family History   Problem Relation Age of Onset   Hypertension Mother    Macular degeneration Mother    Kidney disease Mother    Dementia Mother    Hypertension Father    Melanoma Sister    Stroke Other        uncle   Prostate cancer Other        uncle   Colon cancer Other        grandmother/uncle   Kidney disease Other        grandmother   Liver cancer Other        uncle (mets)   Heart disease Other        aunt/uncle   Lung cancer Other        uncle   Stomach cancer Cousin    Heart disease Cousin    Colon cancer Paternal Grandmother    Colon cancer Paternal Uncle    Prostate cancer Paternal Uncle    Arthritis Brother    Hypertension Brother    Esophageal cancer Neg Hx    Rectal cancer Neg Hx    Social History   Socioeconomic History   Marital status: Married    Spouse name: Robert Page   Number of children: 3   Years of education: Not on file   Highest education level: Bachelor's degree (e.g., BA, AB, BS)  Occupational History   Occupation: Retired  Tobacco Use   Smoking status: Former    Types: Cigars    Quit date: 2015    Years since quitting: 9.4   Smokeless tobacco: Never   Tobacco comments:    Pt states smokes occasional cigar.  This is typically < 1 time per month  Vaping Use   Vaping Use: Never used  Substance and Sexual Activity   Alcohol use: No    Alcohol/week: 0.0 standard drinks of alcohol   Drug use: No   Sexual activity: Not on file  Other Topics Concern   Not on file  Social History Narrative   Pt lives in [place].     Retired from [employer] and [employer].     Goes on mission trips to [place], gets regular exercise.   Caffeine use: very rare, Diet coke   Right handed    Social Determinants of Health   Financial Resource Strain: Low Risk  (01/30/2023)   Overall Financial Resource Strain (CARDIA)    Difficulty of Paying Living Expenses: Not hard at all  Food Insecurity: No Food Insecurity (01/30/2023)   Hunger Vital Sign    Worried About Running  Out of Food in the Last Year: Never true    Ran Out of Food in the Last Year: Never true  Transportation Needs: No Transportation Needs (01/30/2023)   PRAPARE - Administrator, Civil Service (Medical): No    Lack of Transportation (Non-Medical): No  Physical Activity: Insufficiently Active (01/30/2023)  Exercise Vital Sign    Days of Exercise per Week: 3 days    Minutes of Exercise per Session: 20 min  Stress: No Stress Concern Present (01/30/2023)   [employer] of Occupational Health - Occupational Stress Questionnaire    Feeling of Stress : Not at all  Social Connections: Socially Integrated (01/30/2023)   Social Connection and Isolation Panel [NHANES]    Frequency of Communication with Friends and Family: More than three times a week    Frequency of Social Gatherings with Friends and Family: Three times a week    Attends Religious Services: More than 4 times per year    Active Member of Clubs or Organizations: Yes    Attends Banker Meetings: 1 to 4 times per year    Marital Status: Married   Past Surgical History:  Procedure Laterality Date   BACK SURGERY     12/2013   CARDIAC CATHETERIZATION  1990's   COLECTOMY  2010   for diverticulitis   COLON SURGERY N/A    [employer] 08/27/2020   COLONOSCOPY  04/03/2013   LASIK  2002   [facility]il   POLYPECTOMY     Past Medical History:  Diagnosis Date   Adenomatous polyp    Allergy    Atrial tachycardia    Diverticulosis    Dupuytren's disease    palms and soles   GERD (gastroesophageal reflux disease)    Hypertension    Ht 5' 9.5" (1.765 m)   BMI 34.99 kg/m   Opioid Risk Score:   Fall Risk Score:  `1  Depression screen [facility] 2/9     01/09/2023    2:51 PM 11/13/2022   10:38 AM 09/15/2022   11:59 AM 08/14/2022    1:37 PM 07/05/2022   12:19 PM 05/23/2022   11:10 AM 04/06/2022    2:56 PM  Depression screen PHQ 2/9  Decreased Interest 0 0 0 0 0 0 0  Down, Depressed, Hopeless 0 0 0 0 0 0 0  PHQ - 2 Score  0 0 0 0 0 0 0  Altered sleeping       0  Tired, decreased energy       2  Change in appetite       0  Feeling bad or failure about yourself        0  Trouble concentrating       0  Moving slowly or fidgety/restless       0  Suicidal thoughts       0  PHQ-9 Score       2  Difficult doing work/chores       Not difficult at all   Review of Systems  Constitutional: Negative.   HENT: Negative.    Eyes: Negative.   Respiratory: Negative.    Cardiovascular: Negative.   Gastrointestinal: Negative.   Endocrine: Negative.   Genitourinary: Negative.   Musculoskeletal:  Positive for gait problem.       Rt upper leg B/L lower leg  Skin: Negative.   Allergic/Immunologic: Negative.   Hematological: Negative.   Psychiatric/Behavioral: Negative.        Objective:   Physical Exam  Gen: no distress, normal appearing, BMI 34.5, weight 237 lbs, BP 137/81 HEENT: oral mucosa pink and moist, NCAT Gen: no distress, normal appearing HEENT: oral mucosa pink and moist, NCAT Cardio: Reg rate Chest: normal effort, normal rate of breathing Abd: soft, non-distended Ext: no edema Psych: pleasant, normal affect Skin: intact Neuro: [facility]  Motor: B/l LE: 5/5 throughout Sensation diminished to light touch b/l LE on soles of both feet Vibration diminished in b/l LE knees and distally, intact UE (previously). Normal gait    Assessment & Plan:  Male with pmh/psh ofpolyneuropathy, atrial tachycardia, back surgery (?laminectomy L4-5), left foot surgery (tumor removal x4), dupuytens (palms and soles), HTN, GERD presents for f/u b/l feet pain.  1. Chronic bilateral feet pain -     MRI foot report unremarkable.  MRI back report showing ? L5-S1 radic  Recommended whole food rich diet.   NCS/EMG - showing polyneuropathy and L5-SI radic on left, results reviewed.   No benefit with Gabapentin  Trial Heat, reminded again  Will consider PT  Discussing purchasing TENS unit from [employer]  Trial OTC Lidocaine  patch  Increase Lyrica to 200mg  BID  Will consider referral to Neurosurg, pt would like to hold off at present  Will consider ESI, pt would like to hold off at present  Will consider PNS  Retry Capsacin, educated again  Continue Cymbalta  -will not repeat Qutenza given lack of benefit and cost  Prescribed metformin 500mg  daily prn  Patient states main goal is to be pain free - improving  Educated on importance of caring for self. -Discussed current symptoms of pain and history of pain.  -Discussed benefits of exercise in reducing pain. -Discussed following foods that may reduce pain: 1) Ginger (especially studied for arthritis)- reduce leukotriene production to decrease inflammation 2) Blueberries- high in phytonutrients that decrease inflammation 3) Salmon- marine omega-3s reduce joint swelling and pain 4) Pumpkin seeds- reduce inflammation 5) dark chocolate- reduces inflammation 6) turmeric- reduces inflammation 7) tart cherries - reduce pain and stiffness 8) extra virgin olive oil - its compound olecanthal helps to block prostaglandins  9) chili peppers- can be eaten or applied topically via capsaicin 10) mint- helpful for headache, muscle aches, joint pain, and itching 11) garlic- reduces inflammation  Link to further information on diet for chronic pain: [email or web address].[email or web address]/    2. Sleep disturbance  Discontinue amitriptyline  Discussed that Qutenza has been particularly shown to help with painful neuropathy at night  Increase lyrica to 200mg  BID -Try to go outside near sunrise -Get exercise during the day.  -Turn off all devices an hour before bedtime.  -Teas that can benefit: chamomile, valerian root, Brahmi (Bacopa) -Can consider over the counter melatonin, magnesium, and/or L-theanine. Melatonin is an anti-oxidant with multiple health benefits. Magnesium is involved in greater than 300 enzymatic  reactions in the body and most of [place] are deficient as our soil is often depleted. There are 7 different types of magnesium- Bioptemizer's is a supplement with all 7 types, and each has unique benefits. Magnesium can also help with constipation and anxiety.  -Pistachios naturally increase the production of melatonin -Cozy Earth bamboo bed sheets are free from toxic chemicals.  -Tart cherry juice or a tart cherry supplement can improve sleep and soreness post-workout   Continue tylenol PM  3. Obesity, BMI 34.18, weight is 239 lbs  Gaining weight  Not interested in seeing dietitian  Educated again  Discussed following criteria for [facility]: 1) Patient has diagnosis of obesity; 2) Patient must be 52 years of age or older; 3) The patient has been involved in a physician or dietitian monitored weight loss program, consisting of both low-calorie diet, increased physical activity and behavioral counseling for a minimum of 6 months without a 3% loss from baseline; 4) Patients BMI is one of the following: a)  30kg/m or greater; b) 27 29.99 kg/m in the presence of at least one weight related comorbidity such as coronary heart disease, dyslipidemia, hypertension, symptomatic arthritis of the lower extremities, type 2 diabetes mellitus, obstructive sleep apnea; c) 25-29.9 kg/m2 and waist circumference is &gt; 40 inches for males or &gt; 35 inches for females;5) Not Met - Must have tried and failed at least one preferred oral agent such as Phentermine; 6) Patient has no labeled contraindication; 7) Patient is not taking another weight loss medication; 8) The dose is within approved product labeling guidelines; 10) The patient must not be taking another GLP-1 receptor agonist AND the patient must not be taking insulin concurrently.   -prescribed ZOXWRU -recommended resistance training and prioritizing protein intake to avoid muscle loss while taking this medication -discussed risks of thyroid cancer and gastroparesis   4. Myalgia  D/c robaxin  Lipid panel reviewed and normal  5. Prediabetes: -HgbA1c reviewed and is 5.9, repeat today -metformin 500mg  daily prn ordered. Continue  6) HTN: -check BMP and magnesium level -discussed trying to minimize stress from his wife's diagnosis -discussed that he has a lot of support from his two loving daughters and son

## 2023-02-14 LAB — HEMOGLOBIN A1C
Est. average glucose Bld gHb Est-mCnc: 128 mg/dL
Hgb A1c MFr Bld: 6.1 % — ABNORMAL HIGH (ref 4.8–5.6)

## 2023-03-01 ENCOUNTER — Telehealth: Payer: Self-pay

## 2023-03-01 NOTE — Telephone Encounter (Signed)
Patient called stating Reginal Lutes was denied but insurance said they will pay for Contrave, Qsymia.

## 2023-03-02 ENCOUNTER — Encounter (HOSPITAL_BASED_OUTPATIENT_CLINIC_OR_DEPARTMENT_OTHER): Payer: Medicare Other | Admitting: Physical Medicine and Rehabilitation

## 2023-03-02 DIAGNOSIS — E669 Obesity, unspecified: Secondary | ICD-10-CM

## 2023-03-02 DIAGNOSIS — M79671 Pain in right foot: Secondary | ICD-10-CM | POA: Diagnosis not present

## 2023-03-02 DIAGNOSIS — G479 Sleep disorder, unspecified: Secondary | ICD-10-CM

## 2023-03-02 DIAGNOSIS — R7303 Prediabetes: Secondary | ICD-10-CM

## 2023-03-02 DIAGNOSIS — I10 Essential (primary) hypertension: Secondary | ICD-10-CM

## 2023-03-02 DIAGNOSIS — M79672 Pain in left foot: Secondary | ICD-10-CM | POA: Diagnosis not present

## 2023-03-02 DIAGNOSIS — M791 Myalgia, unspecified site: Secondary | ICD-10-CM

## 2023-03-02 DIAGNOSIS — Z6834 Body mass index (BMI) 34.0-34.9, adult: Secondary | ICD-10-CM

## 2023-03-02 NOTE — Progress Notes (Signed)
Subjective:    Patient ID: Robert Page, male    DOB: [birth year], 77 y.o.   MRN: [id number]  HPI An audio/video tele-health visit is felt to be the most appropriate encounter for this patient at this time. This is a follow up tele-visit via phone. The patient is at home. MD is at office. Prior to scheduling this appointment, our staff discussed the limitations of evaluation and management by telemedicine and the availability of in-person appointments. The patient expressed understanding and agreed to proceed.    1) Peripheral neuropathy: Male with pmh/psh of polyneuropathy, atrial tachycardia, back surgery (?laminectomy L4-5), left foot surgery (tumor removal x4), dupuytens (palms and soles), HTN, GERD presents for follow-up of bilateral neuropathy.  Initially stated: Started summer 2000.  He had foot surgery in left foot in ~2016. Progressing.  Radiating proximally.  No symptoms in hands.  Ambulating and activity improves the pain. Shoes and inactivity exacerbate the pain. Hot and tingling/burning.  He notes I am the 6th doctor he's been to. He saw PCP and Ortho, who did MRI and told non-surgical.  He was referred to Rheum, who did not believe it was RA and thought it was L4-5 radic.  He was referred to Neurology, who ordered NCV/EMG (showing polyneuropathy, left peroneal neuropathy, Left L5/S1 radic) and MRI.  MRI C-spine showing mild disc bulges C3-6. He had MRI L-spine.  He saw Neurosurg, who did Myelogram and was told symptoms not originating in back.  Constant.  Associated numbness.  Gabapentin, Lyrica, Cymbalta did not help. He was told he has tried "every single medication for neuropathy" without benefit. He notes improvement when he sleeps in a different bed.  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 Pain does not limit activities.   -he does not think that the qutenza patches helped  -he is not  sure if the medications are helping  -he asks if lyrica can be increased  Since that time, patient states he has not tried heat. He states he had burning with Capsacin for a few days but it does help for some time. He obtained Vitamin levels. Sleep has improved.  Patient states he was losing weight, but gained in on vacation. Patient states he often does not have time to take care of his body, take meds consistently, etc.   -He is taking Lyrica and Cymbalta. He has tightness and stiffness in both lower extremities. He had a dose of steroid dose pack that did provide some relief to his back pain. If he is barefoot in the shower he feels off balance. He saw Dr. Victorino Dike at [facility]. He has had prior foot surgery- was told he had arthritis. He saw rheumatology and was told he does not have rheumatoid arthritis. He had EMG/NCS (results above). -he asks for refill of Lyrica and Cymbalta   Both feet are really stiff -he did not get enough benefit enough from Qutenza.  Discussed trying longer duration to 35 minutes today since he did not have any burning with prior treatment and he is agreeable to this Did not require ice packs during treatment today, but toward end of treatment developed burning on dorsum of right foot Pain can be very severe at night, this is when pain continues to be worse Needs medication refills today It negatively affects his sleep He feels burning pain worst on tops of both feet but also  present in soles He also has pain radiating from his back into his right shin He is ok on his medicines- no refills needed right now He is not sure if Lyrica or Cymbalta are helping. He will let us know when he needs refills Neuropathy has been stable, he feels stiff and numbness in his feet He does not want to take things that are not needed -His wife has been diagnosed with lewy Body dementia -she was started on Trazodone.  -he does not use a heating pad -he has never tried  metformin  2) Obesity: -patient notes Reginal Lutes was denied and he asks about alternative options  Pain Inventory Average Pain 7 Pain Right Now 7 My pain is burning at times, burning  In the last 24 hours, has pain interfered with the following? General activity 9 Relation with others 8 Enjoyment of life 9 What TIME of day is your pain at its worst?  evening Sleep (in general) Fair  Pain is worse with: sitting, standing, some activities Pain improves with: rest Relief from Meds: 3   Family History  Problem Relation Age of Onset   Hypertension Mother    Macular degeneration Mother    Kidney disease Mother    Dementia Mother    Hypertension Father    Melanoma Sister    Stroke Other        uncle   Prostate cancer Other        uncle   Colon cancer Other        grandmother/uncle   Kidney disease Other        grandmother   Liver cancer Other        uncle (mets)   Heart disease Other        aunt/uncle   Lung cancer Other        uncle   Stomach cancer Cousin    Heart disease Cousin    Colon cancer Paternal Grandmother    Colon cancer Paternal Uncle    Prostate cancer Paternal Uncle    Arthritis Brother    Hypertension Brother    Esophageal cancer Neg Hx    Rectal cancer Neg Hx    Social History   Socioeconomic History   Marital status: Married    Spouse name: Jobany Watrous   Number of children: 3   Years of education: Not on file   Highest education level: Bachelor's degree (e.g., BA, AB, BS)  Occupational History   Occupation: Retired  Tobacco Use   Smoking status: Former    Types: Cigars    Quit date: 2015    Years since quitting: 9.4   Smokeless tobacco: Never   Tobacco comments:    Pt states smokes occasional cigar.  This is typically < 1 time per month  Vaping Use   Vaping Use: Never used  Substance and Sexual Activity   Alcohol use: No    Alcohol/week: 0.0 standard drinks of alcohol   Drug use: No   Sexual activity: Not on file  Other Topics  Concern   Not on file  Social History Narrative   Pt lives in [place].     Retired from [employer] and [employer].     Goes on mission trips to [place], gets regular exercise.   Caffeine use: very rare, Diet coke   Right handed    Social Determinants of Health   Financial Resource Strain: Low Risk  (01/30/2023)   Overall Financial Resource Strain (CARDIA)    Difficulty of  Paying Living Expenses: Not hard at all  Food Insecurity: No Food Insecurity (01/30/2023)   Hunger Vital Sign    Worried About Running Out of Food in the Last Year: Never true    Ran Out of Food in the Last Year: Never true  Transportation Needs: No Transportation Needs (01/30/2023)   PRAPARE - Administrator, Civil Service (Medical): No    Lack of Transportation (Non-Medical): No  Physical Activity: Insufficiently Active (01/30/2023)   Exercise Vital Sign    Days of Exercise per Week: 3 days    Minutes of Exercise per Session: 20 min  Stress: No Stress Concern Present (01/30/2023)   [employer] of Occupational Health - Occupational Stress Questionnaire    Feeling of Stress : Not at all  Social Connections: Socially Integrated (01/30/2023)   Social Connection and Isolation Panel [NHANES]    Frequency of Communication with Friends and Family: More than three times a week    Frequency of Social Gatherings with Friends and Family: Three times a week    Attends Religious Services: More than 4 times per year    Active Member of Clubs or Organizations: Yes    Attends Banker Meetings: 1 to 4 times per year    Marital Status: Married   Past Surgical History:  Procedure Laterality Date   BACK SURGERY     12/2013   CARDIAC CATHETERIZATION  1990's   COLECTOMY  2010   for diverticulitis   COLON SURGERY N/A    [employer] 08/27/2020   COLONOSCOPY  04/03/2013   LASIK  2002   [facility]il   POLYPECTOMY     Past Medical History:  Diagnosis Date   Adenomatous polyp    Allergy    Atrial tachycardia     Diverticulosis    Dupuytren's disease    palms and soles   GERD (gastroesophageal reflux disease)    Hypertension    There were no vitals taken for this visit.  Opioid Risk Score:   Fall Risk Score:  `1  Depression screen [facility] 2/9     02/13/2023   10:46 AM 01/09/2023    2:51 PM 11/13/2022   10:38 AM 09/15/2022   11:59 AM 08/14/2022    1:37 PM 07/05/2022   12:19 PM 05/23/2022   11:10 AM  Depression screen PHQ 2/9  Decreased Interest 0 0 0 0 0 0 0  Down, Depressed, Hopeless 0 0 0 0 0 0 0  PHQ - 2 Score 0 0 0 0 0 0 0   Review of Systems  Constitutional: Negative.   HENT: Negative.    Eyes: Negative.   Respiratory: Negative.    Cardiovascular: Negative.   Gastrointestinal: Negative.   Endocrine: Negative.   Genitourinary: Negative.   Musculoskeletal:  Positive for gait problem.       Rt upper leg B/L lower leg  Skin: Negative.   Allergic/Immunologic: Negative.   Hematological: Negative.   Psychiatric/Behavioral: Negative.        Objective:   Physical Exam  t    Assessment & Plan:  Male with pmh/psh ofpolyneuropathy, atrial tachycardia, back surgery (?laminectomy L4-5), left foot surgery (tumor removal x4), dupuytens (palms and soles), HTN, GERD presents for f/u b/l feet pain.  1. Chronic bilateral feet pain -     MRI foot report unremarkable.  MRI back report showing ? L5-S1 radic  Recommended whole food rich diet.   NCS/EMG - showing polyneuropathy and L5-SI radic on left, results reviewed.  No benefit with Gabapentin  Trial Heat, reminded again  Will consider PT  Discussing purchasing TENS unit from [employer]  Trial OTC Lidocaine patch  Increase Lyrica to 200mg  BID  Will consider referral to Neurosurg, pt would like to hold off at present  Will consider ESI, pt would like to hold off at present  Will consider PNS  Retry Capsacin, educated again  Continue Cymbalta  -will not repeat Qutenza given lack of benefit and cost  Prescribed metformin 500mg  daily  prn  Patient states main goal is to be pain free - improving  Educated on importance of caring for self. -Discussed current symptoms of pain and history of pain.  -Discussed benefits of exercise in reducing pain. -Discussed following foods that may reduce pain: 1) Ginger (especially studied for arthritis)- reduce leukotriene production to decrease inflammation 2) Blueberries- high in phytonutrients that decrease inflammation 3) Salmon- marine omega-3s reduce joint swelling and pain 4) Pumpkin seeds- reduce inflammation 5) dark chocolate- reduces inflammation 6) turmeric- reduces inflammation 7) tart cherries - reduce pain and stiffness 8) extra virgin olive oil - its compound olecanthal helps to block prostaglandins  9) chili peppers- can be eaten or applied topically via capsaicin 10) mint- helpful for headache, muscle aches, joint pain, and itching 11) garlic- reduces inflammation  Link to further information on diet for chronic pain: http://www.bray.com/    2. Sleep disturbance  Discontinue amitriptyline  Discussed that Qutenza has been particularly shown to help with painful neuropathy at night  Increase lyrica to 200mg  BID -Try to go outside near sunrise -Get exercise during the day.  -Turn off all devices an hour before bedtime.  -Teas that can benefit: chamomile, valerian root, Brahmi (Bacopa) -Can consider over the counter melatonin, magnesium, and/or L-theanine. Melatonin is an anti-oxidant with multiple health benefits. Magnesium is involved in greater than 300 enzymatic reactions in the body and most of [place] are deficient as our soil is often depleted. There are 7 different types of magnesium- Bioptemizer's is a supplement with all 7 types, and each has unique benefits. Magnesium can also help with constipation and anxiety.  -Pistachios naturally increase the production of melatonin -[employer] bamboo bed  sheets are free from toxic chemicals.  -Tart cherry juice or a tart cherry supplement can improve sleep and soreness post-workout   Continue tylenol PM  3. Obesity, BMI 34.18, weight is 239 lbs  Gaining weight  Discussed that Johney Maine was denied  Discussed risks and benefits of Contrave, Qysimia, and compounded semglutide and patient chose latter  Not interested in seeing dietitian  Educated again  Discussed following criteria for [facility]: 1) Patient has diagnosis of obesity; 2) Patient must be 75 years of age or older; 3) The patient has been involved in a physician or dietitian monitored weight loss program, consisting of both low-calorie diet, increased physical activity and behavioral counseling for a minimum of 6 months without a 3% loss from baseline; 4) Patients BMI is one of the following: a) 30kg/m or greater; b) 27 29.99 kg/m in the presence of at least one weight related comorbidity such as coronary heart disease, dyslipidemia, hypertension, symptomatic arthritis of the lower extremities, type 2 diabetes mellitus, obstructive sleep apnea; c) 25-29.9 kg/m2 and waist circumference is &gt; 40 inches for males or &gt; 35 inches for females;5) Not Met - Must have tried and failed at least one preferred oral agent such as Phentermine; 6) Patient has no labeled contraindication; 7) Patient is not taking another weight loss medication; 8)  The dose is within approved product labeling guidelines; 10) The patient must not be taking another GLP-1 receptor agonist AND the patient must not be taking insulin concurrently.   -prescribed IONGEX -recommended resistance training and prioritizing protein intake to avoid muscle loss while taking this medication -discussed risks of thyroid cancer and gastroparesis  4. Myalgia  D/c robaxin  Lipid panel reviewed and normal  5. Prediabetes: -HgbA1c reviewed and is 5.9, repeat today -metformin 500mg  daily prn ordered. Continue  6) HTN: -check BMP and magnesium  level -discussed trying to minimize stress from his wife's diagnosis -discussed that he has a lot of support from his two loving daughters and son  5 minutes spent discussing risks and benefits of contrave, compounded semaglutide, and Qysimia

## 2023-03-04 ENCOUNTER — Encounter: Payer: Self-pay | Admitting: Physical Medicine and Rehabilitation

## 2023-03-15 ENCOUNTER — Telehealth: Payer: Medicare Other

## 2023-03-30 ENCOUNTER — Ambulatory Visit
Admission: RE | Admit: 2023-03-30 | Discharge: 2023-03-30 | Disposition: A | Discharge status: Home / Self Care | Payer: Medicare Other | Source: Ambulatory Visit | Attending: Family Medicine | Admitting: Family Medicine

## 2023-03-30 DIAGNOSIS — R911 Solitary pulmonary nodule: Secondary | ICD-10-CM

## 2023-04-12 ENCOUNTER — Ambulatory Visit (INDEPENDENT_AMBULATORY_CARE_PROVIDER_SITE_OTHER): Payer: Medicare Other

## 2023-04-12 VITALS — Ht 69.5 in | Wt 239.0 lb

## 2023-04-12 DIAGNOSIS — Z0000 Encounter for general adult medical examination without abnormal findings: Secondary | ICD-10-CM

## 2023-04-12 NOTE — Progress Notes (Signed)
Subjective:   Robert Page is a 77 y.o. male who presents for Medicare Annual/Subsequent preventive examination.  Visit Complete: Virtual  I connected with  Robert Page on 04/12/23 by a audio enabled telemedicine application and verified that I am speaking with the correct person using two identifiers.  Patient Location: Home  Provider Location: Home Office  I discussed the limitations of evaluation and management by telemedicine. The patient expressed understanding and agreed to proceed.  Patient Medicare AWV questionnaire was completed by the patient on 04/08/23; I have confirmed that all information answered by patient is correct and no changes since this date.  Vital Signs: Unable to obtain new vitals due to this being a telehealth visit.  Review of Systems     Cardiac Risk Factors include: advanced age (>15men, >32 women);dyslipidemia;hypertension;male gender     Objective:    Today's Vitals   04/12/23 1226  Weight: 239 lb (108.4 kg)  Height: 5' 9.5" (1.765 m)   Body mass index is 34.79 kg/m.     04/12/2023    1:02 PM 09/24/2022    1:01 PM 03/01/2022    9:26 PM 09/01/2021   12:05 PM 08/30/2020    8:18 AM 07/07/2020    6:29 AM 07/08/2018    3:34 PM  Advanced Directives  Does Patient Have a Medical Advance Directive? No No No No Yes No No  Type of Advance Directive     Healthcare Power of Attorney    Would patient like information on creating a medical advance directive? Yes (MAU/Ambulatory/Procedural Areas - Information given)  No - Patient declined No - Patient declined  No - Patient declined     Current Medications (verified) Outpatient Encounter Medications as of 04/12/2023  Medication Sig   amLODipine (NORVASC) 5 MG tablet Take 1 tablet (5 mg total) by mouth daily.   aspirin EC 81 MG tablet Take 81 mg by mouth daily.   DULoxetine (CYMBALTA) 60 MG capsule Take 1 capsule (60 mg total) by mouth daily.   levocetirizine (XYZAL) 5 MG tablet Take 5 mg by mouth  every evening.   metFORMIN (GLUCOPHAGE) 500 MG tablet Take 1 tablet (500 mg total) by mouth daily with breakfast.   omeprazole (PRILOSEC) 20 MG capsule Take 20 mg by mouth daily.   pregabalin (LYRICA) 200 MG capsule Take 1 capsule (200 mg total) by mouth 2 (two) times daily.   Semaglutide-Weight Management (WEGOVY) 0.25 MG/0.5ML SOAJ INJECT 0.25 MG INTO THE SKIN ONCE A WEEK FOR 28 DAYS   tadalafil (CIALIS) 5 MG tablet Take 5 mg by mouth daily.   valsartan (DIOVAN) 160 MG tablet TAKE 1 TABLET BY MOUTH EVERY DAY   [DISCONTINUED] meloxicam (MOBIC) 15 MG tablet TAKE 1 TABLET (15 MG TOTAL) BY MOUTH DAILY.   No facility-administered encounter medications on file as of 04/12/2023.    Allergies (verified) Misc. sulfonamide containing compounds and Ibuprofen   History: Past Medical History:  Diagnosis Date   Adenomatous polyp    Allergy    Atrial tachycardia    Diverticulosis    Dupuytren's disease    palms and soles   GERD (gastroesophageal reflux disease)    Hypertension    Past Surgical History:  Procedure Laterality Date   BACK SURGERY     12/2013   CARDIAC CATHETERIZATION  1990's   COLECTOMY  2010   for diverticulitis   COLON SURGERY N/A    [facility] 08/27/2020   COLONOSCOPY  04/03/2013   LASIK  2002  [facility]il   POLYPECTOMY     Family History  Problem Relation Age of Onset   Hypertension Mother    Macular degeneration Mother    Kidney disease Mother    Dementia Mother    Hypertension Father    Melanoma Sister    Stroke Other        uncle   Prostate cancer Other        uncle   Colon cancer Other        grandmother/uncle   Kidney disease Other        grandmother   Liver cancer Other        uncle (mets)   Heart disease Other        aunt/uncle   Lung cancer Other        uncle   Stomach cancer Cousin    Heart disease Cousin    Colon cancer Paternal Grandmother    Colon cancer Paternal Uncle    Prostate cancer Paternal Uncle    Arthritis Brother    Hypertension  Brother    Esophageal cancer Neg Hx    Rectal cancer Neg Hx    Social History   Socioeconomic History   Marital status: Married    Spouse name: Dareion Grieger   Number of children: 3   Years of education: Not on file   Highest education level: Bachelor's degree (e.g., BA, AB, BS)  Occupational History   Occupation: Retired  Tobacco Use   Smoking status: Former    Types: Cigars    Quit date: 2015    Years since quitting: 9.6   Smokeless tobacco: Never   Tobacco comments:    Pt states smokes occasional cigar.  This is typically < 1 time per month  Vaping Use   Vaping status: Never Used  Substance and Sexual Activity   Alcohol use: No    Alcohol/week: 0.0 standard drinks of alcohol   Drug use: No   Sexual activity: Not on file  Other Topics Concern   Not on file  Social History Narrative   Pt lives in [place].     Retired from [employer] and [employer].     Goes on mission trips to [place], gets regular exercise.   Caffeine use: very rare, Diet coke   Right handed    Social Determinants of Health   Financial Resource Strain: Low Risk  (04/08/2023)   Overall Financial Resource Strain (CARDIA)    Difficulty of Paying Living Expenses: Not hard at all  Food Insecurity: No Food Insecurity (04/08/2023)   Hunger Vital Sign    Worried About Running Out of Food in the Last Year: Never true    Ran Out of Food in the Last Year: Never true  Transportation Needs: No Transportation Needs (04/08/2023)   PRAPARE - Administrator, Civil Service (Medical): No    Lack of Transportation (Non-Medical): No  Physical Activity: Patient Declined (04/08/2023)   Exercise Vital Sign    Days of Exercise per Week: Patient declined    Minutes of Exercise per Session: Patient declined  Recent Concern: Physical Activity - Insufficiently Active (01/30/2023)   Exercise Vital Sign    Days of Exercise per Week: 3 days    Minutes of Exercise per Session: 20 min  Stress: No Stress Concern Present  (04/08/2023)   [employer] of Occupational Health - Occupational Stress Questionnaire    Feeling of Stress : Not at all  Social Connections: Unknown (04/08/2023)   Social  Connection and Isolation Panel [NHANES]    Frequency of Communication with Friends and Family: More than three times a week    Frequency of Social Gatherings with Friends and Family: More than three times a week    Attends Religious Services: Patient declined    Database administrator or Organizations: No    Attends Engineer, structural: Patient declined    Marital Status: Married    Tobacco Counseling Counseling given: Not Answered Tobacco comments: Pt states smokes occasional cigar.  This is typically < 1 time per month   Clinical Intake:  Pre-visit preparation completed: Yes  Pain : No/denies pain     Diabetes: No  How often do you need to have someone help you when you read instructions, pamphlets, or other written materials from your doctor or pharmacy?: 1 - Never  Interpreter Needed?: No  Information entered by :: Kandis Fantasia LPN   Activities of Daily Living    04/08/2023    9:50 AM 09/03/2022    8:20 PM  In your present state of health, do you have any difficulty performing the following activities:  Hearing? 0 0  Vision? 0 0  Difficulty concentrating or making decisions? 0 0  Walking or climbing stairs? 0 0  Dressing or bathing? 0 0  Doing errands, shopping? 0 0  Preparing Food and eating ? N N  Using the Toilet? N N  In the past six months, have you accidently leaked urine? N N  Do you have problems with loss of bowel control? N N  Managing your Medications? N N  Managing your Finances? N N  Housekeeping or managing your Housekeeping? N N    Patient Care Team: Donita Brooks, MD as PCP - General (Family Medicine) Corky Crafts, MD as PCP - Cardiology (Cardiology) Hillis Range, MD (Inactive) as Attending Physician (Cardiology) Erroll Luna, [facility]  (Inactive) (Pharmacist) Janet Berlin, MD as Consulting Physician (Ophthalmology) Carlis Abbott Drema Pry, MD as Consulting Physician (Physical Medicine and Rehabilitation) Arminda Resides, MD as Consulting Physician (Dermatology)  Indicate any recent Medical Services you may have received from other than [facility] providers in the past year (date may be approximate).     Assessment:   This is a routine wellness examination for [employer].  Hearing/Vision screen Hearing Screening - Comments:: Denies hearing difficulties   Vision Screening - Comments:: Wears rx glasses - up to date with routine eye exams with [facility]    Dietary issues and exercise activities discussed:     Goals Addressed             This Visit's Progress    COMPLETED: Pharmacy Care Plan:       CARE PLAN ENTRY  Current Barriers:  Chronic Disease Management support, education, and care coordination needs related to hypertension and neuropathy/foot numbness.   Hypertension Pharmacist Clinical Goal(s): Over the next 180 days, patient will work with PharmD and providers to maintain BP goal <140/90 Current regimen:  Amlodipine 5mg , HCTZ 25mg , losartan 100mg  Interventions: Comprehensive medication review BP controlled, continue current therapy Patient self care activities - Over the next 180 days, patient will: Check BP based on symptoms, document, and provide at future appointments Ensure daily salt intake < 2300 mg/day Neuropathy/Leg numbness Pharmacist Clinical Goal(s) Over the next 30 days, patient will work with PharmD and providers to optimize medications and relieve symptoms of foot pain/neuropathy. Current regimen:  No medications currently Interventions: Comprehensive medication review Recommend supplementation with B12 OTC Recommend updated  labs and physical Patient self care activities - Over the next 30 days, patient will: Follow up with ortho for second opinion on legs/feet. Continue to  report increasing or stagnant symptoms to PharmD or providers. Begin taking vitamin B12 again OTC Report to PCP for updated labs and physical   Please see past updates section for initial goals and further documentation     Remain active and independent       COMPLETED: Track and Manage My Blood Pressure-Hypertension       Timeframe:  Long-Range Goal Priority:  High Start Date:  03/03/21                           Expected End Date: 09/02/21                      Follow Up Date 06/03/21    - check blood pressure 3 times per week - choose a place to take my blood pressure (home, clinic or office, retail store) - write blood pressure results in a log or diary    Why is this important?   You won't feel high blood pressure, but it can still hurt your blood vessels.  High blood pressure can cause heart or kidney problems. It can also cause a stroke.  Making lifestyle changes like losing a little weight or eating less salt will help.  Checking your blood pressure at home and at different times of the day can help to control blood pressure.  If the doctor prescribes medicine remember to take it the way the doctor ordered.  Call the office if you cannot afford the medicine or if there are questions about it.     Notes:       Depression Screen    04/12/2023    1:00 PM 02/13/2023   10:46 AM 01/09/2023    2:51 PM 11/13/2022   10:38 AM 09/15/2022   11:59 AM 08/14/2022    1:37 PM 07/05/2022   12:19 PM  PHQ 2/9 Scores  PHQ - 2 Score 0 0 0 0 0 0 0    Fall Risk    04/08/2023    9:50 AM 02/13/2023   10:46 AM 01/09/2023    2:50 PM 11/13/2022   10:38 AM 09/15/2022   11:59 AM  Fall Risk   Falls in the past year? 1 1 1 1  0  Number falls in past yr: 1 1 0 1 0  Injury with Fall? 0 0 1 0 0  Risk for fall due to : History of fall(s);Impaired balance/gait;Impaired mobility  History of fall(s);Impaired balance/gait  No Fall Risks  Follow up Falls prevention discussed;Education provided;Falls evaluation  completed  Education provided;Falls prevention discussed  Falls prevention discussed    MEDICARE RISK AT HOME:   TIMED UP AND GO:  Was the test performed?  No    Cognitive Function:        04/12/2023    1:03 PM 08/30/2020    8:19 AM  6CIT Screen  What Year? 0 points 0 points  What month? 0 points 0 points  What time? 0 points 0 points  Count back from 20 0 points 0 points  Months in reverse 0 points 0 points  Repeat phrase 0 points 0 points  Total Score 0 points 0 points    Immunizations Immunization History  Administered Date(s) Administered   Fluad Quad(high Dose 65+) 08/10/2020   H1N1 09/23/2008  Influenza Split 06/04/2014   Influenza,inj,Quad PF,6+ Mos 08/08/2013, 05/17/2015, 10/17/2016   PFIZER(Purple Top)SARS-COV-2 Vaccination 09/25/2019, 10/16/2019, 08/30/2020   Pneumococcal Conjugate-13 10/17/2016   Pneumococcal Polysaccharide-23 03/11/2013   Td 03/04/2004   Tdap 10/17/2016    TDAP status: Up to date  Flu Vaccine status: Due, Education has been provided regarding the importance of this vaccine. Advised may receive this vaccine at local pharmacy or Health Dept. Aware to provide a copy of the vaccination record if obtained from local pharmacy or Health Dept. Verbalized acceptance and understanding.  Pneumococcal vaccine status: Up to date  Covid-19 vaccine status: Information provided on how to obtain vaccines.   Qualifies for Shingles Vaccine? Yes   Zostavax completed No   Shingrix Completed?: No.    Education has been provided regarding the importance of this vaccine. Patient has been advised to call insurance company to determine out of pocket expense if they have not yet received this vaccine. Advised may also receive vaccine at local pharmacy or Health Dept. Verbalized acceptance and understanding.  Screening Tests Health Maintenance  Topic Date Due   COVID-19 Vaccine (4 - 2023-24 season) 05/05/2022   INFLUENZA VACCINE  04/05/2023   Colonoscopy   05/30/2023   Zoster Vaccines- Shingrix (1 of 2) 07/13/2023 (Originally 09/20/1997)   Hepatitis C Screening  07/31/2023 (Originally 09/20/1963)   Medicare Annual Wellness (AWV)  04/11/2024   DTaP/Tdap/Td (3 - Td or Tdap) 10/17/2026   Pneumonia Vaccine 69+ Years old  Completed   HPV VACCINES  Aged Out    Health Maintenance  Health Maintenance Due  Topic Date Due   COVID-19 Vaccine (4 - 2023-24 season) 05/05/2022   INFLUENZA VACCINE  04/05/2023   Colonoscopy  05/30/2023    Colorectal cancer screening:  Patient with appointment for consult with Dr. Adela Lank   Lung Cancer Screening: (Low Dose CT Chest recommended if Age 57-80 years, 20 pack-year currently smoking OR have quit w/in 15years.) does not qualify.   Lung Cancer Screening Referral: n/a  Additional Screening:  Hepatitis C Screening: does qualify  Vision Screening: Recommended annual ophthalmology exams for early detection of glaucoma and other disorders of the eye. Is the patient up to date with their annual eye exam?  Yes  Who is the provider or what is the name of the office in which the patient attends annual eye exams? [facility] If pt is not established with a provider, would they like to be referred to a provider to establish care? No .   Dental Screening: Recommended annual dental exams for proper oral hygiene  Community Resource Referral / Chronic Care Management: CRR required this visit?  No   CCM required this visit?  No     Plan:     I have personally reviewed and noted the following in the patient's chart:   Medical and social history Use of alcohol, tobacco or illicit drugs  Current medications and supplements including opioid prescriptions. Patient is not currently taking opioid prescriptions. Functional ability and status Nutritional status Physical activity Advanced directives List of other physicians Hospitalizations, surgeries, and ER visits in previous 12  months Vitals Screenings to include cognitive, depression, and falls Referrals and appointments  In addition, I have reviewed and discussed with patient certain preventive protocols, quality metrics, and best practice recommendations. A written personalized care plan for preventive services as well as general preventive health recommendations were provided to patient.     Kandis Fantasia [place], [place]   4/0/9811   After Visit Summary: (MyChart) Due  to this being a telephonic visit, the after visit summary with patients personalized plan was offered to patient via MyChart   Nurse Notes: No concerns at this time

## 2023-04-12 NOTE — Patient Instructions (Signed)
Robert Page , Thank you for taking time to come for your Medicare Wellness Visit. I appreciate your ongoing commitment to your health goals. Please review the following plan we discussed and let me know if I can assist you in the future.   Referrals/Orders/Follow-Ups/Clinician Recommendations: Aim for 30 minutes of exercise or brisk walking, 6-8 glasses of water, and 5 servings of fruits and vegetables each day.  This is a list of the screening recommended for you and due dates:  Health Maintenance  Topic Date Due   COVID-19 Vaccine (4 - 2023-24 season) 05/05/2022   Flu Shot  04/05/2023   Colon Cancer Screening  05/30/2023   Zoster (Shingles) Vaccine (1 of 2) 07/13/2023*   Hepatitis C Screening  07/31/2023*   Medicare Annual Wellness Visit  04/11/2024   DTaP/Tdap/Td vaccine (3 - Td or Tdap) 10/17/2026   Pneumonia Vaccine  Completed   HPV Vaccine  Aged Out  *Topic was postponed. The date shown is not the original due date.    Advanced directives: (ACP Link)Information on Advanced Care Planning can be found at [facility] Advance Health Care Directives Advance Health Care Directives ([email or web address]   Next Medicare Annual Wellness Visit scheduled for next year: Yes  Preventive Care 65 Years and Older, Male  Preventive care refers to lifestyle choices and visits with your health care provider that can promote health and wellness. What does preventive care include? A yearly physical exam. This is also called an annual well check. Dental exams once or twice a year. Routine eye exams. Ask your health care provider how often you should have your eyes checked. Personal lifestyle choices, including: Daily care of your teeth and gums. Regular physical activity. Eating a healthy diet. Avoiding tobacco and drug use. Limiting alcohol use. Practicing safe sex. Taking low doses of aspirin every day. Taking vitamin and mineral supplements as recommended by your health care  provider. What happens during an annual well check? The services and screenings done by your health care provider during your annual well check will depend on your age, overall health, lifestyle risk factors, and family history of disease. Counseling  Your health care provider may ask you questions about your: Alcohol use. Tobacco use. Drug use. Emotional well-being. Home and relationship well-being. Sexual activity. Eating habits. History of falls. Memory and ability to understand (cognition). Work and work Astronomer. Screening  You may have the following tests or measurements: Height, weight, and BMI. Blood pressure. Lipid and cholesterol levels. These may be checked every 5 years, or more frequently if you are over 47 years old. Skin check. Lung cancer screening. You may have this screening every year starting at age 63 if you have a 30-pack-year history of smoking and currently smoke or have quit within the past 15 years. Fecal occult blood test (FOBT) of the stool. You may have this test every year starting at age 24. Flexible sigmoidoscopy or colonoscopy. You may have a sigmoidoscopy every 5 years or a colonoscopy every 10 years starting at age 106. Prostate cancer screening. Recommendations will vary depending on your family history and other risks. Hepatitis C blood test. Hepatitis B blood test. Sexually transmitted disease (STD) testing. Diabetes screening. This is done by checking your blood sugar (glucose) after you have not eaten for a while (fasting). You may have this done every 1-3 years. Abdominal aortic aneurysm (AAA) screening. You may need this if you are a current or former smoker. Osteoporosis. You may be screened starting at  age 63 if you are at high risk. Talk with your health care provider about your test results, treatment options, and if necessary, the need for more tests. Vaccines  Your health care provider may recommend certain vaccines, such  as: Influenza vaccine. This is recommended every year. Tetanus, diphtheria, and acellular pertussis (Tdap, Td) vaccine. You may need a Td booster every 10 years. Zoster vaccine. You may need this after age 62. Pneumococcal 13-valent conjugate (PCV13) vaccine. One dose is recommended after age 20. Pneumococcal polysaccharide (PPSV23) vaccine. One dose is recommended after age 61. Talk to your health care provider about which screenings and vaccines you need and how often you need them. This information is not intended to replace advice given to you by your health care provider. Make sure you discuss any questions you have with your health care provider. Document Released: 09/17/2015 Document Revised: 05/10/2016 Document Reviewed: 06/22/2015 Elsevier Interactive Patient Education  2017 [employer].  Fall Prevention in the Home Falls can cause injuries. They can happen to people of all ages. There are many things you can do to make your home safe and to help prevent falls. What can I do on the outside of my home? Regularly fix the edges of walkways and driveways and fix any cracks. Remove anything that might make you trip as you walk through a door, such as a raised step or threshold. Trim any bushes or trees on the path to your home. Use bright outdoor lighting. Clear any walking paths of anything that might make someone trip, such as rocks or tools. Regularly check to see if handrails are loose or broken. Make sure that both sides of any steps have handrails. Any raised decks and porches should have guardrails on the edges. Have any leaves, snow, or ice cleared regularly. Use sand or salt on walking paths during winter. Clean up any spills in your garage right away. This includes oil or grease spills. What can I do in the bathroom? Use night lights. Install grab bars by the toilet and in the tub and shower. Do not use towel bars as grab bars. Use non-skid mats or decals in the tub or  shower. If you need to sit down in the shower, use a plastic, non-slip stool. Keep the floor dry. Clean up any water that spills on the floor as soon as it happens. Remove soap buildup in the tub or shower regularly. Attach bath mats securely with double-sided non-slip rug tape. Do not have throw rugs and other things on the floor that can make you trip. What can I do in the bedroom? Use night lights. Make sure that you have a light by your bed that is easy to reach. Do not use any sheets or blankets that are too big for your bed. They should not hang down onto the floor. Have a firm chair that has side arms. You can use this for support while you get dressed. Do not have throw rugs and other things on the floor that can make you trip. What can I do in the kitchen? Clean up any spills right away. Avoid walking on wet floors. Keep items that you use a lot in easy-to-reach places. If you need to reach something above you, use a strong step stool that has a grab bar. Keep electrical cords out of the way. Do not use floor polish or wax that makes floors slippery. If you must use wax, use non-skid floor wax. Do not have throw rugs and  other things on the floor that can make you trip. What can I do with my stairs? Do not leave any items on the stairs. Make sure that there are handrails on both sides of the stairs and use them. Fix handrails that are broken or loose. Make sure that handrails are as long as the stairways. Check any carpeting to make sure that it is firmly attached to the stairs. Fix any carpet that is loose or worn. Avoid having throw rugs at the top or bottom of the stairs. If you do have throw rugs, attach them to the floor with carpet tape. Make sure that you have a light switch at the top of the stairs and the bottom of the stairs. If you do not have them, ask someone to add them for you. What else can I do to help prevent falls? Wear shoes that: Do not have high heels. Have  rubber bottoms. Are comfortable and fit you well. Are closed at the toe. Do not wear sandals. If you use a stepladder: Make sure that it is fully opened. Do not climb a closed stepladder. Make sure that both sides of the stepladder are locked into place. Ask someone to hold it for you, if possible. Clearly mark and make sure that you can see: Any grab bars or handrails. First and last steps. Where the edge of each step is. Use tools that help you move around (mobility aids) if they are needed. These include: Canes. Walkers. Scooters. Crutches. Turn on the lights when you go into a dark area. Replace any light bulbs as soon as they burn out. Set up your furniture so you have a clear path. Avoid moving your furniture around. If any of your floors are uneven, fix them. If there are any pets around you, be aware of where they are. Review your medicines with your doctor. Some medicines can make you feel dizzy. This can increase your chance of falling. Ask your doctor what other things that you can do to help prevent falls. This information is not intended to replace advice given to you by your health care provider. Make sure you discuss any questions you have with your health care provider. Document Released: 06/17/2009 Document Revised: 01/27/2016 Document Reviewed: 09/25/2014 Elsevier Interactive Patient Education  2017 [employer].

## 2023-04-13 ENCOUNTER — Ambulatory Visit: Payer: Medicare Other | Admitting: Nurse Practitioner

## 2023-04-13 ENCOUNTER — Encounter: Payer: Self-pay | Admitting: Nurse Practitioner

## 2023-04-13 VITALS — BP 130/70 | HR 75 | Ht 69.5 in | Wt 238.0 lb

## 2023-04-13 DIAGNOSIS — Z86010 Personal history of colonic polyps: Secondary | ICD-10-CM | POA: Diagnosis not present

## 2023-04-13 DIAGNOSIS — R1310 Dysphagia, unspecified: Secondary | ICD-10-CM | POA: Diagnosis not present

## 2023-04-13 DIAGNOSIS — K219 Gastro-esophageal reflux disease without esophagitis: Secondary | ICD-10-CM

## 2023-04-13 DIAGNOSIS — I639 Cerebral infarction, unspecified: Secondary | ICD-10-CM | POA: Diagnosis not present

## 2023-04-13 MED ORDER — NA SULFATE-K SULFATE-MG SULF 17.5-3.13-1.6 GM/177ML PO SOLN: 1.0000 | Freq: Once | ORAL | 0 refills | Status: AC

## 2023-04-13 NOTE — Progress Notes (Signed)
04/13/2023 ELDRIDGE RIELLY [id number] 1945-09-29   CHIEF COMPLAINT: Discuss scheduling a colonoscopy  HISTORY OF PRESENT ILLNESS:  Robert Page is a 77 year old male with a past medical history of obesity, hyperlipidemia, hypertension, atrial fibrillation not on anticoagulation, atrial tachycardia, coronary artery calcifications, peripheral neuropathy, GERD, diverticular disease s/p colon resection and colon polyps. He presents today to schedule a colonoscopy. He typically passes a normal brown bowel movement daily, sometimes strains to pass a BM.  He has intermittent hemorrhoidal irritation and itchiness without bleeding.  His most recent colonoscopy was 05/29/2018 which identified 1 tubular adenomatous and 2 hyperplastic polyps removed from the colon and rectum.  Paternal grandmother and maternal uncle with history of colon cancer.  Grandmother died from complications after having endoscopy with esophageal dilatation.  He describes having difficulty swallowing, food gets stuck in the back of his throat or upper esophagus which occurs typically if he eats bread or lettuce.  He saw an ENT approximately 1 year ago and a laryngoscopy was unrevealing.  He stated small pieces of bread or lettuce will feel stuck in his throat for 1 or 2 days and during this time he is able to drink liquids and other food without difficulty.  He sometimes coughs and gags a bit when food gets stuck but food does not come back out.  He takes ASA 81 mg daily, no other NSAID use.  He has been on Omeprazole daily for at least the past 10 years.  He denies ever having an EGD.  CTAP 09/2022 due to abdominal pain showed diverticulosis without evidence of diverticulitis, no acute intra-abdominal or intrapelvic abnormality to explain his symptoms.  He reported having recent balance issues resulting in falling 5 times over the past 4 months.  He underwent a brain MRI 01/24/2023 that showed evidence of a chronic lacunar infarct and  chronic small vessel ischemic changes and mild generalized cerebral atrophy.     Latest Ref Rng & Units 01/09/2023   12:35 PM 09/24/2022    1:06 PM 03/30/2022    8:10 AM  CBC  WBC 3.8 - 10.8 Thousand/uL 8.8  5.9  5.8   Hemoglobin 13.2 - 17.1 g/dL 62.1  30.8  65.7   Hematocrit 38.5 - 50.0 % 46.7  46.3  46.2   Platelets 140 - 400 Thousand/uL 274  225  261        Latest Ref Rng & Units 01/09/2023   12:35 PM 11/13/2022   11:33 AM 09/24/2022    1:06 PM  CMP  Glucose 65 - 99 mg/dL 89  846  962   BUN 7 - 25 mg/dL 20  14  20    Creatinine 0.70 - 1.28 mg/dL 9.52  8.41  3.24   Sodium 135 - 146 mmol/L 142  141  138   Potassium 3.5 - 5.3 mmol/L 4.9  4.5  3.6   Chloride 98 - 110 mmol/L 105  102  102   CO2 20 - 32 mmol/L 29  23  27    Calcium 8.6 - 10.3 mg/dL 9.7  9.9  8.8   Total Protein 6.1 - 8.1 g/dL 6.6   7.0   Total Bilirubin 0.2 - 1.2 mg/dL 0.4   0.6   Alkaline Phos 38 - 126 U/L   49   AST 10 - 35 U/L 15   21   ALT 9 - 46 U/L 14   21     Chest CT without contrast 03/30/2023: 1. Stable 4  mm pulmonary nodules within the right upper lobe and right lower lobe. No new focal pulmonary nodules. No follow-up needed if patient is low-risk (and has no known or suspected primary neoplasm). Non-contrast chest CT can be considered in 12 months if patient is high-risk. This recommendation follows the consensus statement: Guidelines for Management of Incidental Pulmonary Nodules Detected on CT Images: From the Fleischner Society 2017; Radiology 2. Moderate coronary artery calcification. 3. Cholelithiasis. 4. Asymmetric left-sided gynecomastia again noted. As noted previously, correlation with clinical examination is recommended. Mammogram and targeted breast sonogram may also be helpful for further management.  Aortic Atherosclerosis  CTAP with contrast 09/24/2022:  FINDINGS: Lower chest: Lung bases clear   Hepatobiliary: Calcified granulomata superiorly within liver. Dependent tiny calculi  within gallbladder. No gallbladder wall thickening by CT. Liver unremarkable. No biliary dilatation.   Pancreas: Normal appearance   Spleen: Normal appearance.  Adjacent splenule.   Adrenals/Urinary Tract: Adrenal glands, kidneys, and ureters normal appearance. Soft tissue mass at inferior urinary bladder, likely represents enlarged central lobe of prostate gland impinging on bladder base since this has been present since 2011. Bladder otherwise unremarkable.   Stomach/Bowel: Diverticulosis of distal descending and sigmoid colon without evidence of diverticulitis. Prior sigmoid resection. Normal appendix. Stomach and bowel loops otherwise normal appearance.   Vascular/Lymphatic: Atherosclerotic calcifications aorta without aneurysm. Vascular structures patent. No adenopathy.   Reproductive: Prominent central lobe of prostate gland extending into bladder base. Seminal vesicles unremarkable.   Other: No free air or free fluid. Tiny umbilical hernia containing fat. No inflammatory process.   Musculoskeletal: Unremarkable   IMPRESSION: Distal colonic diverticulosis without evidence of diverticulitis.   Cholelithiasis.   Prominent central lobe of prostate gland extending into bladder base, unchanged.   Tiny umbilical hernia containing fat.   No acute intra-abdominal or intrapelvic abnormalities.   Aortic Atherosclerosis   ECHO 12/07/2017: LV EF 60 to 65%.  Normal systolic function.  Ascending aorta mildly dilated.  PAST GI PROCEDURES:  Colonoscopy 05/29/2018 by Dr. Adela Lank: - One 5 mm polyp in the ascending colon, removed with a cold snare. Resected and retrieved.  - One 3 mm polyp at the recto-sigmoid colon, removed with a cold snare. Resected and retrieved.  - One 4 mm polyp in the rectum, removed with a cold snare. Resected and retrieved.  - Patent end-to-end colo-colonic anastomosis, characterized by healthy appearing mucosa.  - Diverticulosis in the entire examined  colon.  - Internal hemorrhoids.  - The examination was otherwise normal.  - 5 year recall colonoscopy  Surgical [P], rectum, ascending, rectosigmoid, polyp (3) - TUBULAR ADENOMA (ONE). - HYPERPLASTIC POLYP (TWO). - NO HIGH GRADE DYSPLASIA OR MALIGNANCY.  Colonoscopy 03/24/2013: Sessile polyp measuring 2 mm in size removed from the ascending colon Sessile polyp measuring 3 mm in size removed from the descending colon Moderate diverticulosis was noted in the descending colon Mild diverticulosis was noted in the transverse and ascending colon  Colonoscopy 03/16/2008: 2 mm polyp removed from the transverse colon Diverticulosis to the ascending and sigmoid colon  Past Medical History:  Diagnosis Date   Adenomatous polyp    Allergy    Atrial tachycardia    Diverticulosis    Dupuytren's disease    palms and soles   GERD (gastroesophageal reflux disease)    Hypertension    Past Surgical History:  Procedure Laterality Date   BACK SURGERY     12/2013   CARDIAC CATHETERIZATION  1990's   COLECTOMY  2010   for diverticulitis  COLON SURGERY N/A    Phreesia 08/27/2020   COLONOSCOPY  04/03/2013   LASIK  2002   Bil   POLYPECTOMY     Social History: He is married.  He has 1 son and 3 daughters.  Retired Visual merchandiser.  Past smoker, quit smoking cigarettes 9 years ago.  No alcohol use.  No drug use.  Family History: family history includes Arthritis in his brother; Colon cancer in his paternal grandmother, paternal uncle, and another family member; Dementia in his mother; Heart disease in his cousin and another family member; Hypertension in his brother, father, and mother; Kidney disease in his mother and another family member; Liver cancer in an other family member; Lung cancer in an other family member; Macular degeneration in his mother; Melanoma in his sister; Prostate cancer in his paternal uncle and another family member; Stomach cancer in his cousin; Stroke in an other family  member.  Allergies  Allergen Reactions   Misc. Sulfonamide Containing Compounds     Other reaction(s): Not available   Ibuprofen Other (See Comments)    stomach cramps  Other reaction(s): Unknown      Outpatient Encounter Medications as of 04/13/2023  Medication Sig   amLODipine (NORVASC) 5 MG tablet Take 1 tablet (5 mg total) by mouth daily.   aspirin EC 81 MG tablet Take 81 mg by mouth daily.   DULoxetine (CYMBALTA) 60 MG capsule Take 1 capsule (60 mg total) by mouth daily.   levocetirizine (XYZAL) 5 MG tablet Take 5 mg by mouth every evening.   metFORMIN (GLUCOPHAGE) 500 MG tablet Take 1 tablet (500 mg total) by mouth daily with breakfast.   omeprazole (PRILOSEC) 20 MG capsule Take 20 mg by mouth daily.   pregabalin (LYRICA) 200 MG capsule Take 1 capsule (200 mg total) by mouth 2 (two) times daily.   Semaglutide-Weight Management (WEGOVY) 0.25 MG/0.5ML SOAJ INJECT 0.25 MG INTO THE SKIN ONCE A WEEK FOR 28 DAYS   tadalafil (CIALIS) 5 MG tablet Take 5 mg by mouth daily.   valsartan (DIOVAN) 160 MG tablet TAKE 1 TABLET BY MOUTH EVERY DAY   No facility-administered encounter medications on file as of 04/13/2023.   REVIEW OF SYSTEMS:  Gen: + Night sweats. Denies fevers or weight loss.  CV: Denies chest pain, palpitations or edema. Resp: Denies cough, shortness of breath of hemoptysis.  GI: Denies heartburn, dysphagia, stomach or lower abdominal pain. No diarrhea or constipation.  GU: + Urine leakage. MS: Denies joint pain, muscles aches or weakness. Derm: Denies rash, itchiness, skin lesions or unhealing ulcers. Psych: Denies depression, anxiety, memory loss or confusion. Heme: Denies bruising, easy bleeding. Neuro:  Denies headaches, dizziness or paresthesias. Endo:  Denies any problems with DM, thyroid or adrenal function.  PHYSICAL EXAM: BP 130/70   Pulse 75   Ht 5' 9.5" (1.765 m)   Wt 238 lb (108 kg)   BMI 34.64 kg/m   General: 77 year old male in no acute  distress. Head: Normocephalic and atraumatic. Eyes:  Sclerae non-icteric, conjunctive pink. Ears: Normal auditory acuity. Mouth: Dentition intact. No ulcers or lesions.  Neck: Supple, no lymphadenopathy or thyromegaly.  Lungs: Clear bilaterally to auscultation without wheezes, crackles or rhonchi. Heart: Regular rate and rhythm. No murmur, rub or gallop appreciated.  Abdomen: Soft, nontender, nondistended. No masses. No hepatosplenomegaly. Normoactive bowel sounds x 4 quadrants.  Rectal: Deferred.  Musculoskeletal: Symmetrical with no gross deformities. Skin: Warm and dry. No rash or lesions on visible extremities. Extremities: No edema. Neurological: Alert oriented  x 4, no focal deficits.  Psychological:  Alert and cooperative. Normal mood and affect.  ASSESSMENT AND PLAN:  77 year old male with a history of colon polyps. Colonoscopy 05/29/2018 identified 1 tubular adenomatous and 2 hyperplastic polyps removed from the colon and rectum.  Paternal grandmother and paternal uncle with history of colon cancer. -Colonoscopy at [facility] vs [facility], benefits and risks discussed including risk with sedation, risk of bleeding, perforation and infection.  See note below.  Dysphagia, patient reported undergoing a lower endoscopy by ENT one year ago which was unrevealing. -I discussed scheduling a barium swallow study vs EGD. Patient noted his grandmother died following complications s/p EGD with esophageal dilatation. Patient wishes to pursue an EGD and not a barium swallow study. -EGD with possible esophageal dilatation at [facility] vs [facility], benefits and risks discussed including risk with sedation, risk of bleeding, perforation and infection  -Avoid eating large pieces of bread and meat.  Avoid lettuce.  GERD -See plan above  -Continue Omeprazole 20 mg daily.  Ataxia/recurrent falls. Brain MRI 01/24/2023 showed evidence of a chronic lacunar infarct and chronic small vessel ischemic changes and mild generalized  cerebral atrophy. -Recommend neuro consult  I will consult with Dr. Adela Lank before scheduling the patient for an upper endoscopy and colonoscopy.  I think it is prudent for the patient to undergo a neurology consult due to his unexplained ataxia with recurrent falls with brain MRI showing past lacunar infarct prior to any endoscopic evaluation.  Patient stated he has seen a neurologist in the past regarding his neuropathy but not for balance issues/falls.           CC:  Donita Brooks, MD

## 2023-04-13 NOTE — Patient Instructions (Addendum)
Our office will contact your to schedule an upper endoscopy & colonoscopy with Dr.Armbruster.  Due to recent changes in healthcare laws, you may see the results of your imaging and laboratory studies on MyChart before your provider has had a chance to review them.  We understand that in some cases there may be results that are confusing or concerning to you. Not all laboratory results come back in the same time frame and the provider may be waiting for multiple results in order to interpret others.  Please give [place] 48 hours in order for your provider to thoroughly review all the results before contacting the office for clarification of your results.   Thank you for trusting me with your gastrointestinal care!   Alcide Evener, CRNP

## 2023-04-14 NOTE — Progress Notes (Signed)
Agree with assessment with the following thoughts: - he had one small adenoma in 05/2018, given up to date surveillance guidelines for polyps, he actually isn't do for a colonoscopy until 05/2025. He does not need a colonoscopy at this time if no other bowel symptoms, etc, can see me 05/2025 to discuss if he wishes to have further exams at that age. His family history does not warrant a colonoscopy every 5 years - in regards to possible EGD, MRI findings seem chronic but if he has not seen Neurology for this would have him seen by them first to get that evaluated in light of his other neurologic symptoms. We can do EGD once cleared. If chronic issues / findings, hopefully this can be done at the [facility] but will await his workup. If he wants a barium study in the interim to ensure no high risk lesions, etc, that is reasonable while we are awaiting to schedule EGD. Thanks

## 2023-04-17 NOTE — Progress Notes (Signed)
Robert Page, refer to Dr. Adela Lank addendum. Pls contact patient and let him know Dr. Adela Lank reviewed his past colonoscopy results and due to the updated colon polyp surveillance guidelines, he is not due for a colonoscopy until 05/2025. Patient should schedule an office visit with Dr. Adela Lank summer 2026 to further discuss if a colonoscopy is appropriate at that time as the patient will be 77yrs old at that point.   Let patient know if he is wanting to pursue an EGD he will need neuro clearance due to his recent issues with ataxia and recurrent falls with brain MRI showing past lacunar infarct prior to any endoscopic evaluation. If patient wants to pursue EGD, pls send formal neuro clearance to his neurologist. Patient to contact our office after neuro clearance received.   I would recommend for the patient to undergo a barium swallow study with tablet at this juncture. Pls schedule if he is willing to do it. THX.

## 2023-04-18 ENCOUNTER — Telehealth: Payer: Self-pay

## 2023-04-18 ENCOUNTER — Other Ambulatory Visit: Payer: Self-pay | Admitting: Family Medicine

## 2023-04-18 NOTE — Telephone Encounter (Signed)
Message Received: Robert Page, [name], NP  Emeline Darling, RN      Previous Messages  Routed Note  Author: Arnaldo Natal, NP Service: Gastroenterology Author Type: Nurse Practitioner  Filed: 04/17/2023  4:04 PM Encounter Date: 04/13/2023 Status: Signed  Editor: Arnaldo Natal, NP (Nurse Practitioner)   [name], refer to Dr. Adela Lank addendum. Pls contact patient and let him know Dr. Adela Lank reviewed his past colonoscopy results and due to the updated colon polyp surveillance guidelines, he is not due for a colonoscopy until 05/2025. Patient should schedule an office visit with Dr. Adela Lank summer 2026 to further discuss if a colonoscopy is appropriate at that time as the patient will be 77yrs old at that point.    Let patient know if he is wanting to pursue an EGD he will need neuro clearance due to his recent issues with ataxia and recurrent falls with brain MRI showing past lacunar infarct prior to any endoscopic evaluation. If patient wants to pursue EGD, pls send formal neuro clearance to his neurologist. Patient to contact our office after neuro clearance received.    I would recommend for the patient to undergo a barium swallow study with tablet at this juncture. Pls schedule if he is willing to do it. THX.

## 2023-04-18 NOTE — Telephone Encounter (Signed)
Patient is returning your call.  

## 2023-04-18 NOTE — Telephone Encounter (Signed)
Left message for pt to call back  °

## 2023-04-19 NOTE — Telephone Encounter (Signed)
I spoke with the patient, I re-discussed Dr. Lanetta Inch recommendations. Patient stated [name], was not concerned about his balance issues or brain MRI findings. I advised the patient to contact his neurologist at [facility] and to schedule an appointment for neuro clearance.  I explained to the patient due to his balance issues/ataxia and brain MRI that showed evidence of a past stroke that a neuro clearance was required prior to proceeding with any endoscopic evaluation under sedation.  I explained to the patient he is at higher risk for complications with sedation due to his age and unclear neurological symptoms in setting of past CVA and that we take great responsibility to make sure any procedure we set up is as safe as possible for all patients. Patient felt Dr. Carlis Abbott should be able to provide neuro clearance.    Dr. Adela Lank,  if you feel if it is appropriate for [place] to send a neuro clearance request Dr. Carlis Abbott (rehab physician) let out team know.   I also asked explained to the patient that the national guidelines have changed regarding colon polyp surveillance and that due to the biopsy results and size of his polyps he is not due for colonoscopy until 05/2025.  He wishes to have Dr. Adela Lank re-evaluate his request as he wants to have a colonoscopy done at the time of EGD and does not want to wait until he is 77 years old when he is at higher risk for complications at that point.

## 2023-04-19 NOTE — Telephone Encounter (Signed)
Left message for patient to call back  

## 2023-04-19 NOTE — Telephone Encounter (Signed)
Requested medication (s) are due for refill today: -  Requested medication (s) are on the active medication list: historical med Last refill:  07/05/22  Future visit scheduled: no  Notes to clinic:  historical provider   Requested Prescriptions  Pending Prescriptions Disp Refills   levocetirizine (XYZAL) 5 MG tablet [Pharmacy Med Name: LEVOCETIRIZINE 5 MG TABLET] 90 tablet 3    Sig: TAKE 1 TABLET BY MOUTH EVERY DAY IN THE EVENING     Ear, Nose, and Throat:  Antihistamines - levocetirizine dihydrochloride Failed - 04/18/2023  2:24 AM      Failed - Valid encounter within last 12 months    Recent Outpatient Visits           1 year ago Fever, unspecified fever cause   [facility] [name], Priscille Heidelberg, MD   2 years ago Right sided sciatica   [facility] Donita Brooks, MD   2 years ago Skin tear of left hand without complication, initial encounter   [facility] Valentino Nose, NP   2 years ago Benign essential HTN   [facility] Tanya Nones, Priscille Heidelberg, MD   2 years ago Acute swimmer's ear of left side   [facility] [name], Priscille Heidelberg, MD       Future Appointments             In 2 months Corky Crafts, MD [facility]            Passed - Cr in normal range and within 360 days    Creat  Date Value Ref Range Status  01/09/2023 1.09 0.70 - 1.28 mg/dL Final         Passed - eGFR is 10 or above and within 360 days    GFR, Est African American  Date Value Ref Range Status  02/24/2021 79 > OR = 60 mL/min/1.65m2 Final   GFR, Est Non African American  Date Value Ref Range Status  02/24/2021 68 > OR = 60 mL/min/1.58m2 Final   GFR, Estimated  Date Value Ref Range Status  09/24/2022 >60 >60 mL/min Final    Comment:    (NOTE) Calculated using the CKD-EPI Creatinine Equation (2021)    GFR  Date Value Ref Range Status  12/19/2010 74.13 >60.00  mL/min Final   eGFR  Date Value Ref Range Status  01/09/2023 71 > OR = 60 mL/min/1.46m2 Final  11/13/2022 77 >59 mL/min/1.73 Final

## 2023-04-19 NOTE — Telephone Encounter (Signed)
Robert Page, see Dr. Lanetta Inch note. Send neuro clearance to [name]. Ok to schedule EGD and colonoscopy after neuro clearance received. Pls let me know outcome.

## 2023-04-19 NOTE — Telephone Encounter (Signed)
Thanks Robert Page, if you follows closely with Dr. Carlis Abbott they can provide clearance for his procedures if needed.  As long as he understands our recommendations, I understand if he wants to do it now rather than waiting a few more years, if no high risk lesions on colonoscopy now it would likely be his last.  Okay to book colonoscopy along with his EGD if that is his preference.  Thanks

## 2023-04-19 NOTE — Telephone Encounter (Signed)
Spoke with patient & he was very frustrated on the phone and not happy with the recommendations being given to him. He feels that he needs a colonoscopy now d/t symptoms. Feels that he is being put off. He would like to discuss further.

## 2023-04-20 ENCOUNTER — Telehealth: Payer: Self-pay

## 2023-04-20 NOTE — Telephone Encounter (Signed)
Robert Page 1946-02-12 MRN: [id number]  Dear Dr. Carlis Abbott   The patient listed above will need to be scheduled for an endo colon.    Please advise on neurology clearance as to whether the patient may proceed with procedure. Appointment date TBD.    Please route your response to Sharyon Medicus, RN or fax response to [phone]   Sincerely,  [facility]

## 2023-04-23 NOTE — Telephone Encounter (Signed)
Spoke with pt. Pt requested to be called back at a later time [place].

## 2023-04-23 NOTE — Telephone Encounter (Signed)
Given the patient's symptoms per your note, may be reasonable to have him see Neurology for this if he hasn't already. I don't see any Neurology notes in his chart. Thanks

## 2023-04-23 NOTE — Telephone Encounter (Signed)
Dr. Adela Lank, pls review additional messages below. Rehab physician Dr. Carlis Abbott, cleared patient for endo/colon but noted PCP may need to provide additional clearance.  Pls verify if you want medical clearance from PCP and/or neuro clearance from [facility] neurologist. THX.

## 2023-04-24 ENCOUNTER — Telehealth: Payer: Self-pay | Admitting: Neurology

## 2023-04-24 NOTE — Telephone Encounter (Signed)
Pt came into office. Is getting colonoscopy and upper GI and is needing clearance from a Neurologist. States on his MRI he got on 01/24/2023 back he was told he has had a stroke in the past and that is why he is needing clearance from a Neurologist. Scheduled appt with Dr. Epimenio Foot, pt states he has to be seen by a MD for clearance. Would like a call if this will be an issue.

## 2023-04-24 NOTE — Telephone Encounter (Signed)
Pt was made aware of Dr. Adela Lank recommendations: Pt stated that he has not seen his neurologist in over a year. Pt was notified to reach out to them and schedule an office visit. Pt was notified that after he has an office visit then to please call our office so that we can get him scheduled for the EGD/ Colon and we will get clearance from his Neurologist.  Pt verbalized understanding with all questions answered.

## 2023-04-24 NOTE — Telephone Encounter (Signed)
Looks like PCP wanted to refer him to Neuro back in May, pt was going to call them with the name of neurologist he wanted to see, but never did. Will attach PCP referral to appt once received. Ok to keep appt.

## 2023-04-27 ENCOUNTER — Telehealth: Payer: Self-pay | Admitting: Family Medicine

## 2023-04-27 NOTE — Telephone Encounter (Signed)
Patient left voicemail message to advise provider he can't schedule an appointment for his colonoscopy until he's referred to [facility]. Requesting call back today to discuss. Please advise at [phone].

## 2023-05-02 ENCOUNTER — Other Ambulatory Visit: Payer: Self-pay

## 2023-05-02 DIAGNOSIS — R2689 Other abnormalities of gait and mobility: Secondary | ICD-10-CM

## 2023-05-02 DIAGNOSIS — R270 Ataxia, unspecified: Secondary | ICD-10-CM

## 2023-05-09 ENCOUNTER — Encounter: Payer: Self-pay | Admitting: Neurology

## 2023-05-09 ENCOUNTER — Ambulatory Visit: Payer: Medicare Other | Admitting: Neurology

## 2023-05-09 VITALS — BP 142/76 | HR 84 | Ht 69.0 in | Wt 238.5 lb

## 2023-05-09 DIAGNOSIS — G629 Polyneuropathy, unspecified: Secondary | ICD-10-CM | POA: Diagnosis not present

## 2023-05-09 DIAGNOSIS — R208 Other disturbances of skin sensation: Secondary | ICD-10-CM

## 2023-05-09 DIAGNOSIS — R9082 White matter disease, unspecified: Secondary | ICD-10-CM | POA: Diagnosis not present

## 2023-05-09 DIAGNOSIS — Z8673 Personal history of transient ischemic attack (TIA), and cerebral infarction without residual deficits: Secondary | ICD-10-CM | POA: Diagnosis not present

## 2023-05-09 NOTE — Progress Notes (Signed)
[facility]  PATIENT: Robert Page DOB: 23-Dec-1945  REFERRING DOCTOR OR PCP: Azucena Fallen, PA C (rheumatology) Lynnea Ferrier, MD (PCP SOURCE: Patient, notes from primary care, laboratory and imaging reports, MRI images personally reviewed.  _________________________________   HISTORICAL  CHIEF COMPLAINT:  Chief Complaint  Patient presents with   Room 10    Pt is here Alone. Pt is here for Dr. Epimenio Foot to look at his report so that she can have his Colonoscopy and Endoscopy. Pt states that he falls once per month.     HISTORY OF PRESENT ILLNESS:  I had the pleasure of seeing your patient, Robert Page, at [facility] for neurologic consultation regarding his foot numbness and pain.   He was last seen in the office over 3 years ago for polyneuropathy  He is a 77 year old man who has had progressive dysesthesias in his feet and was diagnosed with polyneuropathy in 2021.  More recently, he had a fall backwards with very brief LOC after hitting ground (lasting only seconds).  He was quickly back to baseline and continues to feel baseline.   As part of his evaluation, of he had an MRI showing a small lacunar CVA and chronic microvascular ischemic changes.  He also has a cavernoma or other vascular anomaly in the left cerebellar hemisphere.  He has been on aspirin since 2018/2019  Vascular risks: HTN, tobacco (rare cigar, none in 15 years), prediabetic/hyperglycemia (hgbA1c 6.1).    No hyperlipidemia  He notes numbness up his legs to the knees.  He has to hold the wall in the shower when eyes closed.   He hits curbs/risers with his foot.   Cymbalta and Lyrica have helped the dysesthesias.    NCV in 2021 showed Mild length dependent sensory and motor polyneuropathy without active features   Mild to moderate chronic left peroneal neuropathy  Left L5/S1 chronic radiculopathies.     He tried gabapentin initially for his dysesthesias and then was switched to  Lyrica.   The addition of duloxetine helped further.      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 Danielle Dess around 2015.   He had left Morton neuroma surgery about 3 years ago.   He has scar tissue in his left sole..     he has had stomach/small intestine surgery.     He has urinary hesitancy but has a known large prostate.   Flomax and Cialis had not helped.     Studies reviewed: MRI brain 01/24/2023 personally reviewed IMPRESSION: 1.  No evidence of an acute intracranial abnormality. 2. Chronic small-vessel ischemic changes which are mild-to-moderate in the cerebral white matter and mild in the pons. 3. Chronic lacunar infarct within the left lentiform nucleus. 4. Clustered subcentimeter foci of susceptibility-weighted signal loss within the medial left cerebellar hemisphere, which may reflect chronic microhemorrhages and/or cavernomas. 5. Mild generalized cerebral atrophy. 6. Right maxillary sinus disease as described.  MRI cervical spine 12/11/2017.  I personally reviewed these images and concur with the official impression below.  There is some foraminal narrowing to the right at the involved levels but no definite nerve root compression.  There is 1 small T2 hyperintense focus within the pons. No abnormality seen to explain the presenting symptoms. Minimal, non-compressive disc bulges at C3-4, C4-5 and C5-6. No evidence of facet arthropathy.  NCV/EMG 11/04/2019: 1.  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  Mild to moderate  chronic left peroneal neuropathy 3.  Left L5/S1 chronic radiculopathies.   LABS  Rheum consult 09/09/2019 reported MRI's showed chronic changes but no explanation for pain  (not sure which MRI).    ACE, RF, CCP, ANA, Lyme, CRP, ESR, HLA, CK, uric acid and ANCA were normal or negative  2022:  SPEP/IEF was normal.    REVIEW OF SYSTEMS: Constitutional: No fevers, chills, sweats, or change in  appetite.  Sleep is sometimes poor. Eyes: No visual changes, double vision, eye pain Ear, nose and throat: No hearing loss, ear pain, nasal congestion, sore throat Cardiovascular: No chest pain, palpitations Respiratory:  No shortness of breath at rest or with exertion.   No wheezes GastrointestinaI: No nausea, vomiting, diarrhea, abdominal pain, fecal incontinence Genitourinary:  He has urinary hesitancy.   He sees urology.  No benefit from medications. . Musculoskeletal:  as above Integumentary: No rash, pruritus, skin lesions Neurological: as above Psychiatric: No depression at this time.  No anxiety Endocrine: No palpitations, diaphoresis, change in appetite, change in weigh or increased thirst Hematologic/Lymphatic:  No anemia, purpura, petechiae. Allergic/Immunologic: No itchy/runny eyes, nasal congestion, recent allergic reactions, rashes  ALLERGIES: Allergies  Allergen Reactions   Sulfamethoxazole Other (See Comments)    other    HOME MEDICATIONS:  Current Outpatient Medications:    amLODipine (NORVASC) 5 MG tablet, Take 1 tablet (5 mg total) by mouth daily., Disp: 90 tablet, Rfl: 3   aspirin EC 81 MG tablet, Take 81 mg by mouth daily., Disp: , Rfl:    DULoxetine (CYMBALTA) 60 MG capsule, Take 1 capsule (60 mg total) by mouth daily., Disp: 90 capsule, Rfl: 3   levocetirizine (XYZAL) 5 MG tablet, TAKE 1 TABLET BY MOUTH EVERY DAY IN THE EVENING, Disp: 90 tablet, Rfl: 3   metFORMIN (GLUCOPHAGE) 500 MG tablet, Take 1 tablet (500 mg total) by mouth daily with breakfast., Disp: 90 tablet, Rfl: 3   omeprazole (PRILOSEC) 20 MG capsule, Take 20 mg by mouth daily., Disp: , Rfl:    pregabalin (LYRICA) 200 MG capsule, Take 1 capsule (200 mg total) by mouth 2 (two) times daily., Disp: 180 capsule, Rfl: 3   tadalafil (CIALIS) 5 MG tablet, Take 5 mg by mouth daily., Disp: , Rfl:    valsartan (DIOVAN) 160 MG tablet, TAKE 1 TABLET BY MOUTH EVERY DAY, Disp: 90 tablet, Rfl: 0    Semaglutide-Weight Management (WEGOVY) 0.25 MG/0.5ML SOAJ, INJECT 0.25 MG INTO THE SKIN ONCE A WEEK FOR 28 DAYS (Patient not taking: Reported on 04/13/2023), Disp: 0.5 mL, Rfl: 3  PAST MEDICAL HISTORY: Past Medical History:  Diagnosis Date   Adenomatous polyp    Allergy    Atrial tachycardia    Diverticulosis    Dupuytren's disease    palms and soles   GERD (gastroesophageal reflux disease)    Hypertension     PAST SURGICAL HISTORY: Past Surgical History:  Procedure Laterality Date   BACK SURGERY     12/2013   CARDIAC CATHETERIZATION  1990's   COLECTOMY  2010   for diverticulitis   COLON SURGERY N/A    [employer] 08/27/2020   COLONOSCOPY  04/03/2013   LASIK  2002   Bil   POLYPECTOMY      FAMILY HISTORY: Family History  Problem Relation Age of Onset   Hypertension Mother    Macular degeneration Mother    Kidney disease Mother    Dementia Mother    Hypertension Father    Melanoma Sister    Stroke Other  uncle   Prostate cancer Other        uncle   Colon cancer Other        grandmother/uncle   Kidney disease Other        grandmother   Liver cancer Other        uncle (mets)   Heart disease Other        aunt/uncle   Lung cancer Other        uncle   Stomach cancer Cousin    Heart disease Cousin    Colon cancer Paternal Grandmother    Colon cancer Paternal Uncle    Prostate cancer Paternal Uncle    Arthritis Brother    Hypertension Brother    Esophageal cancer Neg Hx    Rectal cancer Neg Hx     SOCIAL HISTORY:  Social History   Socioeconomic History   Marital status: Married    Spouse name: Iris Mcquillan   Number of children: 3   Years of education: Not on file   Highest education level: Bachelor's degree (e.g., BA, AB, BS)  Occupational History   Occupation: Retired   Occupation: retired  Tobacco Use   Smoking status: Former    Types: Cigars    Quit date: 2015    Years since quitting: 9.6   Smokeless tobacco: Never   Tobacco comments:     Pt states smokes occasional cigar.  This is typically < 1 time per month  Vaping Use   Vaping status: Never Used  Substance and Sexual Activity   Alcohol use: No    Alcohol/week: 0.0 standard drinks of alcohol   Drug use: No   Sexual activity: Not on file  Other Topics Concern   Not on file  Social History Narrative   Pt lives in [place].     Retired from [employer] and [employer].     Goes on mission trips to [place], gets regular exercise.   Caffeine use: very rare, Diet coke   Right handed    Social Determinants of Health   Financial Resource Strain: Low Risk  (04/08/2023)   Overall Financial Resource Strain (CARDIA)    Difficulty of Paying Living Expenses: Not hard at all  Food Insecurity: No Food Insecurity (04/08/2023)   Hunger Vital Sign    Worried About Running Out of Food in the Last Year: Never true    Ran Out of Food in the Last Year: Never true  Transportation Needs: No Transportation Needs (04/08/2023)   PRAPARE - Administrator, Civil Service (Medical): No    Lack of Transportation (Non-Medical): No  Physical Activity: Patient Declined (04/08/2023)   Exercise Vital Sign    Days of Exercise per Week: Patient declined    Minutes of Exercise per Session: Patient declined  Recent Concern: Physical Activity - Insufficiently Active (01/30/2023)   Exercise Vital Sign    Days of Exercise per Week: 3 days    Minutes of Exercise per Session: 20 min  Stress: No Stress Concern Present (04/08/2023)   [employer] of Occupational Health - Occupational Stress Questionnaire    Feeling of Stress : Not at all  Social Connections: Unknown (04/08/2023)   Social Connection and Isolation Panel [NHANES]    Frequency of Communication with Friends and Family: More than three times a week    Frequency of Social Gatherings with Friends and Family: More than three times a week    Attends Religious Services: Patient declined    Active Member of  Clubs or Organizations: No     Attends Banker Meetings: Patient declined    Marital Status: Married  Catering manager Violence: Not At Risk (04/12/2023)   Humiliation, Afraid, Rape, and Kick questionnaire    Fear of Current or Ex-Partner: No    Emotionally Abused: No    Physically Abused: No    Sexually Abused: No     PHYSICAL EXAM  Vitals:   05/09/23 0938  BP: (!) 142/76  Pulse: 84  Weight: 238 lb 8 oz (108.2 kg)  Height: 5\' 9"  (1.753 m)    Body mass index is 35.22 kg/m.   General: The patient is well-developed and well-nourished and in no acute distress  HEENT:  Head is [place]/AT.  Sclera are anicteric.    Neck: No carotid bruits are noted.  The neck is nontender.  Cardiovascular: The heart has a regular rate and rhythm with a normal S1 and S2. There were no murmurs, gallops or rubs.    Skin: Extremities are without rash or  edema.  Musculoskeletal:  Back is nontender  Neurologic Exam  Mental status: The patient is alert and oriented x 3 at the time of the examination. The patient has apparent normal recent and remote memory, with an apparently normal attention span and concentration ability.   Speech is normal.  Cranial nerves: Extraocular movements are full.    There is good facial sensation to soft touch bilaterally.Facial strength is normal.  Trapezius and sternocleidomastoid strength is normal. No dysarthria is noted. No obvious hearing deficits are noted.  Motor:  Muscle bulk is normal.   Tone is normal. Strength is  5 / 5 in all 4 extremities.   Sensory: Sensory testing is intact to pinprick, soft touch and vibration sensation in arms.  About 50 % loss of vibration sensation in toes and just slight reduced at ankles .   Mild reduced pinprick to ankle.    Coordination: Cerebellar testing reveals good finger-nose-finger and heel-to-shin bilaterally.  Gait and station: Station is normal.   Gait is normal. Tandem gait is mildly wide. Romberg is negative.   Reflexes: Deep tendon  reflexes are symmetric and normal in arms, trace knees and absent at ankles.   Plantar responses are flexor.    DIAGNOSTIC DATA (LABS, IMAGING, TESTING) - I reviewed patient records, labs, notes, testing and imaging myself where available.  Lab Results  Component Value Date   WBC 8.8 01/09/2023   HGB 15.4 01/09/2023   HCT 46.7 01/09/2023   MCV 88.6 01/09/2023   PLT 274 01/09/2023      Component Value Date/Time   NA 142 01/09/2023 1235   NA 141 11/13/2022 1133   K 4.9 01/09/2023 1235   CL 105 01/09/2023 1235   CO2 29 01/09/2023 1235   GLUCOSE 89 01/09/2023 1235   BUN 20 01/09/2023 1235   BUN 14 11/13/2022 1133   CREATININE 1.09 01/09/2023 1235   CALCIUM 9.7 01/09/2023 1235   PROT 6.6 01/09/2023 1235   PROT 6.6 03/17/2021 0733   ALBUMIN 4.1 09/24/2022 1306   ALBUMIN 4.4 03/17/2021 0733   AST 15 01/09/2023 1235   ALT 14 01/09/2023 1235   ALKPHOS 49 09/24/2022 1306   BILITOT 0.4 01/09/2023 1235   BILITOT 0.3 03/17/2021 0733   GFRNONAA >60 09/24/2022 1306   GFRNONAA 68 02/24/2021 1128   GFRAA 79 02/24/2021 1128   Lab Results  Component Value Date   CHOL 145 03/30/2022   HDL 45 03/30/2022   LDLCALC 81  03/30/2022   TRIG 99 03/30/2022   CHOLHDL 3.2 03/30/2022   Lab Results  Component Value Date   HGBA1C 6.1 (H) 02/13/2023   Lab Results  Component Value Date   VITAMINB12 429 12/06/2020   Lab Results  Component Value Date   TSH 0.65 03/30/2022       ASSESSMENT AND PLAN  History of lacunar cerebrovascular accident  White matter abnormality on MRI of brain  Polyneuropathy - Plan: Vitamin B12, Multiple Myeloma Panel (SPEP&IFE w/QIG)  Dysesthesia   In summary, Mr. Schlotter is a 77 year old man who I had previously seen for polyneuropathy more than 3 years ago.  His polyneuropathy is stable and his dysesthesias are reasonably well-controlled on duloxetine and pregabalin.  He had a single fall backwards with brief loss of consciousness when he hit his head  and an MRI was performed.  It did show that he has more than typical white matter changes and a single lacunar infarction in the left anterior lentiform nucleus.  This could have been asymptomatic.  He also has a cavernoma in the left cerebellar hemisphere which would be expected to be asymptomatic.  He is currently on aspirin.  He is neurologically cleared to have GI procedures including colonoscopy and EGD.  He can go off of the aspirin before the procedure for a day or 2.  He can resume the aspirin when felt safe to do so by GI.  He will return to see [place] as needed if there are new or worsening neurologic symptoms.  Thank you for asking to see Mr. Montaque.  Please let me know if I can be of further assistance with him or other patients in the future.    Laura Radilla A. Epimenio Foot, MD, [facility] 05/09/2023, 10:27 AM Certified in Neurology, Clinical Neurophysiology, Sleep Medicine and Neuroimaging  [facility] [address] [place], [place] [address] [phone]

## 2023-05-10 ENCOUNTER — Other Ambulatory Visit: Payer: Self-pay

## 2023-05-10 ENCOUNTER — Telehealth: Payer: Self-pay

## 2023-05-10 NOTE — Telephone Encounter (Signed)
Thank you for clarifying. Yes I see that now in his chart. If he is really adamant about it we can do the colonoscopy, but it is okay to wait on that if he wanted to. If he wants to proceed with both, however, yes, can book for both. Thank you

## 2023-05-10 NOTE — Telephone Encounter (Signed)
Chart received & recommendations reviewed:   Robert Lente, MD  Emeline Darling, RN  [name] was seen today (Neuro)   He is neurologically cleared to have GI procedures including colonoscopy and EGD.  He can go off of the aspirin before the procedure for a day or 2.  He can resume the aspirin when felt safe to do so by GI.  -- Despina Arias, MD, PhD

## 2023-05-10 NOTE — Telephone Encounter (Signed)
Dr. Adela Lank, refer to office visit 04/13/2023. Neuro clearance received. Pls verify if you are ok with egd/colonoscopy at [facility]. THX

## 2023-05-10 NOTE — Telephone Encounter (Signed)
Okay to proceed with EGD at the [facility]. He does not need a colonoscopy (he is not due for it yet).  Okay to continue aspirin throughout, does not need to hold it. Thanks

## 2023-05-10 NOTE — Telephone Encounter (Signed)
Error

## 2023-05-11 ENCOUNTER — Other Ambulatory Visit: Payer: Self-pay

## 2023-05-11 DIAGNOSIS — K219 Gastro-esophageal reflux disease without esophagitis: Secondary | ICD-10-CM

## 2023-05-11 DIAGNOSIS — Z86010 Personal history of colonic polyps: Secondary | ICD-10-CM

## 2023-05-11 DIAGNOSIS — R1310 Dysphagia, unspecified: Secondary | ICD-10-CM

## 2023-05-11 MED ORDER — NA SULFATE-K SULFATE-MG SULF 17.5-3.13-1.6 GM/177ML PO SOLN: 1.0000 | Freq: Once | ORAL | 0 refills | Status: AC

## 2023-05-11 NOTE — Telephone Encounter (Signed)
Scheduled endo colon with patient for 06/19/23 at 3:00 pm in the [facility] with Dr. Adela Lank. Amb ref placed, prep sent to pharmacy, and instructions sent to patient. No blood thinners, only aspirin. He's aware that he does not need to hold this for procedure per MD. He verbalized all understanding & will call back with any further questions.

## 2023-05-12 ENCOUNTER — Other Ambulatory Visit: Payer: Self-pay | Admitting: Family Medicine

## 2023-05-12 DIAGNOSIS — I10 Essential (primary) hypertension: Secondary | ICD-10-CM

## 2023-05-13 LAB — MULTIPLE MYELOMA PANEL, SERUM
Albumin SerPl Elph-Mcnc: 3.6 g/dL (ref 2.9–4.4)
Albumin/Glob SerPl: 1.4 (ref 0.7–1.7)
Alpha 1: 0.2 g/dL (ref 0.0–0.4)
Alpha2 Glob SerPl Elph-Mcnc: 0.7 g/dL (ref 0.4–1.0)
B-Globulin SerPl Elph-Mcnc: 0.9 g/dL (ref 0.7–1.3)
Gamma Glob SerPl Elph-Mcnc: 0.8 g/dL (ref 0.4–1.8)
Globulin, Total: 2.6 g/dL (ref 2.2–3.9)
IgA/Immunoglobulin A, Serum: 155 mg/dL (ref 61–437)
IgG (Immunoglobin G), Serum: 828 mg/dL (ref 603–1613)
IgM (Immunoglobulin M), Srm: 29 mg/dL (ref 15–143)
Total Protein: 6.2 g/dL (ref 6.0–8.5)

## 2023-05-13 LAB — VITAMIN B12: Vitamin B-12: 331 pg/mL (ref 232–1245)

## 2023-05-14 NOTE — Telephone Encounter (Signed)
Requested medications are due for refill today.  yes  Requested medications are on the active medications list.  yes  Last refill. 02/05/2023 #90 0 rf  Future visit scheduled.   no  Notes to clinic.  Pt's last CPE 04/06/2022. Pt has been seen since then. Please advise.    Requested Prescriptions  Pending Prescriptions Disp Refills   valsartan (DIOVAN) 160 MG tablet [Pharmacy Med Name: VALSARTAN 160 MG TABLET] 90 tablet 0    Sig: TAKE 1 TABLET BY MOUTH EVERY DAY     Cardiovascular:  Angiotensin Receptor Blockers Failed - 05/12/2023  2:11 AM      Failed - Last BP in normal range    BP Readings from Last 1 Encounters:  05/09/23 (!) 142/76         Failed - Valid encounter within last 6 months    Recent Outpatient Visits           2 years ago Fever, unspecified fever cause   [facility] Tanya Nones, Priscille Heidelberg, MD   2 years ago Right sided sciatica   [facility] Donita Brooks, MD   2 years ago Skin tear of left hand without complication, initial encounter   [facility] Valentino Nose, NP   2 years ago Benign essential HTN   [facility] Tanya Nones, Priscille Heidelberg, MD   2 years ago Acute swimmer's ear of left side   [facility] [name], Priscille Heidelberg, MD       Future Appointments             In 1 month [place], Donnie Coffin, MD [facility]            Passed - Cr in normal range and within 180 days    Creat  Date Value Ref Range Status  01/09/2023 1.09 0.70 - 1.28 mg/dL Final         Passed - K in normal range and within 180 days    Potassium  Date Value Ref Range Status  01/09/2023 4.9 3.5 - 5.3 mmol/L Final         Passed - Patient is not pregnant

## 2023-05-15 ENCOUNTER — Encounter: Payer: Medicare Other | Admitting: Physical Medicine and Rehabilitation

## 2023-06-01 ENCOUNTER — Other Ambulatory Visit: Payer: Self-pay

## 2023-06-01 DIAGNOSIS — I10 Essential (primary) hypertension: Secondary | ICD-10-CM

## 2023-06-01 MED ORDER — VALSARTAN 160 MG PO TABS: 160.0000 mg | ORAL_TABLET | Freq: Every day | ORAL | 2 refills | Status: DC

## 2023-06-05 ENCOUNTER — Encounter: Payer: Self-pay | Admitting: Physical Medicine and Rehabilitation

## 2023-06-05 ENCOUNTER — Encounter: Payer: Self-pay | Admitting: Gastroenterology

## 2023-06-05 ENCOUNTER — Encounter
Payer: Medicare Other | Attending: Physical Medicine and Rehabilitation | Admitting: Physical Medicine and Rehabilitation

## 2023-06-05 VITALS — BP 142/84 | HR 69 | Ht 69.0 in | Wt 239.0 lb

## 2023-06-05 DIAGNOSIS — R252 Cramp and spasm: Secondary | ICD-10-CM | POA: Insufficient documentation

## 2023-06-05 DIAGNOSIS — G629 Polyneuropathy, unspecified: Secondary | ICD-10-CM | POA: Diagnosis not present

## 2023-06-05 DIAGNOSIS — M7989 Other specified soft tissue disorders: Secondary | ICD-10-CM | POA: Diagnosis not present

## 2023-06-05 DIAGNOSIS — R5383 Other fatigue: Secondary | ICD-10-CM | POA: Diagnosis present

## 2023-06-05 DIAGNOSIS — G4701 Insomnia due to medical condition: Secondary | ICD-10-CM | POA: Insufficient documentation

## 2023-06-05 NOTE — Patient Instructions (Addendum)
B vitamin with methylfolate- NOT FOLIC ACID

## 2023-06-05 NOTE — Progress Notes (Signed)
Subjective:    Patient ID: Robert Page, male    DOB: 31-Aug-1946, 77 y.o.   MRN: [id number]  HPI  1) Peripheral neuropathy: Male with pmh/psh of polyneuropathy, atrial tachycardia, back surgery (?laminectomy L4-5), left foot surgery (tumor removal x4), dupuytens (palms and soles), HTN, GERD presents for follow-up of bilateral neuropathy.  Initially stated: Started summer 2000.  He had foot surgery in left foot in ~2016. Progressing.  Radiating proximally.  No symptoms in hands.  Ambulating and activity improves the pain. Shoes and inactivity exacerbate the pain. Hot and tingling/burning.  He notes I am the 6th doctor he's been to. He saw PCP and Ortho, who did MRI and told non-surgical.  He was referred to Rheum, who did not believe it was RA and thought it was L4-5 radic.  He was referred to Neurology, who ordered NCV/EMG (showing polyneuropathy, left peroneal neuropathy, Left L5/S1 radic) and MRI.  MRI C-spine showing mild disc bulges C3-6. He had MRI L-spine.  He saw Neurosurg, who did Myelogram and was told symptoms not originating in back.  Constant.  Associated numbness.  Gabapentin, Lyrica, Cymbalta did not help. He was told he has tried "every single medication for neuropathy" without benefit. He notes improvement when he sleeps in a different bed.  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 Pain does not limit activities.   -he does not think that the qutenza patches helped  -lid leaked of the semglutide- he brought it to the pharamcy and they made half of it again for him, but it still leaked. He might have seen some results but was not sure if it was the medicine or the physical work he did outside this summer  -he is not sure if the medications are helping  -he asks if lyrica can be increased  -neuropathy has been stable  Since that time, patient states he has not tried heat. He states he  had burning with Capsacin for a few days but it does help for some time. He obtained Vitamin levels. Sleep has improved.  Patient states he was losing weight, but gained in on vacation. Patient states he often does not have time to take care of his body, take meds consistently, etc.   -He is taking Lyrica and Cymbalta. He has tightness and stiffness in both lower extremities. He had a dose of steroid dose pack that did provide some relief to his back pain. If he is barefoot in the shower he feels off balance. He saw Dr. Victorino Page at [facility]. He has had prior foot surgery- was told he had arthritis. He saw rheumatology and was told he does not have rheumatoid arthritis. He had EMG/NCS (results above). -he asks for refill of Lyrica and Cymbalta   Both feet are really stiff -he did not get enough benefit enough from Qutenza.  Discussed trying longer duration to 35 minutes today since he did not have any burning with prior treatment and he is agreeable to this Did not require ice packs during treatment today, but toward end of treatment developed burning on dorsum of right foot Pain can be very severe at night, this is when pain continues to be worse Needs medication refills today It negatively affects his sleep He feels burning pain worst on tops of both feet but also present in soles He also has pain radiating from his back into his  right shin He is ok on his medicines- no refills needed right now He is not sure if Lyrica or Cymbalta are helping. He will let us know when he needs refills Neuropathy has been stable, he feels stiff and numbness in his feet He does not want to take things that are not needed -His wife has been diagnosed with lewy Body dementia -she was started on Trazodone.  -he does not use a heating pad -he has never tried metformin  2) Obesity: -patient notes Reginal Lutes was denied and he asks about alternative options -he felt less hungry with the semalglutide.  -he is back  up to 239 lbs  Pain Inventory Average Pain 5 Pain Right Now 5 My pain is burning at times, burning  In the last 24 hours, has pain interfered with the following? General activity 4 Relation with others 3 Enjoyment of life 7 What TIME of day is your pain at its worst?  evening Sleep (in general) Fair  Pain is worse with: sitting, standing, some activities Pain improves with: rest Relief from Meds: 3   Family History  Problem Relation Age of Onset   Hypertension Mother    Macular degeneration Mother    Kidney disease Mother    Dementia Mother    Hypertension Father    Melanoma Sister    Stroke Other        uncle   Prostate cancer Other        uncle   Colon cancer Other        grandmother/uncle   Kidney disease Other        grandmother   Liver cancer Other        uncle (mets)   Heart disease Other        aunt/uncle   Lung cancer Other        uncle   Stomach cancer Cousin    Heart disease Cousin    Colon cancer Paternal Grandmother    Colon cancer Paternal Uncle    Prostate cancer Paternal Uncle    Arthritis Brother    Hypertension Brother    Esophageal cancer Neg Hx    Rectal cancer Neg Hx    Social History   Socioeconomic History   Marital status: Married    Spouse name: Robert Page   Number of children: 3   Years of education: Not on file   Highest education level: Bachelor's degree (e.g., BA, AB, BS)  Occupational History   Occupation: Retired   Occupation: retired  Tobacco Use   Smoking status: Former    Types: Cigars    Quit date: 2015    Years since quitting: 9.7   Smokeless tobacco: Never   Tobacco comments:    Pt states smokes occasional cigar.  This is typically < 1 time per month  Vaping Use   Vaping status: Never Used  Substance and Sexual Activity   Alcohol use: No    Alcohol/week: 0.0 standard drinks of alcohol   Drug use: No   Sexual activity: Not on file  Other Topics Concern   Not on file  Social History Narrative   Pt  lives in [place].     Retired from [employer] and [employer].     Goes on mission trips to [place], gets regular exercise.   Caffeine use: very rare, Diet coke   Right handed    Social Determinants of Health   Financial Resource Strain: Low Risk  (04/08/2023)   Overall Physicist, medical Strain (  CARDIA)    Difficulty of Paying Living Expenses: Not hard at all  Food Insecurity: No Food Insecurity (04/08/2023)   Hunger Vital Sign    Worried About Running Out of Food in the Last Year: Never true    Ran Out of Food in the Last Year: Never true  Transportation Needs: No Transportation Needs (04/08/2023)   PRAPARE - Administrator, Civil Service (Medical): No    Lack of Transportation (Non-Medical): No  Physical Activity: Patient Declined (04/08/2023)   Exercise Vital Sign    Days of Exercise per Week: Patient declined    Minutes of Exercise per Session: Patient declined  Recent Concern: Physical Activity - Insufficiently Active (01/30/2023)   Exercise Vital Sign    Days of Exercise per Week: 3 days    Minutes of Exercise per Session: 20 min  Stress: No Stress Concern Present (04/08/2023)   [employer] of Occupational Health - Occupational Stress Questionnaire    Feeling of Stress : Not at all  Social Connections: Unknown (04/08/2023)   Social Connection and Isolation Panel [NHANES]    Frequency of Communication with Friends and Family: More than three times a week    Frequency of Social Gatherings with Friends and Family: More than three times a week    Attends Religious Services: Patient declined    Active Member of Clubs or Organizations: No    Attends Engineer, structural: Patient declined    Marital Status: Married   Past Surgical History:  Procedure Laterality Date   BACK SURGERY     12/2013   CARDIAC CATHETERIZATION  1990's   COLECTOMY  2010   for diverticulitis   COLON SURGERY N/A    [employer] 08/27/2020   COLONOSCOPY  04/03/2013   LASIK  2002   Bil    POLYPECTOMY     Past Medical History:  Diagnosis Date   Adenomatous polyp    Allergy    Atrial tachycardia (HCC)    Diverticulosis    Dupuytren's disease    palms and soles   GERD (gastroesophageal reflux disease)    Hypertension    Ht 5\' 9"  (1.753 m)   Wt 239 lb (108.4 kg)   BMI 35.29 kg/m   Opioid Risk Score:   Fall Risk Score:  `1  Depression screen [facility] 2/9     06/05/2023    9:13 AM 04/12/2023    1:00 PM 02/13/2023   10:46 AM 01/09/2023    2:51 PM 11/13/2022   10:38 AM 09/15/2022   11:59 AM 08/14/2022    1:37 PM  Depression screen PHQ 2/9  Decreased Interest 0 0 0 0 0 0 0  Down, Depressed, Hopeless 0 0 0 0 0 0 0  PHQ - 2 Score 0 0 0 0 0 0 0   Review of Systems  Constitutional: Negative.   HENT: Negative.    Eyes: Negative.   Respiratory: Negative.    Cardiovascular: Negative.   Gastrointestinal: Negative.   Endocrine: Negative.   Genitourinary: Negative.   Musculoskeletal:  Positive for gait problem.       Rt upper leg B/L lower leg  Skin: Negative.   Allergic/Immunologic: Negative.   Hematological: Negative.   Psychiatric/Behavioral: Negative.        Objective:   Physical Exam  Gen: no distress, normal appearing, weight 239 lbs, BMI 35.29, BP 142/84 HEENT: oral mucosa pink and moist, NCAT Cardio: Reg rate Chest: normal effort, normal rate of breathing Abd: soft, non-distended Ext:  no edema Psych: pleasant, normal affect Skin: intact Neuro: Alert and oriented x3    Assessment & Plan:  Male with pmh/psh ofpolyneuropathy, atrial tachycardia, back surgery (?laminectomy L4-5), left foot surgery (tumor removal x4), dupuytens (palms and soles), HTN, GERD presents for f/u b/l feet pain.  1. Chronic bilateral feet pain -     MRI foot report unremarkable.  MRI back report showing ? L5-S1 radic  Recommended whole food rich diet.   NCS/EMG - showing polyneuropathy and L5-SI radic on left, results reviewed.   No benefit with Gabapentin  Trial Heat, reminded  again  Will consider PT  Discussing purchasing TENS unit from [employer]  Trial OTC Lidocaine patch  Increase Lyrica to 200mg  BID  Will consider referral to Neurosurg, pt would like to hold off at present  Will consider ESI, pt would like to hold off at present  Will consider PNS  Retry Capsacin, educated again  Continue Cymbalta  Continue lyrica   -will not repeat Qutenza given lack of benefit and cost  Prescribed metformin 500mg  daily prn  Patient states main goal is to be pain free - improving  Educated on importance of caring for self. -Discussed current symptoms of pain and history of pain.  -Discussed benefits of exercise in reducing pain. -Discussed following foods that may reduce pain: 1) Ginger (especially studied for arthritis)- reduce leukotriene production to decrease inflammation 2) Blueberries- high in phytonutrients that decrease inflammation 3) Salmon- marine omega-3s reduce joint swelling and pain 4) Pumpkin seeds- reduce inflammation 5) dark chocolate- reduces inflammation 6) turmeric- reduces inflammation 7) tart cherries - reduce pain and stiffness 8) extra virgin olive oil - its compound olecanthal helps to block prostaglandins  9) chili peppers- can be eaten or applied topically via capsaicin 10) mint- helpful for headache, muscle aches, joint pain, and itching 11) garlic- reduces inflammation  Link to further information on diet for chronic pain: http://www.bray.[email or web address]/    2. Sleep disturbance  Discontinue amitriptyline  Discussed that Qutenza has been particularly shown to help with painful neuropathy at night  Continue lyrica to 200mg  BID -Try to go outside near sunrise -Get exercise during the day.  -Turn off all devices an hour before bedtime.  -Teas that can benefit: chamomile, valerian root, Brahmi (Bacopa) -Can consider over the counter melatonin, magnesium, and/or L-theanine.  Melatonin is an anti-oxidant with multiple health benefits. Magnesium is involved in greater than 300 enzymatic reactions in the body and most of [place] are deficient as our soil is often depleted. There are 7 different types of magnesium- Bioptemizer's is a supplement with all 7 types, and each has unique benefits. Magnesium can also help with constipation and anxiety.  -Pistachios naturally increase the production of melatonin -Cozy Earth bamboo bed sheets are free from toxic chemicals.  -Tart cherry juice or a tart cherry supplement can improve sleep and soreness post-workout   Continue tylenol PM  3. Obesity, BMI 34.18, weight is 239 lbs  Discussed weight is stable from last visit  Discussed that Johney Maine was denied  Discussed risks and benefits of Contrave, Qysimia, and compounded semglutide and patient chose latter  Not interested in seeing dietitian  Educated again  Discussed following criteria for [facility]: 1) Patient has diagnosis of obesity; 2) Patient must be 41 years of age or older; 3) The patient has been involved in a physician or dietitian monitored weight loss program, consisting of both low-calorie diet, increased physical activity and behavioral counseling for a minimum of 6 months without a  3% loss from baseline; 4) Patients BMI is one of the following: a) 30kg/m or greater; b) 27 29.99 kg/m in the presence of at least one weight related comorbidity such as coronary heart disease, dyslipidemia, hypertension, symptomatic arthritis of the lower extremities, type 2 diabetes mellitus, obstructive sleep apnea; c) 25-29.9 kg/m2 and waist circumference is &gt; 40 inches for males or &gt; 35 inches for females;5) Not Met - Must have tried and failed at least one preferred oral agent such as Phentermine; 6) Patient has no labeled contraindication; 7) Patient is not taking another weight loss medication; 8) The dose is within approved product labeling guidelines; 10) The patient must not be taking  another GLP-1 receptor agonist AND the patient must not be taking insulin concurrently.   -prescribed ZOXWRU -recommended resistance training and prioritizing protein intake to avoid muscle loss while taking this medication -discussed risks of thyroid cancer and gastroparesis   4. Myalgia  D/c robaxin  Lipid panel reviewed and normal  5. Prediabetes: -HgbA1c reviewed and is 5.9 -metformin 500mg  daily prn ordered.  -discussed diet  -discussed that he does not use a lot of sugar  6) HTN: -check BMP and magnesium level -discussed trying to minimize stress from his wife's diagnosis -discussed that he has a lot of support from his two loving daughters and son  7) leg swelling:  -recommended frequent rest breaks when driving  8) History of CVA: -recommended methylated B vitamin -continue green beans and garden peas  9) Fatigue:  -recommended avoiding visceral fat in food -discussed checking testosterone level -discussed vitamin D is probably fine

## 2023-06-06 LAB — TESTOSTERONE: Testosterone: 329 ng/dL (ref 264–916)

## 2023-06-07 ENCOUNTER — Encounter (HOSPITAL_BASED_OUTPATIENT_CLINIC_OR_DEPARTMENT_OTHER): Payer: Medicare Other | Admitting: Physical Medicine and Rehabilitation

## 2023-06-07 DIAGNOSIS — R252 Cramp and spasm: Secondary | ICD-10-CM

## 2023-06-09 NOTE — Progress Notes (Signed)
Subjective:    Patient ID: Robert Page, male    DOB: 01-Jan-1946, 77 y.o.   MRN: [id number]  HPI  1) Peripheral neuropathy: Male with pmh/psh of polyneuropathy, atrial tachycardia, back surgery (?laminectomy L4-5), left foot surgery (tumor removal x4), dupuytens (palms and soles), HTN, GERD presents for follow-up of bilateral neuropathy.  Initially stated: Started summer 2000.  He had foot surgery in left foot in ~2016. Progressing.  Radiating proximally.  No symptoms in hands.  Ambulating and activity improves the pain. Shoes and inactivity exacerbate the pain. Hot and tingling/burning.  He notes I am the 6th doctor he's been to. He saw PCP and Ortho, who did MRI and told non-surgical.  He was referred to Rheum, who did not believe it was RA and thought it was L4-5 radic.  He was referred to Neurology, who ordered NCV/EMG (showing polyneuropathy, left peroneal neuropathy, Left L5/S1 radic) and MRI.  MRI C-spine showing mild disc bulges C3-6. He had MRI L-spine.  He saw Neurosurg, who did Myelogram and was told symptoms not originating in back.  Constant.  Associated numbness.  Gabapentin, Lyrica, Cymbalta did not help. He was told he has tried "every single medication for neuropathy" without benefit. He notes improvement when he sleeps in a different bed.  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 Pain does not limit activities.   -he does not take methylfolate  -he does not think that the qutenza patches helped  -lid leaked of the semglutide- he brought it to the pharamcy and they made half of it again for him, but it still leaked. He might have seen some results but was not sure if it was the medicine or the physical work he did outside this summer  -he is not sure if the medications are helping  -he asks if lyrica can be increased  -neuropathy has been stable  Since that time, patient states he  has not tried heat. He states he had burning with Capsacin for a few days but it does help for some time. He obtained Vitamin levels. Sleep has improved.  Patient states he was losing weight, but gained in on vacation. Patient states he often does not have time to take care of his body, take meds consistently, etc.   -He is taking Lyrica and Cymbalta. He has tightness and stiffness in both lower extremities. He had a dose of steroid dose pack that did provide some relief to his back pain. If he is barefoot in the shower he feels off balance. He saw Dr. Victorino Dike at [facility]. He has had prior foot surgery- was told he had arthritis. He saw rheumatology and was told he does not have rheumatoid arthritis. He had EMG/NCS (results above). -he asks for refill of Lyrica and Cymbalta   Both feet are really stiff -he did not get enough benefit enough from Qutenza.  Discussed trying longer duration to 35 minutes today since he did not have any burning with prior treatment and he is agreeable to this Did not require ice packs during treatment today, but toward end of treatment developed burning on dorsum of right foot Pain can be very severe at night, this is when pain continues to be worse Needs medication refills today It negatively affects his sleep He feels burning pain worst on tops of both feet but also present in soles He also has pain  radiating from his back into his right shin He is ok on his medicines- no refills needed right now He is not sure if Lyrica or Cymbalta are helping. He will let us know when he needs refills Neuropathy has been stable, he feels stiff and numbness in his feet He does not want to take things that are not needed -His wife has been diagnosed with lewy Body dementia -she was started on Trazodone.  -he does not use a heating pad -he has never tried metformin  2) Obesity: -patient notes Reginal Lutes was denied and he asks about alternative options -he felt less hungry  with the semalglutide.  -he is back up to 239 lbs  3) Muscle cramps: - he has been getting muscle cramps at night  Pain Inventory Average Pain 5 Pain Right Now 5 My pain is burning at times, burning  In the last 24 hours, has pain interfered with the following? General activity 4 Relation with others 3 Enjoyment of life 7 What TIME of day is your pain at its worst?  evening Sleep (in general) Fair  Pain is worse with: sitting, standing, some activities Pain improves with: rest Relief from Meds: 3   Family History  Problem Relation Age of Onset   Hypertension Mother    Macular degeneration Mother    Kidney disease Mother    Dementia Mother    Hypertension Father    Melanoma Sister    Stroke Other        uncle   Prostate cancer Other        uncle   Colon cancer Other        grandmother/uncle   Kidney disease Other        grandmother   Liver cancer Other        uncle (mets)   Heart disease Other        aunt/uncle   Lung cancer Other        uncle   Stomach cancer Cousin    Heart disease Cousin    Colon cancer Paternal Grandmother    Colon cancer Paternal Uncle    Prostate cancer Paternal Uncle    Arthritis Brother    Hypertension Brother    Esophageal cancer Neg Hx    Rectal cancer Neg Hx    Social History   Socioeconomic History   Marital status: Married    Spouse name: Zalman Hull   Number of children: 3   Years of education: Not on file   Highest education level: Bachelor's degree (e.g., BA, AB, BS)  Occupational History   Occupation: Retired   Occupation: retired  Tobacco Use   Smoking status: Former    Types: Cigars    Quit date: 2015    Years since quitting: 9.7   Smokeless tobacco: Never   Tobacco comments:    Pt states smokes occasional cigar.  This is typically < 1 time per month  Vaping Use   Vaping status: Never Used  Substance and Sexual Activity   Alcohol use: No    Alcohol/week: 0.0 standard drinks of alcohol   Drug use: No    Sexual activity: Not on file  Other Topics Concern   Not on file  Social History Narrative   Pt lives in [place].     Retired from [employer] and [employer].     Goes on mission trips to [place], gets regular exercise.   Caffeine use: very rare, Diet coke   Right handed  Social Determinants of Health   Financial Resource Strain: Low Risk  (04/08/2023)   Overall Financial Resource Strain (CARDIA)    Difficulty of Paying Living Expenses: Not hard at all  Food Insecurity: No Food Insecurity (04/08/2023)   Hunger Vital Sign    Worried About Running Out of Food in the Last Year: Never true    Ran Out of Food in the Last Year: Never true  Transportation Needs: No Transportation Needs (04/08/2023)   PRAPARE - Administrator, Civil Service (Medical): No    Lack of Transportation (Non-Medical): No  Physical Activity: Patient Declined (04/08/2023)   Exercise Vital Sign    Days of Exercise per Week: Patient declined    Minutes of Exercise per Session: Patient declined  Recent Concern: Physical Activity - Insufficiently Active (01/30/2023)   Exercise Vital Sign    Days of Exercise per Week: 3 days    Minutes of Exercise per Session: 20 min  Stress: No Stress Concern Present (04/08/2023)   [employer] of Occupational Health - Occupational Stress Questionnaire    Feeling of Stress : Not at all  Social Connections: Unknown (04/08/2023)   Social Connection and Isolation Panel [NHANES]    Frequency of Communication with Friends and Family: More than three times a week    Frequency of Social Gatherings with Friends and Family: More than three times a week    Attends Religious Services: Patient declined    Database administrator or Organizations: No    Attends Engineer, structural: Patient declined    Marital Status: Married   Past Surgical History:  Procedure Laterality Date   BACK SURGERY     12/2013   CARDIAC CATHETERIZATION  1990's   COLECTOMY  2010   for  diverticulitis   COLON SURGERY N/A    [facility] 08/27/2020   COLONOSCOPY  04/03/2013   LASIK  2002   Bil   POLYPECTOMY     Past Medical History:  Diagnosis Date   Adenomatous polyp    Allergy    Atrial tachycardia (HCC)    Diverticulosis    Dupuytren's disease    palms and soles   GERD (gastroesophageal reflux disease)    Hypertension    There were no vitals taken for this visit.  Opioid Risk Score:   Fall Risk Score:  `1  Depression screen [facility] 2/9     06/05/2023    9:13 AM 04/12/2023    1:00 PM 02/13/2023   10:46 AM 01/09/2023    2:51 PM 11/13/2022   10:38 AM 09/15/2022   11:59 AM 08/14/2022    1:37 PM  Depression screen PHQ 2/9  Decreased Interest 0 0 0 0 0 0 0  Down, Depressed, Hopeless 0 0 0 0 0 0 0  PHQ - 2 Score 0 0 0 0 0 0 0   Review of Systems  Constitutional: Negative.   HENT: Negative.    Eyes: Negative.   Respiratory: Negative.    Cardiovascular: Negative.   Gastrointestinal: Negative.   Endocrine: Negative.   Genitourinary: Negative.   Musculoskeletal:  Positive for gait problem.       Rt upper leg B/L lower leg  Skin: Negative.   Allergic/Immunologic: Negative.   Hematological: Negative.   Psychiatric/Behavioral: Negative.        Objective:   Physical Exam  Gen: no distress, normal appearing, weight 239 lbs, BMI 35.29, BP 142/84 HEENT: oral mucosa pink and moist, NCAT Cardio: Reg rate Chest: normal  effort, normal rate of breathing Abd: soft, non-distended Ext: no edema Psych: pleasant, normal affect Skin: intact Neuro: Alert and oriented x3    Assessment & Plan:  Male with pmh/psh ofpolyneuropathy, atrial tachycardia, back surgery (?laminectomy L4-5), left foot surgery (tumor removal x4), dupuytens (palms and soles), HTN, GERD presents for f/u b/l feet pain.  1. Chronic bilateral feet pain -     MRI foot report unremarkable.  MRI back report showing ? L5-S1 radic  Recommended whole food rich diet.   Recommended B vitamin complex with  methylfolate, discussed this can also help to prevent heart disease  NCS/EMG - showing polyneuropathy and L5-SI radic on left, results reviewed.   No benefit with Gabapentin  Trial Heat, reminded again  Will consider PT  Discussing purchasing TENS unit from [employer]  Trial OTC Lidocaine patch  Increase Lyrica to 200mg  BID  Will consider referral to Neurosurg, pt would like to hold off at present  Will consider ESI, pt would like to hold off at present  Will consider PNS  Retry Capsacin, educated again  Continue Cymbalta  Continue lyrica   -will not repeat Qutenza given lack of benefit and cost  Prescribed metformin 500mg  daily prn  Patient states main goal is to be pain free - improving  Educated on importance of caring for self. -Discussed current symptoms of pain and history of pain.  -Discussed benefits of exercise in reducing pain. -Discussed following foods that may reduce pain: 1) Ginger (especially studied for arthritis)- reduce leukotriene production to decrease inflammation 2) Blueberries- high in phytonutrients that decrease inflammation 3) Salmon- marine omega-3s reduce joint swelling and pain 4) Pumpkin seeds- reduce inflammation 5) dark chocolate- reduces inflammation 6) turmeric- reduces inflammation 7) tart cherries - reduce pain and stiffness 8) extra virgin olive oil - its compound olecanthal helps to block prostaglandins  9) chili peppers- can be eaten or applied topically via capsaicin 10) mint- helpful for headache, muscle aches, joint pain, and itching 11) garlic- reduces inflammation  Link to further information on diet for chronic pain: [email or web address].bray.[email or web address]    2. Sleep disturbance  Discontinue amitriptyline  Discussed that Qutenza has been particularly shown to help with painful neuropathy at night  Continue lyrica to 200mg  BID -Try to go outside near sunrise -Get exercise during the  day.  -Turn off all devices an hour before bedtime.  -Teas that can benefit: chamomile, valerian root, Brahmi (Bacopa) -Can consider over the counter melatonin, magnesium, and/or L-theanine. Melatonin is an anti-oxidant with multiple health benefits. Magnesium is involved in greater than 300 enzymatic reactions in the body and most of [place] are deficient as our soil is often depleted. There are 7 different types of magnesium- Bioptemizer's is a supplement with all 7 types, and each has unique benefits. Magnesium can also help with constipation and anxiety.  -Pistachios naturally increase the production of melatonin -Cozy Earth bamboo bed sheets are free from toxic chemicals.  -Tart cherry juice or a tart cherry supplement can improve sleep and soreness post-workout   Continue tylenol PM  3. Obesity, BMI 34.18, weight is 239 lbs  Discussed weight is stable from last visit  Discussed that Johney Maine was denied  Discussed risks and benefits of Contrave, Qysimia, and compounded semglutide and patient chose latter  Not interested in seeing dietitian  Educated again  Discussed following criteria for [facility]: 1) Patient has diagnosis of obesity; 2) Patient must be 9 years of age or older; 3) The patient has been involved in a physician  or dietitian monitored weight loss program, consisting of both low-calorie diet, increased physical activity and behavioral counseling for a minimum of 6 months without a 3% loss from baseline; 4) Patients BMI is one of the following: a) 30kg/m or greater; b) 27 29.99 kg/m in the presence of at least one weight related comorbidity such as coronary heart disease, dyslipidemia, hypertension, symptomatic arthritis of the lower extremities, type 2 diabetes mellitus, obstructive sleep apnea; c) 25-29.9 kg/m2 and waist circumference is &gt; 40 inches for males or &gt; 35 inches for females;5) Not Met - Must have tried and failed at least one preferred oral agent such as Phentermine; 6)  Patient has no labeled contraindication; 7) Patient is not taking another weight loss medication; 8) The dose is within approved product labeling guidelines; 10) The patient must not be taking another GLP-1 receptor agonist AND the patient must not be taking insulin concurrently.   -prescribed GMWNUU -recommended resistance training and prioritizing protein intake to avoid muscle loss while taking this medication -discussed risks of thyroid cancer and gastroparesis   4. Myalgia  D/c robaxin  Lipid panel reviewed and normal  5. Prediabetes: -HgbA1c reviewed and is 5.9 -metformin 500mg  daily prn ordered.  -discussed diet  -discussed that he does not use a lot of sugar  6) HTN: -check BMP and magnesium level -discussed trying to minimize stress from his wife's diagnosis -discussed that he has a lot of support from his two loving daughters and son  7) leg swelling:  -recommended frequent rest breaks when driving  8) History of CVA: -recommended methylated B vitamin -continue green beans and garden peas  9) Fatigue:  -recommended avoiding visceral fat in food -discussed checking testosterone level -discussed vitamin D is probably fine   10) muscle cramps: -recommended magnesium glycinate 250mg  HS, discussed this can also help with sleep.   10 minutes spent in discussion of trial of methylated B vitamin for his neuropathy and heart disease prevention, recommended magnesium glycinate 250mg  HS to help with muscle cramps, discussed this can also help with sleep

## 2023-06-19 ENCOUNTER — Encounter: Payer: Self-pay | Admitting: Gastroenterology

## 2023-06-19 ENCOUNTER — Ambulatory Visit: Payer: Medicare Other | Admitting: Gastroenterology

## 2023-06-19 VITALS — BP 169/90 | HR 55 | Temp 98.0°F | Resp 14 | Ht 69.0 in | Wt 238.0 lb

## 2023-06-19 DIAGNOSIS — D122 Benign neoplasm of ascending colon: Secondary | ICD-10-CM

## 2023-06-19 DIAGNOSIS — Z09 Encounter for follow-up examination after completed treatment for conditions other than malignant neoplasm: Secondary | ICD-10-CM | POA: Diagnosis not present

## 2023-06-19 DIAGNOSIS — R1310 Dysphagia, unspecified: Secondary | ICD-10-CM

## 2023-06-19 DIAGNOSIS — K317 Polyp of stomach and duodenum: Secondary | ICD-10-CM | POA: Diagnosis not present

## 2023-06-19 DIAGNOSIS — D125 Benign neoplasm of sigmoid colon: Secondary | ICD-10-CM

## 2023-06-19 DIAGNOSIS — Z860100 Personal history of colon polyps, unspecified: Secondary | ICD-10-CM

## 2023-06-19 DIAGNOSIS — K635 Polyp of colon: Secondary | ICD-10-CM

## 2023-06-19 DIAGNOSIS — Z860101 Personal history of adenomatous and serrated colon polyps: Secondary | ICD-10-CM | POA: Diagnosis not present

## 2023-06-19 MED ORDER — SODIUM CHLORIDE 0.9 % IV SOLN: 500.0000 mL | Freq: Once | INTRAVENOUS | Status: DC

## 2023-06-19 NOTE — Progress Notes (Signed)
[facility] History and Physical   Primary Care Physician:  Donita Brooks, MD   Reason for Procedure:   Dysphagia, history of colon polyps  Plan:    EGD and colonoscopy     HPI: Robert Page is a 77 y.o. male  here for EGD and colonoscopy to evaluate issues as outlined. History of adenomas removed 2019. He also has been having some dysphagia. NO prior EGD. On omeprazole.   . Patient denies any bowel symptoms at this time. No family history of colon cancer known. Otherwise feels well without any cardiopulmonary symptoms.   I have discussed risks / benefits of anesthesia and endoscopic procedure with Virgel Gess Kerner and they wish to proceed with the exams as outlined today.    Past Medical History:  Diagnosis Date   Adenomatous polyp    Allergy    Arthritis    Atrial tachycardia (HCC)    Diverticulosis    Dupuytren's disease    palms and soles   GERD (gastroesophageal reflux disease)    Hypertension    Stroke [facility])     Past Surgical History:  Procedure Laterality Date   BACK SURGERY     12/2013   CARDIAC CATHETERIZATION  1990's   COLECTOMY  2010   for diverticulitis   COLON SURGERY N/A    [facility] 08/27/2020   COLONOSCOPY  04/03/2013   LASIK  2002   Bil   POLYPECTOMY      Prior to Admission medications   Medication Sig Start Date End Date Taking? Authorizing Provider  amLODipine (NORVASC) 5 MG tablet Take 1 tablet (5 mg total) by mouth daily. 01/22/23  Yes Donita Brooks, MD  aspirin EC 81 MG tablet Take 81 mg by mouth daily.   Yes [provider]  DULoxetine (CYMBALTA) 60 MG capsule Take 1 capsule (60 mg total) by mouth daily. 05/23/22  Yes Raulkar, Drema Pry, MD  metFORMIN (GLUCOPHAGE) 500 MG tablet Take 1 tablet (500 mg total) by mouth daily with breakfast. 11/13/22  Yes Raulkar, Drema Pry, MD  omeprazole (PRILOSEC) 20 MG capsule Take 20 mg by mouth daily.   Yes [provider]  pregabalin (LYRICA) 200 MG capsule Take 1  capsule (200 mg total) by mouth 2 (two) times daily. 02/13/23  Yes Raulkar, Drema Pry, MD  tadalafil (CIALIS) 5 MG tablet Take 5 mg by mouth daily. 08/11/22  Yes [provider]  valsartan (DIOVAN) 160 MG tablet Take 1 tablet (160 mg total) by mouth daily. 06/01/23  Yes Donita Brooks, MD  levocetirizine (XYZAL) 5 MG tablet TAKE 1 TABLET BY MOUTH EVERY DAY IN THE EVENING 05/01/23   Donita Brooks, MD    Current Outpatient Medications  Medication Sig Dispense Refill   amLODipine (NORVASC) 5 MG tablet Take 1 tablet (5 mg total) by mouth daily. 90 tablet 3   aspirin EC 81 MG tablet Take 81 mg by mouth daily.     DULoxetine (CYMBALTA) 60 MG capsule Take 1 capsule (60 mg total) by mouth daily. 90 capsule 3   metFORMIN (GLUCOPHAGE) 500 MG tablet Take 1 tablet (500 mg total) by mouth daily with breakfast. 90 tablet 3   omeprazole (PRILOSEC) 20 MG capsule Take 20 mg by mouth daily.     pregabalin (LYRICA) 200 MG capsule Take 1 capsule (200 mg total) by mouth 2 (two) times daily. 180 capsule 3   tadalafil (CIALIS) 5 MG tablet Take 5 mg by mouth daily.     valsartan (  DIOVAN) 160 MG tablet Take 1 tablet (160 mg total) by mouth daily. 90 tablet 2   levocetirizine (XYZAL) 5 MG tablet TAKE 1 TABLET BY MOUTH EVERY DAY IN THE EVENING 90 tablet 3   Current Facility-Administered Medications  Medication Dose Route Frequency Provider Last Rate Last Admin   0.9 %  sodium chloride infusion  500 mL Intravenous Once Anilah Huck, Willaim Rayas, MD        Allergies as of 06/19/2023 - Review Complete 06/19/2023  Allergen Reaction Noted   Sulfamethoxazole Other (See Comments) 10/18/2015    Family History  Problem Relation Age of Onset   Hypertension Mother    Macular degeneration Mother    Kidney disease Mother    Dementia Mother    Hypertension Father    Melanoma Sister    Stroke Other        uncle   Prostate cancer Other        uncle   Colon cancer Other        grandmother/uncle   Kidney disease  Other        grandmother   Liver cancer Other        uncle (mets)   Heart disease Other        aunt/uncle   Lung cancer Other        uncle   Stomach cancer Cousin    Heart disease Cousin    Colon cancer Paternal Grandmother    Colon cancer Paternal Uncle    Prostate cancer Paternal Uncle    Arthritis Brother    Hypertension Brother    Esophageal cancer Neg Hx    Rectal cancer Neg Hx     Social History   Socioeconomic History   Marital status: Married    Spouse name: Khalil Szczepanik   Number of children: 3   Years of education: Not on file   Highest education level: Bachelor's degree (e.g., BA, AB, BS)  Occupational History   Occupation: Retired   Occupation: retired  Tobacco Use   Smoking status: Former    Types: Cigars    Quit date: 2015    Years since quitting: 9.7   Smokeless tobacco: Never   Tobacco comments:    Pt states smokes occasional cigar.  This is typically < 1 time per month  Vaping Use   Vaping status: Never Used  Substance and Sexual Activity   Alcohol use: No    Alcohol/week: 0.0 standard drinks of alcohol   Drug use: No   Sexual activity: Not on file  Other Topics Concern   Not on file  Social History Narrative   Pt lives in [place].     Retired from [employer] and [employer].     Goes on mission trips to [place], gets regular exercise.   Caffeine use: very rare, Diet coke   Right handed    Social Determinants of Health   Financial Resource Strain: Low Risk  (04/08/2023)   Overall Financial Resource Strain (CARDIA)    Difficulty of Paying Living Expenses: Not hard at all  Food Insecurity: No Food Insecurity (04/08/2023)   Hunger Vital Sign    Worried About Running Out of Food in the Last Year: Never true    Ran Out of Food in the Last Year: Never true  Transportation Needs: No Transportation Needs (04/08/2023)   PRAPARE - Administrator, Civil Service (Medical): No    Lack of Transportation (Non-Medical): No  Physical Activity:  Patient Declined (04/08/2023)  Exercise Vital Sign    Days of Exercise per Week: Patient declined    Minutes of Exercise per Session: Patient declined  Recent Concern: Physical Activity - Insufficiently Active (01/30/2023)   Exercise Vital Sign    Days of Exercise per Week: 3 days    Minutes of Exercise per Session: 20 min  Stress: No Stress Concern Present (04/08/2023)   [employer] of Occupational Health - Occupational Stress Questionnaire    Feeling of Stress : Not at all  Social Connections: Unknown (04/08/2023)   Social Connection and Isolation Panel [NHANES]    Frequency of Communication with Friends and Family: More than three times a week    Frequency of Social Gatherings with Friends and Family: More than three times a week    Attends Religious Services: Patient declined    Database administrator or Organizations: No    Attends Banker Meetings: Patient declined    Marital Status: Married  Catering manager Violence: Not At Risk (04/12/2023)   Humiliation, Afraid, Rape, and Kick questionnaire    Fear of Current or Ex-Partner: No    Emotionally Abused: No    Physically Abused: No    Sexually Abused: No    Review of Systems: All other review of systems negative except as mentioned in the HPI.  Physical Exam: Vital signs BP (!) 155/68   Pulse 62   Temp 98 F (36.7 C) (Temporal)   Ht 5\' 9"  (1.753 m)   Wt 238 lb (108 kg)   SpO2 93%   BMI 35.15 kg/m   General:   Alert,  Well-developed, pleasant and cooperative in NAD Lungs:  Clear throughout to auscultation.   Heart:  Regular rate and rhythm Abdomen:  Soft, nontender and nondistended.   Neuro/Psych:  Alert and cooperative. Normal mood and affect. A and O x 3  Harlin Rain, MD [facility]

## 2023-06-19 NOTE — Progress Notes (Signed)
Sedate, gd SR, tolerated procedure well, VSS, report to RN 

## 2023-06-19 NOTE — Progress Notes (Signed)
Called to room to assist during endoscopic procedure.  Patient ID and intended procedure confirmed with present staff. Received instructions for my participation in the procedure from the performing physician.  

## 2023-06-19 NOTE — Op Note (Signed)
[facility] Patient Name: Robert Page Procedure Date: 06/19/2023 3:23 PM MRN: [id number] Endoscopist: [name] P. Adela Lank , MD, [id number] Age: 77 Referring MD:  Date of Birth: 1945-10-14 Gender: Male Account #: [id number] Procedure:                Upper GI endoscopy Indications:              Dysphagia Medicines:                Monitored Anesthesia Care Procedure:                Pre-Anesthesia Assessment:                           - Prior to the procedure, a History and Physical                            was performed, and patient medications and                            allergies were reviewed. The patient's tolerance of                            previous anesthesia was also reviewed. The risks                            and benefits of the procedure and the sedation                            options and risks were discussed with the patient.                            All questions were answered, and informed consent                            was obtained. Prior Anticoagulants: The patient has                            taken no anticoagulant or antiplatelet agents. ASA                            Grade Assessment: II - A patient with mild systemic                            disease. After reviewing the risks and benefits,                            the patient was deemed in satisfactory condition to                            undergo the procedure.                           After obtaining informed consent, the endoscope was  passed under direct vision. Throughout the                            procedure, the patient's blood pressure, pulse, and                            oxygen saturations were monitored continuously. The                            GIF [id number] #[id number] was introduced through the                            mouth, and advanced to the second part of duodenum.                            The upper GI endoscopy was accomplished  without                            difficulty. The patient tolerated the procedure                            well. Scope In: Scope Out: Findings:                 Esophagogastric landmarks were identified: the                            Z-line was found at 38 cm, the gastroesophageal                            junction was found at 38 cm and the upper extent of                            the gastric folds was found at 39 cm from the                            incisors.                           A 1 cm hiatal hernia was present.                           The Z-line was slightly irregular but did not meet                            criteria for Barrett's.                           The exam of the esophagus was otherwise normal.                           A guidewire was placed and the scope was withdrawn.                            Empiric dilation was  performed in the entire                            esophagus with a Savary dilator. Initially this was                            attempted with 17mm dilator however there was                            resistance in the posterior pharynx with this and                            was removed. Subsequently [facility] dilator was placed                            and passed at 15mm and 16mm with mild resistance.                            Relook endoscopy showed no mucosal wrents.                           Multiple small sessile polyps were found in the                            gastric fundus and in the gastric body. Suspect                            fundic gland polyps. Representative biopsies were                            taken with a cold forceps for histology to rule out                            adenomatous changes.                           The exam of the stomach was otherwise normal.                           The duodenal bulb and second portion of the                            duodenum were normal. Complications:            No  immediate complications. Estimated blood loss:                            Minimal. Estimated Blood Loss:     Estimated blood loss was minimal. Impression:               - Esophagogastric landmarks identified.                           - 1 cm hiatal hernia.                           -  Z-line slightly irregular but did not meet                            criteria for Barrett's.                           - Normal esophagus otherwise - no focal stenosis /                            stricture. Empirically dilated as outlined above.                           - Multiple gastric polyps. Biopsied.                           - Normal stomach otherwise.                           - Normal duodenal bulb and second portion of the                            duodenum. Recommendation:           - Patient has a contact number available for                            emergencies. The signs and symptoms of potential                            delayed complications were discussed with the                            patient. Return to normal activities tomorrow.                            Written discharge instructions were provided to the                            patient.                           - Resume previous diet.                           - Continue present medications.                           - Await pathology results and course post dilation. [name] P. Othello Dickenson, MD 06/19/2023 4:18:41 PM This report has been signed electronically.

## 2023-06-19 NOTE — Op Note (Signed)
[facility] Patient Name: Robert Page Procedure Date: 06/19/2023 3:11 PM MRN: [id number] Endoscopist: [name] P. Adela Lank , MD, [id number] Age: 77 Referring MD:  Date of Birth: February 03, 1946 Gender: Male Account #: [id number] Procedure:                Colonoscopy Indications:              High risk colon cancer surveillance: Personal                            history of colonic polyps - adenoma removed 05/2018 Medicines:                Monitored Anesthesia Care Procedure:                Pre-Anesthesia Assessment:                           - Prior to the procedure, a History and Physical                            was performed, and patient medications and                            allergies were reviewed. The patient's tolerance of                            previous anesthesia was also reviewed. The risks                            and benefits of the procedure and the sedation                            options and risks were discussed with the patient.                            All questions were answered, and informed consent                            was obtained. Prior Anticoagulants: The patient has                            taken no anticoagulant or antiplatelet agents. ASA                            Grade Assessment: II - A patient with mild systemic                            disease. After reviewing the risks and benefits,                            the patient was deemed in satisfactory condition to                            undergo the procedure.  After obtaining informed consent, the colonoscope                            was passed under direct vision. Throughout the                            procedure, the patient's blood pressure, pulse, and                            oxygen saturations were monitored continuously. The                            CF [id number] #[id number] was introduced through the anus                            and  advanced to the the cecum, identified by                            appendiceal orifice and ileocecal valve. The                            colonoscopy was performed without difficulty. The                            patient tolerated the procedure well. The quality                            of the bowel preparation was adequate. The                            ileocecal valve, appendiceal orifice, and rectum                            were photographed. Scope In: 3:55:15 PM Scope Out: 4:14:21 PM Scope Withdrawal Time: 0 hours 17 minutes 34 seconds  Total Procedure Duration: 0 hours 19 minutes 6 seconds  Findings:                 The perianal and digital rectal examinations were                            normal.                           Multiple medium-mouthed diverticula were found in                            the entire colon.                           There was evidence of a prior end-to-end                            colo-colonic anastomosis in the distal sigmoid  colon. This was patent and was characterized by                            healthy appearing mucosa.                           Two flat and sessile polyps were found in the                            ascending colon. The polyps were 4 to 5 mm in size.                            These polyps were removed with a cold snare.                            Resection and retrieval were complete.                           A 3 to 4 mm polyp was found in the sigmoid colon.                            The polyp was sessile. The polyp was removed with a                            cold snare. Resection and retrieval were complete.                           Internal hemorrhoids were found during retroflexion.                           The exam was otherwise without abnormality. Complications:            No immediate complications. Estimated blood loss:                            Minimal. Estimated Blood Loss:      Estimated blood loss was minimal. Impression:               - Diverticulosis in the entire examined colon.                           - Patent end-to-end colo-colonic anastomosis,                            characterized by healthy appearing mucosa.                           - Two 4 to 5 mm polyps in the ascending colon,                            removed with a cold snare. Resected and retrieved.                           - One 3 to 4 mm polyp  in the sigmoid colon, removed                            with a cold snare. Resected and retrieved.                           - Internal hemorrhoids.                           - The examination was otherwise normal. Recommendation:           - Patient has a contact number available for                            emergencies. The signs and symptoms of potential                            delayed complications were discussed with the                            patient. Return to normal activities tomorrow.                            Written discharge instructions were provided to the                            patient.                           - Resume previous diet.                           - Continue present medications.                           - Await pathology results. [name] P. Adela Lank, MD 06/19/2023 4:24:21 PM This report has been signed electronically.

## 2023-06-19 NOTE — Patient Instructions (Signed)
Please read handouts provided. Resume previous diet. Await pathology results. Continue present medications.   YOU HAD AN ENDOSCOPIC PROCEDURE TODAY AT THE [facility]:   Refer to the procedure report that was given to you for any specific questions about what was found during the examination.  If the procedure report does not answer your questions, please call your gastroenterologist to clarify.  If you requested that your care partner not be given the details of your procedure findings, then the procedure report has been included in a sealed envelope for you to review at your convenience later.  YOU SHOULD EXPECT: Some feelings of bloating in the abdomen. Passage of more gas than usual.  Walking can help get rid of the air that was put into your GI tract during the procedure and reduce the bloating. If you had a lower endoscopy (such as a colonoscopy or flexible sigmoidoscopy) you may notice spotting of blood in your stool or on the toilet paper. If you underwent a bowel prep for your procedure, you may not have a normal bowel movement for a few days.  Please Note:  You might notice some irritation and congestion in your nose or some drainage.  This is from the oxygen used during your procedure.  There is no need for concern and it should clear up in a day or so.  SYMPTOMS TO REPORT IMMEDIATELY:  Following lower endoscopy (colonoscopy or flexible sigmoidoscopy):  Excessive amounts of blood in the stool  Significant tenderness or worsening of abdominal pains  Swelling of the abdomen that is new, acute  Fever of 100F or higher  Following upper endoscopy (EGD)  Vomiting of blood or coffee ground material  New chest pain or pain under the shoulder blades  Painful or persistently difficult swallowing  New shortness of breath  Fever of 100F or higher  Black, tarry-looking stools  For urgent or emergent issues, a gastroenterologist can be reached at any hour by calling [phone]  [phone]. Do not use MyChart messaging for urgent concerns.    DIET:  We do recommend a small meal at first, but then you may proceed to your regular diet.  Drink plenty of fluids but you should avoid alcoholic beverages for 24 hours.  ACTIVITY:  You should plan to take it easy for the rest of today and you should NOT DRIVE or use heavy machinery until tomorrow (because of the sedation medicines used during the test).    FOLLOW UP: Our staff will call the number listed on your records the next business day following your procedure.  We will call around 7:15- 8:00 am to check on you and address any questions or concerns that you may have regarding the information given to you following your procedure. If we do not reach you, we will leave a message.     If any biopsies were taken you will be contacted by phone or by letter within the next 1-3 weeks.  Please call us at [phone] if you have not heard about the biopsies in 3 weeks.    SIGNATURES/CONFIDENTIALITY: You and/or your care partner have signed paperwork which will be entered into your electronic medical record.  These signatures attest to the fact that that the information above on your After Visit Summary has been reviewed and is understood.  Full responsibility of the confidentiality of this discharge information lies with you and/or your care-partner.

## 2023-06-20 ENCOUNTER — Telehealth: Payer: Self-pay

## 2023-06-20 NOTE — Telephone Encounter (Signed)
Left message on answering machine. 

## 2023-06-22 LAB — SURGICAL PATHOLOGY

## 2023-06-25 ENCOUNTER — Other Ambulatory Visit: Payer: Self-pay | Admitting: Physical Medicine and Rehabilitation

## 2023-06-25 MED ORDER — PREGABALIN 200 MG PO CAPS: 200.0000 mg | ORAL_CAPSULE | Freq: Two times a day (BID) | ORAL | 3 refills | Status: DC

## 2023-06-26 ENCOUNTER — Ambulatory Visit: Payer: Medicare Other | Admitting: Interventional Cardiology

## 2023-06-26 ENCOUNTER — Encounter: Payer: Self-pay | Admitting: Physician Assistant

## 2023-06-26 ENCOUNTER — Other Ambulatory Visit: Payer: Self-pay | Admitting: *Deleted

## 2023-06-26 ENCOUNTER — Ambulatory Visit: Payer: Medicare Other | Attending: Interventional Cardiology | Admitting: Physician Assistant

## 2023-06-26 VITALS — BP 130/72 | HR 64 | Ht 69.5 in | Wt 236.0 lb

## 2023-06-26 DIAGNOSIS — E7800 Pure hypercholesterolemia, unspecified: Secondary | ICD-10-CM | POA: Diagnosis not present

## 2023-06-26 DIAGNOSIS — I471 Supraventricular tachycardia, unspecified: Secondary | ICD-10-CM

## 2023-06-26 DIAGNOSIS — I10 Essential (primary) hypertension: Secondary | ICD-10-CM

## 2023-06-26 DIAGNOSIS — R0609 Other forms of dyspnea: Secondary | ICD-10-CM

## 2023-06-26 DIAGNOSIS — I2510 Atherosclerotic heart disease of native coronary artery without angina pectoris: Secondary | ICD-10-CM | POA: Diagnosis not present

## 2023-06-26 HISTORY — DX: Atherosclerotic heart disease of native coronary artery without angina pectoris: I25.10

## 2023-06-26 NOTE — Assessment & Plan Note (Signed)
Quiescent. -Continue current management.

## 2023-06-26 NOTE — Progress Notes (Addendum)
Cardiology Office Note:    Date:  06/26/2023  ID:  Robert Page, DOB 1946-03-12, MRN [id number] PCP: Donita Brooks, MD  [facility] Providers Cardiologist:  Lance Muss, MD       Patient Profile:      Supraventricular Tachycardia - likely Atrial Tachycardia (saw EP in 10/2012, Dr. Johney Frame)  TTE 12/07/17: mild LVH, EF 60-65, no RWMA, ascending aorta 37 mm, trivial MR, NL RVSF, mild TR, PASP 25  Chest CT 12/25/17: no TAA (max 3.5 cm) Coronary artery Ca2+ (CT in 12/2017) Myoview 10/29/12: EF 57, no ischemia  Hypertension  Hyperlipidemia  Hx of CVA  Carotid US 11/27/16: Bilat ICA < 50%         History of Present Illness:  Discussed the use of AI scribe software for clinical note transcription with the patient, who gave verbal consent to proceed.  Robert Page is a 77 y.o. male who returns for follow up of CAD, SVT. He was last seen by Dr. Eldridge Dace in 04/2022. He complained of shortness of breath and Myocardial PET was ordered. However, this was never done. He is here alone. He continues to have shortness of breath with exertion, which he perceives as worsening since last year. The patient denies any chest discomfort but mentions frequent indigestion, which is managed with daily Prilosec and occasional Tums. He has a history of neuropathy, which has led to balance issues and frequent falls, approximately once a month. He recently experienced a significant fall, resulting in a leg injury that caused pain and limited mobility for two days. He has not had syncope, orthopnea, leg edema.      Review of Systems  Gastrointestinal:  Negative for hematochezia and melena.  Genitourinary:  Negative for hematuria.  See HPI     Studies Reviewed:   EKG Interpretation Date/Time:  Tuesday June 26 2023 14:55:04 EDT Ventricular Rate:  67 PR Interval:  182 QRS Duration:  94 QT Interval:  382 QTC Calculation: 403 R Axis:   61  Text Interpretation: Normal sinus rhythm Non-specific  ST-t changes No significant change since last tracing Confirmed by Tereso Newcomer [phone]) on 06/26/2023 5:42:35 PM    Results   LABS - Chart Review Potassium: 4.9 (01/09/2023) Creatinine: 1.09 (01/09/2023) ALT: 14 (01/09/2023) LDL: 81 (03/30/2022)      Risk Assessment/Calculations:             Physical Exam:   VS:  BP 130/72   Pulse 64   Ht 5' 9.5" (1.765 m)   Wt 236 lb (107 kg)   SpO2 95%   BMI 34.35 kg/m    Wt Readings from Last 3 Encounters:  06/26/23 236 lb (107 kg)  06/19/23 238 lb (108 kg)  06/05/23 239 lb (108.4 kg)    Constitutional:      Appearance: Healthy appearance. Not in distress.  Neck:     Vascular: JVD normal.  Pulmonary:     Breath sounds: Normal breath sounds. No wheezing. No rales.  Cardiovascular:     Normal rate. Regular rhythm.     Murmurs: There is no murmur.  Edema:    Peripheral edema absent.  Abdominal:     Palpations: Abdomen is soft.      Assessment and Plan:   Assessment & Plan Coronary artery calcification seen on CT scan History of coronary artery calcification on CT scan in 2019. He notes shortness of breath with exertion. This could be an anginal equivalent. No stress test performed last year as  planned. -Schedule myocardial PET scan to evaluate for ischemia. -Schedule echocardiogram to evaluate for structural heart disease. SVT (supraventricular tachycardia) (HCC) Quiescent. -Continue current management.  Pure hypercholesterolemia Last LDL was 81 in July 2023. Patient has concerns about taking rosuvastatin due to potential side effects. -Order fasting lipids. If LDL above 70, consider starting pravastatin or Zetia.  Essential hypertension Blood pressure well controlled on current regimen. -Continue valsartan 160mg  daily    Informed Consent   Shared Decision Making/Informed Consent The risks [chest pain, shortness of breath, cardiac arrhythmias, dizziness, blood pressure fluctuations, myocardial infarction, stroke/transient  ischemic attack, nausea, vomiting, allergic reaction, radiation exposure, metallic taste sensation and life-threatening complications (estimated to be 1 in 10,000)], benefits (risk stratification, diagnosing coronary artery disease, treatment guidance) and alternatives of a cardiac PET stress test were discussed in detail with Mr. Modzelewski and he agrees to proceed.     Dispo:  Return in about 1 year (around 06/25/2024) for Routine Follow Up, w/ Dr. Lynnette Caffey, or Tereso Newcomer, PA-C.  Signed, Tereso Newcomer, PA-C

## 2023-06-26 NOTE — Assessment & Plan Note (Signed)
History of coronary artery calcification on CT scan in 2019. He notes shortness of breath with exertion. This could be an anginal equivalent. No stress test performed last year as planned. -Schedule myocardial PET scan to evaluate for ischemia. -Schedule echocardiogram to evaluate for structural heart disease.

## 2023-06-26 NOTE — Patient Instructions (Addendum)
Medication Instructions:  Your physician recommends that you continue on your current medications as directed. Please refer to the Current Medication list given to you today.  *If you need a refill on your cardiac medications before your next appointment, please call your pharmacy*   Lab Work: WHEN YOU COME FOR THE ECHOCARDIOGRAM, COME FASTING FOR:  LIPID  If you have labs (blood work) drawn today and your tests are completely normal, you will receive your results only by: MyChart Message (if you have MyChart) OR A paper copy in the mail If you have any lab test that is abnormal or we need to change your treatment, we will call you to review the results.   Testing/Procedures: Your physician has requested that you have an echocardiogram. Echocardiography is a painless test that uses sound waves to create images of your heart. It provides your doctor with information about the size and shape of your heart and how well your heart's chambers and valves are working. This procedure takes approximately one hour. There are no restrictions for this procedure. Please do NOT wear cologne, perfume, aftershave, or lotions (deodorant is allowed). Please arrive 15 minutes prior to your appointment time.  Your Physician recommends you have a NM Caridac PET Scan:  How to Prepare for Your Cardiac PET/CT Stress Test:  1. Please do not take these medications before your test:   Medications that may interfere with the cardiac pharmacological stress agent (ex. nitrates - including erectile dysfunction medications, isosorbide mononitrate, tamulosin or beta-blockers) the day of the exam. (Erectile dysfunction medication should be held for at least 72 hrs prior to test) Theophylline containing medications for 12 hours. Dipyridamole 48 hours prior to the test. Your remaining medications may be taken with water.  2. Nothing to eat or drink, except water, 3 hours prior to arrival time.   NO  caffeine/decaffeinated products, or chocolate 12 hours prior to arrival.  3. NO perfume, cologne or lotion on chest or abdomen area.     4. Total time is 1 to 2 hours; you may want to bring reading material for the waiting time.  5. Please report to Radiology at the [facility] Main Entrance 30 minutes early for your test.  [address] [place], [place] [address]  6. Please report to Radiology at [facility] Main Entrance, medical mall, 30 mins prior to your test.  [address]  [place], [place]  [phone]  Diabetic Preparation:  Hold oral medications METFORMIN THE MORNING OF. You may take NPH and Lantus insulin. Do not take Humalog or Humulin R (Regular Insulin) the day of your test. Check blood sugars prior to leaving the house. If able to eat breakfast prior to 3 hour fasting, you may take all medications, including your insulin, Do not worry if you miss your breakfast dose of insulin - start at your next meal. Patients who wear a continuous glucose monitor MUST remove the device prior to scanning.  IF YOU THINK YOU MAY BE PREGNANT, OR ARE NURSING PLEASE INFORM THE TECHNOLOGIST.  In preparation for your appointment, medication and supplies will be purchased.  Appointment availability is limited, so if you need to cancel or reschedule, please call the Radiology Department at [phone] [facility]) OR [phone] [facility])  24 hours in advance to avoid a cancellation fee of $100.00  What to Expect After you Arrive:  Once you arrive and check in for your appointment, you will be taken to a preparation room within  the Radiology Department.  A technologist or Nurse will obtain your medical history, verify that you are correctly prepped for the exam, and explain the procedure.  Afterwards,  an IV will be started in your arm and electrodes will be placed on your skin for EKG monitoring during the stress portion of the exam. Then you will be  escorted to the PET/CT scanner.  There, staff will get you positioned on the scanner and obtain a blood pressure and EKG.  During the exam, you will continue to be connected to the EKG and blood pressure machines.  A small, safe amount of a radioactive tracer will be injected in your IV to obtain a series of pictures of your heart along with an injection of a stress agent.    After your Exam:  It is recommended that you eat a meal and drink a caffeinated beverage to counter act any effects of the stress agent.  Drink plenty of fluids for the remainder of the day and urinate frequently for the first couple of hours after the exam.  Your doctor will inform you of your test results within 7-10 business days.  For more information and frequently asked questions, please visit our website : [email or web address]  For questions about your test or how to prepare for your test, please call: Cardiac Imaging Nurse Navigators Office: [phone]    Follow-Up: At [facility], you and your health needs are our priority.  As part of our continuing mission to provide you with exceptional heart care, we have created designated Provider Care Teams.  These Care Teams include your primary Cardiologist (physician) and Advanced Practice Providers (APPs -  Physician Assistants and Nurse Practitioners) who all work together to provide you with the care you need, when you need it.  We recommend signing up for the patient portal called "MyChart".  Sign up information is provided on this After Visit Summary.  MyChart is used to connect with patients for Virtual Visits (Telemedicine).  Patients are able to view lab/test results, encounter notes, upcoming appointments, etc.  Non-urgent messages can be sent to your provider as well.   To learn more about what you can do with MyChart, go to [email or web address].    Your next appointment:   12 month(s)  Provider:   Alverda Skeans, or Tereso Newcomer,  PA-C         Other Instructions

## 2023-06-26 NOTE — Assessment & Plan Note (Addendum)
Blood pressure well controlled on current regimen. -Continue valsartan 160mg  daily

## 2023-06-26 NOTE — Assessment & Plan Note (Addendum)
Blood pressure well controlled on current regimen. -Continue valsartan 160mg  d

## 2023-07-16 LAB — LIPID PANEL
Chol/HDL Ratio: 4.1 ratio (ref 0.0–5.0)
Cholesterol, Total: 189 mg/dL (ref 100–199)
HDL: 46 mg/dL (ref >39)
LDL Chol Calc (NIH): 126 mg/dL — ABNORMAL HIGH (ref 0–99)
Triglycerides: 94 mg/dL (ref 0–149)
VLDL Cholesterol Cal: 17 mg/dL (ref 5–40)

## 2023-07-17 ENCOUNTER — Telehealth: Payer: Self-pay | Admitting: *Deleted

## 2023-07-17 ENCOUNTER — Other Ambulatory Visit: Payer: Self-pay | Admitting: *Deleted

## 2023-07-17 DIAGNOSIS — E785 Hyperlipidemia, unspecified: Secondary | ICD-10-CM

## 2023-07-17 MED ORDER — EZETIMIBE 10 MG PO TABS: 10.0000 mg | ORAL_TABLET | Freq: Every day | ORAL | 3 refills | Status: AC

## 2023-07-17 NOTE — Telephone Encounter (Signed)
Patient notified.  Prescription sent to [facility] on [address].  He will stop by office around Feb 12 for fasting lab work

## 2023-07-17 NOTE — Telephone Encounter (Signed)
-----   Message from Tereso Newcomer sent at 07/17/2023 10:08 AM EST ----- Result note sent to [employer] via MyChart. See comments below. PLAN:  -Start Zetia 10 mg once daily  -Fasting Lipids, LFTs in 3 mos  Robert Page  Your LDL cholesterol is above goal. It should be less than 70. I will start you on Ezetimibe (Zetia) 10 mg daily. This is not a statin medication. I will recheck your cholesterol in 3 mos.  Tereso Newcomer, PA-C    07/17/2023 10:07 AM

## 2023-07-25 ENCOUNTER — Ambulatory Visit (HOSPITAL_COMMUNITY): Payer: Medicare Other | Attending: Physician Assistant

## 2023-07-25 DIAGNOSIS — R0609 Other forms of dyspnea: Secondary | ICD-10-CM | POA: Diagnosis present

## 2023-07-25 DIAGNOSIS — E7800 Pure hypercholesterolemia, unspecified: Secondary | ICD-10-CM | POA: Insufficient documentation

## 2023-07-25 DIAGNOSIS — I2510 Atherosclerotic heart disease of native coronary artery without angina pectoris: Secondary | ICD-10-CM | POA: Diagnosis present

## 2023-07-25 DIAGNOSIS — I471 Supraventricular tachycardia, unspecified: Secondary | ICD-10-CM | POA: Insufficient documentation

## 2023-07-25 DIAGNOSIS — I10 Essential (primary) hypertension: Secondary | ICD-10-CM | POA: Diagnosis present

## 2023-07-25 LAB — ECHOCARDIOGRAM COMPLETE
AR max vel: 2.77 cm2
AV Area VTI: 2.76 cm2
AV Area mean vel: 2.64 cm2
AV Mean grad: 6.5 mm[Hg]
AV Peak grad: 12.2 mm[Hg]
Ao pk vel: 1.75 m/s
Area-P 1/2: 3.17 cm2
S' Lateral: 3.1 cm

## 2023-07-27 ENCOUNTER — Encounter (HOSPITAL_COMMUNITY): Payer: Self-pay

## 2023-07-31 ENCOUNTER — Encounter (HOSPITAL_COMMUNITY)
Admission: RE | Admit: 2023-07-31 | Discharge: 2023-07-31 | Disposition: A | Discharge status: Home / Self Care | Payer: Medicare Other | Source: Ambulatory Visit | Attending: Physician Assistant | Admitting: Physician Assistant

## 2023-07-31 DIAGNOSIS — I10 Essential (primary) hypertension: Secondary | ICD-10-CM | POA: Diagnosis present

## 2023-07-31 DIAGNOSIS — I2510 Atherosclerotic heart disease of native coronary artery without angina pectoris: Secondary | ICD-10-CM | POA: Diagnosis not present

## 2023-07-31 DIAGNOSIS — R0609 Other forms of dyspnea: Secondary | ICD-10-CM | POA: Diagnosis not present

## 2023-07-31 DIAGNOSIS — E7800 Pure hypercholesterolemia, unspecified: Secondary | ICD-10-CM | POA: Diagnosis present

## 2023-07-31 DIAGNOSIS — I471 Supraventricular tachycardia, unspecified: Secondary | ICD-10-CM | POA: Insufficient documentation

## 2023-07-31 LAB — NM PET CT CARDIAC PERFUSION MULTI W/ABSOLUTE BLOODFLOW
LV dias vol: 113 mL (ref 62–150)
MBFR: 2.41
Nuc Rest EF: 58 %
Nuc Stress EF: 68 %
Peak HR: 91 {beats}/min
Rest HR: 73 {beats}/min
Rest MBF: 0.76 ml/g/min
Rest Nuclear Isotope Dose: 28 mCi
Rest perfusion cavity size (mL): 113 mL
ST Depression (mm): 0 mm
Stress MBF: 1.83 ml/g/min
Stress Nuclear Isotope Dose: 28 mCi
Stress perfusion cavity size (mL): 122 mL

## 2023-07-31 MED ORDER — RUBIDIUM RB82 GENERATOR (RUBYFILL): 28.0000 | PACK | Freq: Once | INTRAVENOUS | Status: DC

## 2023-07-31 MED ORDER — REGADENOSON 0.4 MG/5ML IV SOLN
INTRAVENOUS | Status: AC
  Filled 2023-07-31: qty 5

## 2023-07-31 MED ORDER — REGADENOSON 0.4 MG/5ML IV SOLN
0.4000 mg | Freq: Once | INTRAVENOUS | Status: AC
  Administered 2023-07-31: 0.4 mg via INTRAVENOUS

## 2023-08-05 ENCOUNTER — Other Ambulatory Visit: Payer: Self-pay | Admitting: Physical Medicine and Rehabilitation

## 2023-09-05 ENCOUNTER — Other Ambulatory Visit: Payer: Self-pay | Admitting: Physical Medicine and Rehabilitation

## 2023-09-18 ENCOUNTER — Ambulatory Visit: Payer: Medicare Other | Admitting: Family Medicine

## 2023-09-18 VITALS — BP 138/82 | HR 68 | Temp 97.9°F | Ht 69.5 in | Wt 241.8 lb

## 2023-09-18 DIAGNOSIS — M5432 Sciatica, left side: Secondary | ICD-10-CM

## 2023-09-18 DIAGNOSIS — M436 Torticollis: Secondary | ICD-10-CM | POA: Diagnosis not present

## 2023-09-18 MED ORDER — PREDNISONE 20 MG PO TABS: ORAL_TABLET | ORAL | 0 refills | Status: DC

## 2023-09-18 NOTE — Progress Notes (Signed)
 Subjective:    Patient ID: Robert Page, male    DOB: December 16, 1945, 78 y.o.   MRN: [id number]   Past Medical History:  Diagnosis Date   Adenomatous polyp    Allergy     Arthritis    Atrial tachycardia (HCC)    Coronary artery calcification seen on CT scan 06/26/2023   Coronary artery Ca2+ (CT in 12/2017) Myoview 10/29/12: EF 57, no ischemia     Diverticulosis    Dupuytren's disease    palms and soles   GERD (gastroesophageal reflux disease)    Hypertension    Stroke [facility])    Past Surgical History:  Procedure Laterality Date   BACK SURGERY     12/2013   CARDIAC CATHETERIZATION  1990's   COLECTOMY  2010   for diverticulitis   COLON SURGERY N/A    [facility] 08/27/2020   COLONOSCOPY  04/03/2013   LASIK  2002   [facility]   POLYPECTOMY     Current Outpatient Medications on File Prior to Visit  Medication Sig Dispense Refill   metFORMIN  (GLUCOPHAGE ) 500 MG tablet TAKE 1 TABLET BY MOUTH EVERY DAY WITH BREAKFAST 90 tablet 3   acetaminophen  (TYLENOL ) 500 MG tablet Take 500 mg by mouth as needed for mild pain (pain score 1-3) (neuropathy).     amLODipine  (NORVASC ) 5 MG tablet Take 1 tablet (5 mg total) by mouth daily. 90 tablet 3   aspirin EC 81 MG tablet Take 81 mg by mouth daily.     DULoxetine  (CYMBALTA ) 60 MG capsule TAKE 1 CAPSULE BY MOUTH EVERY DAY 90 capsule 3   ezetimibe  (ZETIA ) 10 MG tablet Take 1 tablet (10 mg total) by mouth daily. 90 tablet 3   levocetirizine (XYZAL ) 5 MG tablet TAKE 1 TABLET BY MOUTH EVERY DAY IN THE EVENING 90 tablet 3   omeprazole (PRILOSEC) 20 MG capsule Take 20 mg by mouth daily.     pregabalin  (LYRICA ) 200 MG capsule Take 1 capsule (200 mg total) by mouth 2 (two) times daily. 180 capsule 3   tadalafil (CIALIS) 5 MG tablet Take 5 mg by mouth daily.     valsartan  (DIOVAN ) 160 MG tablet Take 1 tablet (160 mg total) by mouth daily. 90 tablet 2   No current facility-administered medications on file prior to visit.   Allergies  Allergen Reactions    Sulfamethoxazole Other (See Comments)    other   Social History   Socioeconomic History   Marital status: Married    Spouse name: Stoney Karczewski   Number of children: 3   Years of education: Not on file   Highest education level: Bachelor's degree (e.g., BA, AB, BS)  Occupational History   Occupation: Retired   Occupation: retired  Tobacco Use   Smoking status: Former    Types: Cigars    Quit date: 2015    Years since quitting: 10.0   Smokeless tobacco: Never   Tobacco comments:    Pt states smokes occasional cigar.  This is typically < 1 time per month  Vaping Use   Vaping status: Never Used  Substance and Sexual Activity   Alcohol use: No    Alcohol/week: 0.0 standard drinks of alcohol   Drug use: No   Sexual activity: Not on file  Other Topics Concern   Not on file  Social History Narrative   Pt lives in [place].     Retired from [employer] and [employer].     Goes on mission trips to [place], gets  regular exercise.   Caffeine use: very rare, Diet coke   Right handed    Social Drivers of Health   Financial Resource Strain: Low Risk  (09/17/2023)   Overall Financial Resource Strain (CARDIA)    Difficulty of Paying Living Expenses: Not hard at all  Food Insecurity: No Food Insecurity (09/17/2023)   Hunger Vital Sign    Worried About Running Out of Food in the Last Year: Never true    Ran Out of Food in the Last Year: Never true  Transportation Needs: No Transportation Needs (09/17/2023)   PRAPARE - Administrator, Civil Service (Medical): No    Lack of Transportation (Non-Medical): No  Physical Activity: Patient Declined (04/08/2023)   Exercise Vital Sign    Days of Exercise per Week: Patient declined    Minutes of Exercise per Session: Patient declined  Recent Concern: Physical Activity - Insufficiently Active (01/30/2023)   Exercise Vital Sign    Days of Exercise per Week: 3 days    Minutes of Exercise per Session: 20 min  Stress: No Stress Concern  Present (09/17/2023)   [employer] of Occupational Health - Occupational Stress Questionnaire    Feeling of Stress : Not at all  Social Connections: Socially Integrated (09/17/2023)   Social Connection and Isolation Panel [NHANES]    Frequency of Communication with Friends and Family: More than three times a week    Frequency of Social Gatherings with Friends and Family: Three times a week    Attends Religious Services: More than 4 times per year    Active Member of Clubs or Organizations: Yes    Attends Banker Meetings: More than 4 times per year    Marital Status: Married  Catering Manager Violence: Not At Risk (04/12/2023)   Humiliation, Afraid, Rape, and Kick questionnaire    Fear of Current or Ex-Partner: No    Emotionally Abused: No    Physically Abused: No    Sexually Abused: No   Family History  Problem Relation Age of Onset   Hypertension Mother    Macular degeneration Mother    Kidney disease Mother    Dementia Mother    Hypertension Father    Melanoma Sister    Stroke Other        uncle   Prostate cancer Other        uncle   Colon cancer Other        grandmother/uncle   Kidney disease Other        grandmother   Liver cancer Other        uncle (mets)   Heart disease Other        aunt/uncle   Lung cancer Other        uncle   Stomach cancer Cousin    Heart disease Cousin    Colon cancer Paternal Grandmother    Colon cancer Paternal Uncle    Prostate cancer Paternal Uncle    Arthritis Brother    Hypertension Brother    Esophageal cancer Neg Hx    Rectal cancer Neg Hx      Review of Systems  Neurological:  Positive for dizziness and headaches.  All other systems reviewed and are negative.      Objective:   Physical Exam Vitals reviewed.  Constitutional:      General: He is not in acute distress.    Appearance: He is well-developed. He is not diaphoretic.  HENT:     Head: Normocephalic and  atraumatic.     Right Ear: External ear  normal.     Left Ear: External ear normal.     Nose: Nose normal.     Mouth/Throat:     Pharynx: No oropharyngeal exudate.  Eyes:     General: No scleral icterus.       Right eye: No discharge.        Left eye: No discharge.     Conjunctiva/sclera: Conjunctivae normal.     Pupils: Pupils are equal, round, and reactive to light.  Neck:     Thyroid : No thyromegaly.     Vascular: No JVD.     Trachea: No tracheal deviation.   Cardiovascular:     Rate and Rhythm: Normal rate and regular rhythm.     Heart sounds: Normal heart sounds. No murmur heard.    No friction rub. No gallop.  Pulmonary:     Effort: Pulmonary effort is normal. No respiratory distress.     Breath sounds: Normal breath sounds. No stridor. No wheezing or rales.  Chest:     Chest wall: No tenderness.  Abdominal:     General: Bowel sounds are normal. There is no distension.     Palpations: Abdomen is soft. There is no mass.     Tenderness: There is no abdominal tenderness. There is no guarding or rebound.  Musculoskeletal:     Cervical back: Neck supple. Crepitus present. Decreased range of motion.     Lumbar back: No spasms or tenderness. Normal range of motion.       Legs:  Lymphadenopathy:     Cervical: No cervical adenopathy.  Skin:    General: Skin is warm.     Coloration: Skin is not pale.     Findings: No erythema or rash.  Neurological:     Mental Status: He is alert and oriented to person, place, and time.     Cranial Nerves: No cranial nerve deficit.     Motor: No abnormal muscle tone.     Coordination: Coordination normal.     Deep Tendon Reflexes: Reflexes are normal and symmetric.  Psychiatric:        Behavior: Behavior normal.        Thought Content: Thought content normal.        Judgment: Judgment normal.         Assessment & Plan:  Left sided sciatica  Neck stiffness Start the patient on a prednisone  taper pack.  I suspect that he may have slipped a disc in his lower back when he  fell.  Pain in his left leg sounds more neuropathic and I suspect sciatica.  The neck stiffness seems to be more in muscle spasms and muscle tightness and I suspect this is related to underlying degenerative disc disease in the neck.  Hopefully prednisone  will help both issues.

## 2023-10-08 ENCOUNTER — Encounter
Payer: Medicare Other | Attending: Physical Medicine and Rehabilitation | Admitting: Physical Medicine and Rehabilitation

## 2023-10-08 VITALS — BP 131/78 | HR 63 | Ht 69.5 in | Wt 242.0 lb

## 2023-10-08 DIAGNOSIS — R7303 Prediabetes: Secondary | ICD-10-CM | POA: Diagnosis not present

## 2023-10-08 DIAGNOSIS — G629 Polyneuropathy, unspecified: Secondary | ICD-10-CM | POA: Diagnosis present

## 2023-10-08 DIAGNOSIS — R296 Repeated falls: Secondary | ICD-10-CM | POA: Diagnosis not present

## 2023-10-08 DIAGNOSIS — T148XXA Other injury of unspecified body region, initial encounter: Secondary | ICD-10-CM

## 2023-10-08 MED ORDER — TOPIRAMATE 25 MG PO TABS: 25.0000 mg | ORAL_TABLET | Freq: Every day | ORAL | 3 refills | Status: DC | PRN

## 2023-10-08 NOTE — Progress Notes (Signed)
Subjective:    Patient ID: Robert Page, male    DOB: 01/05/1946, 78 y.o.   MRN: [id number]  HPI  1) Peripheral neuropathy: -getting worse Male with pmh/psh of polyneuropathy, atrial tachycardia, back surgery (?laminectomy L4-5), left foot surgery (tumor removal x4), dupuytens (palms and soles), HTN, GERD presents for follow-up of bilateral neuropathy.  Initially stated: Started summer 2000.  He had foot surgery in left foot in ~2016. Progressing.  Radiating proximally.  No symptoms in hands.  Ambulating and activity improves the pain. Shoes and inactivity exacerbate the pain. Hot and tingling/burning.  He notes I am the 6th doctor he's been to. He saw PCP and Ortho, who did MRI and told non-surgical.  He was referred to Rheum, who did not believe it was RA and thought it was L4-5 radic.  He was referred to Neurology, who ordered NCV/EMG (showing polyneuropathy, left peroneal neuropathy, Left L5/S1 radic) and MRI.  MRI C-spine showing mild disc bulges C3-6. He had MRI L-spine.  He saw Neurosurg, who did Myelogram and was told symptoms not originating in back.  Constant.  Associated numbness.  Gabapentin, Lyrica, Cymbalta did not help. He was told he has tried "every single medication for neuropathy" without benefit. He notes improvement when he sleeps in a different bed.  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 Pain does not limit activities.   -he does not take methylfolate  -he does not think that the qutenza patches helped  -lid leaked of the semglutide- he brought it to the pharamcy and they made half of it again for him, but it still leaked. He might have seen some results but was not sure if it was the medicine or the physical work he did outside this summer  -he is not sure if the medications are helping  -he asks if lyrica can be increased  -neuropathy has been stable  Since that time,  patient states he has not tried heat. He states he had burning with Capsacin for a few days but it does help for some time. He obtained Vitamin levels. Sleep has improved.  Patient states he was losing weight, but gained in on vacation. Patient states he often does not have time to take care of his body, take meds consistently, etc.   -He is taking Lyrica and Cymbalta. He has tightness and stiffness in both lower extremities. He had a dose of steroid dose pack that did provide some relief to his back pain. If he is barefoot in the shower he feels off balance. He saw Dr. Victorino Dike at [facility]. He has had prior foot surgery- was told he had arthritis. He saw rheumatology and was told he does not have rheumatoid arthritis. He had EMG/NCS (results above). -he asks for refill of Lyrica and Cymbalta   Both feet are really stiff -he did not get enough benefit enough from Qutenza.  Discussed trying longer duration to 35 minutes today since he did not have any burning with prior treatment and he is agreeable to this Did not require ice packs during treatment today, but toward end of treatment developed burning on dorsum of right foot Pain can be very severe at night, this is when pain continues to be worse Needs medication refills today It negatively affects his sleep He feels burning pain worst on tops of both feet but also present in soles He also  has pain radiating from his back into his right shin He is ok on his medicines- no refills needed right now He is not sure if Lyrica or Cymbalta are helping. He will let us know when he needs refills Neuropathy has been stable, he feels stiff and numbness in his feet He does not want to take things that are not needed -His wife has been diagnosed with lewy Body dementia -she was started on Trazodone.  -he does not use a heating pad -he has never tried metformin  2) Obesity: -patient notes [facility] was denied and he asks about alternative options -he  felt less hungry with the semalglutide.  -he is back up to 239 lbs -asks for medication that can help with weight loss  3) Muscle cramps: - he has been getting muscle cramps at night  Pain Inventory Average Pain 5 Pain Right Now 5 My pain is burning at times, burning  In the last 24 hours, has pain interfered with the following? General activity 4 Relation with others 3 Enjoyment of life 7 What TIME of day is your pain at its worst?  evening Sleep (in general) Fair  Pain is worse with: sitting, standing, some activities Pain improves with: rest Relief from Meds: 3   Family History  Problem Relation Age of Onset   Hypertension Mother    Macular degeneration Mother    Kidney disease Mother    Dementia Mother    Hypertension Father    Melanoma Sister    Stroke Other        uncle   Prostate cancer Other        uncle   Colon cancer Other        grandmother/uncle   Kidney disease Other        grandmother   Liver cancer Other        uncle (mets)   Heart disease Other        aunt/uncle   Lung cancer Other        uncle   Stomach cancer Cousin    Heart disease Cousin    Colon cancer Paternal Grandmother    Colon cancer Paternal Uncle    Prostate cancer Paternal Uncle    Arthritis Brother    Hypertension Brother    Esophageal cancer Neg Hx    Rectal cancer Neg Hx    Social History   Socioeconomic History   Marital status: Married    Spouse name: Robert Page   Number of children: 3   Years of education: Not on file   Highest education level: Bachelor's degree (e.g., BA, AB, BS)  Occupational History   Occupation: Retired   Occupation: retired  Tobacco Use   Smoking status: Former    Types: Cigars    Quit date: 2015    Years since quitting: 10.0   Smokeless tobacco: Never   Tobacco comments:    Pt states smokes occasional cigar.  This is typically < 1 time per month  Vaping Use   Vaping status: Never Used  Substance and Sexual Activity   Alcohol  use: No    Alcohol/week: 0.0 standard drinks of alcohol   Drug use: No   Sexual activity: Not on file  Other Topics Concern   Not on file  Social History Narrative   Pt lives in [place].     Retired from [employer] and [employer].     Goes on mission trips to [place], gets regular exercise.   Caffeine use:  very rare, Diet coke   Right handed    Social Drivers of Health   Financial Resource Strain: Low Risk  (09/17/2023)   Overall Financial Resource Strain (CARDIA)    Difficulty of Paying Living Expenses: Not hard at all  Food Insecurity: No Food Insecurity (09/17/2023)   Hunger Vital Sign    Worried About Running Out of Food in the Last Year: Never true    Ran Out of Food in the Last Year: Never true  Transportation Needs: No Transportation Needs (09/17/2023)   PRAPARE - Administrator, Civil Service (Medical): No    Lack of Transportation (Non-Medical): No  Physical Activity: Patient Declined (04/08/2023)   Exercise Vital Sign    Days of Exercise per Week: Patient declined    Minutes of Exercise per Session: Patient declined  Recent Concern: Physical Activity - Insufficiently Active (01/30/2023)   Exercise Vital Sign    Days of Exercise per Week: 3 days    Minutes of Exercise per Session: 20 min  Stress: No Stress Concern Present (09/17/2023)   [employer] of Occupational Health - Occupational Stress Questionnaire    Feeling of Stress : Not at all  Social Connections: Socially Integrated (09/17/2023)   Social Connection and Isolation Panel [NHANES]    Frequency of Communication with Friends and Family: More than three times a week    Frequency of Social Gatherings with Friends and Family: Three times a week    Attends Religious Services: More than 4 times per year    Active Member of Clubs or Organizations: Yes    Attends Banker Meetings: More than 4 times per year    Marital Status: Married   Past Surgical History:  Procedure Laterality Date    BACK SURGERY     12/2013   CARDIAC CATHETERIZATION  1990's   COLECTOMY  2010   for diverticulitis   COLON SURGERY N/A    [facility] 08/27/2020   COLONOSCOPY  04/03/2013   LASIK  2002   Bil   POLYPECTOMY     Past Medical History:  Diagnosis Date   Adenomatous polyp    Allergy    Arthritis    Atrial tachycardia (HCC)    Coronary artery calcification seen on CT scan 06/26/2023   Coronary artery Ca2+ (CT in 12/2017) Myoview 10/29/12: EF 57, no ischemia     Diverticulosis    Dupuytren's disease    palms and soles   GERD (gastroesophageal reflux disease)    Hypertension    Stroke (HCC)    BP 131/78   Pulse 63   Ht 5' 9.5" (1.765 m)   Wt 242 lb (109.8 kg)   SpO2 96%   BMI 35.22 kg/m   Opioid Risk Score:   Fall Risk Score:  `1  Depression screen [facility] 2/9     06/05/2023    9:13 AM 04/12/2023    1:00 PM 02/13/2023   10:46 AM 01/09/2023    2:51 PM 11/13/2022   10:38 AM 09/15/2022   11:59 AM 08/14/2022    1:37 PM  Depression screen PHQ 2/9  Decreased Interest 0 0 0 0 0 0 0  Down, Depressed, Hopeless 0 0 0 0 0 0 0  PHQ - 2 Score 0 0 0 0 0 0 0   Review of Systems  Constitutional: Negative.   HENT: Negative.    Eyes: Negative.   Respiratory: Negative.    Cardiovascular: Negative.   Gastrointestinal: Negative.   Endocrine: Negative.  Genitourinary: Negative.   Musculoskeletal:  Positive for gait problem.       Rt upper leg B/L lower leg  Skin: Negative.   Allergic/Immunologic: Negative.   Hematological: Negative.   Psychiatric/Behavioral: Negative.        Objective:   Physical Exam  Gen: no distress, normal appearing, weight 239 lbs, BMI 35.29, BP 142/84 HEENT: oral mucosa pink and moist, NCAT Cardio: Reg rate Chest: normal effort, normal rate of breathing Abd: soft, non-distended Ext: no edema Psych: pleasant, normal affect Skin: intact Neuro: Alert and oriented x3    Assessment & Plan:  Male with pmh/psh ofpolyneuropathy, atrial tachycardia, back surgery  (?laminectomy L4-5), left foot surgery (tumor removal x4), dupuytens (palms and soles), HTN, GERD presents for f/u b/l feet pain.  1. Chronic bilateral feet pain -   -discussed topamax 25mg  daily and he is agreeable  MRI foot report unremarkable.  MRI back report showing ? L5-S1 radic  Recommended whole food rich diet.   Recommended B vitamin complex with methylfolate, discussed this can also help to prevent heart disease  NCS/EMG - showing polyneuropathy and L5-SI radic on left, results reviewed.   No benefit with Gabapentin  Trial Heat, reminded again  Will consider PT  Discussing purchasing TENS unit from [employer]  Trial OTC Lidocaine patch  Increase Lyrica to 200mg  BID  Will consider referral to Neurosurg, pt would like to hold off at present  Will consider ESI, pt would like to hold off at present  Will consider PNS  Retry Capsacin, educated again  Continue Cymbalta  Continue lyrica   -will not repeat Qutenza given lack of benefit and cost  Prescribed metformin 500mg  daily prn  Patient states main goal is to be pain free - improving  Educated on importance of caring for self. -Discussed current symptoms of pain and history of pain.  -Discussed benefits of exercise in reducing pain. -Discussed following foods that may reduce pain: 1) Ginger (especially studied for arthritis)- reduce leukotriene production to decrease inflammation 2) Blueberries- high in phytonutrients that decrease inflammation 3) Salmon- marine omega-3s reduce joint swelling and pain 4) Pumpkin seeds- reduce inflammation 5) dark chocolate- reduces inflammation 6) turmeric- reduces inflammation 7) tart cherries - reduce pain and stiffness 8) extra virgin olive oil - its compound olecanthal helps to block prostaglandins  9) chili peppers- can be eaten or applied topically via capsaicin 10) mint- helpful for headache, muscle aches, joint pain, and itching 11) garlic- reduces inflammation  Link to further  information on diet for chronic pain: [email or web address].[email or web address]    2. Sleep disturbance  -discussed nasal strips and mouth taping  Discontinue amitriptyline  Discussed that Qutenza has been particularly shown to help with painful neuropathy at night  Continue lyrica to 200mg  BID -Try to go outside near sunrise -Get exercise during the day.  -Turn off all devices an hour before bedtime.  -Teas that can benefit: chamomile, valerian root, Brahmi (Bacopa) -Can consider over the counter melatonin, magnesium, and/or L-theanine. Melatonin is an anti-oxidant with multiple health benefits. Magnesium is involved in greater than 300 enzymatic reactions in the body and most of [place] are deficient as our soil is often depleted. There are 7 different types of magnesium- Bioptemizer's is a supplement with all 7 types, and each has unique benefits. Magnesium can also help with constipation and anxiety.  -Pistachios naturally increase the production of melatonin -Cozy Earth bamboo bed sheets are free from toxic chemicals.  -Tart cherry juice or a tart cherry supplement can  improve sleep and soreness post-workout   Continue tylenol PM  3. Obesity, BMI 34.18, weight is 239 lbs  -topamx 25mg  daily prescribed  Discussed sleep apnea testing  Discussed weight is stable from last visit  Discussed that [facility] was denied  Discussed risks and benefits of Contrave, Qysimia, and compounded semglutide and patient chose latter  Not interested in seeing dietitian  Educated again  Discussed following criteria for [facility]: 1) Patient has diagnosis of obesity; 2) Patient must be 76 years of age or older; 3) The patient has been involved in a physician or dietitian monitored weight loss program, consisting of both low-calorie diet, increased physical activity and behavioral counseling for a minimum of 6 months without a 3% loss from baseline; 4) Patients BMI is one  of the following: a) 30kg/m or greater; b) 27 29.99 kg/m in the presence of at least one weight related comorbidity such as coronary heart disease, dyslipidemia, hypertension, symptomatic arthritis of the lower extremities, type 2 diabetes mellitus, obstructive sleep apnea; c) 25-29.9 kg/m2 and waist circumference is &gt; 40 inches for males or &gt; 35 inches for females;5) Not Met - Must have tried and failed at least one preferred oral agent such as Phentermine; 6) Patient has no labeled contraindication; 7) Patient is not taking another weight loss medication; 8) The dose is within approved product labeling guidelines; 10) The patient must not be taking another GLP-1 receptor agonist AND the patient must not be taking insulin concurrently.   -prescribed WGNFAO -recommended resistance training and prioritizing protein intake to avoid muscle loss while taking this medication -discussed risks of thyroid cancer and gastroparesis   4. Myalgia  D/c robaxin  Lipid panel reviewed and normal  5. Prediabetes: -HgbA1c reviewed and is 5.9 -metformin 500mg  daily prn ordered.  -discussed diet  -discussed that he does not use a lot of sugar  6) HTN: -check BMP and magnesium level -discussed trying to minimize stress from his wife's diagnosis -discussed that he has a lot of support from his two loving daughters and son  7) leg swelling:  -recommended frequent rest breaks when driving  8) History of CVA: -recommended methylated B vitamin -continue green beans and garden peas  9) Fatigue:  -recommended avoiding visceral fat in food -discussed checking testosterone level -discussed vitamin D is probably fine   10) muscle cramps: -recommended magnesium glycinate 250mg  HS, discussed this can also help with sleep.   11) Chronic wound: -discussed applying manuka honey onto wound -discussed red light therapy  12) Frequent falls: -discussed that he fell a few times in the past few months and feels  it was because he was not picking his feet up

## 2023-10-09 LAB — HEMOGLOBIN A1C
Est. average glucose Bld gHb Est-mCnc: 134 mg/dL
Hgb A1c MFr Bld: 6.3 % — ABNORMAL HIGH (ref 4.8–5.6)

## 2023-10-11 ENCOUNTER — Encounter: Payer: Medicare Other | Admitting: Physical Medicine and Rehabilitation

## 2023-10-11 DIAGNOSIS — R7303 Prediabetes: Secondary | ICD-10-CM

## 2023-10-11 NOTE — Progress Notes (Signed)
 Subjective:    Patient ID: Robert Page, male    DOB: Apr 27, 1946, 78 y.o.   MRN: [id number]  HPI  1) Peripheral neuropathy: -getting worse Male with pmh/psh of polyneuropathy, atrial tachycardia, back surgery (?laminectomy L4-5), left foot surgery (tumor removal x4), dupuytens (palms and soles), HTN, GERD presents for follow-up of bilateral neuropathy.  Initially stated: Started summer 2000.  He had foot surgery in left foot in ~2016. Progressing.  Radiating proximally.  No symptoms in hands.  Ambulating and activity improves the pain. Shoes and inactivity exacerbate the pain. Hot and tingling/burning.  He notes I am the 6th doctor he's been to. He saw PCP and Ortho, who did MRI and told non-surgical.  He was referred to Rheum, who did not believe it was RA and thought it was L4-5 radic.  He was referred to Neurology, who ordered NCV/EMG (showing polyneuropathy, left peroneal neuropathy, Left L5/S1 radic) and MRI.  MRI C-spine showing mild disc bulges C3-6. He had MRI L-spine.  He saw Neurosurg, who did Myelogram and was told symptoms not originating in back.  Constant.  Associated numbness.  Gabapentin , Lyrica , Cymbalta  did not help. He was told he has tried every single medication for neuropathy without benefit. He notes improvement when he sleeps in a different bed.  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 Pain does not limit activities.   -he does not take methylfolate  -he does not think that the qutenza  patches helped  -lid leaked of the semglutide- he brought it to the pharamcy and they made half of it again for him, but it still leaked. He might have seen some results but was not sure if it was the medicine or the physical work he did outside this summer  -he is not sure if the medications are helping  -he asks if lyrica  can be increased  -neuropathy has been stable  Since that time,  patient states he has not tried heat. He states he had burning with Capsacin for a few days but it does help for some time. He obtained Vitamin levels. Sleep has improved.  Patient states he was losing weight, but gained in on vacation. Patient states he often does not have time to take care of his body, take meds consistently, etc.   -He is taking Lyrica  and Cymbalta . He has tightness and stiffness in both lower extremities. He had a dose of steroid dose pack that did provide some relief to his back pain. If he is barefoot in the shower he feels off balance. He saw Dr. Kit at [facility]. He has had prior foot surgery- was told he had arthritis. He saw rheumatology and was told he does not have rheumatoid arthritis. He had EMG/NCS (results above). -he asks for refill of Lyrica  and Cymbalta    Both feet are really stiff -he did not get enough benefit enough from Qutenza .  Discussed trying longer duration to 35 minutes today since he did not have any burning with prior treatment and he is agreeable to this Did not require ice packs during treatment today, but toward end of treatment developed burning on dorsum of right foot Pain can be very severe at night, this is when pain continues to be worse Needs medication refills today It negatively affects his sleep He feels burning pain worst on tops of both feet but also present in soles He also  has pain radiating from his back into his right shin He is ok on his medicines- no refills needed right now He is not sure if Lyrica  or Cymbalta  are helping. He will let us  know when he needs refills Neuropathy has been stable, he feels stiff and numbness in his feet He does not want to take things that are not needed -His wife has been diagnosed with lewy Body dementia -she was started on Trazodone.  -he does not use a heating pad -he has never tried metformin   2) Obesity: -patient notes Wegovy  was denied and he asks about alternative options -he  felt less hungry with the semalglutide.  -he is back up to 239 lbs -asks for medication that can help with weight loss  3) Muscle cramps: - he has been getting muscle cramps at night  4) Prediabetes -HgbA1c increased to 6.3  Pain Inventory Average Pain 5 Pain Right Now 5 My pain is burning at times, burning  In the last 24 hours, has pain interfered with the following? General activity 4 Relation with others 3 Enjoyment of life 7 What TIME of day is your pain at its worst?  evening Sleep (in general) Fair  Pain is worse with: sitting, standing, some activities Pain improves with: rest Relief from Meds: 3   Family History  Problem Relation Age of Onset   Hypertension Mother    Macular degeneration Mother    Kidney disease Mother    Dementia Mother    Hypertension Father    Melanoma Sister    Stroke Other        uncle   Prostate cancer Other        uncle   Colon cancer Other        grandmother/uncle   Kidney disease Other        grandmother   Liver cancer Other        uncle (mets)   Heart disease Other        aunt/uncle   Lung cancer Other        uncle   Stomach cancer Cousin    Heart disease Cousin    Colon cancer Paternal Grandmother    Colon cancer Paternal Uncle    Prostate cancer Paternal Uncle    Arthritis Brother    Hypertension Brother    Esophageal cancer Neg Hx    Rectal cancer Neg Hx    Social History   Socioeconomic History   Marital status: Married    Spouse name: Dashun Borre   Number of children: 3   Years of education: Not on file   Highest education level: Bachelor's degree (e.g., BA, AB, BS)  Occupational History   Occupation: Retired   Occupation: retired  Tobacco Use   Smoking status: Former    Types: Cigars    Quit date: 2015    Years since quitting: 10.1   Smokeless tobacco: Never   Tobacco comments:    Pt states smokes occasional cigar.  This is typically < 1 time per month  Vaping Use   Vaping status: Never Used   Substance and Sexual Activity   Alcohol use: No    Alcohol/week: 0.0 standard drinks of alcohol   Drug use: No   Sexual activity: Not on file  Other Topics Concern   Not on file  Social History Narrative   Pt lives in [place].     Retired from [employer] and [employer].     Goes on mission trips to [place],  gets regular exercise.   Caffeine use: very rare, Diet coke   Right handed    Social Drivers of Health   Financial Resource Strain: Low Risk  (09/17/2023)   Overall Financial Resource Strain (CARDIA)    Difficulty of Paying Living Expenses: Not hard at all  Food Insecurity: No Food Insecurity (09/17/2023)   Hunger Vital Sign    Worried About Running Out of Food in the Last Year: Never true    Ran Out of Food in the Last Year: Never true  Transportation Needs: No Transportation Needs (09/17/2023)   PRAPARE - Administrator, Civil Service (Medical): No    Lack of Transportation (Non-Medical): No  Physical Activity: Patient Declined (04/08/2023)   Exercise Vital Sign    Days of Exercise per Week: Patient declined    Minutes of Exercise per Session: Patient declined  Recent Concern: Physical Activity - Insufficiently Active (01/30/2023)   Exercise Vital Sign    Days of Exercise per Week: 3 days    Minutes of Exercise per Session: 20 min  Stress: No Stress Concern Present (09/17/2023)   [employer] of Occupational Health - Occupational Stress Questionnaire    Feeling of Stress : Not at all  Social Connections: Socially Integrated (09/17/2023)   Social Connection and Isolation Panel [NHANES]    Frequency of Communication with Friends and Family: More than three times a week    Frequency of Social Gatherings with Friends and Family: Three times a week    Attends Religious Services: More than 4 times per year    Active Member of Clubs or Organizations: Yes    Attends Banker Meetings: More than 4 times per year    Marital Status: Married   Past Surgical  History:  Procedure Laterality Date   BACK SURGERY     12/2013   CARDIAC CATHETERIZATION  1990's   COLECTOMY  2010   for diverticulitis   COLON SURGERY N/A    [facility] 08/27/2020   COLONOSCOPY  04/03/2013   LASIK  2002   Bil   POLYPECTOMY     Past Medical History:  Diagnosis Date   Adenomatous polyp    Allergy     Arthritis    Atrial tachycardia (HCC)    Coronary artery calcification seen on CT scan 06/26/2023   Coronary artery Ca2+ (CT in 12/2017) Myoview 10/29/12: EF 57, no ischemia     Diverticulosis    Dupuytren's disease    palms and soles   GERD (gastroesophageal reflux disease)    Hypertension    Stroke (HCC)    There were no vitals taken for this visit.  Opioid Risk Score:   Fall Risk Score:  `1  Depression screen [facility] 2/9     06/05/2023    9:13 AM 04/12/2023    1:00 PM 02/13/2023   10:46 AM 01/09/2023    2:51 PM 11/13/2022   10:38 AM 09/15/2022   11:59 AM 08/14/2022    1:37 PM  Depression screen PHQ 2/9  Decreased Interest 0 0 0 0 0 0 0  Down, Depressed, Hopeless 0 0 0 0 0 0 0  PHQ - 2 Score 0 0 0 0 0 0 0   Review of Systems  Constitutional: Negative.   HENT: Negative.    Eyes: Negative.   Respiratory: Negative.    Cardiovascular: Negative.   Gastrointestinal: Negative.   Endocrine: Negative.   Genitourinary: Negative.   Musculoskeletal:  Positive for gait problem.  Rt upper leg B/L lower leg  Skin: Negative.   Allergic/Immunologic: Negative.   Hematological: Negative.   Psychiatric/Behavioral: Negative.        Objective:   Physical Exam  PRIOR EXAM Gen: no distress, normal appearing, weight 239 lbs, BMI 35.29, BP 142/84 HEENT: oral mucosa pink and moist, NCAT Cardio: Reg rate Chest: normal effort, normal rate of breathing Abd: soft, non-distended Ext: no edema Psych: pleasant, normal affect Skin: intact Neuro: Alert and oriented x3    Assessment & Plan:  Male with pmh/psh ofpolyneuropathy, atrial tachycardia, back surgery  (?laminectomy L4-5), left foot surgery (tumor removal x4), dupuytens (palms and soles), HTN, GERD presents for f/u b/l feet pain.  1. Chronic bilateral feet pain -   -discussed topamax  25mg  daily and he is agreeable  MRI foot report unremarkable.  MRI back report showing ? L5-S1 radic  Recommended whole food rich diet.   Recommended B vitamin complex with methylfolate, discussed this can also help to prevent heart disease  NCS/EMG - showing polyneuropathy and L5-SI radic on left, results reviewed.   No benefit with Gabapentin   Trial Heat, reminded again  Will consider PT  Discussing purchasing TENS unit from [employer]  Trial OTC Lidocaine  patch  Increase Lyrica  to 200mg  BID  Will consider referral to Neurosurg, pt would like to hold off at present  Will consider ESI, pt would like to hold off at present  Will consider PNS  Retry Capsacin, educated again  Continue Cymbalta   Continue lyrica    -will not repeat Qutenza  given lack of benefit and cost  Prescribed metformin  500mg  daily prn  Patient states main goal is to be pain free - improving  Educated on importance of caring for self. -Discussed current symptoms of pain and history of pain.  -Discussed benefits of exercise in reducing pain. -Discussed following foods that may reduce pain: 1) Ginger (especially studied for arthritis)- reduce leukotriene production to decrease inflammation 2) Blueberries- high in phytonutrients that decrease inflammation 3) Salmon- marine omega-3s reduce joint swelling and pain 4) Pumpkin seeds- reduce inflammation 5) dark chocolate- reduces inflammation 6) turmeric- reduces inflammation 7) tart cherries - reduce pain and stiffness 8) extra virgin olive oil - its compound olecanthal helps to block prostaglandins  9) chili peppers- can be eaten or applied topically via capsaicin  10) mint- helpful for headache, muscle aches, joint pain, and itching 11) garlic- reduces inflammation  Link to further  information on diet for chronic pain: [email or web address].[email or web address]    2. Sleep disturbance  -discussed nasal strips and mouth taping  Discontinue amitriptyline   Discussed that Qutenza  has been particularly shown to help with painful neuropathy at night  Continue lyrica  to 200mg  BID -Try to go outside near sunrise -Get exercise during the day.  -Turn off all devices an hour before bedtime.  -Teas that can benefit: chamomile, valerian root, Brahmi (Bacopa) -Can consider over the counter melatonin, magnesium, and/or L-theanine. Melatonin is an anti-oxidant with multiple health benefits. Magnesium is involved in greater than 300 enzymatic reactions in the body and most of us  are deficient as our soil is often depleted. There are 7 different types of magnesium- Bioptemizer's is a supplement with all 7 types, and each has unique benefits. Magnesium can also help with constipation and anxiety.  -Pistachios naturally increase the production of melatonin -Cozy Earth bamboo bed sheets are free from toxic chemicals.  -Tart cherry juice or a tart cherry supplement can improve sleep and soreness post-workout   Continue tylenol  PM  3. Obesity, BMI  34.18, weight is 239 lbs  -topamx 25mg  daily prescribed  Discussed sleep apnea testing  Discussed weight is stable from last visit  Discussed that [facility] was denied  Discussed risks and benefits of Contrave, Qysimia, and compounded semglutide and patient chose latter  Not interested in seeing dietitian  Educated again  Discussed following criteria for Wegovy : 1) Patient has diagnosis of obesity; 2) Patient must be 64 years of age or older; 3) The patient has been involved in a physician or dietitian monitored weight loss program, consisting of both low-calorie diet, increased physical activity and behavioral counseling for a minimum of 6 months without a 3% loss from baseline; 4) Patients BMI is one  of the following: a) 30kg/m or greater; b) 27 29.99 kg/m in the presence of at least one weight related comorbidity such as coronary heart disease, dyslipidemia, hypertension, symptomatic arthritis of the lower extremities, type 2 diabetes mellitus, obstructive sleep apnea; c) 25-29.9 kg/m2 and waist circumference is &gt; 40 inches for males or &gt; 35 inches for females;5) Not Met - Must have tried and failed at least one preferred oral agent such as Phentermine; 6) Patient has no labeled contraindication; 7) Patient is not taking another weight loss medication; 8) The dose is within approved product labeling guidelines; 10) The patient must not be taking another GLP-1 receptor agonist AND the patient must not be taking insulin concurrently.   -prescribed Wegovy  -recommended resistance training and prioritizing protein intake to avoid muscle loss while taking this medication -discussed risks of thyroid  cancer and gastroparesis   4. Myalgia  D/c robaxin   Lipid panel reviewed and normal  5. Prediabetes: -discussed that HgbA1c increased to 6.3 -discussed stopping soda and sweet tea -discussed making own sweet tea at home with black tea and adding honey or allulose as sweeteners if he feels he needs sweetener -metformin  500mg  daily prn ordered.  -discussed diet  -discussed that he does not use a lot of sugar  6) HTN: -check BMP and magnesium level -discussed trying to minimize stress from his wife's diagnosis -discussed that he has a lot of support from his two loving daughters and son  7) leg swelling:  -recommended frequent rest breaks when driving  8) History of CVA: -recommended methylated B vitamin -continue green beans and garden peas  9) Fatigue:  -recommended avoiding visceral fat in food -discussed checking testosterone  level -discussed vitamin D is probably fine   10) muscle cramps: -recommended magnesium glycinate 250mg  HS, discussed this can also help with sleep.   11)  Chronic wound: -discussed applying manuka honey onto wound -discussed red light therapy  12) Frequent falls: -discussed that he fell a few times in the past few months and feels it was because he was not picking his feet up  5 minutes spent in discussion of his HgbA1c, discussed that it has increased to 6.3, advised to stop drinking soda and sweet tea, discussed that he has also stopped eating cookies

## 2023-10-12 ENCOUNTER — Encounter: Payer: Self-pay | Admitting: Family Medicine

## 2023-10-12 ENCOUNTER — Ambulatory Visit (INDEPENDENT_AMBULATORY_CARE_PROVIDER_SITE_OTHER): Payer: Medicare Other | Admitting: Family Medicine

## 2023-10-12 VITALS — BP 130/72 | HR 68 | Temp 98.5°F | Ht 69.5 in | Wt 243.6 lb

## 2023-10-12 DIAGNOSIS — Z23 Encounter for immunization: Secondary | ICD-10-CM

## 2023-10-12 DIAGNOSIS — E7800 Pure hypercholesterolemia, unspecified: Secondary | ICD-10-CM | POA: Diagnosis not present

## 2023-10-12 DIAGNOSIS — Z0001 Encounter for general adult medical examination with abnormal findings: Secondary | ICD-10-CM | POA: Diagnosis not present

## 2023-10-12 DIAGNOSIS — Z0000 Encounter for general adult medical examination without abnormal findings: Secondary | ICD-10-CM

## 2023-10-12 DIAGNOSIS — R911 Solitary pulmonary nodule: Secondary | ICD-10-CM

## 2023-10-12 DIAGNOSIS — R7303 Prediabetes: Secondary | ICD-10-CM

## 2023-10-12 DIAGNOSIS — I10 Essential (primary) hypertension: Secondary | ICD-10-CM | POA: Diagnosis not present

## 2023-10-12 DIAGNOSIS — G629 Polyneuropathy, unspecified: Secondary | ICD-10-CM

## 2023-10-12 NOTE — Progress Notes (Signed)
 Subjective:    Patient ID: Robert Page, male    DOB: [birth year], 78 y.o.   MRN: [id number]  HPI Patient is a very pleasant 78 year old Caucasian gentleman who presents today for complete physical exam.  Reviewing the patient's immunizations, his tetanus shot is not due again until 2028.  He politely declines the shingles vaccine as he has had shingles previously.  He is due for the new pneumonia vaccine and he would like to get that today.  He is due for a flu shot, the RSV vaccine, and a COVID shot.  We discussed these today and he deferred the rest of the vaccines for now.  His colonoscopy was performed in 2024.  They did find tubular adenoma however due to age she does not require another colonoscopy.  His PSA and prostate monitor his urologist.  He had a CT scan of the lungs last July that showed stable Neri nodule.  No further follow-up CT is recommended for this.  However he did have moderate coronary artery calcification seen.  Therefore I would like to keep his LDL cholesterol less than 55.  He is due to check that today.  His blood pressure is excellent at 130/72.  He recently had lab work that showed borderline diabetes with a hemoglobin A1c of 6.3.  We did discuss a low carbohydrate diet today   Immunization History  Administered Date(s) Administered   Fluad Quad(high Dose 65+) 08/10/2020   H1N1 09/23/2008   Influenza Split 06/04/2014   Influenza,inj,Quad PF,6+ Mos 08/08/2013, 05/17/2015, 10/17/2016   PFIZER(Purple Top)SARS-COV-2 Vaccination 09/25/2019, 10/16/2019, 08/30/2020   Pneumococcal Conjugate-13 10/17/2016   Pneumococcal Polysaccharide-23 03/11/2013   Td 03/04/2004   Tdap 10/17/2016    Past Medical History:  Diagnosis Date   Adenomatous polyp    Allergy     Arthritis    Atrial tachycardia (HCC)    Coronary artery calcification seen on CT scan 06/26/2023   Coronary artery Ca2+ (CT in 12/2017) Myoview 10/29/12: EF 57, no ischemia     Diverticulosis    Dupuytren's  disease    palms and soles   GERD (gastroesophageal reflux disease)    Hypertension    Stroke [facility])    Past Surgical History:  Procedure Laterality Date   BACK SURGERY     12/2013   CARDIAC CATHETERIZATION  1990's   COLECTOMY  2010   for diverticulitis   COLON SURGERY N/A    [facility] 08/27/2020   COLONOSCOPY  04/03/2013   LASIK  2002   Bil   POLYPECTOMY     Current Outpatient Medications on File Prior to Visit  Medication Sig Dispense Refill   acetaminophen  (TYLENOL ) 500 MG tablet Take 500 mg by mouth as needed for mild pain (pain score 1-3) (neuropathy).     amLODipine  (NORVASC ) 5 MG tablet Take 1 tablet (5 mg total) by mouth daily. 90 tablet 3   aspirin EC 81 MG tablet Take 81 mg by mouth daily.     DULoxetine  (CYMBALTA ) 60 MG capsule TAKE 1 CAPSULE BY MOUTH EVERY DAY 90 capsule 3   ezetimibe  (ZETIA ) 10 MG tablet Take 1 tablet (10 mg total) by mouth daily. 90 tablet 3   levocetirizine (XYZAL ) 5 MG tablet TAKE 1 TABLET BY MOUTH EVERY DAY IN THE EVENING 90 tablet 3   metFORMIN  (GLUCOPHAGE ) 500 MG tablet TAKE 1 TABLET BY MOUTH EVERY DAY WITH BREAKFAST 90 tablet 3   omeprazole (PRILOSEC) 20 MG capsule Take 20 mg by mouth daily.  predniSONE  (DELTASONE ) 20 MG tablet 3 tabs poqday 1-2, 2 tabs poqday 3-4, 1 tab poqday 5-6 12 tablet 0   pregabalin  (LYRICA ) 200 MG capsule Take 1 capsule (200 mg total) by mouth 2 (two) times daily. 180 capsule 3   tadalafil (CIALIS) 5 MG tablet Take 5 mg by mouth daily.     topiramate  (TOPAMAX ) 25 MG tablet Take 1 tablet (25 mg total) by mouth daily as needed. 60 tablet 3   valsartan  (DIOVAN ) 160 MG tablet Take 1 tablet (160 mg total) by mouth daily. 90 tablet 2   No current facility-administered medications on file prior to visit.   Allergies  Allergen Reactions   Sulfamethoxazole Other (See Comments)    other   Social History   Socioeconomic History   Marital status: Married    Spouse name: Sheron Robin   Number of children: 3   Years  of education: Not on file   Highest education level: Bachelor's degree (e.g., BA, AB, BS)  Occupational History   Occupation: Retired   Occupation: retired  Tobacco Use   Smoking status: Former    Types: Cigars    Quit date: 2015    Years since quitting: 10.1   Smokeless tobacco: Never   Tobacco comments:    Pt states smokes occasional cigar.  This is typically < 1 time per month  Vaping Use   Vaping status: Never Used  Substance and Sexual Activity   Alcohol use: No    Alcohol/week: 0.0 standard drinks of alcohol   Drug use: No   Sexual activity: Not on file  Other Topics Concern   Not on file  Social History Narrative   Pt lives in [place].     Retired from [employer] and [employer].     Goes on mission trips to [place], gets regular exercise.   Caffeine use: very rare, Diet coke   Right handed    Social Drivers of Health   Financial Resource Strain: Low Risk  (09/17/2023)   Overall Financial Resource Strain (CARDIA)    Difficulty of Paying Living Expenses: Not hard at all  Food Insecurity: No Food Insecurity (09/17/2023)   Hunger Vital Sign    Worried About Running Out of Food in the Last Year: Never true    Ran Out of Food in the Last Year: Never true  Transportation Needs: No Transportation Needs (09/17/2023)   PRAPARE - Administrator, Civil Service (Medical): No    Lack of Transportation (Non-Medical): No  Physical Activity: Patient Declined (04/08/2023)   Exercise Vital Sign    Days of Exercise per Week: Patient declined    Minutes of Exercise per Session: Patient declined  Recent Concern: Physical Activity - Insufficiently Active (01/30/2023)   Exercise Vital Sign    Days of Exercise per Week: 3 days    Minutes of Exercise per Session: 20 min  Stress: No Stress Concern Present (09/17/2023)   [employer] of Occupational Health - Occupational Stress Questionnaire    Feeling of Stress : Not at all  Social Connections: Socially Integrated (09/17/2023)    Social Connection and Isolation Panel [NHANES]    Frequency of Communication with Friends and Family: More than three times a week    Frequency of Social Gatherings with Friends and Family: Three times a week    Attends Religious Services: More than 4 times per year    Active Member of Clubs or Organizations: Yes    Attends Banker Meetings: More  than 4 times per year    Marital Status: Married  Catering Manager Violence: Not At Risk (04/12/2023)   Humiliation, Afraid, Rape, and Kick questionnaire    Fear of Current or Ex-Partner: No    Emotionally Abused: No    Physically Abused: No    Sexually Abused: No   Family History  Problem Relation Age of Onset   Hypertension Mother    Macular degeneration Mother    Kidney disease Mother    Dementia Mother    Hypertension Father    Melanoma Sister    Stroke Other        uncle   Prostate cancer Other        uncle   Colon cancer Other        grandmother/uncle   Kidney disease Other        grandmother   Liver cancer Other        uncle (mets)   Heart disease Other        aunt/uncle   Lung cancer Other        uncle   Stomach cancer Cousin    Heart disease Cousin    Colon cancer Paternal Grandmother    Colon cancer Paternal Uncle    Prostate cancer Paternal Uncle    Arthritis Brother    Hypertension Brother    Esophageal cancer Neg Hx    Rectal cancer Neg Hx      Review of Systems  All other systems reviewed and are negative.      Objective:   Physical Exam Vitals reviewed.  Constitutional:      General: He is not in acute distress.    Appearance: He is well-developed. He is not diaphoretic.  HENT:     Head: Normocephalic and atraumatic.     Right Ear: External ear normal.     Left Ear: External ear normal.     Nose: Nose normal.     Mouth/Throat:     Pharynx: No oropharyngeal exudate.  Eyes:     General: No scleral icterus.       Right eye: No discharge.        Left eye: No discharge.      Conjunctiva/sclera: Conjunctivae normal.     Pupils: Pupils are equal, round, and reactive to light.  Neck:     Thyroid : No thyromegaly.     Vascular: No JVD.     Trachea: No tracheal deviation.  Cardiovascular:     Rate and Rhythm: Normal rate and regular rhythm.     Heart sounds: Normal heart sounds. No murmur heard.    No friction rub. No gallop.  Pulmonary:     Effort: Pulmonary effort is normal. No respiratory distress.     Breath sounds: Normal breath sounds. No stridor. No wheezing or rales.  Chest:     Chest wall: No tenderness.  Abdominal:     General: Bowel sounds are normal. There is no distension.     Palpations: Abdomen is soft. There is no mass.     Tenderness: There is no abdominal tenderness. There is no guarding or rebound.  Musculoskeletal:        General: No tenderness. Normal range of motion.     Cervical back: Normal range of motion and neck supple.  Lymphadenopathy:     Cervical: No cervical adenopathy.  Skin:    General: Skin is warm.     Coloration: Skin is not pale.     Findings: No erythema  or rash.  Neurological:     Mental Status: He is alert and oriented to person, place, and time.     Cranial Nerves: No cranial nerve deficit.     Motor: No abnormal muscle tone.     Coordination: Coordination normal.     Deep Tendon Reflexes: Reflexes are normal and symmetric.  Psychiatric:        Behavior: Behavior normal.        Thought Content: Thought content normal.        Judgment: Judgment normal.         Assessment & Plan:  Pure hypercholesterolemia - Plan: CBC with Differential/Platelet, COMPLETE METABOLIC PANEL WITH GFR, Lipid panel  General medical exam  Benign essential HTN  Pulmonary nodule  Prediabetes  Polyneuropathy CT scan performed in 2024 recommended no further follow-up as the pulmonary nodule was stable.  Because of the moderate coronary artery calcifications, I would like to keep his LDL cholesterol less than 55.  His blood  pressure is excellent.  I recommended a low carbohydrate diet to keep his hemoglobin A1c less than 6.5.  We discussed Ozempic  but he deferred this at the present time.  Colonoscopy is up-to-date.  PSA is up-to-date.  He received the new pneumonia vaccine today.  He deferred RSV, COVID, and the flu shot.

## 2023-10-12 NOTE — Addendum Note (Signed)
 Addended by: Gillermo Lack K on: 10/12/2023 11:28 AM   Modules accepted: Orders

## 2023-10-13 LAB — CBC WITH DIFFERENTIAL/PLATELET
Absolute Lymphocytes: 1176 {cells}/uL (ref 850–3900)
Absolute Monocytes: 510 {cells}/uL (ref 200–950)
Basophils Absolute: 67 {cells}/uL (ref 0–200)
Basophils Relative: 1.2 %
Eosinophils Absolute: 218 {cells}/uL (ref 15–500)
Eosinophils Relative: 3.9 %
HCT: 46.5 % (ref 38.5–50.0)
Hemoglobin: 15 g/dL (ref 13.2–17.1)
MCH: 28.5 pg (ref 27.0–33.0)
MCHC: 32.3 g/dL (ref 32.0–36.0)
MCV: 88.4 fL (ref 80.0–100.0)
MPV: 10.6 fL (ref 7.5–12.5)
Monocytes Relative: 9.1 %
Neutro Abs: 3629 {cells}/uL (ref 1500–7800)
Neutrophils Relative %: 64.8 %
Platelets: 244 10*3/uL (ref 140–400)
RBC: 5.26 10*6/uL (ref 4.20–5.80)
RDW: 13.1 % (ref 11.0–15.0)
Total Lymphocyte: 21 %
WBC: 5.6 10*3/uL (ref 3.8–10.8)

## 2023-10-13 LAB — COMPLETE METABOLIC PANEL WITH GFR
AG Ratio: 2 (calc) (ref 1.0–2.5)
ALT: 20 U/L (ref 9–46)
AST: 17 U/L (ref 10–35)
Albumin: 4.3 g/dL (ref 3.6–5.1)
Alkaline phosphatase (APISO): 59 U/L (ref 35–144)
BUN: 17 mg/dL (ref 7–25)
CO2: 28 mmol/L (ref 20–32)
Calcium: 9.3 mg/dL (ref 8.6–10.3)
Chloride: 107 mmol/L (ref 98–110)
Creat: 1.14 mg/dL (ref 0.70–1.28)
Globulin: 2.2 g/dL (ref 1.9–3.7)
Glucose, Bld: 109 mg/dL — ABNORMAL HIGH (ref 65–99)
Potassium: 4.9 mmol/L (ref 3.5–5.3)
Sodium: 141 mmol/L (ref 135–146)
Total Bilirubin: 0.5 mg/dL (ref 0.2–1.2)
Total Protein: 6.5 g/dL (ref 6.1–8.1)
eGFR: 67 mL/min/{1.73_m2} (ref >=60)

## 2023-10-13 LAB — LIPID PANEL
Cholesterol: 159 mg/dL (ref <200)
HDL: 51 mg/dL (ref >=40)
LDL Cholesterol (Calc): 93 mg/dL
Non-HDL Cholesterol (Calc): 108 mg/dL (ref <130)
Total CHOL/HDL Ratio: 3.1 (calc) (ref <5.0)
Triglycerides: 68 mg/dL (ref <150)

## 2023-12-13 ENCOUNTER — Encounter: Payer: Self-pay | Admitting: Family Medicine

## 2023-12-13 ENCOUNTER — Ambulatory Visit: Admitting: Family Medicine

## 2023-12-13 VITALS — BP 112/64 | HR 73 | Temp 97.5°F | Ht 69.45 in | Wt 236.8 lb

## 2023-12-13 DIAGNOSIS — J029 Acute pharyngitis, unspecified: Secondary | ICD-10-CM

## 2023-12-13 DIAGNOSIS — R1310 Dysphagia, unspecified: Secondary | ICD-10-CM | POA: Diagnosis not present

## 2023-12-13 NOTE — Progress Notes (Signed)
 Subjective:  HPI: Robert Page is a 78 y.o. male presenting on 12/13/2023 for No chief complaint on file.   HPI Patient is in today for sore throat and difficulty eating for 2-3 days. No cough, congestion, fever, chills, rhinorrhea, N/V/D. Endorses difficulty eating. Denies difficulty swallowing or swelling of throat. He has been hoarse intermittently for months. Reports food does get stuck in his throat sometimes or gets caught. Denies choking, difficulty swallowing.  Has seen ENT for similar symptoms 2-3 years ago and has also had an endoscopy with GI. Endoscopy 06/19/2023 showed hiatal hernia and slightly irregular z-line did not meet criteria for Barrett's. No known sick exposures.   GI notes 04/13/2023 for reference only:  Dysphagia, patient reported undergoing a lower endoscopy by ENT one year ago which was unrevealing. -I discussed scheduling a barium swallow study vs EGD. Patient noted his grandmother died following complications s/p EGD with esophageal dilatation. Patient wishes to pursue an EGD and not a barium swallow study. -EGD with possible esophageal dilatation at [facility] vs [facility], benefits and risks discussed including risk with sedation, risk of bleeding, perforation and infection  -Avoid eating large pieces of bread and meat.  Avoid lettuce.  Review of Systems  All other systems reviewed and are negative.   Relevant past medical history reviewed and updated as indicated.   Past Medical History:  Diagnosis Date   Adenomatous polyp    Allergy    Arthritis    Atrial tachycardia (HCC)    Coronary artery calcification seen on CT scan 06/26/2023   Coronary artery Ca2+ (CT in 12/2017) Myoview 10/29/12: EF 57, no ischemia     Diverticulosis    Dupuytren's disease    palms and soles   GERD (gastroesophageal reflux disease)    Hypertension    Stroke [facility])      Past Surgical History:  Procedure Laterality Date   BACK SURGERY     12/2013   CARDIAC CATHETERIZATION  1990's    COLECTOMY  2010   for diverticulitis   COLON SURGERY N/A    [facility] 08/27/2020   COLONOSCOPY  04/03/2013   LASIK  2002   Bil   POLYPECTOMY      Allergies and medications reviewed and updated.   Current Outpatient Medications:    amLODipine (NORVASC) 5 MG tablet, Take 1 tablet (5 mg total) by mouth daily., Disp: 90 tablet, Rfl: 3   aspirin EC 81 MG tablet, Take 81 mg by mouth daily., Disp: , Rfl:    DULoxetine (CYMBALTA) 60 MG capsule, TAKE 1 CAPSULE BY MOUTH EVERY DAY, Disp: 90 capsule, Rfl: 3   ezetimibe (ZETIA) 10 MG tablet, Take 1 tablet (10 mg total) by mouth daily., Disp: 90 tablet, Rfl: 3   levocetirizine (XYZAL) 5 MG tablet, TAKE 1 TABLET BY MOUTH EVERY DAY IN THE EVENING, Disp: 90 tablet, Rfl: 3   metFORMIN (GLUCOPHAGE) 500 MG tablet, TAKE 1 TABLET BY MOUTH EVERY DAY WITH BREAKFAST, Disp: 90 tablet, Rfl: 3   omeprazole (PRILOSEC) 20 MG capsule, Take 20 mg by mouth daily., Disp: , Rfl:    pregabalin (LYRICA) 200 MG capsule, Take 1 capsule (200 mg total) by mouth 2 (two) times daily., Disp: 180 capsule, Rfl: 3   tadalafil (CIALIS) 5 MG tablet, Take 5 mg by mouth daily., Disp: , Rfl:    topiramate (TOPAMAX) 25 MG tablet, Take 1 tablet (25 mg total) by mouth daily as needed., Disp: 60 tablet, Rfl: 3   valsartan (DIOVAN) 160 MG tablet,  Take 1 tablet (160 mg total) by mouth daily., Disp: 90 tablet, Rfl: 2  Allergies  Allergen Reactions   Sulfamethoxazole Other (See Comments)    other    Objective:   BP 112/64   Pulse 73   Temp (!) 97.5 F (36.4 C)   Ht 5' 9.45" (1.764 m)   Wt 236 lb 12.8 oz (107.4 kg)   SpO2 97%   BMI 34.52 kg/m      12/13/2023   11:48 AM 10/12/2023    9:19 AM 10/08/2023   10:00 AM  Vitals with BMI  Height 5' 9.45" 5' 9.5" 5' 9.5"  Weight 236 lbs 13 oz 243 lbs 10 oz 242 lbs  BMI 34.52 35.47 35.24  Systolic 112 130 737  Diastolic 64 72 78  Pulse 73 68 63     Physical Exam Vitals and nursing note reviewed.  Constitutional:      Appearance:  Normal appearance. He is normal weight.  HENT:     Head: Normocephalic and atraumatic.     Right Ear: Tympanic membrane, ear canal and external ear normal.     Left Ear: Tympanic membrane, ear canal and external ear normal.     Nose: Nose normal.     Mouth/Throat:     Mouth: Mucous membranes are moist.     Pharynx: Oropharynx is clear.  Musculoskeletal:     Cervical back: Normal range of motion and neck supple. No tenderness.  Lymphadenopathy:     Cervical: No cervical adenopathy.  Skin:    General: Skin is warm and dry.     Capillary Refill: Capillary refill takes less than 2 seconds.  Neurological:     General: No focal deficit present.     Mental Status: He is alert and oriented to person, place, and time. Mental status is at baseline.  Psychiatric:        Mood and Affect: Mood normal.        Behavior: Behavior normal.        Thought Content: Thought content normal.        Judgment: Judgment normal.     Assessment & Plan:  Dysphagia, unspecified type Assessment & Plan: Strep negative. No s/s of viral illness. No red flags on exam. GERD well controlled on Omeprazole. Will refer to ENT. Discussed s/s to seek emergent medical care.   Orders: -     Ambulatory referral to ENT  Pharyngitis, unspecified etiology -     Ambulatory referral to ENT     Follow up plan: Return if symptoms worsen or fail to improve.  Park Meo, FNP

## 2023-12-13 NOTE — Assessment & Plan Note (Addendum)
 Strep negative. No s/s of viral illness. No red flags on exam. GERD well controlled on Omeprazole. Will refer to ENT. Discussed s/s to seek emergent medical care.

## 2023-12-15 LAB — CULTURE, GROUP A STREP
Micro Number: 16313658
SPECIMEN QUALITY:: ADEQUATE

## 2023-12-15 LAB — STREP GROUP A AG, W/REFLEX TO CULT: Streptococcus Group A AG: NOT DETECTED

## 2024-01-01 ENCOUNTER — Other Ambulatory Visit: Payer: Self-pay | Admitting: Family Medicine

## 2024-01-01 ENCOUNTER — Other Ambulatory Visit: Payer: Self-pay | Admitting: Physical Medicine and Rehabilitation

## 2024-01-01 ENCOUNTER — Other Ambulatory Visit: Payer: Self-pay

## 2024-01-01 DIAGNOSIS — I10 Essential (primary) hypertension: Secondary | ICD-10-CM

## 2024-01-01 NOTE — Telephone Encounter (Signed)
 Prescription Request  01/01/2024  LOV: 10/12/23  What is the name of the medication or equipment? amLODipine  (NORVASC ) 5 MG tablet [[id number]]   Have you contacted your pharmacy to request a refill? Yes   Which pharmacy would you like this sent to?  [id number] Jonette Nestle, [place] - [address] [place] [address] Phone: [phone] Fax: [phone]    Patient notified that their request is being sent to the clinical staff for review and that they should receive a response within 2 business days.   Please advise at [facility] [phone]

## 2024-01-01 NOTE — Telephone Encounter (Signed)
 Prescription Request  01/01/2024  LOV: 10/12/23  What is the name of the medication or equipment? levocetirizine (XYZAL ) 5 MG tablet [[id number]]   Have you contacted your pharmacy to request a refill? Yes   Which pharmacy would you like this sent to?  [id number] Jonette Nestle, [place] - [address] [place] [address] Phone: [phone] Fax: [phone]    Patient notified that their request is being sent to the clinical staff for review and that they should receive a response within 2 business days.   Please advise at [facility] [phone]

## 2024-01-03 ENCOUNTER — Telehealth: Payer: Self-pay | Admitting: Family Medicine

## 2024-01-03 MED ORDER — AMLODIPINE BESYLATE 5 MG PO TABS: 5.0000 mg | ORAL_TABLET | Freq: Every day | ORAL | 1 refills | Status: DC

## 2024-01-03 MED ORDER — LEVOCETIRIZINE DIHYDROCHLORIDE 5 MG PO TABS: 5.0000 mg | ORAL_TABLET | Freq: Every evening | ORAL | 0 refills | Status: DC

## 2024-01-03 NOTE — Telephone Encounter (Signed)
Already ordered today

## 2024-01-03 NOTE — Telephone Encounter (Signed)
 Requested Prescriptions  Pending Prescriptions Disp Refills   amLODipine  (NORVASC ) 5 MG tablet 90 tablet 3    Sig: Take 1 tablet (5 mg total) by mouth daily.     Cardiovascular: Calcium  Channel Blockers 2 Failed - 01/03/2024  4:01 PM      Failed - Valid encounter within last 6 months    Recent Outpatient Visits           3 weeks ago Dysphagia, unspecified type   [place] [facility] Jenelle Mis, FNP   2 months ago Pure hypercholesterolemia   [place] [facility] Cheril Cork, Cisco Crest, MD   3 months ago Left sided sciatica   [place] [facility] Cheril Cork, Cisco Crest, MD   11 months ago Diverticulosis   [place] [facility] Jenelle Mis, FNP   11 months ago Ataxia   [place] [facility] Cheril Cork, Cisco Crest, MD              Passed - Last BP in normal range    BP Readings from Last 1 Encounters:  12/13/23 112/64         Passed - Last Heart Rate in normal range    Pulse Readings from Last 1 Encounters:  12/13/23 73          levocetirizine (XYZAL ) 5 MG tablet 90 tablet 3    Sig: Take 1 tablet (5 mg total) by mouth every evening.     Ear, Nose, and Throat:  Antihistamines - levocetirizine dihydrochloride  Passed - 01/03/2024  4:01 PM      Passed - Cr in normal range and within 360 days    Creat  Date Value Ref Range Status  10/12/2023 1.14 0.70 - 1.28 mg/dL Final         Passed - eGFR is 10 or above and within 360 days    GFR, Est African American  Date Value Ref Range Status  02/24/2021 79 > OR = 60 mL/min/1.22m2 Final   GFR, Est Non African American  Date Value Ref Range Status  02/24/2021 68 > OR = 60 mL/min/1.87m2 Final   GFR, Estimated  Date Value Ref Range Status  09/24/2022 >60 >60 mL/min Final    Comment:    (NOTE) Calculated using the CKD-EPI Creatinine Equation (2021)    GFR  Date Value Ref Range Status  12/19/2010 74.13 >60.00 mL/min Final   eGFR   Date Value Ref Range Status  10/12/2023 67 > OR = 60 mL/min/1.54m2 Final  11/13/2022 77 >59 mL/min/1.73 Final         Passed - Valid encounter within last 12 months    Recent Outpatient Visits           3 weeks ago Dysphagia, unspecified type   [place] [facility] Jenelle Mis, FNP   2 months ago Pure hypercholesterolemia   [place] [facility] Cheril Cork, Cisco Crest, MD   3 months ago Left sided sciatica   [place] [facility] Cheril Cork, Cisco Crest, MD   11 months ago Diverticulosis   [place] [facility] Jenelle Mis, FNP   11 months ago Ataxia   [place] [facility] [name], Cisco Crest, MD

## 2024-01-03 NOTE — Telephone Encounter (Signed)
 Prescription Request  01/03/2024  LOV: 10/12/2023  What is the name of the medication or equipment? levocetirizine (XYZAL ) 5 MG tablet   Have you contacted your pharmacy to request a refill? Yes   Which pharmacy would you like this sent to?  [id number] Jonette Nestle, [place] - [address] [place]  [address] Phone: [phone] Fax: [phone]    Patient notified that their request is being sent to the clinical staff for review and that they should receive a response within 2 business days.   Please advise at [facility] [phone]

## 2024-01-07 ENCOUNTER — Encounter
Payer: Medicare Other | Attending: Physical Medicine and Rehabilitation | Admitting: Physical Medicine and Rehabilitation

## 2024-01-07 ENCOUNTER — Encounter: Payer: Self-pay | Admitting: Physical Medicine and Rehabilitation

## 2024-01-07 VITALS — BP 131/83 | HR 71 | Ht 69.45 in | Wt 235.6 lb

## 2024-01-07 DIAGNOSIS — M2560 Stiffness of unspecified joint, not elsewhere classified: Secondary | ICD-10-CM | POA: Insufficient documentation

## 2024-01-07 DIAGNOSIS — R7303 Prediabetes: Secondary | ICD-10-CM | POA: Insufficient documentation

## 2024-01-07 DIAGNOSIS — G479 Sleep disorder, unspecified: Secondary | ICD-10-CM | POA: Diagnosis present

## 2024-01-07 DIAGNOSIS — G629 Polyneuropathy, unspecified: Secondary | ICD-10-CM | POA: Diagnosis present

## 2024-01-07 NOTE — Progress Notes (Signed)
 Subjective:    Patient ID: Robert Page, male    DOB: Aug 05, 1946, 78 y.o.   MRN: [id number]  HPI  1) Peripheral neuropathy: -getting worse- spreading further up the leg -hurts to walk -he is active every do Male with pmh/psh of polyneuropathy, atrial tachycardia, back surgery (?laminectomy L4-5), left foot surgery (tumor removal x4), dupuytens (palms and soles), HTN, GERD presents for follow-up of bilateral neuropathy.  Initially stated: Started summer 2000.  He had foot surgery in left foot in ~2016. Progressing.  Radiating proximally.  No symptoms in hands.  Ambulating and activity improves the pain. Shoes and inactivity exacerbate the pain. Hot and tingling/burning.  He notes I am the 6th doctor he's been to. He saw PCP and Ortho, who did MRI and told non-surgical.  He was referred to Rheum, who did not believe it was RA and thought it was L4-5 radic.  He was referred to Neurology, who ordered NCV/EMG (showing polyneuropathy, left peroneal neuropathy, Left L5/S1 radic) and MRI.  MRI C-spine showing mild disc bulges C3-6. He had MRI L-spine.  He saw Neurosurg, who did Myelogram and was told symptoms not originating in back.  Constant.  Associated numbness.  Gabapentin , Lyrica , Cymbalta  did not help. He was told he has tried "every single medication for neuropathy" without benefit. He notes improvement when he sleeps in a different bed.  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 Pain does not limit activities.   -he does not take methylfolate  -he does not think that the qutenza  patches helped  -lid leaked of the semglutide- he brought it to the pharamcy and they made half of it again for him, but it still leaked. He might have seen some results but was not sure if it was the medicine or the physical work he did outside this summer  -he is not sure if the medications are helping  -he asks if lyrica   can be increased  -neuropathy has been stable  Since that time, patient states he has not tried heat. He states he had burning with Capsacin for a few days but it does help for some time. He obtained Vitamin levels. Sleep has improved.  Patient states he was losing weight, but gained in on vacation. Patient states he often does not have time to take care of his body, take meds consistently, etc.   -He is taking Lyrica  and Cymbalta . He has tightness and stiffness in both lower extremities. He had a dose of steroid dose pack that did provide some relief to his back pain. If he is barefoot in the shower he feels off balance. He saw Dr. Rosebud Confer at [facility]. He has had prior foot surgery- was told he had arthritis. He saw rheumatology and was told he does not have rheumatoid arthritis. He had EMG/NCS (results above). -he asks for refill of Lyrica  and Cymbalta    Both feet are really stiff -he did not get enough benefit enough from Qutenza .  Discussed trying longer duration to 35 minutes today since he did not have any burning with prior treatment and he is agreeable to this Did not require ice packs during treatment today, but toward end of treatment developed burning on dorsum of right foot Pain can be very severe at night, this is when pain continues to be worse Needs medication refills today It negatively affects his sleep He feels burning pain  worst on tops of both feet but also present in soles He also has pain radiating from his back into his right shin He is ok on his medicines- no refills needed right now He is not sure if Lyrica  or Cymbalta  are helping. He will let us  know when he needs refills Neuropathy has been stable, he feels stiff and numbness in his feet He does not want to take things that are not needed -His wife has been diagnosed with lewy Body dementia -she was started on Trazodone.  -he does not use a heating pad -he has never tried metformin   2) Obesity: -patient  notes Wegovy  was denied and he asks about alternative options -he felt less hungry with the semalglutide.  - 4 lbs less than last visit -asks for medication that can help with weight loss  3) Muscle cramps: - he has been getting muscle cramps at night  4) Prediabetes -HgbA1c increased to 6.3 -gave up sweet tea -he asks if slaw is ok -tries to eat green beans  Pain Inventory Average Pain 5 Pain Right Now 5 My pain is burning at times, burning  In the last 24 hours, has pain interfered with the following? General activity 4 Relation with others 3 Enjoyment of life 7 What TIME of day is your pain at its worst?  evening Sleep (in general) Fair  Pain is worse with: sitting, standing, some activities Pain improves with: rest Relief from Meds: 3   Family History  Problem Relation Age of Onset   Hypertension Mother    Macular degeneration Mother    Kidney disease Mother    Dementia Mother    Hypertension Father    Melanoma Sister    Stroke Other        uncle   Prostate cancer Other        uncle   Colon cancer Other        grandmother/uncle   Kidney disease Other        grandmother   Liver cancer Other        uncle (mets)   Heart disease Other        aunt/uncle   Lung cancer Other        uncle   Stomach cancer Cousin    Heart disease Cousin    Colon cancer Paternal Grandmother    Colon cancer Paternal Uncle    Prostate cancer Paternal Uncle    Arthritis Brother    Hypertension Brother    Esophageal cancer Neg Hx    Rectal cancer Neg Hx    Social History   Socioeconomic History   Marital status: Married    Spouse name: Robert Page   Number of children: 3   Years of education: Not on file   Highest education level: Bachelor's degree (e.g., BA, AB, BS)  Occupational History   Occupation: Retired   Occupation: retired  Tobacco Use   Smoking status: Former    Types: Cigars    Quit date: 2015    Years since quitting: 10.3   Smokeless tobacco: Never    Tobacco comments:    Pt states smokes occasional cigar.  This is typically < 1 time per month  Vaping Use   Vaping status: Never Used  Substance and Sexual Activity   Alcohol use: No    Alcohol/week: 0.0 standard drinks of alcohol   Drug use: No   Sexual activity: Not on file  Other Topics Concern   Not on file  Social  History Narrative   Pt lives in [place].     Retired from [employer] and [employer].     Goes on mission trips to [place], gets regular exercise.   Caffeine use: very rare, Diet coke   Right handed    Social Drivers of Health   Financial Resource Strain: Low Risk  (09/17/2023)   Overall Financial Resource Strain (CARDIA)    Difficulty of Paying Living Expenses: Not hard at all  Food Insecurity: No Food Insecurity (09/17/2023)   Hunger Vital Sign    Worried About Running Out of Food in the Last Year: Never true    Ran Out of Food in the Last Year: Never true  Transportation Needs: No Transportation Needs (09/17/2023)   PRAPARE - Administrator, Civil Service (Medical): No    Lack of Transportation (Non-Medical): No  Physical Activity: Patient Declined (04/08/2023)   Exercise Vital Sign    Days of Exercise per Week: Patient declined    Minutes of Exercise per Session: Patient declined  Recent Concern: Physical Activity - Insufficiently Active (01/30/2023)   Exercise Vital Sign    Days of Exercise per Week: 3 days    Minutes of Exercise per Session: 20 min  Stress: No Stress Concern Present (09/17/2023)   [employer] of Occupational Health - Occupational Stress Questionnaire    Feeling of Stress : Not at all  Social Connections: Socially Integrated (09/17/2023)   Social Connection and Isolation Panel [NHANES]    Frequency of Communication with Friends and Family: More than three times a week    Frequency of Social Gatherings with Friends and Family: Three times a week    Attends Religious Services: More than 4 times per year    Active Member of  Clubs or Organizations: Yes    Attends Banker Meetings: More than 4 times per year    Marital Status: Married   Past Surgical History:  Procedure Laterality Date   BACK SURGERY     12/2013   CARDIAC CATHETERIZATION  1990's   COLECTOMY  2010   for diverticulitis   COLON SURGERY N/A    [facility] 08/27/2020   COLONOSCOPY  04/03/2013   LASIK  2002   Bil   POLYPECTOMY     Past Medical History:  Diagnosis Date   Adenomatous polyp    Allergy     Arthritis    Atrial tachycardia (HCC)    Coronary artery calcification seen on CT scan 06/26/2023   Coronary artery Ca2+ (CT in 12/2017) Myoview 10/29/12: EF 57, no ischemia     Diverticulosis    Dupuytren's disease    palms and soles   GERD (gastroesophageal reflux disease)    Hypertension    Stroke (HCC)    BP 131/83 (BP Location: Right Arm, Patient Position: Sitting, Cuff Size: Large)   Pulse 71   Ht 5' 9.45" (1.764 m)   Wt 235 lb 9.6 oz (106.9 kg)   SpO2 93%   BMI 34.34 kg/m   Opioid Risk Score:   Fall Risk Score:  `1  Depression screen [facility] 2/9     01/07/2024   10:36 AM 12/13/2023   11:59 AM 10/12/2023   11:28 AM 06/05/2023    9:13 AM 04/12/2023    1:00 PM 02/13/2023   10:46 AM 01/09/2023    2:51 PM  Depression screen PHQ 2/9  Decreased Interest 0 0 0 0 0 0 0  Down, Depressed, Hopeless 0 0 0 0 0 0 0  PHQ - 2 Score 0 0 0 0 0 0 0  Altered sleeping  0       Tired, decreased energy  1       Change in appetite  1       Feeling bad or failure about yourself   0       Trouble concentrating  0       Moving slowly or fidgety/restless  0       Suicidal thoughts  0       PHQ-9 Score  2       Difficult doing work/chores  Not difficult at all        Review of Systems  Constitutional: Negative.   HENT: Negative.    Eyes: Negative.   Respiratory: Negative.    Cardiovascular: Negative.   Gastrointestinal: Negative.   Endocrine: Negative.   Genitourinary: Negative.   Musculoskeletal:  Positive for gait problem.        Rt upper leg B/L lower leg  Skin: Negative.   Allergic/Immunologic: Negative.   Hematological: Negative.   Psychiatric/Behavioral: Negative.        Objective:   Physical Exam  PRIOR EXAM Gen: no distress, normal appearing, weight 235 lbs, BMI 35.29, BP 142/84 HEENT: oral mucosa pink and moist, NCAT Cardio: Reg rate Chest: normal effort, normal rate of breathing Abd: soft, non-distended Ext: no edema Psych: pleasant, normal affect Skin: intact Neuro: Alert and oriented x3    Assessment & Plan:  Male with pmh/psh ofpolyneuropathy, atrial tachycardia, back surgery (?laminectomy L4-5), left foot surgery (tumor removal x4), dupuytens (palms and soles), HTN, GERD presents for f/u b/l feet pain.  1. Chronic bilateral feet pain -   -continue topamax  25mg  daily and he is agreeable  MRI foot report unremarkable.  MRI back report showing ? L5-S1 radic  Recommended whole food rich diet.   Recommended B vitamin complex with methylfolate, discussed this can also help to prevent heart disease  NCS/EMG - showing polyneuropathy and L5-SI radic on left, results reviewed.   No benefit with Gabapentin   Trial Heat, reminded again  Will consider PT  Discussing purchasing TENS unit from [employer]  Trial OTC Lidocaine  patch  conitnue Lyrica  to 200mg  BID  Will consider referral to Neurosurg, pt would like to hold off at present  Will consider ESI, pt would like to hold off at present  Will consider PNS  Retry Capsacin, educated again  Continue Cymbalta   Continue lyrica    -will not repeat Qutenza  given lack of benefit and cost  Prescribed metformin  500mg  daily prn  Patient states main goal is to be pain free - improving  Educated on importance of caring for self. -Discussed current symptoms of pain and history of pain.  -Discussed benefits of exercise in reducing pain. -Discussed following foods that may reduce pain: 1) Ginger (especially studied for arthritis)- reduce leukotriene production to  decrease inflammation 2) Blueberries- high in phytonutrients that decrease inflammation 3) Salmon- marine omega-3s reduce joint swelling and pain 4) Pumpkin seeds- reduce inflammation 5) dark chocolate- reduces inflammation 6) turmeric- reduces inflammation 7) tart cherries - reduce pain and stiffness 8) extra virgin olive oil - its compound olecanthal helps to block prostaglandins  9) chili peppers- can be eaten or applied topically via capsaicin  10) mint- helpful for headache, muscle aches, joint pain, and itching 11) garlic- reduces inflammation  Link to further information on diet for chronic pain: [email or web address].[email or web address]    2. Sleep disturbance  -continue topamax  prn  -discussed  nasal strips and mouth taping  Discontinue amitriptyline   Discussed that Qutenza  has been particularly shown to help with painful neuropathy at night  Continue lyrica  to 200mg  BID -Try to go outside near sunrise -Get exercise during the day.  -Turn off all devices an hour before bedtime.  -Teas that can benefit: chamomile, valerian root, Brahmi (Bacopa) -Can consider over the counter melatonin, magnesium, and/or L-theanine. Melatonin is an anti-oxidant with multiple health benefits. Magnesium is involved in greater than 300 enzymatic reactions in the body and most of us  are deficient as our soil is often depleted. There are 7 different types of magnesium- Bioptemizer's is a supplement with all 7 types, and each has unique benefits. Magnesium can also help with constipation and anxiety.  -Pistachios naturally increase the production of melatonin -Cozy Earth bamboo bed sheets are free from toxic chemicals.  -Tart cherry juice or a tart cherry supplement can improve sleep and soreness post-workout   Continue tylenol  PM  3. Obesity, BMI 34.18, weight is 239 lbs  -topamx 25mg  daily prescribed, continue  Discussed sleep apnea  testing  Discussed weight is stable from last visit  Discussed that [facility] was denied  Discussed risks and benefits of Contrave, Qysimia, and compounded semglutide and patient chose latter  Not interested in seeing dietitian  Educated again  Discussed following criteria for Wegovy : 1) Patient has diagnosis of obesity; 2) Patient must be 61 years of age or older; 3) The patient has been involved in a physician or dietitian monitored weight loss program, consisting of both low-calorie diet, increased physical activity and behavioral counseling for a minimum of 6 months without a 3% loss from baseline; 4) Patients BMI is one of the following: a) 30kg/m or greater; b) 27 29.99 kg/m in the presence of at least one weight related comorbidity such as coronary heart disease, dyslipidemia, hypertension, symptomatic arthritis of the lower extremities, type 2 diabetes mellitus, obstructive sleep apnea; c) 25-29.9 kg/m2 and waist circumference is &gt; 40 inches for males or &gt; 35 inches for females;5) Not Met - Must have tried and failed at least one preferred oral agent such as Phentermine; 6) Patient has no labeled contraindication; 7) Patient is not taking another weight loss medication; 8) The dose is within approved product labeling guidelines; 10) The patient must not be taking another GLP-1 receptor agonist AND the patient must not be taking insulin concurrently.   -prescribed Wegovy  -recommended resistance training and prioritizing protein intake to avoid muscle loss while taking this medication -discussed risks of thyroid  cancer and gastroparesis   4. Myalgia  D/c robaxin   Lipid panel reviewed and normal  5. Prediabetes: -discussed that HgbA1c increased to 6.3, repeat today -discussed stopping soda and sweet tea -discussed making own sweet tea at home with black tea and adding honey or allulose as sweeteners if he feels he needs sweetener -metformin  500mg  daily prn ordered, discussed risks and  benefits   -discussed diet  -discussed that he does not use a lot of sugar -try to incorporate into your diet some of the following foods which are good for diabetes: 1) cinnamon- imitates effects of insulin, increasing glucose transport into cells ([place] or [place] cinnamon is best, least processed) 2) nuts- can slow down the blood sugar response of carbohydrate rich foods 3) oatmeal- contains and anti-inflammatory compound avenanthramide 4) whole-milk yogurt (best types are no sugar, [place] yogurt, or goat/sheep yogurt) 5) beans- high in protein, fiber, and vitamins, low glycemic index 6) broccoli- great source of vitamin A  and C 7) quinoa- higher in protein and fiber than other grains 8) spinach- high in vitamin A, fiber, and protein 9) olive oil- reduces glucose levels, LDL, and triglycerides 10) salmon- excellent amount of omega-3-fatty acids 11) walnuts- rich in antioxidants 12) apples- high in fiber and quercetin 13) carrots- highly nutritious with low impact on blood sugar 14) eggs- improve HDL (good cholesterol), high in protein, keep you satiated 15) turmeric: improves blood sugars, cardiovascular disease, and protects kidney health 16) garlic: improves blood sugar, blood pressure, pain 17) tomatoes: highly nutritious with low impact on blood sugar   6) HTN: -check BMP and magnesium level -discussed trying to minimize stress from his wife's diagnosis -discussed that he has a lot of support from his two loving daughters and son  7) leg swelling:  -recommended frequent rest breaks when driving  8) History of CVA: -recommended methylated B vitamin -continue green beans and garden peas  9) Fatigue:  -recommended avoiding visceral fat in food -discussed checking testosterone  level -discussed vitamin D is probably fine   10) muscle cramps: -recommended magnesium glycinate 250mg  HS, discussed this can also help with sleep.   11) Chronic wound: -discussed applying  manuka honey onto wound -discussed red light therapy  12) Frequent falls: -discussed that he fell a few times in the past few months and feels it was because he was not picking his feet up

## 2024-01-07 NOTE — Patient Instructions (Signed)
 Diabetes: Foods which are good for diabetes: 1) cinnamon- imitates effects of insulin, increasing glucose transport into cells ([place] or [place] cinnamon is best, least processed) 2) nuts- can slow down the blood sugar response of carbohydrate rich foods 3) oatmeal- contains and anti-inflammatory compound avenanthramide 4) whole-milk yogurt (best types are no sugar, [place] yogurt, or goat/sheep yogurt) 5) beans- high in protein, fiber, and vitamins, low glycemic index 6) broccoli- great source of vitamin A and C 7) quinoa- higher in protein and fiber than other grains 8) spinach- high in vitamin A, fiber, and protein 9) olive oil- reduces glucose levels, LDL, and triglycerides 10) salmon- excellent amount of omega-3-fatty acids 11) walnuts- rich in antioxidants 12) apples- high in fiber and quercetin 13) carrots- highly nutritious with low impact on blood sugar 14) eggs- improve HDL (good cholesterol), high in protein, keep you satiated 15) turmeric: improves blood sugars, cardiovascular disease, and protects kidney health 16) garlic: improves blood sugar, blood pressure, pain 17) tomatoes: highly nutritious with low impact on blood sugar

## 2024-01-08 LAB — HEMOGLOBIN A1C
Est. average glucose Bld gHb Est-mCnc: 128 mg/dL
Hgb A1c MFr Bld: 6.1 % — ABNORMAL HIGH (ref 4.8–5.6)

## 2024-01-15 ENCOUNTER — Encounter (HOSPITAL_BASED_OUTPATIENT_CLINIC_OR_DEPARTMENT_OTHER): Admitting: Physical Medicine and Rehabilitation

## 2024-01-15 DIAGNOSIS — G629 Polyneuropathy, unspecified: Secondary | ICD-10-CM

## 2024-01-15 DIAGNOSIS — R7303 Prediabetes: Secondary | ICD-10-CM

## 2024-01-15 MED ORDER — METFORMIN HCL 500 MG PO TABS: 500.0000 mg | ORAL_TABLET | Freq: Two times a day (BID) | ORAL | 3 refills | Status: DC

## 2024-01-15 NOTE — Progress Notes (Signed)
 Subjective:    Patient ID: Robert Page, male    DOB: [birth year], 78 y.o.   MRN: [id number]  HPI An audio/video tele-health visit is felt to be the most appropriate encounter for this patient at this time. This is a follow up tele-visit via phone. The patient is at home. MD is at office. Prior to scheduling this appointment, our staff discussed the limitations of evaluation and management by telemedicine and the availability of in-person appointments. The patient expressed understanding and agreed to proceed.   1) Peripheral neuropathy 2/2 prediabetes -getting worse- spreading further up the leg- continues -takes metformin  500mg  daily, would like to try increasing to BID -HgbA1c improved to 6.1 -hurts to walk -he is active every do Male with pmh/psh of polyneuropathy, atrial tachycardia, back surgery (?laminectomy L4-5), left foot surgery (tumor removal x4), dupuytens (palms and soles), HTN, GERD presents for follow-up of bilateral neuropathy.  Initially stated: Started summer 2000.  He had foot surgery in left foot in ~2016. Progressing.  Radiating proximally.  No symptoms in hands.  Ambulating and activity improves the pain. Shoes and inactivity exacerbate the pain. Hot and tingling/burning.  He notes I am the 6th doctor he's been to. He saw PCP and Ortho, who did MRI and told non-surgical.  He was referred to Rheum, who did not believe it was RA and thought it was L4-5 radic.  He was referred to Neurology, who ordered NCV/EMG (showing polyneuropathy, left peroneal neuropathy, Left L5/S1 radic) and MRI.  MRI C-spine showing mild disc bulges C3-6. He had MRI L-spine.  He saw Neurosurg, who did Myelogram and was told symptoms not originating in back.  Constant.  Associated numbness.  Gabapentin , Lyrica , Cymbalta  did not help. He was told he has tried "every single medication for neuropathy" without benefit. He notes improvement when he sleeps in a different bed.  Had a fall doing some work with  trees. 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 Pain does not limit activities.   -he does not take methylfolate  -he does not think that the qutenza  patches helped  -lid leaked of the semglutide- he brought it to the pharamcy and they made half of it again for him, but it still leaked. He might have seen some results but was not sure if it was the medicine or the physical work he did outside this summer  -he is not sure if the medications are helping  -he asks if lyrica  can be increased  -neuropathy has been stable  Since that time, patient states he has not tried heat. He states he had burning with Capsacin for a few days but it does help for some time. He obtained Vitamin levels. Sleep has improved.  Patient states he was losing weight, but gained in on vacation. Patient states he often does not have time to take care of his body, take meds consistently, etc.   -He is taking Lyrica  and Cymbalta . He has tightness and stiffness in both lower extremities. He had a dose of steroid dose pack that did provide some relief to his back pain. If he is barefoot in the shower he feels off balance. He saw Dr. Rosebud Confer at [facility]. He has had prior foot surgery- was told he had arthritis. He saw rheumatology and was told he does not have rheumatoid arthritis. He had EMG/NCS (results above). -he asks for refill of Lyrica  and Cymbalta    Both feet are  really stiff -he did not get enough benefit enough from Qutenza .  Discussed trying longer duration to 35 minutes today since he did not have any burning with prior treatment and he is agreeable to this Did not require ice packs during treatment today, but toward end of treatment developed burning on dorsum of right foot Pain can be very severe at night, this is when pain continues to be worse Needs medication refills today It negatively affects his sleep He feels burning pain worst on tops of  both feet but also present in soles He also has pain radiating from his back into his right shin He is ok on his medicines- no refills needed right now He is not sure if Lyrica  or Cymbalta  are helping. He will let us  know when he needs refills Neuropathy has been stable, he feels stiff and numbness in his feet He does not want to take things that are not needed -His wife has been diagnosed with lewy Body dementia -she was started on Trazodone.  -he does not use a heating pad -he has never tried metformin   2) Obesity: -patient notes Wegovy  was denied and he asks about alternative options -he felt less hungry with the semalglutide.  - 4 lbs less than last visit -asks for medication that can help with weight loss  3) Muscle cramps: - he has been getting muscle cramps at night  4) Prediabetes -HgbA1c increased to 6.3 -gave up sweet tea -he asks if slaw is ok -tries to eat green beans  Pain Inventory Average Pain 5 Pain Right Now 5 My pain is burning at times, burning  In the last 24 hours, has pain interfered with the following? General activity 4 Relation with others 3 Enjoyment of life 7 What TIME of day is your pain at its worst?  evening Sleep (in general) Fair  Pain is worse with: sitting, standing, some activities Pain improves with: rest Relief from Meds: 3   Family History  Problem Relation Age of Onset   Hypertension Mother    Macular degeneration Mother    Kidney disease Mother    Dementia Mother    Hypertension Father    Melanoma Sister    Stroke Other        uncle   Prostate cancer Other        uncle   Colon cancer Other        grandmother/uncle   Kidney disease Other        grandmother   Liver cancer Other        uncle (mets)   Heart disease Other        aunt/uncle   Lung cancer Other        uncle   Stomach cancer Cousin    Heart disease Cousin    Colon cancer Paternal Grandmother    Colon cancer Paternal Uncle    Prostate cancer Paternal  Uncle    Arthritis Brother    Hypertension Brother    Esophageal cancer Neg Hx    Rectal cancer Neg Hx    Social History   Socioeconomic History   Marital status: Married    Spouse name: Robert Gardocki   Number of children: 3   Years of education: Not on file   Highest education level: Bachelor's degree (e.g., BA, AB, BS)  Occupational History   Occupation: Retired   Occupation: retired  Tobacco Use   Smoking status: Former    Types: Cigars    Quit date: 2015  Years since quitting: 10.3   Smokeless tobacco: Never   Tobacco comments:    Pt states smokes occasional cigar.  This is typically < 1 time per month  Vaping Use   Vaping status: Never Used  Substance and Sexual Activity   Alcohol use: No    Alcohol/week: 0.0 standard drinks of alcohol   Drug use: No   Sexual activity: Not on file  Other Topics Concern   Not on file  Social History Narrative   Pt lives in [place].     Retired from [employer] and [employer].     Goes on mission trips to [place], gets regular exercise.   Caffeine use: very rare, Diet coke   Right handed    Social Drivers of Health   Financial Resource Strain: Low Risk  (09/17/2023)   Overall Financial Resource Strain (CARDIA)    Difficulty of Paying Living Expenses: Not hard at all  Food Insecurity: No Food Insecurity (09/17/2023)   Hunger Vital Sign    Worried About Running Out of Food in the Last Year: Never true    Ran Out of Food in the Last Year: Never true  Transportation Needs: No Transportation Needs (09/17/2023)   PRAPARE - Administrator, Civil Service (Medical): No    Lack of Transportation (Non-Medical): No  Physical Activity: Patient Declined (04/08/2023)   Exercise Vital Sign    Days of Exercise per Week: Patient declined    Minutes of Exercise per Session: Patient declined  Recent Concern: Physical Activity - Insufficiently Active (01/30/2023)   Exercise Vital Sign    Days of Exercise per Week: 3 days    Minutes of  Exercise per Session: 20 min  Stress: No Stress Concern Present (09/17/2023)   [employer] of Occupational Health - Occupational Stress Questionnaire    Feeling of Stress : Not at all  Social Connections: Socially Integrated (09/17/2023)   Social Connection and Isolation Panel [NHANES]    Frequency of Communication with Friends and Family: More than three times a week    Frequency of Social Gatherings with Friends and Family: Three times a week    Attends Religious Services: More than 4 times per year    Active Member of Clubs or Organizations: Yes    Attends Banker Meetings: More than 4 times per year    Marital Status: Married   Past Surgical History:  Procedure Laterality Date   BACK SURGERY     12/2013   CARDIAC CATHETERIZATION  1990's   COLECTOMY  2010   for diverticulitis   COLON SURGERY N/A    [facility] 08/27/2020   COLONOSCOPY  04/03/2013   LASIK  2002   Bil   POLYPECTOMY     Past Medical History:  Diagnosis Date   Adenomatous polyp    Allergy     Arthritis    Atrial tachycardia (HCC)    Coronary artery calcification seen on CT scan 06/26/2023   Coronary artery Ca2+ (CT in 12/2017) Myoview 10/29/12: EF 57, no ischemia     Diverticulosis    Dupuytren's disease    palms and soles   GERD (gastroesophageal reflux disease)    Hypertension    Stroke (HCC)    There were no vitals taken for this visit.  Opioid Risk Score:   Fall Risk Score:  `1  Depression screen [facility] 2/9     01/07/2024   10:36 AM 12/13/2023   11:59 AM 10/12/2023   11:28 AM 06/05/2023  9:13 AM 04/12/2023    1:00 PM 02/13/2023   10:46 AM 01/09/2023    2:51 PM  Depression screen PHQ 2/9  Decreased Interest 0 0 0 0 0 0 0  Down, Depressed, Hopeless 0 0 0 0 0 0 0  PHQ - 2 Score 0 0 0 0 0 0 0  Altered sleeping  0       Tired, decreased energy  1       Change in appetite  1       Feeling bad or failure about yourself   0       Trouble concentrating  0       Moving slowly or  fidgety/restless  0       Suicidal thoughts  0       PHQ-9 Score  2       Difficult doing work/chores  Not difficult at all        Review of Systems  Constitutional: Negative.   HENT: Negative.    Eyes: Negative.   Respiratory: Negative.    Cardiovascular: Negative.   Gastrointestinal: Negative.   Endocrine: Negative.   Genitourinary: Negative.   Musculoskeletal:  Positive for gait problem.       Rt upper leg B/L lower leg  Skin: Negative.   Allergic/Immunologic: Negative.   Hematological: Negative.   Psychiatric/Behavioral: Negative.        Objective:   Physical Exam  PRIOR EXAM Gen: no distress, normal appearing, weight 235 lbs, BMI 35.29, BP 142/84 HEENT: oral mucosa pink and moist, NCAT Cardio: Reg rate Chest: normal effort, normal rate of breathing Abd: soft, non-distended Ext: no edema Psych: pleasant, normal affect Skin: intact Neuro: Alert and oriented x3    Assessment & Plan:  Male with pmh/psh ofpolyneuropathy, atrial tachycardia, back surgery (?laminectomy L4-5), left foot surgery (tumor removal x4), dupuytens (palms and soles), HTN, GERD presents for f/u b/l feet pain.  1. 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  MRI back report showing ? L5-S1 radic  Recommended whole food rich diet.   Recommended B vitamin complex with methylfolate, discussed this can also help to prevent heart disease  NCS/EMG - showing polyneuropathy and L5-SI radic on left, results reviewed.   No benefit with Gabapentin   Trial Heat, reminded again  Will consider PT  Discussing purchasing TENS unit from [facility]  Trial OTC Lidocaine  patch  conitnue Lyrica  to 200mg  BID  Will consider referral to Neurosurg, pt would like  to hold off at present  Will consider ESI, pt would like to hold off at present  Will consider PNS  Retry Capsacin, educated again  Continue Cymbalta   Continue lyrica    -will not repeat Qutenza  given lack of benefit and cost  Prescribed metformin  500mg  daily prn  Patient states main goal is to be pain free - improving  Educated on importance of caring for self. -Discussed current symptoms of pain and history of pain.  -Discussed benefits of exercise in reducing pain. -Discussed following foods that may reduce pain: 1) Ginger (especially studied for arthritis)- reduce leukotriene production to decrease inflammation 2) Blueberries- high in phytonutrients that decrease inflammation 3) Salmon- marine omega-3s reduce joint swelling and pain 4) Pumpkin seeds- reduce inflammation 5) dark chocolate- reduces inflammation  6) turmeric- reduces inflammation 7) tart cherries - reduce pain and stiffness 8) extra virgin olive oil - its compound olecanthal helps to block prostaglandins  9) chili peppers- can be eaten or applied topically via capsaicin  10) mint- helpful for headache, muscle aches, joint pain, and itching 11) garlic- reduces inflammation  Link to further information on diet for chronic pain: http://[email or web address].[email or web address]/    2. Sleep disturbance  -continue topamax  prn  -discussed nasal strips and mouth taping  Discontinue amitriptyline   Discussed that Qutenza  has been particularly shown to help with painful neuropathy at night  Continue lyrica  to 200mg  BID -Try to go outside near sunrise -Get exercise during the day.  -Turn off all devices an hour before bedtime.  -Teas that can benefit: chamomile, valerian root, Brahmi (Bacopa) -Can consider over the counter melatonin, magnesium, and/or L-theanine. Melatonin is an anti-oxidant with multiple health benefits. Magnesium is involved in greater than 300 enzymatic reactions  in the body and most of us  are deficient as our soil is often depleted. There are 7 different types of magnesium- Bioptemizer's is a supplement with all 7 types, and each has unique benefits. Magnesium can also help with constipation and anxiety.  -Pistachios naturally increase the production of melatonin -Cozy Earth bamboo bed sheets are free from toxic chemicals.  -Tart cherry juice or a tart cherry supplement can improve sleep and soreness post-workout   Continue tylenol  PM  3. Obesity, BMI 34.18, weight is 239 lbs  -topamx 25mg  daily prescribed, continue  Discussed sleep apnea testing  Discussed weight is stable from last visit  Discussed that [facility] was denied  Discussed risks and benefits of Contrave, Qysimia, and compounded semglutide and patient chose latter  Not interested in seeing dietitian  Educated again  Discussed following criteria for Wegovy : 1) Patient has diagnosis of obesity; 2) Patient must be 7 years of age or older; 3) The patient has been involved in a physician or dietitian monitored weight loss program, consisting of both low-calorie diet, increased physical activity and behavioral counseling for a minimum of 6 months without a 3% loss from baseline; 4) Patients BMI is one of the following: a) 30kg/m or greater; b) 27 29.99 kg/m in the presence of at least one weight related comorbidity such as coronary heart disease, dyslipidemia, hypertension, symptomatic arthritis of the lower extremities, type 2 diabetes mellitus, obstructive sleep apnea; c) 25-29.9 kg/m2 and waist circumference is &gt; 40 inches for males or &gt; 35 inches for females;5) Not Met - Must have tried and failed at least one preferred oral agent such as Phentermine; 6) Patient has no labeled contraindication; 7) Patient is not taking another weight loss medication; 8) The dose is within approved product labeling guidelines; 10) The patient must not be taking another GLP-1 receptor agonist AND the patient must  not be taking insulin concurrently.   -prescribed Wegovy  -recommended resistance training and prioritizing protein intake to avoid muscle loss while taking this medication -discussed risks of thyroid  cancer and gastroparesis   4. Myalgia  D/c robaxin   Lipid panel reviewed and normal  5. Prediabetes: -discussed that HgbA1c increased to 6.3, repeat today -discussed stopping soda and sweet tea -discussed making own sweet tea at home with black tea and adding honey or allulose as sweeteners if he feels he needs sweetener -metformin  500mg  daily prn ordered, discussed risks and benefits   -discussed diet  -discussed that he does not use a lot of sugar -try to incorporate into your diet some of the following foods  which are good for diabetes: 1) cinnamon- imitates effects of insulin, increasing glucose transport into cells ([place] or [place] cinnamon is best, least processed) 2) nuts- can slow down the blood sugar response of carbohydrate rich foods 3) oatmeal- contains and anti-inflammatory compound avenanthramide 4) whole-milk yogurt (best types are no sugar, [place] yogurt, or goat/sheep yogurt) 5) beans- high in protein, fiber, and vitamins, low glycemic index 6) broccoli- great source of vitamin A and C 7) quinoa- higher in protein and fiber than other grains 8) spinach- high in vitamin A, fiber, and protein 9) olive oil- reduces glucose levels, LDL, and triglycerides 10) salmon- excellent amount of omega-3-fatty acids 11) walnuts- rich in antioxidants 12) apples- high in fiber and quercetin 13) carrots- highly nutritious with low impact on blood sugar 14) eggs- improve HDL (good cholesterol), high in protein, keep you satiated 15) turmeric: improves blood sugars, cardiovascular disease, and protects kidney health 16) garlic: improves blood sugar, blood pressure, pain 17) tomatoes: highly nutritious with low impact on blood sugar   6) HTN: -check BMP and magnesium  level -discussed trying to minimize stress from his wife's diagnosis -discussed that he has a lot of support from his two loving daughters and son  7) leg swelling:  -recommended frequent rest breaks when driving  8) History of CVA: -recommended methylated B vitamin -continue green beans and garden peas  9) Fatigue:  -recommended avoiding visceral fat in food -discussed checking testosterone  level -discussed vitamin D is probably fine   10) muscle cramps: -recommended magnesium glycinate 250mg  HS, discussed this can also help with sleep.   11) Chronic wound: -discussed applying manuka honey onto wound -discussed red light therapy  12) Frequent falls: -discussed that he fell a few times in the past few months and feels it was because he was not picking his feet up  5 minutes spent in discussion of HgbA1c improving to 6.1, increasing metformin  to 500mg  BID since neuropathy is worsening.

## 2024-02-21 ENCOUNTER — Encounter (INDEPENDENT_AMBULATORY_CARE_PROVIDER_SITE_OTHER): Payer: Self-pay | Admitting: Otolaryngology

## 2024-02-21 ENCOUNTER — Ambulatory Visit (INDEPENDENT_AMBULATORY_CARE_PROVIDER_SITE_OTHER): Admitting: Otolaryngology

## 2024-02-21 VITALS — BP 114/68 | HR 66 | Ht 69.5 in | Wt 235.0 lb

## 2024-02-21 DIAGNOSIS — K219 Gastro-esophageal reflux disease without esophagitis: Secondary | ICD-10-CM

## 2024-02-21 DIAGNOSIS — R1314 Dysphagia, pharyngoesophageal phase: Secondary | ICD-10-CM | POA: Diagnosis not present

## 2024-02-21 NOTE — Progress Notes (Signed)
 Dear Dr. Gwen Lek, Here is my assessment for our mutual patient, Robert Page. Thank you for allowing me the opportunity to care for your patient. Please do not hesitate to contact me should you have any other questions. Sincerely, Dr. Milon Aloe  Otolaryngology Clinic Note Referring provider: Dr. Gwen Lek HPI:  Robert Page is a 78 y.o. male kindly referred by Dr. Gwen Lek for evaluation of dysphagia.  Initial visit (02/2024): Patient reports: several year history of dysphagia, particularly with solid foods (fries, potatoes, salad). He will sometimes try to cough and get it up but no regurgitation. It does sometimes irritates him. No worse. Just has to be careful eating certain foods. No coughing with eating. No regurgitation, bad breath. Has seen GI before, and underwent EGD/Dilation in 2024.  Does not drink water with his food.  No recent intubations. Denies GERD sx including belching, epigastric discomfort. Uses PPI.  Patient otherwise denies: - odynophagia, aspiration episodes or PNA, need for Heimlich, unintentional weight loss - changes in voice, shortness of breath, hemoptysis - ear pain, neck masses  H&N Surgery: no Personal or FHx of bleeding dz or anesthesia difficulty: no  AP/AC: ASA 81  Tobacco: rare cigar use, quit 15 years ago. Alcohol: no  PMHx: HLD, HTN, A-fib, Neuropathy, GERD, PreDM, h/o CVA  Independent Review of Additional Tests or Records:  Yolanda Hence (12/13/2023): noted sore throat and difficulty eating; no other sx; no difficulty swallowing; intermittent dysphonia; food sticking; prior seen GI; Dx: Dysphagia; Pharyngitis; Rx: ref to ENT Labs: CBC 10/12/2023: WBC 5.6 Colleen Kennedy-Smith (04/13/2023) GI: noted difficulty swallowing with food sticking if eating bread; prior ENT eval unrevealing; some cough/gagging, no regurg; om PPI; Dx: Dysphagia and GERD; Rx: EGD CT Chest 09/28/2022: patent airway; noted stable mild thyroid  goiter and right thyroid  nodule. EGD  06/19/2023:   PMH/Meds/All/SocHx/FamHx/ROS:   Past Medical History:  Diagnosis Date   Adenomatous polyp    Allergy     Arthritis    Atrial tachycardia (HCC)    Coronary artery calcification seen on CT scan 06/26/2023   Coronary artery Ca2+ (CT in 12/2017) Myoview 10/29/12: EF 57, no ischemia     Diverticulosis    Dupuytren's disease    palms and soles   GERD (gastroesophageal reflux disease)    Hypertension    Stroke [facility])      Past Surgical History:  Procedure Laterality Date   BACK SURGERY     12/2013   CARDIAC CATHETERIZATION  1990's   COLECTOMY  2010   for diverticulitis   COLON SURGERY N/A    [employer] 08/27/2020   COLONOSCOPY  04/03/2013   LASIK  2002   Bil   POLYPECTOMY      Family History  Problem Relation Age of Onset   Hypertension Mother    Macular degeneration Mother    Kidney disease Mother    Dementia Mother    Hypertension Father    Melanoma Sister    Stroke Other        uncle   Prostate cancer Other        uncle   Colon cancer Other        grandmother/uncle   Kidney disease Other        grandmother   Liver cancer Other        uncle (mets)   Heart disease Other        aunt/uncle   Lung cancer Other        uncle   Stomach cancer Cousin  Heart disease Cousin    Colon cancer Paternal Grandmother    Colon cancer Paternal Uncle    Prostate cancer Paternal Uncle    Arthritis Brother    Hypertension Brother    Esophageal cancer Neg Hx    Rectal cancer Neg Hx      Social Connections: Socially Integrated (09/17/2023)   Social Connection and Isolation Panel    Frequency of Communication with Friends and Family: More than three times a week    Frequency of Social Gatherings with Friends and Family: Three times a week    Attends Religious Services: More than 4 times per year    Active Member of Clubs or Organizations: Yes    Attends Banker Meetings: More than 4 times per year    Marital Status: Married      Current  Outpatient Medications:    amLODipine  (NORVASC ) 5 MG tablet, Take 1 tablet (5 mg total) by mouth daily., Disp: 90 tablet, Rfl: 1   aspirin EC 81 MG tablet, Take 81 mg by mouth daily., Disp: , Rfl:    DULoxetine  (CYMBALTA ) 60 MG capsule, TAKE 1 CAPSULE BY MOUTH EVERY DAY, Disp: 90 capsule, Rfl: 3   ezetimibe  (ZETIA ) 10 MG tablet, Take 1 tablet (10 mg total) by mouth daily., Disp: 90 tablet, Rfl: 3   fluticasone  (FLONASE ) 50 MCG/ACT nasal spray, Nasal for 90, Disp: , Rfl:    levocetirizine (XYZAL ) 5 MG tablet, Take 1 tablet (5 mg total) by mouth every evening., Disp: 90 tablet, Rfl: 0   metFORMIN  (GLUCOPHAGE ) 500 MG tablet, Take 1 tablet (500 mg total) by mouth 2 (two) times daily with a meal., Disp: 90 tablet, Rfl: 3   omeprazole (PRILOSEC) 20 MG capsule, Take 20 mg by mouth daily., Disp: , Rfl:    pregabalin  (LYRICA ) 200 MG capsule, TAKE 1 CAPSULE BY MOUTH TWICE A DAY, Disp: 180 capsule, Rfl: 3   tadalafil (CIALIS) 5 MG tablet, Take 5 mg by mouth daily., Disp: , Rfl:    topiramate  (TOPAMAX ) 25 MG tablet, Take 1 tablet (25 mg total) by mouth daily as needed., Disp: 60 tablet, Rfl: 3   valsartan  (DIOVAN ) 160 MG tablet, TAKE 1 TABLET BY MOUTH EVERY DAY, Disp: 90 tablet, Rfl: 2   Physical Exam:   BP 114/68 (BP Location: Left Arm, Patient Position: Sitting, Cuff Size: Large)   Pulse 66   Ht 5' 9.5 (1.765 m)   Wt 235 lb (106.6 kg)   SpO2 93%   BMI 34.21 kg/m   Salient findings:  CN II-XII intact  Bilateral EAC clear and TM intact with well pneumatized middle ear spaces Anterior rhinoscopy: Septum relatively midline; bilateral inferior turbinates without significant hypertrophy No lesions of oral cavity/oropharynx; tonsils 1/1, normal in appearance No obviously palpable neck masses/lymphadenopathy/thyromegaly No respiratory distress or stridor  Seprately Identifiable Procedures:  Prior to initiating any procedures, risks/benefits/alternatives were explained to the patient and verbal consent  obtained. Procedure Note Pre-procedure diagnosis:  Dysphagia, GERD Post-procedure diagnosis: Same Procedure: Transnasal Fiberoptic Laryngoscopy, CPT 31575 - Mod 25 Indication: see above Complications: None apparent EBL: 0 mL  The procedure was undertaken to further evaluate the patient's complaint above, with mirror exam inadequate for appropriate examination due to gag reflex and poor patient tolerance  Procedure:  Patient was identified as correct patient. Verbal consent was obtained. The nose was sprayed with oxymetazoline and 4% lidocaine . The The flexible laryngoscope was passed through the nose to view the nasal cavity, pharynx (oropharynx, hypopharynx) and larynx.  The larynx was examined at rest and during multiple phonatory tasks. Documentation was obtained and reviewed with patient. The scope was removed. The patient tolerated the procedure well.  Findings: The nasal cavity and nasopharynx did not reveal any masses or lesions, mucosa appeared to be without obvious lesions. The tongue base, pharyngeal walls, piriform sinuses, vallecula, epiglottis and postcricoid region are normal in appearance without significant retained secretions. The visualized portion of the subglottis and proximal trachea is widely patent. The vocal folds are mobile bilaterally. There are no lesions on the free edge of the vocal folds nor elsewhere in the larynx worrisome for malignancy.           Electronically signed by: Evelina Hippo, MD 02/21/2024 1:22 PM   Impression & Plans:  Orson Rho is a 78 y.o. male with:  1. Pharyngoesophageal dysphagia   2. Gastroesophageal reflux disease without esophagitis    Prior EGD recently without strictures. Mostly with solid foods/particular foods. At this point, do not suspect stricture but referred sensation. Regardless, will assess biomechanics of swallowing to ensure he is swallowing safely. He is in agreement - MBS and Esophagram - Drink liquids when  eating solids - GERD: stable, continue PPI; denies GERD sx  - f/u 8 weeks   See below regarding exact medications prescribed this encounter including dosages and route: No orders of the defined types were placed in this encounter.     Thank you for allowing me the opportunity to care for your patient. Please do not hesitate to contact me should you have any other questions.  Sincerely, Milon Aloe, MD Otolaryngologist (ENT), [facility] Phone: [phone] Fax: [phone]  02/21/2024, 1:22 PM   MDM:  Level 4 - 99204 Complexity/Problems addressed: mod - chronic problems Data complexity: mod - independent review of notes, labs, ordering testing - Morbidity: low currently  - Prescription Drug prescribed or managed: no

## 2024-02-21 NOTE — Patient Instructions (Signed)
 I have ordered an imaging study for you to complete prior to your next visit. Please call [facility] Scheduling at [phone] to schedule your imaging if you have not received a call within 24 hours. If you are unable to complete your imaging study prior to your next scheduled visit please call our office to let us know.

## 2024-02-22 ENCOUNTER — Telehealth (HOSPITAL_COMMUNITY): Payer: Self-pay | Admitting: *Deleted

## 2024-02-22 ENCOUNTER — Other Ambulatory Visit (HOSPITAL_COMMUNITY): Payer: Self-pay | Admitting: Otolaryngology

## 2024-02-22 DIAGNOSIS — R059 Cough, unspecified: Secondary | ICD-10-CM

## 2024-02-22 DIAGNOSIS — R1310 Dysphagia, unspecified: Secondary | ICD-10-CM

## 2024-02-22 NOTE — Telephone Encounter (Signed)
 Attempted to contact patient to schedule OP MBS. Left VM @ [phone]. RKEEL

## 2024-03-13 ENCOUNTER — Ambulatory Visit (HOSPITAL_COMMUNITY)
Admission: RE | Admit: 2024-03-13 | Discharge: 2024-03-13 | Disposition: A | Discharge status: Home / Self Care | Source: Ambulatory Visit | Attending: *Deleted | Admitting: *Deleted

## 2024-03-13 ENCOUNTER — Ambulatory Visit (HOSPITAL_COMMUNITY)
Admission: RE | Admit: 2024-03-13 | Discharge: 2024-03-13 | Disposition: A | Discharge status: Home / Self Care | Source: Ambulatory Visit | Attending: Otolaryngology | Admitting: Otolaryngology

## 2024-03-13 ENCOUNTER — Other Ambulatory Visit (INDEPENDENT_AMBULATORY_CARE_PROVIDER_SITE_OTHER): Payer: Self-pay | Admitting: Otolaryngology

## 2024-03-13 ENCOUNTER — Ambulatory Visit (HOSPITAL_COMMUNITY)
Admission: RE | Admit: 2024-03-13 | Discharge: 2024-03-13 | Disposition: A | Discharge status: Home / Self Care | Source: Ambulatory Visit | Attending: Diagnostic Radiology | Admitting: Diagnostic Radiology

## 2024-03-13 DIAGNOSIS — G629 Polyneuropathy, unspecified: Secondary | ICD-10-CM | POA: Diagnosis not present

## 2024-03-13 DIAGNOSIS — I10 Essential (primary) hypertension: Secondary | ICD-10-CM | POA: Diagnosis not present

## 2024-03-13 DIAGNOSIS — R7303 Prediabetes: Secondary | ICD-10-CM | POA: Diagnosis not present

## 2024-03-13 DIAGNOSIS — E785 Hyperlipidemia, unspecified: Secondary | ICD-10-CM | POA: Diagnosis not present

## 2024-03-13 DIAGNOSIS — R059 Cough, unspecified: Secondary | ICD-10-CM | POA: Insufficient documentation

## 2024-03-13 DIAGNOSIS — R1314 Dysphagia, pharyngoesophageal phase: Secondary | ICD-10-CM | POA: Insufficient documentation

## 2024-03-13 DIAGNOSIS — R1310 Dysphagia, unspecified: Secondary | ICD-10-CM

## 2024-03-13 DIAGNOSIS — K219 Gastro-esophageal reflux disease without esophagitis: Secondary | ICD-10-CM | POA: Insufficient documentation

## 2024-03-13 DIAGNOSIS — Z79899 Other long term (current) drug therapy: Secondary | ICD-10-CM | POA: Diagnosis not present

## 2024-03-13 DIAGNOSIS — Z8673 Personal history of transient ischemic attack (TIA), and cerebral infarction without residual deficits: Secondary | ICD-10-CM | POA: Insufficient documentation

## 2024-03-20 ENCOUNTER — Ambulatory Visit: Admitting: Family Medicine

## 2024-03-28 ENCOUNTER — Encounter: Payer: Self-pay | Admitting: Family Medicine

## 2024-03-28 ENCOUNTER — Ambulatory Visit: Admitting: Family Medicine

## 2024-03-28 VITALS — BP 120/62 | HR 62 | Temp 97.7°F | Ht 63.5 in | Wt 231.6 lb

## 2024-03-28 DIAGNOSIS — J069 Acute upper respiratory infection, unspecified: Secondary | ICD-10-CM | POA: Diagnosis not present

## 2024-03-28 DIAGNOSIS — R209 Unspecified disturbances of skin sensation: Secondary | ICD-10-CM | POA: Insufficient documentation

## 2024-03-28 NOTE — Progress Notes (Signed)
 Subjective:    Patient ID: Robert Page, male    DOB: May 23, 1946, 78 y.o.   MRN: [id number]  Patient reports a 1 day history of head congestion.  He reports rhinorrhea and postnasal drip.  He denies any fevers or chills.  He denies any cough, shortness of breath, chest pain.  He denies any sinus pain.  He denies any sore throat.  Past Medical History:  Diagnosis Date   Adenomatous polyp    Allergy     Arthritis    Atrial tachycardia (HCC)    Coronary artery calcification seen on CT scan 06/26/2023   Coronary artery Ca2+ (CT in 12/2017) Myoview 10/29/12: EF 57, no ischemia     Diverticulosis    Dupuytren's disease    palms and soles   GERD (gastroesophageal reflux disease)    Hypertension    Stroke [facility])    Past Surgical History:  Procedure Laterality Date   BACK SURGERY     12/2013   CARDIAC CATHETERIZATION  1990's   COLECTOMY  2010   for diverticulitis   COLON SURGERY N/A    [facility] 08/27/2020   COLONOSCOPY  04/03/2013   LASIK  2002   Bil   POLYPECTOMY     Current Outpatient Medications on File Prior to Visit  Medication Sig Dispense Refill   amLODipine  (NORVASC ) 5 MG tablet Take 1 tablet (5 mg total) by mouth daily. 90 tablet 1   aspirin EC 81 MG tablet Take 81 mg by mouth daily.     DULoxetine  (CYMBALTA ) 60 MG capsule TAKE 1 CAPSULE BY MOUTH EVERY DAY 90 capsule 3   ezetimibe  (ZETIA ) 10 MG tablet Take 1 tablet (10 mg total) by mouth daily. 90 tablet 3   fluticasone  (FLONASE ) 50 MCG/ACT nasal spray Nasal for 90     levocetirizine (XYZAL ) 5 MG tablet Take 1 tablet (5 mg total) by mouth every evening. 90 tablet 0   metFORMIN  (GLUCOPHAGE ) 500 MG tablet Take 1 tablet (500 mg total) by mouth 2 (two) times daily with a meal. 90 tablet 3   omeprazole (PRILOSEC) 20 MG capsule Take 20 mg by mouth daily.     pregabalin  (LYRICA ) 200 MG capsule TAKE 1 CAPSULE BY MOUTH TWICE A DAY 180 capsule 3   tadalafil (CIALIS) 5 MG tablet Take 5 mg by mouth daily.     topiramate  (TOPAMAX )  25 MG tablet Take 1 tablet (25 mg total) by mouth daily as needed. 60 tablet 3   valsartan  (DIOVAN ) 160 MG tablet TAKE 1 TABLET BY MOUTH EVERY DAY 90 tablet 2   No current facility-administered medications on file prior to visit.   Allergies  Allergen Reactions   Sulfamethoxazole Other (See Comments)    other   Social History   Socioeconomic History   Marital status: Married    Spouse name: Biran Mayberry   Number of children: 3   Years of education: Not on file   Highest education level: Bachelor's degree (e.g., BA, AB, BS)  Occupational History   Occupation: Retired   Occupation: retired  Tobacco Use   Smoking status: Former    Types: Cigars    Quit date: 2015    Years since quitting: 10.5   Smokeless tobacco: Never   Tobacco comments:    Pt states smokes occasional cigar.  This is typically < 1 time per month  Vaping Use   Vaping status: Never Used  Substance and Sexual Activity   Alcohol use: No    Alcohol/week: 0.0  standard drinks of alcohol   Drug use: No   Sexual activity: Not on file  Other Topics Concern   Not on file  Social History Narrative   Pt lives in [place].     Retired from [employer] and [employer].     Goes on mission trips to [place], gets regular exercise.   Caffeine use: very rare, Diet coke   Right handed    Social Drivers of Health   Financial Resource Strain: Low Risk  (03/25/2024)   Overall Financial Resource Strain (CARDIA)    Difficulty of Paying Living Expenses: Not hard at all  Food Insecurity: No Food Insecurity (03/25/2024)   Hunger Vital Sign    Worried About Running Out of Food in the Last Year: Never true    Ran Out of Food in the Last Year: Never true  Transportation Needs: No Transportation Needs (03/25/2024)   PRAPARE - Administrator, Civil Service (Medical): No    Lack of Transportation (Non-Medical): No  Physical Activity: Insufficiently Active (03/25/2024)   Exercise Vital Sign    Days of Exercise per Week: 3  days    Minutes of Exercise per Session: 20 min  Stress: No Stress Concern Present (03/25/2024)   [employer] of Occupational Health - Occupational Stress Questionnaire    Feeling of Stress: Not at all  Social Connections: Socially Integrated (03/25/2024)   Social Connection and Isolation Panel    Frequency of Communication with Friends and Family: More than three times a week    Frequency of Social Gatherings with Friends and Family: More than three times a week    Attends Religious Services: More than 4 times per year    Active Member of [employer] or Organizations: Yes    Attends Engineer, structural: More than 4 times per year    Marital Status: Married  Catering manager Violence: Not At Risk (04/12/2023)   Humiliation, Afraid, Rape, and Kick questionnaire    Fear of Current or Ex-Partner: No    Emotionally Abused: No    Physically Abused: No    Sexually Abused: No   Family History  Problem Relation Age of Onset   Hypertension Mother    Macular degeneration Mother    Kidney disease Mother    Dementia Mother    Hypertension Father    Melanoma Sister    Stroke Other        uncle   Prostate cancer Other        uncle   Colon cancer Other        grandmother/uncle   Kidney disease Other        grandmother   Liver cancer Other        uncle (mets)   Heart disease Other        aunt/uncle   Lung cancer Other        uncle   Stomach cancer Cousin    Heart disease Cousin    Colon cancer Paternal Grandmother    Colon cancer Paternal Uncle    Prostate cancer Paternal Uncle    Arthritis Brother    Hypertension Brother    Esophageal cancer Neg Hx    Rectal cancer Neg Hx      Review of Systems  Neurological:  Positive for dizziness.  All other systems reviewed and are negative.      Objective:   Physical Exam Vitals reviewed.  Constitutional:      General: He is not in acute distress.  Appearance: He is well-developed. He is not diaphoretic.  HENT:      Head: Normocephalic and atraumatic.     Right Ear: Tympanic membrane and external ear normal.     Left Ear: Tympanic membrane and external ear normal.     Nose: Congestion present. No rhinorrhea.     Mouth/Throat:     Pharynx: No oropharyngeal exudate or posterior oropharyngeal erythema.  Eyes:     General: No scleral icterus.       Right eye: No discharge.        Left eye: No discharge.     Conjunctiva/sclera: Conjunctivae normal.     Pupils: Pupils are equal, round, and reactive to light.  Neck:     Thyroid : No thyromegaly.     Vascular: No JVD.     Trachea: No tracheal deviation.  Cardiovascular:     Rate and Rhythm: Normal rate and regular rhythm.     Heart sounds: Normal heart sounds. No murmur heard.    No friction rub. No gallop.  Pulmonary:     Effort: Pulmonary effort is normal. No respiratory distress.     Breath sounds: Normal breath sounds. No stridor. No wheezing or rales.  Chest:     Chest wall: No tenderness.  Musculoskeletal:        General: No tenderness. Normal range of motion.     Cervical back: Normal range of motion and neck supple.  Lymphadenopathy:     Cervical: No cervical adenopathy.  Skin:    General: Skin is warm.     Coloration: Skin is not pale.     Findings: No erythema or rash.  Neurological:     Mental Status: He is alert and oriented to person, place, and time.     Cranial Nerves: No cranial nerve deficit.     Motor: No abnormal muscle tone.     Coordination: Coordination normal.     Deep Tendon Reflexes: Reflexes are normal and symmetric.         Assessment & Plan:  Viral upper respiratory tract infection Patient appears to have a viral upper respiratory infection.  I recommended tincture of time with supportive care including Coricidin, and Afrin 2 sprays each nostril twice daily for 3 days as needed for nasal congestion

## 2024-04-05 ENCOUNTER — Other Ambulatory Visit: Payer: Self-pay | Admitting: Physician Assistant

## 2024-04-05 ENCOUNTER — Other Ambulatory Visit: Payer: Self-pay | Admitting: Physical Medicine and Rehabilitation

## 2024-04-08 ENCOUNTER — Ambulatory Visit: Admitting: Physical Medicine and Rehabilitation

## 2024-04-15 ENCOUNTER — Encounter: Attending: Physical Medicine and Rehabilitation | Admitting: Physical Medicine and Rehabilitation

## 2024-04-15 ENCOUNTER — Encounter: Payer: Self-pay | Admitting: Physical Medicine and Rehabilitation

## 2024-04-15 VITALS — BP 147/78 | HR 77 | Ht 63.5 in | Wt 233.2 lb

## 2024-04-15 DIAGNOSIS — G629 Polyneuropathy, unspecified: Secondary | ICD-10-CM | POA: Diagnosis present

## 2024-04-15 DIAGNOSIS — R7303 Prediabetes: Secondary | ICD-10-CM | POA: Insufficient documentation

## 2024-04-15 NOTE — Progress Notes (Signed)
 Subjective:    Patient ID: ALEXANDRA POSADAS, male    DOB: 1945-10-12, 78 y.o.   MRN: [id number]  HPI  1) Peripheral neuropathy 2/2 prediabetes -not better or worse -getting worse- spreading further up the leg- continues -takes metformin  500mg  daily, would like to try increasing to BID -HgbA1c improved to 6.1 -hurts to walk -he is active every do Male with pmh/psh of polyneuropathy, atrial tachycardia, back surgery (?laminectomy L4-5), left foot surgery (tumor removal x4), dupuytens (palms and soles), HTN, GERD presents for follow-up of bilateral neuropathy.  Initially stated: Started summer 2000.  He had foot surgery in left foot in ~2016. Progressing.  Radiating proximally.  No symptoms in hands.  Ambulating and activity improves the pain. Shoes and inactivity exacerbate the pain. Hot and tingling/burning.  He notes I am the 6th doctor he's been to. He saw PCP and Ortho, who did MRI and told non-surgical.  He was referred to Rheum, who did not believe it was RA and thought it was L4-5 radic.  He was referred to Neurology, who ordered NCV/EMG (showing polyneuropathy, left peroneal neuropathy, Left L5/S1 radic) and MRI.  MRI C-spine showing mild disc bulges C3-6. He had MRI L-spine.  He saw Neurosurg, who did Myelogram and was told symptoms not originating in back.  Constant.  Associated numbness.  Gabapentin , Lyrica , Cymbalta  did not help. He was told he has tried every single medication for neuropathy without benefit. He notes improvement when he sleeps in a different bed.  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 Pain does not limit activities.   -he does not take methylfolate  -he does not think that the qutenza  patches helped  -lid leaked of the semglutide- he brought it to the pharamcy and they made half of it again for him, but it still leaked. He might have seen some results but was not sure if  it was the medicine or the physical work he did outside this summer  -he is not sure if the medications are helping  -he asks if lyrica  can be increased  -neuropathy has been stable  Since that time, patient states he has not tried heat. He states he had burning with Capsacin for a few days but it does help for some time. He obtained Vitamin levels. Sleep has improved.  Patient states he was losing weight, but gained in on vacation. Patient states he often does not have time to take care of his body, take meds consistently, etc.   -He is taking Lyrica  and Cymbalta . He has tightness and stiffness in both lower extremities. He had a dose of steroid dose pack that did provide some relief to his back pain. If he is barefoot in the shower he feels off balance. He saw Dr. Kit at [facility]. He has had prior foot surgery- was told he had arthritis. He saw rheumatology and was told he does not have rheumatoid arthritis. He had EMG/NCS (results above). -he asks for refill of Lyrica  and Cymbalta    Both feet are really stiff -he did not get enough benefit enough from Qutenza .  Discussed trying longer duration to 35 minutes today since he did not have any burning with prior treatment and he is agreeable to this Did not require ice packs during treatment today, but toward end of treatment developed burning on dorsum of right foot Pain can be very severe at  night, this is when pain continues to be worse Needs medication refills today It negatively affects his sleep He feels burning pain worst on tops of both feet but also present in soles He also has pain radiating from his back into his right shin He is ok on his medicines- no refills needed right now He is not sure if Lyrica  or Cymbalta  are helping. He will let us  know when he needs refills Neuropathy has been stable, he feels stiff and numbness in his feet He does not want to take things that are not needed -His wife has been diagnosed with  lewy Body dementia -she was started on Trazodone.  -he does not use a heating pad -he has never tried metformin   2) Obesity: -patient notes Wegovy  was denied and he asks about alternative options -he felt less hungry with the semalglutide.  - 4 lbs less than last visit -asks for medication that can help with weight loss  3) Muscle cramps: - he has been getting muscle cramps at night  4) Prediabetes -HgbA1c increased to 6.3 -gave up sweet tea -he asks if slaw is ok -tries to eat green beans  5) Knee stiffness: -present in bilateral knees  Pain Inventory Average Pain 5 Pain Right Now 4 My pain is dull  In the last 24 hours, has pain interfered with the following? General activity 7 Relation with others 8 Enjoyment of life 7 What TIME of day is your pain at its worst?  evening Sleep (in general) Fair  Pain is worse with:  some activities Pain improves with: rest medication Relief from Meds: 5   Family History  Problem Relation Age of Onset   Hypertension Mother    Macular degeneration Mother    Kidney disease Mother    Dementia Mother    Hypertension Father    Melanoma Sister    Stroke Other        uncle   Prostate cancer Other        uncle   Colon cancer Other        grandmother/uncle   Kidney disease Other        grandmother   Liver cancer Other        uncle (mets)   Heart disease Other        aunt/uncle   Lung cancer Other        uncle   Stomach cancer Cousin    Heart disease Cousin    Colon cancer Paternal Grandmother    Colon cancer Paternal Uncle    Prostate cancer Paternal Uncle    Arthritis Brother    Hypertension Brother    Esophageal cancer Neg Hx    Rectal cancer Neg Hx    Social History   Socioeconomic History   Marital status: Married    Spouse name: Francesco Provencal   Number of children: 3   Years of education: Not on file   Highest education level: Bachelor's degree (e.g., BA, AB, BS)  Occupational History   Occupation:  Retired   Occupation: retired  Tobacco Use   Smoking status: Former    Types: Cigars    Quit date: 2015    Years since quitting: 10.6   Smokeless tobacco: Never   Tobacco comments:    Pt states smokes occasional cigar.  This is typically < 1 time per month  Vaping Use   Vaping status: Never Used  Substance and Sexual Activity   Alcohol use: No    Alcohol/week: 0.0  standard drinks of alcohol   Drug use: No   Sexual activity: Not on file  Other Topics Concern   Not on file  Social History Narrative   Pt lives in [place].     Retired from [employer] and [employer].     Goes on mission trips to [place], gets regular exercise.   Caffeine use: very rare, Diet coke   Right handed    Social Drivers of Health   Financial Resource Strain: Low Risk  (03/25/2024)   Overall Financial Resource Strain (CARDIA)    Difficulty of Paying Living Expenses: Not hard at all  Food Insecurity: No Food Insecurity (03/25/2024)   Hunger Vital Sign    Worried About Running Out of Food in the Last Year: Never true    Ran Out of Food in the Last Year: Never true  Transportation Needs: No Transportation Needs (03/25/2024)   PRAPARE - Administrator, Civil Service (Medical): No    Lack of Transportation (Non-Medical): No  Physical Activity: Insufficiently Active (03/25/2024)   Exercise Vital Sign    Days of Exercise per Week: 3 days    Minutes of Exercise per Session: 20 min  Stress: No Stress Concern Present (03/25/2024)   [employer] of Occupational Health - Occupational Stress Questionnaire    Feeling of Stress: Not at all  Social Connections: Socially Integrated (03/25/2024)   Social Connection and Isolation Panel    Frequency of Communication with Friends and Family: More than three times a week    Frequency of Social Gatherings with Friends and Family: More than three times a week    Attends Religious Services: More than 4 times per year    Active Member of Clubs or Organizations: Yes     Attends Engineer, structural: More than 4 times per year    Marital Status: Married   Past Surgical History:  Procedure Laterality Date   BACK SURGERY     12/2013   CARDIAC CATHETERIZATION  1990's   COLECTOMY  2010   for diverticulitis   COLON SURGERY N/A    [facility] 08/27/2020   COLONOSCOPY  04/03/2013   LASIK  2002   Bil   POLYPECTOMY     Past Medical History:  Diagnosis Date   Adenomatous polyp    Allergy     Arthritis    Atrial tachycardia (HCC)    Coronary artery calcification seen on CT scan 06/26/2023   Coronary artery Ca2+ (CT in 12/2017) Myoview 10/29/12: EF 57, no ischemia     Diverticulosis    Dupuytren's disease    palms and soles   GERD (gastroesophageal reflux disease)    Hypertension    Stroke (HCC)    There were no vitals taken for this visit.  Opioid Risk Score:   Fall Risk Score:  `1  Depression screen PHQ 2/9     03/28/2024    2:00 PM 01/07/2024   10:36 AM 12/13/2023   11:59 AM 10/12/2023   11:28 AM 06/05/2023    9:13 AM 04/12/2023    1:00 PM 02/13/2023   10:46 AM  Depression screen PHQ 2/9  Decreased Interest 0 0 0 0 0 0 0  Down, Depressed, Hopeless 0 0 0 0 0 0 0  PHQ - 2 Score 0 0 0 0 0 0 0  Altered sleeping   0      Tired, decreased energy   1      Change in appetite   1  Feeling bad or failure about yourself    0      Trouble concentrating   0      Moving slowly or fidgety/restless   0      Suicidal thoughts   0      PHQ-9 Score   2      Difficult doing work/chores   Not difficult at all       Review of Systems  Constitutional: Negative.   HENT: Negative.    Eyes: Negative.   Respiratory: Negative.    Cardiovascular: Negative.   Gastrointestinal: Negative.   Endocrine: Negative.   Genitourinary: Negative.   Musculoskeletal:  Positive for gait problem.       Left leg and hip. Dropped a piece of equipment and it hit his leg and knocked him backwards on his hip. Leg has abrasions.  Skin:  Positive for wound.   Allergic/Immunologic: Negative.   Hematological: Negative.   Psychiatric/Behavioral: Negative.        Objective:   Physical Exam  PRIOR EXAM Gen: no distress, normal appearing HEENT: oral mucosa pink and moist, NCAT Cardio: Reg rate Chest: normal effort, normal rate of breathing Abd: soft, non-distended Ext: no edema Psych: pleasant, normal affect Skin: scar on LLE Neuro: Alert and oriented x3, antalgic gait    Assessment & Plan:  Male with pmh/psh ofpolyneuropathy, atrial tachycardia, back surgery (?laminectomy L4-5), left foot surgery (tumor removal x4), dupuytens (palms and soles), HTN, GERD presents for f/u b/l feet pain.  1. 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  MRI back report showing ? L5-S1 radic  Recommended whole food rich diet.   Recommended B vitamin complex with methylfolate, discussed this can also help to prevent heart disease  NCS/EMG - showing polyneuropathy and L5-SI radic on left, results reviewed.   No benefit with Gabapentin   Trial Heat, reminded again  Will consider PT  Discussing purchasing TENS unit from [facility]  Trial OTC Lidocaine  patch  Continue Lyrica  to 200mg  BID  Will consider referral to Neurosurg, pt would like to hold off at present  Will consider ESI, pt would like to hold off at present  Will consider PNS  Retry Capsacin, educated again  Continue Cymbalta  60mg  daily  Discussed that he gets exposed to pesticides in his job  Continue lyrica    -will not repeat Qutenza  given lack of benefit and cost  Prescribed metformin  500mg  daily prn  Patient states main goal is to be pain free - improving  Educated on importance of caring for self. -Discussed current  symptoms of pain and history of pain.  -Discussed benefits of exercise in reducing pain. -Discussed following foods that may reduce pain: 1) Ginger (especially studied for arthritis)- reduce leukotriene production to decrease inflammation 2) Blueberries- high in phytonutrients that decrease inflammation 3) Salmon- marine omega-3s reduce joint swelling and pain 4) Pumpkin seeds- reduce inflammation 5) dark chocolate- reduces inflammation 6) turmeric- reduces inflammation 7) tart cherries - reduce pain and stiffness 8) extra virgin olive oil - its compound olecanthal helps to block prostaglandins  9) chili peppers- can be eaten or applied topically via capsaicin  10) mint- helpful for headache, muscle aches, joint pain, and itching 11) garlic- reduces inflammation  Link to further information  on diet for chronic pain: [email or web address].[email or web address]/    2. Sleep disturbance  -continue topamax  prn  -discussed nasal strips and mouth taping  Discontinue amitriptyline   Discussed that Qutenza  has been particularly shown to help with painful neuropathy at night  Continue lyrica  to 200mg  BID -Try to go outside near sunrise -Get exercise during the day.  -Turn off all devices an hour before bedtime.  -Teas that can benefit: chamomile, valerian root, Brahmi (Bacopa) -Can consider over the counter melatonin, magnesium, and/or L-theanine. Melatonin is an anti-oxidant with multiple health benefits. Magnesium is involved in greater than 300 enzymatic reactions in the body and most of us  are deficient as our soil is often depleted. There are 7 different types of magnesium- Bioptemizer's is a supplement with all 7 types, and each has unique benefits. Magnesium can also help with constipation and anxiety.  -Pistachios naturally increase the production of melatonin -Cozy Earth bamboo bed sheets are free from toxic chemicals.  -Tart cherry juice or  a tart cherry supplement can improve sleep and soreness post-workout   Continue tylenol  PM  3. Obesity:  -commended on 6 lb weight loss!  -topamx 25mg  daily prescribed, continue  Discussed sleep apnea testing  Discussed weight is stable from last visit  Discussed that [facility] was denied  Discussed risks and benefits of Contrave, Qysimia, and compounded semglutide and patient chose latter  Not interested in seeing dietitian  Educated again  Discussed following criteria for Wegovy : 1) Patient has diagnosis of obesity; 2) Patient must be 32 years of age or older; 3) The patient has been involved in a physician or dietitian monitored weight loss program, consisting of both low-calorie diet, increased physical activity and behavioral counseling for a minimum of 6 months without a 3% loss from baseline; 4) Patients BMI is one of the following: a) 30kg/m or greater; b) 27 29.99 kg/m in the presence of at least one weight related comorbidity such as coronary heart disease, dyslipidemia, hypertension, symptomatic arthritis of the lower extremities, type 2 diabetes mellitus, obstructive sleep apnea; c) 25-29.9 kg/m2 and waist circumference is &gt; 40 inches for males or &gt; 35 inches for females;5) Not Met - Must have tried and failed at least one preferred oral agent such as Phentermine; 6) Patient has no labeled contraindication; 7) Patient is not taking another weight loss medication; 8) The dose is within approved product labeling guidelines; 10) The patient must not be taking another GLP-1 receptor agonist AND the patient must not be taking insulin concurrently.   -prescribed Wegovy  -recommended resistance training and prioritizing protein intake to avoid muscle loss while taking this medication -discussed risks of thyroid  cancer and gastroparesis   4. Myalgia  D/c robaxin   Lipid panel reviewed and normal  5. Prediabetes: -repeat HgbA1c today -discussed stopping soda and sweet tea -discussed  making own sweet tea at home with black tea and adding honey or allulose as sweeteners if he feels he needs sweetener -metformin  500mg  daily prn ordered, discussed risks and benefits   -discussed diet  -discussed that he does not use a lot of sugar -try to incorporate into your diet some of the following foods which are good for diabetes: 1) cinnamon- imitates effects of insulin, increasing glucose transport into cells ([place] or [place] cinnamon is best, least processed) 2) nuts- can slow down the blood sugar response of carbohydrate rich foods 3) oatmeal- contains and anti-inflammatory compound avenanthramide 4) whole-milk yogurt (best types are no sugar, [place] yogurt, or goat/sheep yogurt) 5) beans- high in  protein, fiber, and vitamins, low glycemic index 6) broccoli- great source of vitamin A and C 7) quinoa- higher in protein and fiber than other grains 8) spinach- high in vitamin A, fiber, and protein 9) olive oil- reduces glucose levels, LDL, and triglycerides 10) salmon- excellent amount of omega-3-fatty acids 11) walnuts- rich in antioxidants 12) apples- high in fiber and quercetin 13) carrots- highly nutritious with low impact on blood sugar 14) eggs- improve HDL (good cholesterol), high in protein, keep you satiated 15) turmeric: improves blood sugars, cardiovascular disease, and protects kidney health 16) garlic: improves blood sugar, blood pressure, pain 17) tomatoes: highly nutritious with low impact on blood sugar   6) HTN: -check BMP and magnesium level -discussed trying to minimize stress from his wife's diagnosis -discussed that he has a lot of support from his two loving daughters and son  7) leg swelling:  -recommended frequent rest breaks when driving  8) History of CVA: -recommended methylated B vitamin -continue green beans and garden peas  9) Fatigue:  -recommended avoiding visceral fat in food -discussed checking testosterone  level -discussed vitamin  D is probably fine   10) muscle cramps: -recommended magnesium glycinate 250mg  HS, discussed this can also help with sleep.   11) Chronic wound: -discussed applying manuka honey onto wound -discussed red light therapy  12) Frequent falls: -discussed that he fell a few times in the past few months and feels it was because he was not picking his feet up

## 2024-04-15 NOTE — Patient Instructions (Addendum)
 Medihoney (manuka honey)  Turmeric to reduce inflammation--can be used in cooking or taken as a supplement.  Benefits of turmeric:  -Highly anti-inflammatory  -Increases antioxidants  -Improves memory, attention, brain disease  -Lowers risk of heart disease  -May help prevent cancer  -Decreases pain  -Alleviates depression  -Delays aging and decreases risk of chronic disease  -Consume with black pepper to increase absorption    Turmeric Milk Recipe:  1 cup milk  1 tsp turmeric  1 tsp cinnamon  1 tsp grated ginger (optional)  Black pepper (boosts the anti-inflammatory properties of turmeric).  1 tsp honey

## 2024-04-16 ENCOUNTER — Encounter: Admitting: Physical Medicine and Rehabilitation

## 2024-04-16 DIAGNOSIS — R7303 Prediabetes: Secondary | ICD-10-CM

## 2024-04-16 LAB — HEMOGLOBIN A1C
Est. average glucose Bld gHb Est-mCnc: 123 mg/dL
Hgb A1c MFr Bld: 5.9 % — ABNORMAL HIGH (ref 4.8–5.6)

## 2024-04-16 NOTE — Progress Notes (Addendum)
 Subjective:    Patient ID: Robert Page, male    DOB: 08-07-1946, 78 y.o.   MRN: [id number]  HPI An audio/video tele-health visit is felt to be the most appropriate encounter for this patient at this time. This is a follow up tele-visit via phone. The patient is at home. MD is at office. Prior to scheduling this appointment, our staff discussed the limitations of evaluation and management by telemedicine and the availability of in-person appointments. The patient expressed understanding and agreed to proceed.   1) Peripheral neuropathy 2/2 prediabetes -not better or worse -he has cut out bread and white potatoes -he eats green beans, peas -getting worse- spreading further up the leg- continues -takes metformin  500mg  daily, would like to try increasing to BID -HgbA1c improved to 6.1 -hurts to walk -he is active every do Male with pmh/psh of polyneuropathy, atrial tachycardia, back surgery (?laminectomy L4-5), left foot surgery (tumor removal x4), dupuytens (palms and soles), HTN, GERD presents for follow-up of bilateral neuropathy.  Initially stated: Started summer 2000.  He had foot surgery in left foot in ~2016. Progressing.  Radiating proximally.  No symptoms in hands.  Ambulating and activity improves the pain. Shoes and inactivity exacerbate the pain. Hot and tingling/burning.  He notes I am the 6th doctor he's been to. He saw PCP and Ortho, who did MRI and told non-surgical.  He was referred to Rheum, who did not believe it was RA and thought it was L4-5 radic.  He was referred to Neurology, who ordered NCV/EMG (showing polyneuropathy, left peroneal neuropathy, Left L5/S1 radic) and MRI.  MRI C-spine showing mild disc bulges C3-6. He had MRI L-spine.  He saw Neurosurg, who did Myelogram and was told symptoms not originating in back.  Constant.  Associated numbness.  Gabapentin , Lyrica , Cymbalta  did not help. He was told he has tried every single medication for neuropathy without benefit.  He notes improvement when he sleeps in a different bed.  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 Pain does not limit activities.   -he does not take methylfolate  -he does not think that the qutenza  patches helped  -lid leaked of the semglutide- he brought it to the pharamcy and they made half of it again for him, but it still leaked. He might have seen some results but was not sure if it was the medicine or the physical work he did outside this summer  -he is not sure if the medications are helping  -he asks if lyrica  can be increased  -neuropathy has been stable  Since that time, patient states he has not tried heat. He states he had burning with Capsacin for a few days but it does help for some time. He obtained Vitamin levels. Sleep has improved.  Patient states he was losing weight, but gained in on vacation. Patient states he often does not have time to take care of his body, take meds consistently, etc.   -He is taking Lyrica  and Cymbalta . He has tightness and stiffness in both lower extremities. He had a dose of steroid dose pack that did provide some relief to his back pain. If he is barefoot in the shower he feels off balance. He saw [facility]. Kit at [facility]. He has had prior foot surgery- was told he had arthritis. He saw rheumatology and was told he does not have rheumatoid arthritis. He  had EMG/NCS (results above). -he asks for refill of Lyrica  and Cymbalta    Both feet are really stiff -he did not get enough benefit enough from Qutenza .  Discussed trying longer duration to 35 minutes today since he did not have any burning with prior treatment and he is agreeable to this Did not require ice packs during treatment today, but toward end of treatment developed burning on dorsum of right foot Pain can be very severe at night, this is when pain continues to be worse Needs medication  refills today It negatively affects his sleep He feels burning pain worst on tops of both feet but also present in soles He also has pain radiating from his back into his right shin He is ok on his medicines- no refills needed right now He is not sure if Lyrica  or Cymbalta  are helping. He will let us  know when he needs refills Neuropathy has been stable, he feels stiff and numbness in his feet He does not want to take things that are not needed -His wife has been diagnosed with lewy Body dementia -she was started on Trazodone.  -he does not use a heating pad -he has never tried metformin   2) Obesity: -patient notes Wegovy  was denied and he asks about alternative options -he felt less hungry with the semalglutide.  - 4 lbs less than last visit -asks for medication that can help with weight loss  3) Muscle cramps: - he has been getting muscle cramps at night  4) Prediabetes -HgbA1c increased to 6.3 -gave up sweet tea -he asks if slaw is ok -tries to eat green beans  5) Knee stiffness: -present in bilateral knees  Pain Inventory Average Pain 5 Pain Right Now 4 My pain is dull  In the last 24 hours, has pain interfered with the following? General activity 7 Relation with others 8 Enjoyment of life 7 What TIME of day is your pain at its worst?  evening Sleep (in general) Fair  Pain is worse with:  some activities Pain improves with: rest medication Relief from Meds: 5   Family History  Problem Relation Age of Onset   Hypertension Mother    Macular degeneration Mother    Kidney disease Mother    Dementia Mother    Hypertension Father    Melanoma Sister    Stroke Other        uncle   Prostate cancer Other        uncle   Colon cancer Other        grandmother/uncle   Kidney disease Other        grandmother   Liver cancer Other        uncle (mets)   Heart disease Other        aunt/uncle   Lung cancer Other        uncle   Stomach cancer Cousin    Heart  disease Cousin    Colon cancer Paternal Grandmother    Colon cancer Paternal Uncle    Prostate cancer Paternal Uncle    Arthritis Brother    Hypertension Brother    Esophageal cancer Neg Hx    Rectal cancer Neg Hx    Social History   Socioeconomic History   Marital status: Married    Spouse name: Gjon Letarte   Number of children: 3   Years of education: Not on file   Highest education level: Bachelor's degree (e.g., BA, AB, BS)  Occupational History   Occupation: Retired  Occupation: retired  Tobacco Use   Smoking status: Former    Types: Cigars    Quit date: 2015    Years since quitting: 10.6   Smokeless tobacco: Never   Tobacco comments:    Pt states smokes occasional cigar.  This is typically < 1 time per month  Vaping Use   Vaping status: Never Used  Substance and Sexual Activity   Alcohol use: No    Alcohol/week: 0.0 standard drinks of alcohol   Drug use: No   Sexual activity: Not on file  Other Topics Concern   Not on file  Social History Narrative   Pt lives in [place].     Retired from [employer] and [employer].     Goes on mission trips to [place], gets regular exercise.   Caffeine use: very rare, Diet coke   Right handed    Social Drivers of Health   Financial Resource Strain: Low Risk  (03/25/2024)   Overall Financial Resource Strain (CARDIA)    Difficulty of Paying Living Expenses: Not hard at all  Food Insecurity: No Food Insecurity (03/25/2024)   Hunger Vital Sign    Worried About Running Out of Food in the Last Year: Never true    Ran Out of Food in the Last Year: Never true  Transportation Needs: No Transportation Needs (03/25/2024)   PRAPARE - Administrator, Civil Service (Medical): No    Lack of Transportation (Non-Medical): No  Physical Activity: Insufficiently Active (03/25/2024)   Exercise Vital Sign    Days of Exercise per Week: 3 days    Minutes of Exercise per Session: 20 min  Stress: No Stress Concern Present (03/25/2024)    [employer] of Occupational Health - Occupational Stress Questionnaire    Feeling of Stress: Not at all  Social Connections: Socially Integrated (03/25/2024)   Social Connection and Isolation Panel    Frequency of Communication with Friends and Family: More than three times a week    Frequency of Social Gatherings with Friends and Family: More than three times a week    Attends Religious Services: More than 4 times per year    Active Member of Clubs or Organizations: Yes    Attends Engineer, structural: More than 4 times per year    Marital Status: Married   Past Surgical History:  Procedure Laterality Date   BACK SURGERY     12/2013   CARDIAC CATHETERIZATION  1990's   COLECTOMY  2010   for diverticulitis   COLON SURGERY N/A    [facility] 08/27/2020   COLONOSCOPY  04/03/2013   LASIK  2002   Bil   POLYPECTOMY     Past Medical History:  Diagnosis Date   Adenomatous polyp    Allergy     Arthritis    Atrial tachycardia (HCC)    Coronary artery calcification seen on CT scan 06/26/2023   Coronary artery Ca2+ (CT in 12/2017) Myoview 10/29/12: EF 57, no ischemia     Diverticulosis    Dupuytren's disease    palms and soles   GERD (gastroesophageal reflux disease)    Hypertension    Stroke (HCC)    There were no vitals taken for this visit.  Opioid Risk Score:   Fall Risk Score:  `1  Depression screen [facility] 2/9     04/15/2024    9:55 AM 03/28/2024    2:00 PM 01/07/2024   10:36 AM 12/13/2023   11:59 AM 10/12/2023   11:28 AM 06/05/2023  9:13 AM 04/12/2023    1:00 PM  Depression screen PHQ 2/9  Decreased Interest 0 0 0 0 0 0 0  Down, Depressed, Hopeless 0 0 0 0 0 0 0  PHQ - 2 Score 0 0 0 0 0 0 0  Altered sleeping    0     Tired, decreased energy    1     Change in appetite    1     Feeling bad or failure about yourself     0     Trouble concentrating    0     Moving slowly or fidgety/restless    0     Suicidal thoughts    0     PHQ-9 Score    2     Difficult  doing work/chores    Not difficult at all      Review of Systems  Constitutional: Negative.   HENT: Negative.    Eyes: Negative.   Respiratory: Negative.    Cardiovascular: Negative.   Gastrointestinal: Negative.   Endocrine: Negative.   Genitourinary: Negative.   Musculoskeletal:  Positive for gait problem.       Left leg and hip. Dropped a piece of equipment and it hit his leg and knocked him backwards on his hip. Leg has abrasions.  Skin:  Positive for wound.  Allergic/Immunologic: Negative.   Hematological: Negative.   Psychiatric/Behavioral: Negative.        Objective:   Physical Exam  PRIOR EXAM Gen: no distress, normal appearing HEENT: oral mucosa pink and moist, NCAT Cardio: Reg rate Chest: normal effort, normal rate of breathing Abd: soft, non-distended Ext: no edema Psych: pleasant, normal affect Skin: scar on LLE Neuro: Alert and oriented x3, antalgic gait    Assessment & Plan:  Male with pmh/psh ofpolyneuropathy, atrial tachycardia, back surgery (?laminectomy L4-5), left foot surgery (tumor removal x4), dupuytens (palms and soles), HTN, GERD presents for f/u b/l feet pain.  1. 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  MRI back report showing ? L5-S1 radic  Recommended whole food rich diet.   Recommended B vitamin complex with methylfolate, discussed this can also help to prevent heart disease  NCS/EMG - showing polyneuropathy and L5-SI radic on left, results reviewed.   No benefit with Gabapentin   Trial Heat, reminded again  Will consider PT  Discussing purchasing TENS unit from [facility]  Trial OTC Lidocaine  patch  Continue Lyrica  to 200mg  BID  Will consider referral to  Neurosurg, pt would like to hold off at present  Will consider ESI, pt would like to hold off at present  Will consider PNS  Retry Capsacin, educated again  Continue Cymbalta  60mg  daily  Discussed that he gets exposed to pesticides in his job  Continue lyrica    -will not repeat Qutenza  given lack of benefit and cost  Prescribed metformin  500mg  daily prn  Patient states main goal is to be pain free - improving  Educated on importance of caring for self. -Discussed current symptoms of pain and history of pain.  -Discussed benefits of exercise in reducing pain. -Discussed following foods that may reduce pain: 1) Ginger (especially studied for arthritis)- reduce leukotriene production to decrease inflammation 2) Blueberries- high in phytonutrients  that decrease inflammation 3) Salmon- marine omega-3s reduce joint swelling and pain 4) Pumpkin seeds- reduce inflammation 5) dark chocolate- reduces inflammation 6) turmeric- reduces inflammation 7) tart cherries - reduce pain and stiffness 8) extra virgin olive oil - its compound olecanthal helps to block prostaglandins  9) chili peppers- can be eaten or applied topically via capsaicin  10) mint- helpful for headache, muscle aches, joint pain, and itching 11) garlic- reduces inflammation  Link to further information on diet for chronic pain: [email or web address].[email or web address]    2. Sleep disturbance  -continue topamax  prn  -discussed nasal strips and mouth taping  Discontinue amitriptyline   Discussed that Qutenza  has been particularly shown to help with painful neuropathy at night  Continue lyrica  to 200mg  BID -Try to go outside near sunrise -Get exercise during the day.  -Turn off all devices an hour before bedtime.  -Teas that can benefit: chamomile, valerian root, Brahmi (Bacopa) -Can consider over the counter melatonin, magnesium, and/or L-theanine. Melatonin is an anti-oxidant  with multiple health benefits. Magnesium is involved in greater than 300 enzymatic reactions in the body and most of us  are deficient as our soil is often depleted. There are 7 different types of magnesium- Bioptemizer's is a supplement with all 7 types, and each has unique benefits. Magnesium can also help with constipation and anxiety.  -Pistachios naturally increase the production of melatonin -Cozy Earth bamboo bed sheets are free from toxic chemicals.  -Tart cherry juice or a tart cherry supplement can improve sleep and soreness post-workout   Continue tylenol  PM  3. Obesity:  -commended on 6 lb weight loss!  -topamx 25mg  daily prescribed, continue  Discussed sleep apnea testing  Discussed weight is stable from last visit  Discussed that [facility] was denied  Discussed risks and benefits of Contrave, Qysimia, and compounded semglutide and patient chose latter  Not interested in seeing dietitian  Educated again  Discussed following criteria for Wegovy : 1) Patient has diagnosis of obesity; 2) Patient must be 75 years of age or older; 3) The patient has been involved in a physician or dietitian monitored weight loss program, consisting of both low-calorie diet, increased physical activity and behavioral counseling for a minimum of 6 months without a 3% loss from baseline; 4) Patients BMI is one of the following: a) 30kg/m or greater; b) 27 29.99 kg/m in the presence of at least one weight related comorbidity such as coronary heart disease, dyslipidemia, hypertension, symptomatic arthritis of the lower extremities, type 2 diabetes mellitus, obstructive sleep apnea; c) 25-29.9 kg/m2 and waist circumference is &gt; 40 inches for males or &gt; 35 inches for females;5) Not Met - Must have tried and failed at least one preferred oral agent such as Phentermine; 6) Patient has no labeled contraindication; 7) Patient is not taking another weight loss medication; 8) The dose is within approved product labeling  guidelines; 10) The patient must not be taking another GLP-1 receptor agonist AND the patient must not be taking insulin concurrently.   -prescribed Wegovy  -recommended resistance training and prioritizing protein intake to avoid muscle loss while taking this medication -discussed risks of thyroid  cancer and gastroparesis   4. Myalgia  D/c robaxin   Lipid panel reviewed and normal  5. Prediabetes: -discussed his HgbA1c improvement to 5.9!, discussed that this has improved, discussed wheat products and potatoes, discussed using frozen bananas for ice cream -discussed stopping soda and sweet tea -discussed making own sweet tea at home with black tea and adding honey or allulose as sweeteners if he feels  he needs sweetener -metformin  500mg  daily prn ordered, discussed risks and benefits   -discussed diet  -discussed that he does not use a lot of sugar -try to incorporate into your diet some of the following foods which are good for diabetes: 1) cinnamon- imitates effects of insulin, increasing glucose transport into cells ([place] cinnamon is best, least processed) 2) nuts- can slow down the blood sugar response of carbohydrate rich foods 3) oatmeal- contains and anti-inflammatory compound avenanthramide 4) whole-milk yogurt (best types are no sugar, [place] yogurt, or goat/sheep yogurt) 5) beans- high in protein, fiber, and vitamins, low glycemic index 6) broccoli- great source of vitamin A and C 7) quinoa- higher in protein and fiber than other grains 8) spinach- high in vitamin A, fiber, and protein 9) olive oil- reduces glucose levels, LDL, and triglycerides 10) salmon- excellent amount of omega-3-fatty acids 11) walnuts- rich in antioxidants 12) apples- high in fiber and quercetin 13) carrots- highly nutritious with low impact on blood sugar 14) eggs- improve HDL (good cholesterol), high in protein, keep you satiated 15) turmeric: improves blood sugars, cardiovascular  disease, and protects kidney health 16) garlic: improves blood sugar, blood pressure, pain 17) tomatoes: highly nutritious with low impact on blood sugar   6) HTN: -check BMP and magnesium level -discussed trying to minimize stress from his wife's diagnosis -discussed that he has a lot of support from his two loving daughters and son  7) leg swelling:  -recommended frequent rest breaks when driving  8) History of CVA: -recommended methylated B vitamin -continue green beans and garden peas  9) Fatigue:  -recommended avoiding visceral fat in food -discussed checking testosterone  level -discussed vitamin D is probably fine   10) muscle cramps: -recommended magnesium glycinate 250mg  HS, discussed this can also help with sleep.   11) Chronic wound: -discussed applying manuka honey onto wound -discussed red light therapy  12) Frequent falls: -discussed that he fell a few times in the past few months and feels it was because he was not picking his feet up  5 minutes spent in discussion of his HgbA1c, discussed that this has improved, discussed wheat products and potatoes, discussed using frozen bananas for ice cream

## 2024-04-17 ENCOUNTER — Ambulatory Visit (INDEPENDENT_AMBULATORY_CARE_PROVIDER_SITE_OTHER): Payer: Medicare Other

## 2024-04-17 VITALS — BP 147/78 | Ht 69.5 in | Wt 231.0 lb

## 2024-04-17 DIAGNOSIS — Z2821 Immunization not carried out because of patient refusal: Secondary | ICD-10-CM

## 2024-04-17 DIAGNOSIS — Z0000 Encounter for general adult medical examination without abnormal findings: Secondary | ICD-10-CM

## 2024-04-17 NOTE — Patient Instructions (Signed)
 [name] , Thank you for taking time out of your busy schedule to complete your Annual Wellness Visit with me. I enjoyed our conversation and look forward to speaking with you again next year. I, as well as your care team,  appreciate your ongoing commitment to your health goals. Please review the following plan we discussed and let me know if I can assist you in the future. Your Game plan/ To Do List    Referrals: If you haven't heard from the office you've been referred to, please reach out to them at the phone provided.   Follow up Visits: We will see or speak with you next year for your Next Medicare AWV with our clinical staff Have you seen your provider in the last 6 months (3 months if uncontrolled diabetes)? Yes  Clinician Recommendations:  Aim for 30 minutes of exercise or brisk walking, 6-8 glasses of water, and 5 servings of fruits and vegetables each day.       This is a list of the screenings recommended for you:  Health Maintenance  Topic Date Due   Zoster (Shingles) Vaccine (1 of 2) Never done   COVID-19 Vaccine (4 - 2024-25 season) 05/06/2023   Flu Shot  04/04/2024   Hepatitis C Screening  10/11/2024*   Medicare Annual Wellness Visit  04/17/2025   DTaP/Tdap/Td vaccine (3 - Td or Tdap) 10/17/2026   Pneumococcal Vaccine for age over 60  Completed   HPV Vaccine  Aged Out   Meningitis B Vaccine  Aged Out   Pneumococcal Vaccine  Discontinued   Colon Cancer Screening  Discontinued  *Topic was postponed. The date shown is not the original due date.    Advanced directives: (Declined) Advance directive discussed with you today. Even though you declined this today, please call our office should you change your mind, and we can give you the proper paperwork for you to fill out. Advance Care Planning is important because it:  [x]  Makes sure you receive the medical care that is consistent with your values, goals, and preferences  [x]  It provides guidance to your family and  loved ones and reduces their decisional burden about whether or not they are making the right decisions based on your wishes.  Follow the link provided in your after visit summary or read over the paperwork we have mailed to you to help you started getting your Advance Directives in place. If you need assistance in completing these, please reach out to us  so that we can help you!  See attachments for Preventive Care and Fall Prevention Tips.

## 2024-04-17 NOTE — Progress Notes (Signed)
 Because this visit was a virtual/telehealth visit,  certain criteria was not obtained, such a blood pressure, CBG if applicable, and timed get up and go. Any medications not marked as taking were not mentioned during the medication reconciliation part of the visit. Any vitals not documented were not able to be obtained due to this being a telehealth visit or patient was unable to self-report a recent blood pressure reading due to a lack of equipment at home via telehealth. Vitals that have been documented are verbally provided by the patient.  This visit was performed by a medical professional under my direct supervision. I was immediately available for consultation/collaboration. I have reviewed and agree with the Annual Wellness Visit documentation.  Subjective:   Robert Page is a 78 y.o. who presents for a Medicare Wellness preventive visit.  As a reminder, Annual Wellness Visits don't include a physical exam, and some assessments may be limited, especially if this visit is performed virtually. We may recommend an in-person follow-up visit with your provider if needed.  Visit Complete: Virtual I connected with  Robert Page on 04/17/24 by a audio enabled telemedicine application and verified that I am speaking with the correct person using two identifiers.  Patient Location: Home  Provider Location: Home Office  I discussed the limitations of evaluation and management by telemedicine. The patient expressed understanding and agreed to proceed.  Vital Signs: Because this visit was a virtual/telehealth visit, some criteria may be missing or patient reported. Any vitals not documented were not able to be obtained and vitals that have been documented are patient reported.  VideoDeclined- This patient declined Librarian, academic. Therefore the visit was completed with audio only.  Persons Participating in Visit: Patient.  AWV Questionnaire: No: Patient Medicare  AWV questionnaire was not completed prior to this visit.  Cardiac Risk Factors include: advanced age (>45men, >72 women);male gender;obesity (BMI >30kg/m2);hypertension;diabetes mellitus     Objective:    Today's Vitals   04/17/24 1121 04/17/24 1122  BP: (!) 147/78   Weight: 231 lb (104.8 kg)   Height: 5' 9.5 (1.765 m)   PainSc:  5    Body mass index is 33.62 kg/m.     04/17/2024   11:26 AM 04/12/2023    1:02 PM 09/24/2022    1:01 PM 03/01/2022    9:26 PM 09/01/2021   12:05 PM 08/30/2020    8:18 AM 07/07/2020    6:29 AM  Advanced Directives  Does Patient Have a Medical Advance Directive? No No No No No Yes No  Type of Advance Directive      Healthcare Power of Attorney   Would patient like information on creating a medical advance directive? No - Patient declined Yes (MAU/Ambulatory/Procedural Areas - Information given)  No - Patient declined No - Patient declined  No - Patient declined    Current Medications (verified) Outpatient Encounter Medications as of 04/17/2024  Medication Sig   amLODipine  (NORVASC ) 5 MG tablet Take 1 tablet (5 mg total) by mouth daily.   aspirin EC 81 MG tablet Take 81 mg by mouth daily.   DULoxetine  (CYMBALTA ) 60 MG capsule TAKE 1 CAPSULE BY MOUTH EVERY DAY   ezetimibe  (ZETIA ) 10 MG tablet Take 1 tablet (10 mg total) by mouth daily.   fluticasone  (FLONASE ) 50 MCG/ACT nasal spray Nasal for 90   levocetirizine (XYZAL ) 5 MG tablet Take 1 tablet (5 mg total) by mouth every evening.   metFORMIN  (GLUCOPHAGE ) 500 MG tablet Take 1  tablet (500 mg total) by mouth 2 (two) times daily with a meal.   omeprazole (PRILOSEC) 20 MG capsule Take 20 mg by mouth daily.   pregabalin  (LYRICA ) 200 MG capsule TAKE 1 CAPSULE BY MOUTH TWICE A DAY   tadalafil (CIALIS) 5 MG tablet Take 5 mg by mouth daily.   topiramate  (TOPAMAX ) 25 MG tablet Take 1 tablet (25 mg total) by mouth daily as needed.   valsartan  (DIOVAN ) 160 MG tablet TAKE 1 TABLET BY MOUTH EVERY DAY   No  facility-administered encounter medications on file as of 04/17/2024.    Allergies (verified) Sulfamethoxazole   History: Past Medical History:  Diagnosis Date   Adenomatous polyp    Allergy     Arthritis    Atrial tachycardia (HCC)    Coronary artery calcification seen on CT scan 06/26/2023   Coronary artery Ca2+ (CT in 12/2017) Myoview 10/29/12: EF 57, no ischemia     Diverticulosis    Dupuytren's disease    palms and soles   GERD (gastroesophageal reflux disease)    Hypertension    Stroke [facility])    Past Surgical History:  Procedure Laterality Date   BACK SURGERY     12/2013   CARDIAC CATHETERIZATION  1990's   COLECTOMY  2010   for diverticulitis   COLON SURGERY N/A    [facility] 08/27/2020   COLONOSCOPY  04/03/2013   LASIK  2002   [facility]il   POLYPECTOMY     Family History  Problem Relation Age of Onset   Hypertension Mother    Macular degeneration Mother    Kidney disease Mother    Dementia Mother    Hypertension Father    Melanoma Sister    Stroke Other        uncle   Prostate cancer Other        uncle   Colon cancer Other        grandmother/uncle   Kidney disease Other        grandmother   Liver cancer Other        uncle (mets)   Heart disease Other        aunt/uncle   Lung cancer Other        uncle   Stomach cancer Cousin    Heart disease Cousin    Colon cancer Paternal Grandmother    Colon cancer Paternal Uncle    Prostate cancer Paternal Uncle    Arthritis Brother    Hypertension Brother    Esophageal cancer Neg Hx    Rectal cancer Neg Hx    Social History   Socioeconomic History   Marital status: Married    Spouse name: Ameir Faria   Number of children: 3   Years of education: Not on file   Highest education level: Bachelor's degree (e.g., BA, AB, BS)  Occupational History   Occupation: Retired   Occupation: retired  Tobacco Use   Smoking status: Former    Types: Cigars    Quit date: 2015    Years since quitting: 10.6   Smokeless  tobacco: Never   Tobacco comments:    Pt states smokes occasional cigar.  This is typically < 1 time per month  Vaping Use   Vaping status: Never Used  Substance and Sexual Activity   Alcohol use: No    Alcohol/week: 0.0 standard drinks of alcohol   Drug use: No   Sexual activity: Not on file  Other Topics Concern   Not on file  Social History  Narrative   Pt lives in [place].     Retired from [employer] and [employer].     Goes on mission trips to [place], gets regular exercise.   Caffeine use: very rare, Diet coke   Right handed    Social Drivers of Health   Financial Resource Strain: Low Risk  (04/17/2024)   Overall Financial Resource Strain (CARDIA)    Difficulty of Paying Living Expenses: Not hard at all  Food Insecurity: No Food Insecurity (04/17/2024)   Hunger Vital Sign    Worried About Running Out of Food in the Last Year: Never true    Ran Out of Food in the Last Year: Never true  Transportation Needs: No Transportation Needs (04/17/2024)   PRAPARE - Administrator, Civil Service (Medical): No    Lack of Transportation (Non-Medical): No  Physical Activity: Sufficiently Active (04/17/2024)   Exercise Vital Sign    Days of Exercise per Week: 7 days    Minutes of Exercise per Session: 30 min  Recent Concern: Physical Activity - Insufficiently Active (03/25/2024)   Exercise Vital Sign    Days of Exercise per Week: 3 days    Minutes of Exercise per Session: 20 min  Stress: No Stress Concern Present (04/17/2024)   [employer] of Occupational Health - Occupational Stress Questionnaire    Feeling of Stress: Not at all  Social Connections: Moderately Integrated (04/17/2024)   Social Connection and Isolation Panel    Frequency of Communication with Friends and Family: More than three times a week    Frequency of Social Gatherings with Friends and Family: More than three times a week    Attends Religious Services: More than 4 times per year    Active Member of  [employer] or Organizations: No    Attends Banker Meetings: Never    Marital Status: Married    Tobacco Counseling Counseling given: Not Answered Tobacco comments: Pt states smokes occasional cigar.  This is typically < 1 time per month    Clinical Intake:  Pre-visit preparation completed: Yes  Pain : 0-10 Pain Score: 5  Pain Type: Acute pain Pain Location: Leg Pain Orientation: Left Pain Descriptors / Indicators: Aching Pain Onset: Today Pain Frequency: Intermittent     BMI - recorded: 33.62 Nutritional Status: BMI of 19-24  Normal Nutritional Risks: None Diabetes: Yes CBG done?: No Did pt. bring in CBG monitor from home?: No  Lab Results  Component Value Date   HGBA1C 5.9 (H) 04/15/2024   HGBA1C 6.1 (H) 01/07/2024   HGBA1C 6.3 (H) 10/08/2023     How often do you need to have someone help you when you read instructions, pamphlets, or other written materials from your doctor or pharmacy?: 1 - Never  Interpreter Needed?: No  Information entered by :: Oneida Mckamey,CMA   Activities of Daily Living     04/17/2024   11:25 AM  In your present state of health, do you have any difficulty performing the following activities:  Hearing? 0  Vision? 0  Difficulty concentrating or making decisions? 0  Walking or climbing stairs? 0  Dressing or bathing? 0  Doing errands, shopping? 0  Preparing Food and eating ? N  Using the Toilet? N  In the past six months, have you accidently leaked urine? N  Do you have problems with loss of bowel control? N  Managing your Medications? N  Managing your Finances? N  Housekeeping or managing your Housekeeping? N    Patient  Care Team: Duanne Butler DASEN, MD as PCP - General (Family Medicine) Wendel Lurena POUR, MD as PCP - Cardiology (Cardiology) Kelsie Agent, MD (Inactive) as Attending Physician (Cardiology) Nicholaus Sherlean CROME, [facility] (Inactive) (Pharmacist) Patrcia Sharper, MD as Consulting Physician  (Ophthalmology) Lorilee Sven SQUIBB, MD as Consulting Physician (Physical Medicine and Rehabilitation) Joshua Sieving, MD as Consulting Physician (Dermatology) Lelon Glendia DASEN DEVONNA as Physician Assistant (Cardiology)  I have updated your Care Teams any recent Medical Services you may have received from other providers in the past year.     Assessment:   This is a routine wellness examination for Robert Page.  Hearing/Vision screen Hearing Screening - Comments:: Has lost some hearing but no hearing aids  Vision Screening - Comments:: No difficulties he has had surgery    Goals Addressed             This Visit's Progress    Patient Stated       To get A1c to a 5.5       Depression Screen     04/17/2024   11:27 AM 04/15/2024    9:55 AM 03/28/2024    2:00 PM 01/07/2024   10:36 AM 12/13/2023   11:59 AM 10/12/2023   11:28 AM 06/05/2023    9:13 AM  PHQ 2/9 Scores  PHQ - 2 Score 0 0 0 0 0 0 0  PHQ- 9 Score 0    2      Fall Risk     04/17/2024   11:26 AM 04/15/2024    9:55 AM 03/28/2024    2:00 PM 01/07/2024   10:36 AM 12/13/2023   11:59 AM  Fall Risk   Falls in the past year? 1 0 0 0 1  Number falls in past yr: 1  0 1 1  Injury with Fall? 1  0 0 0  Risk for fall due to : History of fall(s)  No Fall Risks    Follow up Falls evaluation completed  Falls evaluation completed      MEDICARE RISK AT HOME:  Medicare Risk at Home Any stairs in or around the home?: No If so, are there any without handrails?: No Home free of loose throw rugs in walkways, pet beds, electrical cords, etc?: Yes Adequate lighting in your home to reduce risk of falls?: Yes Life alert?: No Use of a cane, walker or w/c?: No Grab bars in the bathroom?: No Shower chair or bench in shower?: No Elevated toilet seat or a handicapped toilet?: No  TIMED UP AND GO:  Was the test performed?  No  Cognitive Function: 6CIT completed        04/17/2024   11:24 AM 04/12/2023    1:03 PM 08/30/2020    8:19 AM  6CIT  Screen  What Year? 0 points 0 points 0 points  What month? 0 points 0 points 0 points  What time? 0 points 0 points 0 points  Count back from 20 0 points 0 points 0 points  Months in reverse 0 points 0 points 0 points  Repeat phrase 0 points 0 points 0 points  Total Score 0 points 0 points 0 points    Immunizations Immunization History  Administered Date(s) Administered   Fluad Quad(high Dose 65+) 08/10/2020   H1N1 09/23/2008   Influenza Split 06/04/2014   Influenza,inj,Quad PF,6+ Mos 08/08/2013, 05/17/2015, 10/17/2016   PFIZER(Purple Top)SARS-COV-2 Vaccination 09/25/2019, 10/16/2019, 08/30/2020   Pneumococcal Conjugate Pcv21, Polysaccharide Crm197 Conjugaf 10/12/2023   Pneumococcal Conjugate-13 10/17/2016   Pneumococcal  Polysaccharide-23 03/11/2013   Td 03/04/2004   Tdap 10/17/2016    Screening Tests Health Maintenance  Topic Date Due   Zoster Vaccines- Shingrix (1 of 2) Never done   COVID-19 Vaccine (4 - 2024-25 season) 05/06/2023   INFLUENZA VACCINE  04/04/2024   Hepatitis C Screening  10/11/2024 (Originally 09/20/1963)   Medicare Annual Wellness (AWV)  04/17/2025   DTaP/Tdap/Td (3 - Td or Tdap) 10/17/2026   Pneumococcal Vaccine: 50+ Years  Completed   HPV VACCINES  Aged Out   Meningococcal B Vaccine  Aged Out   Pneumococcal Vaccine  Discontinued   Colonoscopy  Discontinued    Health Maintenance  Health Maintenance Due  Topic Date Due   Zoster Vaccines- Shingrix (1 of 2) Never done   COVID-19 Vaccine (4 - 2024-25 season) 05/06/2023   INFLUENZA VACCINE  04/04/2024   Health Maintenance Items Addressed:patient declined vaccinations   Additional Screening:  Vision Screening: Recommended annual ophthalmology exams for early detection of glaucoma and other disorders of the eye. Would you like a referral to an eye doctor? No    Dental Screening: Recommended annual dental exams for proper oral hygiene  Community Resource Referral / Chronic Care Management: CRR  required this visit?  No   CCM required this visit?  No   Plan:    I have personally reviewed and noted the following in the patient's chart:   Medical and social history Use of alcohol, tobacco or illicit drugs  Current medications and supplements including opioid prescriptions. Patient is not currently taking opioid prescriptions. Functional ability and status Nutritional status Physical activity Advanced directives List of other physicians Hospitalizations, surgeries, and ER visits in previous 12 months Vitals Screenings to include cognitive, depression, and falls Referrals and appointments  In addition, I have reviewed and discussed with patient certain preventive protocols, quality metrics, and best practice recommendations. A written personalized care plan for preventive services as well as general preventive health recommendations were provided to patient.   Lyle MARLA Right, [place]   04/17/2024   After Visit Summary: (MyChart) Due to this being a telephonic visit, the after visit summary with patients personalized plan was offered to patient via MyChart   Notes: Nothing significant to report at this time.

## 2024-04-22 DIAGNOSIS — S8012XA Contusion of left lower leg, initial encounter: Secondary | ICD-10-CM | POA: Insufficient documentation

## 2024-04-25 ENCOUNTER — Ambulatory Visit: Payer: Self-pay

## 2024-04-25 ENCOUNTER — Emergency Department (HOSPITAL_COMMUNITY)

## 2024-04-25 ENCOUNTER — Emergency Department (HOSPITAL_COMMUNITY)
Admission: EM | Admit: 2024-04-25 | Discharge: 2024-04-25 | Disposition: A | Discharge status: Home / Self Care | Source: Ambulatory Visit | Attending: Emergency Medicine | Admitting: Emergency Medicine

## 2024-04-25 ENCOUNTER — Encounter (HOSPITAL_COMMUNITY): Payer: Self-pay

## 2024-04-25 ENCOUNTER — Other Ambulatory Visit: Payer: Self-pay

## 2024-04-25 DIAGNOSIS — Z7984 Long term (current) use of oral hypoglycemic drugs: Secondary | ICD-10-CM | POA: Diagnosis not present

## 2024-04-25 DIAGNOSIS — R600 Localized edema: Secondary | ICD-10-CM | POA: Insufficient documentation

## 2024-04-25 DIAGNOSIS — Z7982 Long term (current) use of aspirin: Secondary | ICD-10-CM | POA: Diagnosis not present

## 2024-04-25 DIAGNOSIS — S8012XA Contusion of left lower leg, initial encounter: Secondary | ICD-10-CM | POA: Diagnosis not present

## 2024-04-25 DIAGNOSIS — X500XXA Overexertion from strenuous movement or load, initial encounter: Secondary | ICD-10-CM | POA: Diagnosis not present

## 2024-04-25 DIAGNOSIS — Z79899 Other long term (current) drug therapy: Secondary | ICD-10-CM | POA: Diagnosis not present

## 2024-04-25 DIAGNOSIS — S8992XA Unspecified injury of left lower leg, initial encounter: Secondary | ICD-10-CM | POA: Diagnosis present

## 2024-04-25 DIAGNOSIS — N62 Hypertrophy of breast: Secondary | ICD-10-CM | POA: Insufficient documentation

## 2024-04-25 DIAGNOSIS — M7989 Other specified soft tissue disorders: Secondary | ICD-10-CM | POA: Diagnosis not present

## 2024-04-25 LAB — URINALYSIS, ROUTINE W REFLEX MICROSCOPIC
Bilirubin Urine: NEGATIVE
Glucose, UA: NEGATIVE mg/dL
Hgb urine dipstick: NEGATIVE
Ketones, ur: NEGATIVE mg/dL
Leukocytes,Ua: NEGATIVE
Nitrite: NEGATIVE
Protein, ur: NEGATIVE mg/dL
Specific Gravity, Urine: 1.013 (ref 1.005–1.030)
pH: 6 (ref 5.0–8.0)

## 2024-04-25 LAB — CBC WITH DIFFERENTIAL/PLATELET
Abs Immature Granulocytes: 0.02 K/uL (ref 0.00–0.07)
Basophils Absolute: 0.1 K/uL (ref 0.0–0.1)
Basophils Relative: 1 %
Eosinophils Absolute: 0.4 K/uL (ref 0.0–0.5)
Eosinophils Relative: 7 %
HCT: 41 % (ref 39.0–52.0)
Hemoglobin: 13.7 g/dL (ref 13.0–17.0)
Immature Granulocytes: 0 %
Lymphocytes Relative: 22 %
Lymphs Abs: 1.3 K/uL (ref 0.7–4.0)
MCH: 29.8 pg (ref 26.0–34.0)
MCHC: 33.4 g/dL (ref 30.0–36.0)
MCV: 89.3 fL (ref 80.0–100.0)
Monocytes Absolute: 0.6 K/uL (ref 0.1–1.0)
Monocytes Relative: 10 %
Neutro Abs: 3.5 K/uL (ref 1.7–7.7)
Neutrophils Relative %: 60 %
Platelets: 226 K/uL (ref 150–400)
RBC: 4.59 MIL/uL (ref 4.22–5.81)
RDW: 13.9 % (ref 11.5–15.5)
WBC: 5.8 K/uL (ref 4.0–10.5)
nRBC: 0 % (ref 0.0–0.2)

## 2024-04-25 LAB — COMPREHENSIVE METABOLIC PANEL WITH GFR
ALT: 16 U/L (ref 0–44)
AST: 16 U/L (ref 15–41)
Albumin: 3.7 g/dL (ref 3.5–5.0)
Alkaline Phosphatase: 60 U/L (ref 38–126)
Anion gap: 10 (ref 5–15)
BUN: 17 mg/dL (ref 8–23)
CO2: 23 mmol/L (ref 22–32)
Calcium: 8.6 mg/dL — ABNORMAL LOW (ref 8.9–10.3)
Chloride: 108 mmol/L (ref 98–111)
Creatinine, Ser: 0.92 mg/dL (ref 0.61–1.24)
GFR, Estimated: >60 mL/min (ref >60)
Glucose, Bld: 99 mg/dL (ref 70–99)
Potassium: 4.6 mmol/L (ref 3.5–5.1)
Sodium: 141 mmol/L (ref 135–145)
Total Bilirubin: 0.6 mg/dL (ref 0.0–1.2)
Total Protein: 6.4 g/dL — ABNORMAL LOW (ref 6.5–8.1)

## 2024-04-25 LAB — TROPONIN I (HIGH SENSITIVITY): Troponin I (High Sensitivity): 6 ng/L (ref <18)

## 2024-04-25 LAB — BRAIN NATRIURETIC PEPTIDE: B Natriuretic Peptide: 86 pg/mL (ref 0.0–100.0)

## 2024-04-25 NOTE — ED Triage Notes (Signed)
 Pt stated that he hit his left leg 2 weeks ago and saw no changes, but noticed new bruising from his knee down to his toes last week. Pt also stated that his left hand was severely swollen this morning and his daughter told him that his face also look swoleen. Pt stated that he had xrays done at [facility] when he noticed the bruising but xrays showed nothing abnormal.

## 2024-04-25 NOTE — Telephone Encounter (Signed)
 FYI Only or Action Required?: FYI only for provider.  Patient was last seen in primary care on 03/28/2024 by Robert Butler DASEN, MD.  Called Nurse Triage reporting Fall.  Symptoms began several weeks ago.  Interventions attempted: OTC medications: ibuprofen.  Symptoms are: gradually worsening.  Triage Disposition: Go to ED Now (Notify PCP)  Patient/caregiver understands and will follow disposition?: YesCopied from CRM #[id number]. Topic: Clinical - Red Word Triage >> Apr 25, 2024 10:09 AM Robert Page wrote: Red Word that prompted transfer to Nurse Triage: Pt fell two weeks ago. Pt is experiencing swelling to his hands, foot, and face. The pt have red streaks around his ankle. Reason for Disposition  Injury (or injuries) that need emergency care  Answer Assessment - Initial Assessment Questions Pt was working several weeks ago with equipment. There was a malfunction, board hit left knee, and pt fell back on left hip. Pt went to ortho [facility] and was told to take ibuprofen. Pt is now experiencing swollen face, left foot is turning purple and swollen, and left calf is red. Pt stated my forehead feels stiff and now left arm is swollen. RN advised ED. Pt stated he will go.          1. MECHANISM: How did the fall happen?     Working on equipment and  2. DOMESTIC VIOLENCE AND ELDER ABUSE SCREENING: Did you fall because someone pushed you or tried to hurt you? If Yes, ask: Are you safe now?     na 3. ONSET: When did the fall happen? (e.g., minutes, hours, or days ago)     2 weeks ago  4. LOCATION: What part of the body hit the ground? (e.g., back, buttocks, head, hips, knees, hands, head, stomach)     Left hip 5. INJURY: Did you hurt (injure) yourself when you fell? If Yes, ask: What did you injure? Tell me more about this? (e.g., body area; type of injury; pain severity)     Yes, 6. PAIN: Is there any pain? If Yes, ask: How bad is the pain? (e.g., Scale 0-10; or none,  mild,      Face feels stretched. Left foot pain-4 7. SIZE: For cuts, bruises, or swelling, ask: How large is it? (e.g., inches or centimeters)       swells a lot   9. OTHER SYMPTOMS: Do you have any other symptoms? (e.g., dizziness, fever, weakness; new-onset or worsening).      Calf swells a lot during day 10. CAUSE: What do you think caused the fall (or falling)? (e.g., dizzy spell, tripped)       Equipment malfunction and fell back  Protocols used: Falls and [facility]

## 2024-04-25 NOTE — Discharge Instructions (Signed)
 Please follow-up closely with your primary care doctor on an outpatient basis for further evaluation and workup of the previous gynecomastia noted on your CT scan.  Recommendation at that time was for ultrasound versus mammogram.  Return to emergency department immediately for any new or worsening symptoms.

## 2024-04-25 NOTE — ED Provider Notes (Signed)
 [place] EMERGENCY DEPARTMENT AT [facility] Provider Note   CSN: [id number] Arrival date & time: 04/25/24  1055     Patient presents with: Leg Swelling   BLADYN TIPPS is a 78 y.o. male.   Patient is a 78 year old male who presents to the emergency department chief complaint of swelling and bruising to his left lower extremity from the knee down.  He notes that roughly 2 weeks ago he hit his leg while moving a heavy object.  Patient notes that he was seen at [facility] following that during which time x-rays were performed which were unremarkable.  Per their note he was offered venous duplex at that point but he did decline.  He did present today as he has had ongoing bruising but notes that the swelling has improved.  He notes that he was concerned given the continued symptoms as he does have a trip planned in the near future.  He also notes this morning he felt as though he had some swelling to his face and left hand.  He denies any chest pain, shortness of breath, Donnell pain, nausea, vomiting, diarrhea.  He denies any numbness or paresthesias.        Prior to Admission medications   Medication Sig Start Date End Date Taking? Authorizing Provider  amLODipine  (NORVASC ) 5 MG tablet Take 1 tablet (5 mg total) by mouth daily. 01/03/24  Yes Duanne Butler DASEN, MD  aspirin EC 81 MG tablet Take 81 mg by mouth daily.   Yes [provider]  DM-Doxylamine-Acetaminophen  (NYQUIL COLD & FLU PO) Take 1 Dose by mouth every 4 (four) hours as needed (Cough/Cold).   Yes [provider]  DULoxetine  (CYMBALTA ) 60 MG capsule TAKE 1 CAPSULE BY MOUTH EVERY DAY 04/07/24  Yes Raulkar, Sven SQUIBB, MD  ezetimibe  (ZETIA ) 10 MG tablet Take 1 tablet (10 mg total) by mouth daily. 07/17/23 04/25/24 Yes Weaver, Scott T, PA-C  ibuprofen (ADVIL) 200 MG tablet Take 400 mg by mouth every 6 (six) hours as needed for moderate pain (pain score 4-6).   Yes [provider]  levocetirizine  (XYZAL ) 5 MG tablet Take 1 tablet (5 mg total) by mouth every evening. 01/03/24  Yes Duanne Butler DASEN, MD  metFORMIN  (GLUCOPHAGE ) 500 MG tablet Take 1 tablet (500 mg total) by mouth 2 (two) times daily with a meal. 01/15/24  Yes Raulkar, Sven SQUIBB, MD  omeprazole (PRILOSEC) 20 MG capsule Take 20 mg by mouth daily.   Yes [provider]  pregabalin  (LYRICA ) 200 MG capsule TAKE 1 CAPSULE BY MOUTH TWICE A DAY 01/02/24  Yes Raulkar, Krutika P, MD  tadalafil (CIALIS) 5 MG tablet Take 5 mg by mouth daily. 08/11/22  Yes [provider]  topiramate  (TOPAMAX ) 25 MG tablet Take 1 tablet (25 mg total) by mouth daily as needed. Patient taking differently: Take 25 mg by mouth daily. 10/08/23  Yes Raulkar, Sven SQUIBB, MD  valsartan  (DIOVAN ) 160 MG tablet TAKE 1 TABLET BY MOUTH EVERY DAY 01/01/24  Yes Duanne Butler DASEN, MD    Allergies: Sulfamethoxazole    Review of Systems  Musculoskeletal:        Swelling and bruising to left lower extremity  All other systems reviewed and are negative.   Updated Vital Signs BP (!) 169/81   Pulse (!) 50   Temp 98.4 F (36.9 C)   Resp 13   Ht 5' 10 (1.778 m)   Wt 104.7 kg   SpO2 91%   BMI  33.12 kg/m   Physical Exam Vitals and nursing note reviewed.  Constitutional:      Appearance: Normal appearance.  HENT:     Head: Normocephalic and atraumatic.     Nose: Nose normal.     Mouth/Throat:     Mouth: Mucous membranes are moist.  Eyes:     Extraocular Movements: Extraocular movements intact.     Conjunctiva/sclera: Conjunctivae normal.     Pupils: Pupils are equal, round, and reactive to light.  Cardiovascular:     Rate and Rhythm: Normal rate and regular rhythm.     Pulses: Normal pulses.     Heart sounds: Normal heart sounds. No murmur heard.    No gallop.  Pulmonary:     Effort: Pulmonary effort is normal. No respiratory distress.     Breath sounds: Normal breath sounds. No stridor. No wheezing, rhonchi or rales.  Abdominal:      General: Abdomen is flat. Bowel sounds are normal. There is no distension.     Palpations: Abdomen is soft.     Tenderness: There is no abdominal tenderness. There is no guarding.  Musculoskeletal:        General: Normal range of motion.     Cervical back: Normal range of motion and neck supple.     Comments: Ecchymosis noted to the left tib-fib region and left foot, DP and PT pulses are 2+ distally, sensation intact distally, full range of motion noted throughout, no overlying erythema, warmth, induration, fluctuance, abrasion, laceration, ulceration, nontender palpation of remainder of long bones and joints, pelvis stable to AP and lateral compression  Skin:    General: Skin is warm and dry.  Neurological:     General: No focal deficit present.     Mental Status: He is alert and oriented to person, place, and time. Mental status is at baseline.  Psychiatric:        Mood and Affect: Mood normal.        Behavior: Behavior normal.        Thought Content: Thought content normal.        Judgment: Judgment normal.     (all labs ordered are listed, but only abnormal results are displayed) Labs Reviewed  COMPREHENSIVE METABOLIC PANEL WITH GFR - Abnormal; Notable for the following components:      Result Value   Calcium  8.6 (*)    Total Protein 6.4 (*)    All other components within normal limits  CBC WITH DIFFERENTIAL/PLATELET  BRAIN NATRIURETIC PEPTIDE  URINALYSIS, ROUTINE W REFLEX MICROSCOPIC  TROPONIN I (HIGH SENSITIVITY)    EKG: EKG Interpretation Date/Time:  Friday April 25 2024 11:47:23 EDT Ventricular Rate:  52 PR Interval:  178 QRS Duration:  114 QT Interval:  418 QTC Calculation: 389 R Axis:   61  Text Interpretation: Sinus rhythm Borderline intraventricular conduction delay Low voltage, precordial leads Minimal ST depression, inferior leads No acute changes No significant change since last tracing Confirmed by Charlyn Sora ([id number]) on 04/25/2024 1:03:08  PM  Radiology: [facility] Chest Port 1 View Result Date: 04/25/2024 CLINICAL DATA:  Edema. EXAM: PORTABLE CHEST 1 VIEW COMPARISON:  Radiograph 09/15/2022 FINDINGS: The cardiomediastinal contours are stable. Aortic atherosclerosis. Pulmonary vasculature is normal. No consolidation, pleural effusion, or pneumothorax. No acute osseous abnormalities are seen. IMPRESSION: No active disease. Electronically Signed   By: Andrea Gasman M.D.   On: 04/25/2024 13:34   DG Tibia/Fibula Left Result Date: 04/25/2024 CLINICAL DATA:  Swelling and bruising, post blunt injury. EXAM: LEFT  TIBIA AND FIBULA - 2 VIEW COMPARISON:  None Available. FINDINGS: There is no evidence of fracture or other focal bone lesions. No erosions or periostitis. Knee and ankle alignment are maintained. Generalized soft tissue edema. No radiopaque foreign body or soft tissue gas. IMPRESSION: Soft tissue edema. No acute osseous abnormality. Electronically Signed   By: Andrea Gasman M.D.   On: 04/25/2024 13:34   US  Venous Img Lower  Left (DVT Study) Result Date: 04/25/2024 CLINICAL DATA:  Left leg swelling for approximately 1 week with color changes as well as varicose veins and spider veins involving the left leg. EXAM: Left LOWER EXTREMITY VENOUS DOPPLER ULTRASOUND TECHNIQUE: Gray-scale sonography with compression, as well as color and duplex ultrasound, were performed to evaluate the deep venous system(s) from the level of the common femoral vein through the popliteal and proximal calf veins. COMPARISON:  None Available. FINDINGS: VENOUS Normal compressibility of the common femoral, superficial femoral, and popliteal veins, as well as the visualized calf veins. Visualized portions of profunda femoral vein and great saphenous vein unremarkable. No filling defects to suggest DVT on grayscale or color Doppler imaging. Doppler waveforms show normal direction of venous flow, normal respiratory plasticity and response to augmentation. Limited views of the  contralateral common femoral vein are unremarkable. OTHER None. Limitations: none IMPRESSION: Negative for left lower extremity DVT Electronically Signed   By: Cordella Banner   On: 04/25/2024 11:56     Procedures   Medications Ordered in the ED - No data to display                                  Medical Decision Making Amount and/or Complexity of Data Reviewed Labs: ordered. Radiology: ordered.   This patient presents to the ED for concern of bruising and swelling to left lower extremity differential diagnosis includes contusion, cellulitis, abscess, peripheral arterial occlusion, DVT, CHF    Additional history obtained:  Additional history obtained from medical records External records from outside source obtained and reviewed including medical records   Lab Tests:  I Ordered, and personally interpreted labs.  The pertinent results include: No leukocytosis, no anemia, normal kidney function liver function, normal electrolytes, negative troponin, negative BNP, unremarkable urinalysis   Imaging Studies ordered:  I ordered imaging studies including venous duplex of left lower extremity, x-ray of left tib-fib, chest x-ray I independently visualized and interpreted imaging which showed no acute DVT, no acute osseous injury or lesions of the left tib-fib, no acute cardiopulmonary process I agree with the radiologist interpretation    Problem List / ED Course:  Patient is doing well at this time and is stable for discharge home.  Discussed with patient of the need for continued close follow-up with his primary care doctor on outpatient basis.  On evaluation of previous CT scan it was noted that he had gynecomastia with recommendation for ultrasound versus mammogram.  He notes that he has not followed up for this and he was directed to do so and discuss this with his primary care doctor.  Venous duplex demonstrated no indication for DVT.  Blood work is overall been  unremarkable.  Do not suspect CHF, nephrotic syndrome or nephrotic syndrome.  He is otherwise neurovascularly intact throughout with no indication for acute arterial occlusion.  Do not suspect any further advanced imaging or workup is warranted in the emergency department.  Do not specked admission is warranted at this time.  Strict turn precautions were discussed for any new or worsening symptoms.  Patient voiced understanding and had no additional questions.   Social Determinants of Health:  None        Final diagnoses:  Contusion of left lower extremity, initial encounter  Gynecomastia    ED Discharge Orders     None          Daralene Lonni JONETTA DEVONNA 04/25/24 1352    Charlyn Sora, MD 04/27/24 [id number]

## 2024-05-06 ENCOUNTER — Encounter (INDEPENDENT_AMBULATORY_CARE_PROVIDER_SITE_OTHER): Payer: Self-pay | Admitting: Otolaryngology

## 2024-05-06 ENCOUNTER — Ambulatory Visit (INDEPENDENT_AMBULATORY_CARE_PROVIDER_SITE_OTHER): Admitting: Otolaryngology

## 2024-05-06 ENCOUNTER — Encounter: Payer: Self-pay | Admitting: Family Medicine

## 2024-05-06 ENCOUNTER — Ambulatory Visit: Admitting: Family Medicine

## 2024-05-06 VITALS — BP 130/64 | HR 79 | Temp 97.7°F | Ht 70.0 in | Wt 232.8 lb

## 2024-05-06 VITALS — BP 161/83 | HR 71 | Ht 70.0 in | Wt 232.0 lb

## 2024-05-06 DIAGNOSIS — N62 Hypertrophy of breast: Secondary | ICD-10-CM | POA: Diagnosis not present

## 2024-05-06 DIAGNOSIS — K219 Gastro-esophageal reflux disease without esophagitis: Secondary | ICD-10-CM | POA: Diagnosis not present

## 2024-05-06 DIAGNOSIS — R1314 Dysphagia, pharyngoesophageal phase: Secondary | ICD-10-CM

## 2024-05-06 DIAGNOSIS — Z6831 Body mass index (BMI) 31.0-31.9, adult: Secondary | ICD-10-CM | POA: Insufficient documentation

## 2024-05-06 NOTE — Progress Notes (Signed)
 Subjective:    Patient ID: Robert Page, male    DOB: 12-May-1946, 78 y.o.   MRN: [id number] CT scan 7/24 showed asymmetric left gynecomastia.  Was reminded at er visit recently.  Would now lie to pursue mammogram.   Past Medical History:  Diagnosis Date  . Adenomatous polyp   . Allergy    . Arthritis   . Atrial tachycardia (HCC)   . Coronary artery calcification seen on CT scan 06/26/2023   Coronary artery Ca2+ (CT in 12/2017) Myoview 10/29/12: EF 57, no ischemia    . Diverticulosis   . Dupuytren's disease    palms and soles  . GERD (gastroesophageal reflux disease)   . Hypertension   . Stroke [facility])    Past Surgical History:  Procedure Laterality Date  . BACK SURGERY     12/2013  . CARDIAC CATHETERIZATION  1990's  . COLECTOMY  2010   for diverticulitis  . COLON SURGERY N/A    [employer] 08/27/2020  . COLONOSCOPY  04/03/2013  . LASIK  2002   Bil  . POLYPECTOMY     Current Outpatient Medications on File Prior to Visit  Medication Sig Dispense Refill  . amLODipine  (NORVASC ) 5 MG tablet Take 1 tablet (5 mg total) by mouth daily. 90 tablet 1  . aspirin EC 81 MG tablet Take 81 mg by mouth daily.    SABRA DM-Doxylamine-Acetaminophen  (NYQUIL COLD & FLU PO) Take 1 Dose by mouth every 4 (four) hours as needed (Cough/Cold).    . DULoxetine  (CYMBALTA ) 60 MG capsule TAKE 1 CAPSULE BY MOUTH EVERY DAY 90 capsule 3  . ezetimibe  (ZETIA ) 10 MG tablet Take 1 tablet (10 mg total) by mouth daily. 90 tablet 3  . ibuprofen (ADVIL) 200 MG tablet Take 400 mg by mouth every 6 (six) hours as needed for moderate pain (pain score 4-6).    SABRA levocetirizine (XYZAL ) 5 MG tablet Take 1 tablet (5 mg total) by mouth every evening. 90 tablet 0  . metFORMIN  (GLUCOPHAGE ) 500 MG tablet Take 1 tablet (500 mg total) by mouth 2 (two) times daily with a meal. 90 tablet 3  . omeprazole (PRILOSEC) 20 MG capsule Take 20 mg by mouth daily.    . pregabalin  (LYRICA ) 200 MG capsule TAKE 1 CAPSULE BY MOUTH TWICE A DAY 180  capsule 3  . tadalafil (CIALIS) 5 MG tablet Take 5 mg by mouth daily.    . topiramate  (TOPAMAX ) 25 MG tablet Take 1 tablet (25 mg total) by mouth daily as needed. (Patient taking differently: Take 25 mg by mouth daily.) 60 tablet 3  . valsartan  (DIOVAN ) 160 MG tablet TAKE 1 TABLET BY MOUTH EVERY DAY 90 tablet 2   No current facility-administered medications on file prior to visit.   Allergies  Allergen Reactions  . Sulfamethoxazole Other (See Comments)    other   Social History   Socioeconomic History  . Marital status: Married    Spouse name: Smith Potenza  . Number of children: 3  . Years of education: Not on file  . Highest education level: Bachelor's degree (e.g., BA, AB, BS)  Occupational History  . Occupation: Retired  . Occupation: retired  Tobacco Use  . Smoking status: Former    Types: Cigars    Quit date: 2015    Years since quitting: 10.6  . Smokeless tobacco: Never  . Tobacco comments:    Pt states smokes occasional cigar.  This is typically < 1 time per month  Vaping Use  .  Vaping status: Never Used  Substance and Sexual Activity  . Alcohol use: No    Alcohol/week: 0.0 standard drinks of alcohol  . Drug use: No  . Sexual activity: Not on file  Other Topics Concern  . Not on file  Social History Narrative   [place] lives in [place].     Retired from [employer] and [employer].     Goes on mission trips to [place], gets regular exercise.   Caffeine use: very rare, Diet coke   Right handed    Social Drivers of Health   Financial Resource Strain: Low Risk  (04/17/2024)   Overall Financial Resource Strain (CARDIA)   . Difficulty of Paying Living Expenses: Not hard at all  Food Insecurity: No Food Insecurity (04/17/2024)   Hunger Vital Sign   . Worried About Programme researcher, broadcasting/film/video in the Last Year: Never true   . Ran Out of Food in the Last Year: Never true  Transportation Needs: No Transportation Needs (04/17/2024)   PRAPARE - Transportation   . Lack of  Transportation (Medical): No   . Lack of Transportation (Non-Medical): No  Physical Activity: Sufficiently Active (04/17/2024)   Exercise Vital Sign   . Days of Exercise per Week: 7 days   . Minutes of Exercise per Session: 30 min  Recent Concern: Physical Activity - Insufficiently Active (03/25/2024)   Exercise Vital Sign   . Days of Exercise per Week: 3 days   . Minutes of Exercise per Session: 20 min  Stress: No Stress Concern Present (04/17/2024)   [employer] of Occupational Health - Occupational Stress Questionnaire   . Feeling of Stress: Not at all  Social Connections: Moderately Integrated (04/17/2024)   Social Connection and Isolation Panel   . Frequency of Communication with Friends and Family: More than three times a week   . Frequency of Social Gatherings with Friends and Family: More than three times a week   . Attends Religious Services: More than 4 times per year   . Active Member of Clubs or Organizations: No   . Attends Banker Meetings: Never   . Marital Status: Married  Catering manager Violence: Not At Risk (04/17/2024)   Humiliation, Afraid, Rape, and Kick questionnaire   . Fear of Current or Ex-Partner: No   . Emotionally Abused: No   . Physically Abused: No   . Sexually Abused: No   Family History  Problem Relation Age of Onset  . Hypertension Mother   . Macular degeneration Mother   . Kidney disease Mother   . Dementia Mother   . Hypertension Father   . Melanoma Sister   . Stroke Other        uncle  . Prostate cancer Other        uncle  . Colon cancer Other        grandmother/uncle  . Kidney disease Other        grandmother  . Liver cancer Other        uncle (mets)  . Heart disease Other        aunt/uncle  . Lung cancer Other        uncle  . Stomach cancer Cousin   . Heart disease Cousin   . Colon cancer Paternal Grandmother   . Colon cancer Paternal Uncle   . Prostate cancer Paternal Uncle   . Arthritis Brother   .  Hypertension Brother   . Esophageal cancer Neg Hx   . Rectal cancer Neg  Hx      Review of Systems  All other systems reviewed and are negative.      Objective:   Physical Exam Vitals reviewed.  Constitutional:      General: He is not in acute distress.    Appearance: He is well-developed. He is not diaphoretic.  HENT:     Head: Normocephalic and atraumatic.  Neck:     Thyroid : No thyromegaly.     Vascular: No JVD.     Trachea: No tracheal deviation.  Cardiovascular:     Rate and Rhythm: Normal rate and regular rhythm.     Heart sounds: Normal heart sounds. No murmur heard.    No friction rub. No gallop.  Pulmonary:     Effort: Pulmonary effort is normal. No respiratory distress.     Breath sounds: No stridor. No wheezing or rales.  Chest:     Chest wall: No tenderness.    Lymphadenopathy:     Cervical: No cervical adenopathy.  Skin:    General: Skin is warm.     Coloration: Skin is not pale.     Findings: No erythema or rash.  Neurological:     Mental Status: He is alert.     Motor: No abnormal muscle tone.     Deep Tendon Reflexes: Reflexes are normal and symmetric.           Assessment & Plan:  Gynecomastia - Plan: MM Digital Screening Unilat L Exam is normal.  Will get mammogram hopefully to ease patients concerns.

## 2024-05-06 NOTE — Progress Notes (Signed)
 Dear Dr. Duanne, Here is my assessment for our mutual patient, Robert Page. Thank you for allowing me the opportunity to care for your patient. Please do not hesitate to contact me should you have any other questions. Sincerely, Dr. Eldora Blanch  Otolaryngology Clinic Note Referring provider: Dr. Duanne HPI:  Mena Lienau is a 78 y.o. male kindly referred by Dr. Duanne for evaluation of dysphagia.  Initial visit (02/2024): Patient reports: several year history of dysphagia, particularly with solid foods (fries, potatoes, salad). He will sometimes try to cough and get it up but no regurgitation. It does sometimes irritates him. No worse. Just has to be careful eating certain foods. No coughing with eating. No regurgitation, bad breath. Has seen GI before, and underwent EGD/Dilation in 2024.  Does not drink water with his food.  No recent intubations. Denies GERD sx including belching, epigastric discomfort. Uses PPI.  Patient otherwise denies: - odynophagia, aspiration episodes or PNA, need for Heimlich, unintentional weight loss - changes in voice, shortness of breath, hemoptysis - ear pain, neck masses  --------------------------------------------------------- 05/06/2024 He reports that he is overall doing some better. Viveca and salad bother him some but otherwise no issues. He did have a swallow test which we discussed. No aspiration. Maintaining weight. He had several questions which I answered. He reports that his prior dilation did not help.   H&N Surgery: no Personal or FHx of bleeding dz or anesthesia difficulty: no  AP/AC: ASA 81  Tobacco: rare cigar use, quit 15 years ago. Alcohol: no  PMHx: HLD, HTN, A-fib, Neuropathy, GERD, PreDM, h/o CVA  Independent Review of Additional Tests or Records:  Jeoffrey Barrio (12/13/2023): noted sore throat and difficulty eating; no other sx; no difficulty swallowing; intermittent dysphonia; food sticking; prior seen GI; Dx: Dysphagia;  Pharyngitis; Rx: ref to ENT Labs: CBC 10/12/2023: WBC 5.6 Colleen Kennedy-Smith (04/13/2023) GI: noted difficulty swallowing with food sticking if eating bread; prior ENT eval unrevealing; some cough/gagging, no regurg; om PPI; Dx: Dysphagia and GERD; Rx: EGD CT Chest 09/28/2022: patent airway; noted stable mild thyroid  goiter and right thyroid  nodule. EGD 06/19/2023:   MBS and Esophagram 03/13/2024 independently interpreted: noted essentially normal swallow without aspiration; no tertiary contractions, slight delay at LES PMH/Meds/All/SocHx/FamHx/ROS:   Past Medical History:  Diagnosis Date   Adenomatous polyp    Allergy     Arthritis    Atrial tachycardia (HCC)    Coronary artery calcification seen on CT scan 06/26/2023   Coronary artery Ca2+ (CT in 12/2017) Myoview 10/29/12: EF 57, no ischemia     Diverticulosis    Dupuytren's disease    palms and soles   GERD (gastroesophageal reflux disease)    Hypertension    Stroke [facility])      Past Surgical History:  Procedure Laterality Date   BACK SURGERY     12/2013   CARDIAC CATHETERIZATION  1990's   COLECTOMY  2010   for diverticulitis   COLON SURGERY N/A    [employer] 08/27/2020   COLONOSCOPY  04/03/2013   LASIK  2002   Bil   POLYPECTOMY      Family History  Problem Relation Age of Onset   Hypertension Mother    Macular degeneration Mother    Kidney disease Mother    Dementia Mother    Hypertension Father    Melanoma Sister    Stroke Other        uncle   Prostate cancer Other        uncle   Colon  cancer Other        grandmother/uncle   Kidney disease Other        grandmother   Liver cancer Other        uncle (mets)   Heart disease Other        aunt/uncle   Lung cancer Other        uncle   Stomach cancer Cousin    Heart disease Cousin    Colon cancer Paternal Grandmother    Colon cancer Paternal Uncle    Prostate cancer Paternal Uncle    Arthritis Brother    Hypertension Brother    Esophageal cancer Neg Hx     Rectal cancer Neg Hx      Social Connections: Moderately Integrated (04/17/2024)   Social Connection and Isolation Panel    Frequency of Communication with Friends and Family: More than three times a week    Frequency of Social Gatherings with Friends and Family: More than three times a week    Attends Religious Services: More than 4 times per year    Active Member of [employer] or Organizations: No    Attends Banker Meetings: Never    Marital Status: Married      Current Outpatient Medications:    amLODipine  (NORVASC ) 5 MG tablet, Take 1 tablet (5 mg total) by mouth daily., Disp: 90 tablet, Rfl: 1   aspirin EC 81 MG tablet, Take 81 mg by mouth daily., Disp: , Rfl:    DM-Doxylamine-Acetaminophen  (NYQUIL COLD & FLU PO), Take 1 Dose by mouth every 4 (four) hours as needed (Cough/Cold)., Disp: , Rfl:    DULoxetine  (CYMBALTA ) 60 MG capsule, TAKE 1 CAPSULE BY MOUTH EVERY DAY, Disp: 90 capsule, Rfl: 3   ezetimibe  (ZETIA ) 10 MG tablet, Take 1 tablet (10 mg total) by mouth daily., Disp: 90 tablet, Rfl: 3   ibuprofen (ADVIL) 200 MG tablet, Take 400 mg by mouth every 6 (six) hours as needed for moderate pain (pain score 4-6)., Disp: , Rfl:    levocetirizine (XYZAL ) 5 MG tablet, Take 1 tablet (5 mg total) by mouth every evening., Disp: 90 tablet, Rfl: 0   metFORMIN  (GLUCOPHAGE ) 500 MG tablet, Take 1 tablet (500 mg total) by mouth 2 (two) times daily with a meal., Disp: 90 tablet, Rfl: 3   omeprazole (PRILOSEC) 20 MG capsule, Take 20 mg by mouth daily., Disp: , Rfl:    pregabalin  (LYRICA ) 200 MG capsule, TAKE 1 CAPSULE BY MOUTH TWICE A DAY, Disp: 180 capsule, Rfl: 3   tadalafil (CIALIS) 5 MG tablet, Take 5 mg by mouth daily., Disp: , Rfl:    topiramate  (TOPAMAX ) 25 MG tablet, Take 1 tablet (25 mg total) by mouth daily as needed. (Patient taking differently: Take 25 mg by mouth daily.), Disp: 60 tablet, Rfl: 3   valsartan  (DIOVAN ) 160 MG tablet, TAKE 1 TABLET BY MOUTH EVERY DAY, Disp: 90  tablet, Rfl: 2   Physical Exam:   BP (!) 161/83 (BP Location: Right Arm, Patient Position: Sitting, Cuff Size: Large)   Pulse 71   Ht 5' 10 (1.778 m)   Wt 232 lb (105.2 kg)   SpO2 92%   BMI 33.29 kg/m   Salient findings:  CN II-XII intact No lesions of oral cavity/oropharynx; tonsils 1/1, normal in appearance No obviously palpable neck masses/lymphadenopathy/thyromegaly No respiratory distress or stridor  Seprately Identifiable Procedures:  Prior to initiating any procedures, risks/benefits/alternatives were explained to the patient and verbal consent obtained. Procedure Note (Prior, not today)  Pre-procedure diagnosis:  Dysphagia, GERD Post-procedure diagnosis: Same Procedure: Transnasal Fiberoptic Laryngoscopy, CPT 31575 - Mod 25 Indication: see above Complications: None apparent EBL: 0 mL  The procedure was undertaken to further evaluate the patient's complaint above, with mirror exam inadequate for appropriate examination due to gag reflex and poor patient tolerance  Procedure:  Patient was identified as correct patient. Verbal consent was obtained. The nose was sprayed with oxymetazoline and 4% lidocaine . The The flexible laryngoscope was passed through the nose to view the nasal cavity, pharynx (oropharynx, hypopharynx) and larynx.  The larynx was examined at rest and during multiple phonatory tasks. Documentation was obtained and reviewed with patient. The scope was removed. The patient tolerated the procedure well.  Findings: The nasal cavity and nasopharynx did not reveal any masses or lesions, mucosa appeared to be without obvious lesions. The tongue base, pharyngeal walls, piriform sinuses, vallecula, epiglottis and postcricoid region are normal in appearance without significant retained secretions. The visualized portion of the subglottis and proximal trachea is widely patent. The vocal folds are mobile bilaterally. There are no lesions on the free edge of the vocal folds  nor elsewhere in the larynx worrisome for malignancy.           Electronically signed by: Eldora KATHEE Blanch, MD 05/06/2024 9:19 AM   Impression & Plans:  Eliazar Olivar is a 78 y.o. male with:  1. Pharyngoesophageal dysphagia   2. Gastroesophageal reflux disease without esophagitis    Prior EGD recently without strictures. Mostly with solid foods/particular foods MBS and esophagram reassuring. Doing some better with improved hydration during eating. At this point, given prior dilation did not help, we opted to observe - GERD: stable, continue PPI; denies GERD sx    See below regarding exact medications prescribed this encounter including dosages and route: No orders of the defined types were placed in this encounter.     Thank you for allowing me the opportunity to care for your patient. Please do not hesitate to contact me should you have any other questions.  Sincerely, Eldora Blanch, MD Otolaryngologist (ENT), [facility] Phone: [phone] Fax: [phone]  05/06/2024, 9:19 AM   MDM:  Level 4 - 99214 Complexity/Problems addressed: mod - chronic problems, stable Data complexity: mod - independent interpretation of imaging - Morbidity: low currently  - Prescription Drug prescribed or managed: no

## 2024-06-21 ENCOUNTER — Emergency Department (HOSPITAL_COMMUNITY)
Admission: EM | Admit: 2024-06-21 | Discharge: 2024-07-05 | Disposition: E | Attending: Emergency Medicine | Admitting: Emergency Medicine

## 2024-06-21 DIAGNOSIS — I469 Cardiac arrest, cause unspecified: Secondary | ICD-10-CM | POA: Insufficient documentation

## 2024-06-21 DIAGNOSIS — Y9389 Activity, other specified: Secondary | ICD-10-CM | POA: Insufficient documentation

## 2024-06-21 DIAGNOSIS — I10 Essential (primary) hypertension: Secondary | ICD-10-CM | POA: Diagnosis not present

## 2024-06-21 DIAGNOSIS — W231XXA Caught, crushed, jammed, or pinched between stationary objects, initial encounter: Secondary | ICD-10-CM | POA: Insufficient documentation

## 2024-06-21 DIAGNOSIS — Z79899 Other long term (current) drug therapy: Secondary | ICD-10-CM | POA: Insufficient documentation

## 2024-06-21 DIAGNOSIS — E119 Type 2 diabetes mellitus without complications: Secondary | ICD-10-CM | POA: Insufficient documentation

## 2024-06-21 DIAGNOSIS — S88912A Complete traumatic amputation of left lower leg, level unspecified, initial encounter: Secondary | ICD-10-CM | POA: Diagnosis not present

## 2024-06-21 DIAGNOSIS — S8992XA Unspecified injury of left lower leg, initial encounter: Secondary | ICD-10-CM | POA: Diagnosis present

## 2024-06-21 DIAGNOSIS — Z7984 Long term (current) use of oral hypoglycemic drugs: Secondary | ICD-10-CM | POA: Insufficient documentation

## 2024-06-21 DIAGNOSIS — Z7982 Long term (current) use of aspirin: Secondary | ICD-10-CM | POA: Insufficient documentation

## 2024-06-21 DIAGNOSIS — R58 Hemorrhage, not elsewhere classified: Secondary | ICD-10-CM

## 2024-06-21 LAB — PROTIME-INR
INR: 2.2 — ABNORMAL HIGH (ref 0.8–1.2)
Prothrombin Time: 25.1 s — ABNORMAL HIGH (ref 11.4–15.2)

## 2024-06-21 LAB — CBC
HCT: 33.5 % — ABNORMAL LOW (ref 39.0–52.0)
Hemoglobin: 9.9 g/dL — ABNORMAL LOW (ref 13.0–17.0)
MCH: 30.1 pg (ref 26.0–34.0)
MCHC: 29.6 g/dL — ABNORMAL LOW (ref 30.0–36.0)
MCV: 101.8 fL — ABNORMAL HIGH (ref 80.0–100.0)
Platelets: 62 K/uL — ABNORMAL LOW (ref 150–400)
RBC: 3.29 MIL/uL — ABNORMAL LOW (ref 4.22–5.81)
RDW: 13.5 % (ref 11.5–15.5)
WBC: 2.3 K/uL — ABNORMAL LOW (ref 4.0–10.5)
nRBC: 1.3 % — ABNORMAL HIGH (ref 0.0–0.2)

## 2024-06-21 LAB — I-STAT CHEM 8, ED
BUN: 15 mg/dL (ref 8–23)
Calcium, Ion: 0.76 mmol/L — CL (ref 1.15–1.40)
Chloride: 114 mmol/L — ABNORMAL HIGH (ref 98–111)
Creatinine, Ser: 1.8 mg/dL — ABNORMAL HIGH (ref 0.61–1.24)
Glucose, Bld: 99 mg/dL (ref 70–99)
HCT: 26 % — ABNORMAL LOW (ref 39.0–52.0)
Hemoglobin: 8.8 g/dL — ABNORMAL LOW (ref 13.0–17.0)
Potassium: 5.5 mmol/L — ABNORMAL HIGH (ref 3.5–5.1)
Sodium: 155 mmol/L — ABNORMAL HIGH (ref 135–145)
TCO2: 15 mmol/L — ABNORMAL LOW (ref 22–32)

## 2024-06-21 LAB — COMPREHENSIVE METABOLIC PANEL WITH GFR
ALT: 97 U/L — ABNORMAL HIGH (ref 0–44)
AST: 103 U/L — ABNORMAL HIGH (ref 15–41)
Albumin: 2.9 g/dL — ABNORMAL LOW (ref 3.5–5.0)
Alkaline Phosphatase: 53 U/L (ref 38–126)
Anion gap: 33 — ABNORMAL HIGH (ref 5–15)
BUN: 15 mg/dL (ref 8–23)
CO2: 11 mmol/L — ABNORMAL LOW (ref 22–32)
Calcium: 8.6 mg/dL — ABNORMAL LOW (ref 8.9–10.3)
Chloride: 114 mmol/L — ABNORMAL HIGH (ref 98–111)
Creatinine, Ser: 1.9 mg/dL — ABNORMAL HIGH (ref 0.61–1.24)
GFR, Estimated: 36 mL/min — ABNORMAL LOW (ref >60)
Glucose, Bld: 106 mg/dL — ABNORMAL HIGH (ref 70–99)
Potassium: 5.8 mmol/L — ABNORMAL HIGH (ref 3.5–5.1)
Sodium: 158 mmol/L — ABNORMAL HIGH (ref 135–145)
Total Bilirubin: <0.2 mg/dL (ref 0.0–1.2)
Total Protein: 4.2 g/dL — ABNORMAL LOW (ref 6.5–8.1)

## 2024-06-21 LAB — PREPARE RBC (CROSSMATCH)

## 2024-06-21 LAB — ETHANOL: Alcohol, Ethyl (B): <15 mg/dL (ref <15)

## 2024-06-21 MED ORDER — SODIUM CHLORIDE 0.9% IV SOLUTION: Freq: Once | INTRAVENOUS | Status: DC

## 2024-06-22 LAB — TYPE AND SCREEN
ABO/RH(D): B POS
Antibody Screen: NEGATIVE
Unit division: 0
Unit division: 0

## 2024-06-22 LAB — BPAM RBC
Blood Product Expiration Date: 202511102359
Blood Product Expiration Date: 202511142359
ISSUE DATE / TIME: 202510181532
ISSUE DATE / TIME: 202510181532
Unit Type and Rh: 9500
Unit Type and Rh: 9500

## 2024-07-05 NOTE — ED Notes (Addendum)
 Called on call ME to see about when he will arrive to view this pt. Per ME he will come in the morning and to send body to the morgue. RN made aware

## 2024-07-05 NOTE — ED Provider Notes (Signed)
 [place] EMERGENCY DEPARTMENT AT [facility] Provider Note   CSN: [id number] Arrival date & time: 01-Jul-2024  1528     Patient presents with: No chief complaint on file.   Robert Page is a 78 y.o. male.   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  By the time the paramedics arrived he had lost a significant amount of blood, they placed a tourniquet and gave 1 unit of packed red blood cells which is all they had.  He was transported to the hospital with approximately 40 minutes of CPR prehospital.  On arrival the patient is pulseless apneic and not able to give any information.  Prior to Admission medications   Medication Sig Start Date End Date Taking? Authorizing Provider  amLODipine  (NORVASC ) 5 MG tablet Take 1 tablet (5 mg total) by mouth daily. 01/03/24   Duanne Butler DASEN, MD  aspirin EC 81 MG tablet Take 81 mg by mouth daily.    [provider]  DM-Doxylamine-Acetaminophen  (NYQUIL COLD & FLU PO) Take 1 Dose by mouth every 4 (four) hours as needed (Cough/Cold).    [provider]  DULoxetine  (CYMBALTA ) 60 MG capsule TAKE 1 CAPSULE BY MOUTH EVERY DAY 04/07/24   Raulkar, Sven SQUIBB, MD  ezetimibe  (ZETIA ) 10 MG tablet Take 1 tablet (10 mg total) by mouth daily. 07/17/23 05/06/24  Lelon Hamilton T, PA-C  ibuprofen (ADVIL) 200 MG tablet Take 400 mg by mouth every 6 (six) hours as needed for moderate pain (pain score 4-6).    [provider]  levocetirizine (XYZAL ) 5 MG tablet Take 1 tablet (5 mg total) by mouth every evening. 01/03/24   Duanne Butler DASEN, MD  metFORMIN  (GLUCOPHAGE ) 500 MG tablet Take 1  tablet (500 mg total) by mouth 2 (two) times daily with a meal. 01/15/24   Raulkar, Sven SQUIBB, MD  omeprazole (PRILOSEC) 20 MG capsule Take 20 mg by mouth daily.    [provider]  pregabalin  (LYRICA ) 200 MG capsule TAKE 1 CAPSULE BY MOUTH TWICE A DAY 01/02/24   Raulkar, Sven SQUIBB, MD  tadalafil (CIALIS) 5 MG tablet Take 5 mg by mouth daily. 08/11/22   [provider]  topiramate  (TOPAMAX ) 25 MG tablet Take 1 tablet (25 mg total) by mouth daily as needed. Patient taking differently: Take 25 mg by mouth daily. 10/08/23   Raulkar, Sven SQUIBB, MD  valsartan  (DIOVAN ) 160 MG tablet TAKE 1 TABLET BY MOUTH EVERY DAY 01/01/24   Duanne Butler DASEN, MD    Allergies: Sulfamethoxazole    Review of Systems  Unable to perform ROS: Patient unresponsive    Updated Vital Signs There were no vitals taken for this visit.  Physical Exam Vitals and nursing note reviewed.  Constitutional:      Appearance: He is well-developed. He is toxic-appearing.  HENT:     Head: Normocephalic and atraumatic.     Mouth/Throat:     Pharynx: No oropharyngeal exudate.     Comments: Mouthful of vomit Eyes:     General: No scleral icterus.       Right eye: No discharge.  Left eye: No discharge.     Conjunctiva/sclera: Conjunctivae normal.     Pupils: Pupils are equal, round, and reactive to light.  Neck:     Thyroid : No thyromegaly.     Vascular: No JVD.  Cardiovascular:     Heart sounds: Normal heart sounds. No murmur heard.    No friction rub. No gallop.     Comments: Pulseless Pulmonary:     Effort: Respiratory distress present.     Comments: No spontaneous respirations, bag-valve-mask being given Abdominal:     General: Bowel sounds are normal. There is no distension.     Palpations: Abdomen is soft. There is no mass.     Tenderness: There is no abdominal tenderness.  Musculoskeletal:        General: Deformity and signs of injury present.     Cervical back: Normal range of motion and  neck supple.     Right lower leg: No edema.     Comments: Left lower extremity with tibia exposed, complete tibiofibular amputation of the lower extremity  Lymphadenopathy:     Cervical: No cervical adenopathy.  Skin:    General: Skin is warm.     Findings: No erythema or rash.  Psychiatric:        Behavior: Behavior normal.     (all labs ordered are listed, but only abnormal results are displayed) Labs Reviewed  COMPREHENSIVE METABOLIC PANEL WITH GFR  CBC  ETHANOL  PROTIME-INR  I-STAT CHEM 8, ED  TYPE AND SCREEN  SAMPLE TO BLOOD BANK    EKG: None  Radiology: No results found.   Procedure Name: Intubation Date/Time: July 01, 2024 3:56 PM  Performed by: Cleotilde Rogue, MDPre-anesthesia Checklist: Patient identified, Patient being monitored, Emergency Drugs available, Timeout performed and Suction available Oxygen Delivery Method: Non-rebreather mask Preoxygenation: Pre-oxygenation with 100% oxygen Induction Type: Rapid sequence Ventilation: Mask ventilation without difficulty Laryngoscope Size: Glidescope and 4 Tube size: 7.5 mm Number of attempts: 1 Airway Equipment and Method: Stylet and Video-laryngoscopy Placement Confirmation: ETT inserted through vocal cords under direct vision, CO2 detector and Breath sounds checked- equal and bilateral Secured at: 24 cm Tube secured with: ETT holder Dental Injury: Teeth and Oropharynx as per pre-operative assessment  Difficulty Due To: Difficulty was unanticipated Comments:      CPR  Date/Time: 2024-07-01 3:57 PM  Performed by: Cleotilde Rogue, MD Authorized by: Cleotilde Rogue, MD  CPR Procedure Details:      Amount of time prior to administration of ACLS/BLS (minutes):  20   ACLS/BLS initiated by EMS: Yes     CPR/ACLS performed in the ED: Yes     Duration of CPR (minutes):  20  CPR performed via ACLS guidelines under my direct supervision.  See RN documentation for details including defibrillator use, medications, doses  and timing. Comments:       Central Line  Date/Time: 01-Jul-2024 3:57 PM  Performed by: Cleotilde Rogue, MD Authorized by: Cleotilde Rogue, MD   Consent:    Consent obtained:  Emergent situation Pre-procedure details:    Indication(s): central venous access and insufficient peripheral access     Hand hygiene: Hand hygiene performed prior to insertion     Sterile barrier technique: All elements of maximal sterile technique followed     Skin preparation:  Chlorhexidine   Skin preparation agent: Skin preparation agent completely dried prior to procedure   Sedation:    Sedation type:  None Anesthesia:    Anesthesia method:  None Procedure details:    Location:  R internal jugular   Patient position:  Supine   Procedural supplies:  Triple lumen   Catheter size:  7 Fr   Landmarks identified: yes     Ultrasound guidance: no     Number of attempts:  1   Successful placement: yes   Post-procedure details:    Post-procedure:  Dressing applied and line sutured   Assessment:  Blood return through all ports and free fluid flow   Procedure completion:  Tolerated well, no immediate complications Comments:          Medications Ordered in the ED - No data to display                                  Medical Decision Making Amount and/or Complexity of Data Reviewed Radiology: ordered.   This patient presents an extremis, he has been down for approximately 40 minutes, and additional 20 minutes of CPR was given under my direction, I placed a central line in his right internal jugular vein and intubated the patient with glide scope.  Despite this, 2 units of blood and multiple doses of epinephrine the patient remained pulseless and apneic.  Time of death 3:47 PM, this patient had unsurvivable traumatic amputation with significant exsanguination at the scene.  I made the family aware and contacted the medical examiner.  The medical examiner will see him either tonight or tomorrow morning in the  morgue     Final diagnoses:  Amputation, leg, traumatic, left, initial encounter [facility])  Cardiac arrest (HCC)  Exsanguination     Cleotilde Rogue, MD 2024/07/01 1559

## 2024-07-05 NOTE — ED Notes (Signed)
 1530 CPR in progress via [place]. BVM used.  PEA rhythm 1534:  2 unit PRBC's emergency blood administered via pressure bag, air way established by Dr Cleotilde (ET Tube) 1536: 1 of epi given 1538: 1 of epi given 1538: Rhythm check- asystole 1538 7 F Right internal jugular triple Lumen access by Dr Cleotilde 1542 1 of epi 1546:1 of epi   Time of Death: 3:47 PM announced by Dr Cleotilde.  ME case.  Kellogg RN

## 2024-07-05 NOTE — ED Notes (Addendum)
 Pt arrived via [facility] (physical sciences) care with CPR in progress, [place] in place and  performing compressions.  Bag Valve mask being utilized upon arrival. Per EMS CPR had been ongoing x 40 min prior to arrival.Accident occurred about 1 hour prior to arrival. 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 Left leg amputation below the knee present, Tourniquet in place. Estimated blood loss 1.5 Liters. NO ROSC achieved thus far. 1 Unit of PRBC's given en route, 6 of epi, 50ml of bicarb given prior to arrival.  R lower leg IO present. Kellogg RN

## 2024-07-05 NOTE — ED Notes (Signed)
 Emergency Blood transfusion of O Rh- administered. 2 Units. Details below.  Unit # [id number] Expiration 07/14/2024   2359  Unit #[id number] K Expiration: 07/18/2024  2359  Miaya Lafontant RN

## 2024-07-05 NOTE — Progress Notes (Signed)
   2024-06-25 1700  Spiritual Encounters  Type of Visit Initial  Care provided to: Family  Referral source Other (comment) [place]ol[place])  Reason for visit Patient death  OnCall Visit Yes   Chaplain responded to a call for support of the family.  Family left before I arrived.  I understand that their pastor came to support them.  Carley Birmingham [facility]  [phone]

## 2024-07-05 DEATH — deceased

## 2024-08-08 ENCOUNTER — Other Ambulatory Visit: Payer: Self-pay | Admitting: Physical Medicine and Rehabilitation

## 2024-10-13 ENCOUNTER — Encounter: Payer: Medicare Other | Admitting: Family Medicine

## 2024-10-17 ENCOUNTER — Ambulatory Visit: Admitting: Physical Medicine and Rehabilitation

## 2025-04-23 ENCOUNTER — Ambulatory Visit
